# Patient Record
Sex: Male | Born: 1960
Health system: Southern US, Community
[De-identification: ages and names within clinical notes are randomized; demographics above are authoritative.]

## PROBLEM LIST (undated history)

## (undated) DIAGNOSIS — S8990XA Unspecified injury of unspecified lower leg, initial encounter: Secondary | ICD-10-CM

## (undated) DIAGNOSIS — H269 Unspecified cataract: Secondary | ICD-10-CM

## (undated) DIAGNOSIS — E7800 Pure hypercholesterolemia, unspecified: Secondary | ICD-10-CM

## (undated) DIAGNOSIS — I10 Essential (primary) hypertension: Secondary | ICD-10-CM

## (undated) DIAGNOSIS — R011 Cardiac murmur, unspecified: Secondary | ICD-10-CM

## (undated) DIAGNOSIS — B019 Varicella without complication: Secondary | ICD-10-CM

## (undated) DIAGNOSIS — T753XXA Motion sickness, initial encounter: Secondary | ICD-10-CM

## (undated) DIAGNOSIS — E1144 Type 2 diabetes mellitus with diabetic amyotrophy: Secondary | ICD-10-CM

## (undated) DIAGNOSIS — K259 Gastric ulcer, unspecified as acute or chronic, without hemorrhage or perforation: Secondary | ICD-10-CM

## (undated) DIAGNOSIS — G629 Polyneuropathy, unspecified: Secondary | ICD-10-CM

## (undated) DIAGNOSIS — E119 Type 2 diabetes mellitus without complications: Secondary | ICD-10-CM

## (undated) HISTORY — DX: Cardiac murmur, unspecified: R01.1

## (undated) HISTORY — PX: OTHER SURGICAL HISTORY: SHX169

## (undated) HISTORY — DX: Type 2 diabetes mellitus with diabetic amyotrophy: E11.44

## (undated) HISTORY — DX: Unspecified cataract: H26.9

## (undated) HISTORY — DX: Varicella without complication: B01.9

## (undated) HISTORY — DX: Gastric ulcer, unspecified as acute or chronic, without hemorrhage or perforation: K25.9

## (undated) HISTORY — DX: Essential (primary) hypertension: I10

---

## 2006-02-03 ENCOUNTER — Emergency Department (HOSPITAL_COMMUNITY): Admission: EM | Admit: 2006-02-03 | Discharge: 2006-02-03 | Payer: Self-pay | Admitting: Emergency Medicine

## 2016-01-19 ENCOUNTER — Encounter: Payer: Self-pay | Admitting: Family Medicine

## 2016-01-19 ENCOUNTER — Ambulatory Visit (INDEPENDENT_AMBULATORY_CARE_PROVIDER_SITE_OTHER): Payer: Self-pay | Admitting: Family Medicine

## 2016-01-19 VITALS — BP 154/86 | HR 93 | Temp 98.2°F | Ht 70.0 in | Wt 169.6 lb

## 2016-01-19 DIAGNOSIS — I10 Essential (primary) hypertension: Secondary | ICD-10-CM | POA: Insufficient documentation

## 2016-01-19 DIAGNOSIS — R030 Elevated blood-pressure reading, without diagnosis of hypertension: Secondary | ICD-10-CM

## 2016-01-19 DIAGNOSIS — IMO0001 Reserved for inherently not codable concepts without codable children: Secondary | ICD-10-CM

## 2016-01-19 DIAGNOSIS — H269 Unspecified cataract: Secondary | ICD-10-CM

## 2016-01-19 DIAGNOSIS — M25562 Pain in left knee: Secondary | ICD-10-CM

## 2016-01-19 LAB — COMPREHENSIVE METABOLIC PANEL
ALT: 13 U/L (ref 0–53)
AST: 12 U/L (ref 0–37)
Albumin: 4.3 g/dL (ref 3.5–5.2)
Alkaline Phosphatase: 114 U/L (ref 39–117)
BUN: 13 mg/dL (ref 6–23)
CO2: 29 mEq/L (ref 19–32)
Calcium: 9.5 mg/dL (ref 8.4–10.5)
Chloride: 99 mEq/L (ref 96–112)
Creatinine, Ser: 0.61 mg/dL (ref 0.40–1.50)
GFR: 177.87 mL/min (ref >60.00)
Glucose, Bld: 195 mg/dL — ABNORMAL HIGH (ref 70–99)
Potassium: 4 mEq/L (ref 3.5–5.1)
Sodium: 137 mEq/L (ref 135–145)
Total Bilirubin: 0.7 mg/dL (ref 0.2–1.2)
Total Protein: 7.3 g/dL (ref 6.0–8.3)

## 2016-01-19 LAB — LIPID PANEL
Cholesterol: 197 mg/dL (ref 0–200)
HDL: 45 mg/dL (ref >39.00)
LDL Cholesterol: 130 mg/dL — ABNORMAL HIGH (ref 0–99)
NonHDL: 151.63
Total CHOL/HDL Ratio: 4
Triglycerides: 110 mg/dL (ref 0.0–149.0)
VLDL: 22 mg/dL (ref 0.0–40.0)

## 2016-01-19 LAB — CBC
HCT: 47.9 % (ref 39.0–52.0)
Hemoglobin: 16.3 g/dL (ref 13.0–17.0)
MCHC: 33.9 g/dL (ref 30.0–36.0)
MCV: 86.1 fl (ref 78.0–100.0)
Platelets: 209 10*3/uL (ref 150.0–400.0)
RBC: 5.57 Mil/uL (ref 4.22–5.81)
RDW: 12.7 % (ref 11.5–15.5)
WBC: 6 10*3/uL (ref 4.0–10.5)

## 2016-01-19 LAB — TSH: TSH: 1.68 u[IU]/mL (ref 0.35–4.50)

## 2016-01-19 LAB — HEMOGLOBIN A1C: Hgb A1c MFr Bld: 8.8 % — ABNORMAL HIGH (ref 4.6–6.5)

## 2016-01-19 NOTE — Assessment & Plan Note (Signed)
Patient is being followed by ophthalmology for this. Surgery is planned for next month.

## 2016-01-19 NOTE — Patient Instructions (Signed)
Nice to meet you. We will refer you to sports medicine for evaluation of your left knee. We will call you when this is scheduled. We will have a follow-up in a week to check your blood pressure. We'll check some lab work today as well. Please keep the appointment for your cataract surgery. If you develop worsening pain in her knee or leg, numbness, weakness, chest pain, shortness of breath, vision changes, or any new or changing symptoms please seek medical attention.

## 2016-01-19 NOTE — Assessment & Plan Note (Signed)
Patient with persistent left knee pain status post fall resulting in injury. He has apparent weakness of his left quadriceps muscle and left hip flexors through certain ranges of motion making me wonder if there is some sort of muscle tear in his left quadriceps. He has no other neurological issues. He denies back pain or back injury. His knee appears to be ligamentously intact. I discussed given the exam findings that it would be worthwhile having him see a sports medicine physician who can perform an ultrasound to evaluate this issue further. We will refer to sports medicine and try to get him in later this week. He is given return precautions.

## 2016-01-19 NOTE — Assessment & Plan Note (Signed)
Patient with elevated blood pressure in the office today. He is asymptomatic. We will check lab work today. We will request records from his recent ophthalmology visit to see if his blood pressure was indeed elevated at that time. If it was we will start him on medication. If it was not we will have him return in 1 week for a nurse visit for blood pressure check and if elevated at that time we will start him on blood pressure medication. Given return precautions.

## 2016-01-19 NOTE — Progress Notes (Signed)
Patient ID: Michael Montgomery, male   DOB: 18-Jan-1961, 55 y.o.   MRN: [id number]  [name], MD Phone: [phone]  Michael Montgomery is a 55 y.o. male who presents today for new patient visit.  Left knee pain: Patient notes about a month ago he slipped while he was walking and his left knee went into a valgus direction and curled under his weight. He notes since that time the front of his knee has hurt as well as his distal quadriceps. Notes the knee will occasionally swell. Hurts the most when he is laying down. No prior injury to his knee. No pop, locking, or catching of his knee. He tried Tylenol and that has not helped very much. He notes no numbness in his leg. He notes difficulty fully extending his knee due to pain and possibly decreased strength in his left quad.  Elevated blood pressure: Patient notes in the past he's had high blood pressure. Recently was elevated at a ophthalmology appointment. He has not previously been on medications. He notes elevated blood pressure runs in his family. No chest pain, shortness breath, or edema.  Cataracts: Patient notes he is seen an ophthalmologist at [facility] ophthalmology. Notes he is scheduled to have cataract removal on April 10. Notes he has had trouble seeing for about a year. No eye pain. He is not driving due to his trouble seeing.  Active Ambulatory Problems    Diagnosis Date Noted  . Elevated blood pressure 01/19/2016  . Cataracts, bilateral 01/19/2016  . Left knee pain 01/19/2016   Resolved Ambulatory Problems    Diagnosis Date Noted  . No Resolved Ambulatory Problems   Past Medical History  Diagnosis Date  . Chickenpox   . Gastric ulcer   . Heart murmur   . High blood pressure     Family History  Problem Relation Age of Onset  . Alcoholism Father   . Hyperlipidemia Father   . Hypertension Father   . Diabetes Mother     Social History   Social History  . Marital Status: Single    Spouse Name: N/A  . Number of  Children: N/A  . Years of Education: N/A   Occupational History  . Not on file.   Social History Main Topics  . Smoking status: Current Some Day Smoker    Types: Cigars  . Smokeless tobacco: Not on file  . Alcohol Use: 0.0 oz/week    0 Standard drinks or equivalent per week  . Drug Use: No  . Sexual Activity: Not on file   Other Topics Concern  . Not on file   Social History Narrative  . No narrative on file    ROS   General:  Negative for nexplained weight loss, fever Skin: Negative for new or changing mole, sore that won't heal HEENT: Negative for trouble hearing, trouble seeing, ringing in ears, mouth sores, hoarseness, change in voice, dysphagia. CV:  Negative for chest pain, dyspnea, edema, palpitations Resp: Negative for cough, dyspnea, hemoptysis GI: Negative for nausea, vomiting, diarrhea, constipation, abdominal pain, melena, hematochezia. GU: Positive for frequent urination, sexual difficulty, Negative for dysuria, incontinence, urinary hesitance, hematuria, vaginal or penile discharge, polyuria, lumps in testicle or breasts MSK: Positive for left knee pain, otherwise Negative for muscle cramps or aches, joint pain or swelling Neuro: Negative for headaches, weakness, numbness, dizziness, passing out/fainting Psych: Negative for depression, anxiety, memory problem, positive for stress  Objective  Physical Exam Filed Vitals:   01/19/16 1029  BP: 154/86  Pulse: 93  Temp: 98.2 F (36.8 C)    BP Readings from Last 3 Encounters:  01/19/16 154/86   Wt Readings from Last 3 Encounters:  01/19/16 169 lb 9.6 oz (76.93 kg)    Physical Exam  Constitutional: He is well-developed, well-nourished, and in no distress.  HENT:  Head: Normocephalic and atraumatic.  Right Ear: External ear normal.  Left Ear: External ear normal.  Mouth/Throat: Oropharynx is clear and moist. No oropharyngeal exudate.  Eyes: Conjunctivae are normal. Pupils are equal, round, and reactive  to light.  Neck: Neck supple.  Cardiovascular: Normal rate, regular rhythm and normal heart sounds.  Exam reveals no gallop.   No murmur heard. Pulmonary/Chest: Effort normal and breath sounds normal. No respiratory distress. He has no wheezes. He has no rales.  Abdominal: Soft. Bowel sounds are normal. He exhibits no distension. There is no tenderness. There is no rebound and no guarding.  Musculoskeletal:  Left knee with potentially mild swelling, no joint line tenderness or tenderness of the bony structures, no tenderness of the quadriceps insertion, no ligamentous laxity, there is pain on McMurray's , there is no bruising of the quadriceps muscle Right knee with no swelling, joint line tenderness, bony tenderness, quadriceps tenderness, or ligamentous laxity, negative McMurray's  Lymphadenopathy:    He has no cervical adenopathy.  Neurological: He is alert.  4 out of 5 strength through the first 20-30 of his left quadriceps strength and left hip flexors from seated position, with attempt at further range of motion actively he is unable to get past the first 20-30 of these 2 movements actively, otherwise 5/5 strength in right quads, right hip flexors, bilateral hamstrings, plantar and dorsiflexion, sensation to light touch intact in bilateral UE and LE, normal gait, 2+ patellar reflexes  Skin: Skin is warm and dry. He is not diaphoretic.  Psychiatric: Mood and affect normal.     Assessment/Plan:   Elevated blood pressure Patient with elevated blood pressure in the office today. He is asymptomatic. We will check lab work today. We will request records from his recent ophthalmology visit to see if his blood pressure was indeed elevated at that time. If it was we will start him on medication. If it was not we will have him return in 1 week for a nurse visit for blood pressure check and if elevated at that time we will start him on blood pressure medication. Given return  precautions.  Cataracts, bilateral Patient is being followed by ophthalmology for this. Surgery is planned for next month.   Left knee pain Patient with persistent left knee pain status post fall resulting in injury. He has apparent weakness of his left quadriceps muscle and left hip flexors through certain ranges of motion making me wonder if there is some sort of muscle tear in his left quadriceps. He has no other neurological issues. He denies back pain or back injury. His knee appears to be ligamentously intact. I discussed given the exam findings that it would be worthwhile having him see a sports medicine physician who can perform an ultrasound to evaluate this issue further. We will refer to sports medicine and try to get him in later this week. He is given return precautions.    Orders Placed This Encounter  Procedures  . Comp Met (CMET)  . Lipid Profile  . HgB A1c  . TSH  . CBC  . Ambulatory referral to Sports Medicine    Referral Priority:  Routine    Referral Type:  Consultation    Number of Visits Requested:  1    No orders of the defined types were placed in this encounter.     [name], MD [place]

## 2016-01-19 NOTE — Progress Notes (Signed)
Pre visit review using our clinic review tool, if applicable. No additional management support is needed unless otherwise documented below in the visit note. 

## 2016-01-21 ENCOUNTER — Encounter: Payer: Self-pay | Admitting: Family Medicine

## 2016-01-21 ENCOUNTER — Ambulatory Visit (INDEPENDENT_AMBULATORY_CARE_PROVIDER_SITE_OTHER)
Admission: RE | Admit: 2016-01-21 | Discharge: 2016-01-21 | Disposition: A | Discharge status: Home / Self Care | Payer: BLUE CROSS/BLUE SHIELD | Source: Ambulatory Visit | Attending: Family Medicine | Admitting: Family Medicine

## 2016-01-21 ENCOUNTER — Other Ambulatory Visit (INDEPENDENT_AMBULATORY_CARE_PROVIDER_SITE_OTHER): Payer: Self-pay

## 2016-01-21 ENCOUNTER — Ambulatory Visit (INDEPENDENT_AMBULATORY_CARE_PROVIDER_SITE_OTHER): Payer: Self-pay | Admitting: Family Medicine

## 2016-01-21 VITALS — BP 134/88 | HR 97 | Ht 70.0 in | Wt 170.0 lb

## 2016-01-21 DIAGNOSIS — M62569 Muscle wasting and atrophy, not elsewhere classified, unspecified lower leg: Secondary | ICD-10-CM | POA: Insufficient documentation

## 2016-01-21 DIAGNOSIS — M25562 Pain in left knee: Secondary | ICD-10-CM | POA: Diagnosis not present

## 2016-01-21 DIAGNOSIS — M62562 Muscle wasting and atrophy, not elsewhere classified, left lower leg: Secondary | ICD-10-CM

## 2016-01-21 DIAGNOSIS — R29898 Other symptoms and signs involving the musculoskeletal system: Secondary | ICD-10-CM

## 2016-01-21 MED ORDER — PREDNISONE 50 MG PO TABS: 50.0000 mg | ORAL_TABLET | Freq: Every day | ORAL | Status: DC

## 2016-01-21 NOTE — Progress Notes (Signed)
Pre visit review using our clinic review tool, if applicable. No additional management support is needed unless otherwise documented below in the visit note. 

## 2016-01-21 NOTE — Progress Notes (Signed)
[facility] [place], [place] [address] Phone: [phone] Subjective:    I'm seeing this patient by the request  of:  [name], MD   CC: Left leg pain and weakness  [id number] Michael Montgomery is a 55 y.o. male coming in with complaint of left leg pain and weakness. Patient states approximate one month ago he was walking and he tripped on his carpet. Patient feel that his leg and knee went into a valgus direction any landed on top of his leg causing to be folded up behind him. Patient states within one day he had severe amount of pain on the anterior aspect of his leg. Patient had significant swelling that seems to be from the knee up to the quadricep. Patient states any type of ambulation or weightbearing causes severe pain at this time. Patient denies any catching or locking but states that he feels like it is getting weaker. Ambulate with the aid of a cane. Denies any numbness. Patient states that he is unable to extend his leg anymore at this time.     Past Medical History  Diagnosis Date  . Chickenpox   . Gastric ulcer   . Heart murmur     When he was younger  . High blood pressure    No past surgical history on file. Social History   Social History  . Marital Status: Single    Spouse Name: N/A  . Number of Children: N/A  . Years of Education: N/A   Social History Main Topics  . Smoking status: Current Some Day Smoker    Types: Cigars  . Smokeless tobacco: None  . Alcohol Use: 0.0 oz/week    0 Standard drinks or equivalent per week  . Drug Use: No  . Sexual Activity: Not Asked   Other Topics Concern  . None   Social History Narrative   No Known Allergies Family History  Problem Relation Age of Onset  . Alcoholism Father   . Hyperlipidemia Father   . Hypertension Father   . Diabetes Mother     Past medical history, social, surgical and family history all reviewed in electronic medical record.  No  pertanent information unless stated regarding to the chief complaint.   Review of Systems: No headache, visual changes, nausea, vomiting, diarrhea, constipation, dizziness, abdominal pain, skin rash, fevers, chills, night sweats, weight loss, swollen lymph nodes, body aches, joint swelling, muscle aches, chest pain, shortness of breath, mood changes.   Objective Blood pressure 134/88, pulse 97, height 5\' 10"  (1.778 m), weight 170 lb (77.111 kg), SpO2 97 %.  General: No apparent distress alert and oriented x3 mood and affect normal, dressed appropriately.  HEENT: Pupils equal, extraocular movements intact  Respiratory: Patient's speak in full sentences and does not appear short of breath  Cardiovascular: No lower extremity edema, non tender, no erythema  Skin: Warm dry intact with no signs of infection or rash on extremities or on axial skeleton.  Abdomen: Soft nontender  Neuro: Cranial nerves II through XII are intact, neurovascularly intact in all extremities with 2+ DTRs and 2+ pulses.  Lymph: No lymphadenopathy of posterior or anterior cervical chain or axillae bilaterally.  Gait severely antalgic gait patient shows weakness with standing. MSK:  Non tender with full range of motion and good stability and symmetric strength and tone of shoulders, elbows, wrist, hip, and ankles bilaterally.  Knee: Left Patient has significant atrophy of the left leg compared to the  right leg mostly of the femur as well as the calf muscle. Patient appears to be neurovascularly intact. Patient has no extensor mechanism noted. Generalized tenderness of the femur itself. No defect noted of the quadricep. Quadricep tendon as well as patellar tendon seems to be intact. Patella is midline. Ligaments with solid consistent endpoints including ACL, PCL, LCL, MCL.  MSK US performed of: Left knee This study was ordered, performed, and interpreted by Charlann Boxer D.O.  Knee: All structures visualized. Mild degenerative  changes noted of the meniscus but no true acute tear or displacement noted Patellar Tendon unremarkable on long and transverse views without effusion. August the tendon appears to be intact as well as. Patient does not have a sign of a quadricep tear that can be appreciated at this time. Mild atrophy of the muscle. No abnormality of prepatellar bursa. LCL and MCL unremarkable on long and transverse views. No abnormality of origin of medial or lateral head of the gastrocnemius.  IMPRESSION:  Quadricep and patellar tendon appear to be intact with some mild atrophy of the quadricep.     Impression and Recommendations:     This case required medical decision making of moderate complexity.      Note: This dictation was prepared with Dragon dictation along with smaller phrase technology. Any transcriptional errors that result from this process are unintentional.

## 2016-01-21 NOTE — Assessment & Plan Note (Signed)
Trace effusion of the knee noted. Likely this is more from compensation for using the knee. Patient though has weakness of the extensor mechanism. Does have full strength with flexion. Patient was also having weakness at the ankle. I think that this is more neurologic. X-rays to rule out any other bony abnormality. Patient started on prednisone and wants to hold on the gabapentin at this time. Depending on results we will see what will be the next treatment options and the week.

## 2016-01-21 NOTE — Patient Instructions (Signed)
Good to see you  I am not sure what is going on at this time.  Stay walking but careful.  We need xrays of your knee, back an dleg today  I need to see you again in 1 week to make sure we are making progress or we may need more imaging Show up front: OK to double book.

## 2016-01-21 NOTE — Assessment & Plan Note (Signed)
Patient does have muscle wasting noted. Quadricep tendon seems to be intact. Patient though is having difficulty even lifting his leg.  Think that this could be more neurologic the musculature based on exam today. X-rays the patient's back, femur, and knee are all taken today. Patient given prednisone to see if he responds. I do think that advance imaging may be warranted as well as an EMG for neurologic aspect. I do not see any signs of an avascular necrosis. Patient's only has weakness of the lower extremity. No signs of any type of stroke the patient does have a history of hypertension. We will continue to monitor. Patient then will come back in the next week and we will further evaluate and see what would be the next regimen based on him and his response to treatment. Patient will be out of work for another week.

## 2016-01-22 ENCOUNTER — Other Ambulatory Visit: Payer: Self-pay | Admitting: Family Medicine

## 2016-01-22 ENCOUNTER — Telehealth: Payer: Self-pay | Admitting: Family Medicine

## 2016-01-22 MED ORDER — METFORMIN HCL 500 MG PO TABS: 500.0000 mg | ORAL_TABLET | Freq: Two times a day (BID) | ORAL | Status: DC

## 2016-01-22 MED ORDER — ATORVASTATIN CALCIUM 40 MG PO TABS: 40.0000 mg | ORAL_TABLET | Freq: Every day | ORAL | Status: DC

## 2016-01-26 ENCOUNTER — Telehealth: Payer: Self-pay | Admitting: *Deleted

## 2016-01-26 ENCOUNTER — Ambulatory Visit (INDEPENDENT_AMBULATORY_CARE_PROVIDER_SITE_OTHER): Payer: BLUE CROSS/BLUE SHIELD

## 2016-01-26 VITALS — BP 144/78 | HR 104

## 2016-01-26 DIAGNOSIS — E119 Type 2 diabetes mellitus without complications: Secondary | ICD-10-CM

## 2016-01-26 DIAGNOSIS — I10 Essential (primary) hypertension: Secondary | ICD-10-CM | POA: Diagnosis not present

## 2016-01-26 MED ORDER — AMLODIPINE BESYLATE 5 MG PO TABS: 5.0000 mg | ORAL_TABLET | Freq: Every day | ORAL | Status: DC

## 2016-01-26 NOTE — Telephone Encounter (Signed)
Patient is currently on Viagra, please advise for prescription.   Please advise on referral for podiatry.    Thanks

## 2016-01-26 NOTE — Progress Notes (Signed)
Patient came in for a BP check. This was preformed on both arms. Patient tolerated well. Patient is not on medication for BP.

## 2016-01-26 NOTE — Telephone Encounter (Signed)
Patient stated that he's a diabetic and would like to know of a place to get his toenails clipped. He would also like to have a prescription for Viagra.  Pharmacy [facility] [place]  Pt contact [phone]

## 2016-01-26 NOTE — Addendum Note (Signed)
Addended by: Caryl Bis ERIC G on: 01/26/2016 12:29 PM   Modules accepted: Orders, Level of Service

## 2016-01-26 NOTE — Progress Notes (Signed)
Patient informed of medication.

## 2016-01-26 NOTE — Progress Notes (Signed)
Patient's blood pressure still elevated. We should start him on a low dose amlodipine. This is sent to his pharmacy. Please inform of this. Thanks.

## 2016-01-28 ENCOUNTER — Ambulatory Visit (INDEPENDENT_AMBULATORY_CARE_PROVIDER_SITE_OTHER): Payer: BLUE CROSS/BLUE SHIELD | Admitting: Family Medicine

## 2016-01-28 ENCOUNTER — Encounter: Payer: Self-pay | Admitting: Family Medicine

## 2016-01-28 ENCOUNTER — Encounter: Payer: Self-pay | Admitting: *Deleted

## 2016-01-28 VITALS — BP 146/84 | HR 92 | Wt 168.0 lb

## 2016-01-28 DIAGNOSIS — M62569 Muscle wasting and atrophy, not elsewhere classified, unspecified lower leg: Secondary | ICD-10-CM | POA: Diagnosis not present

## 2016-01-28 DIAGNOSIS — R29898 Other symptoms and signs involving the musculoskeletal system: Secondary | ICD-10-CM

## 2016-01-28 NOTE — Assessment & Plan Note (Signed)
Patient has significant atrophy of the left leg as well as weakness with hip flexion as well as knee extension. This corresponds with possible L1-L2 nerve root impingement. In addition of this could be possibly early avascular necrosis the patient has fairly good range of motion of the hip. Patient's knee is nontender on exam and I do not think patient should have any problems with hip flexion if this was more of any pathology. Patient is not improving and if anything seems to be worsening. At this point I do think that advance imaging is warranted. MRI of the left hip to rule out the avascular necrosis an MRI of the lumbar spine to evaluate for any nerve root impingement or any type of infectious etiology or other pathology that can be giving [place] this weakness. Patient's quadricep distally on ultrasound is intact.

## 2016-01-28 NOTE — Patient Instructions (Addendum)
Good to see you  Ice is your friend Concern you have something in your back causing the problem.  I am also concern for something in your hip called avascular necrosis.  We will get MRI of your back and your hip to see what is going on See me again 1-2 days after MRI and we will discuss.

## 2016-01-28 NOTE — Progress Notes (Signed)
[facility] [place], [place] [address] Phone: [phone] Subjective:    I'm seeing this patient by the request  of:  [name], MD   CC: Left leg pain and weakness follow-up  [id number] Michael Montgomery is a 55 y.o. male coming in with complaint of left leg pain and weakness. Patient initially had an injury that seemed to be traumatic. Patient was seen the patient did have significant muscle wasting as well as left leg weakness. Patient's pain seemed to be over the quadriceps but on ultrasound we do not see any true tear appreciated. There is concern that this is coming from his back. Patient was given medications to see if this would benefit him. It has been one week.the medication was prednisone. Patient states  patient did have labs and initial presentation with primary care provider that did not show any type of elevation in white blood cell count or anything significantly abnormal. Patient states that the prednisone me and health 5-10%. Continues to have significant weakness which is what he is more concerned about. Does not seem to be improving at this time. Even ambulation is difficult. X-rays after last exam were independently visualized by me. Patient had very minimal arthritic changes of the knee itself.patient's hip exam was fairly unremarkable.patient does have known bilateral L5-S1 facet degenerative changes and some vascular calcifications in the area.  Past Medical History  Diagnosis Date  . Chickenpox   . Gastric ulcer   . High blood pressure   . Diabetes mellitus without complication ([place])   . Heart murmur     When he was younger  . Hypercholesteremia   . Knee injury     left  . Motion sickness     boats   Past Surgical History  Procedure Laterality Date  . No past surgeries     Social History   Social History  . Marital Status: Married    Spouse Name: N/A  . Number of Children: N/A  . Years of Education:  N/A   Social History Main Topics  . Smoking status: Current Some Day Smoker    Types: Cigars  . Smokeless tobacco: None  . Alcohol Use: 0.0 oz/week    0 Standard drinks or equivalent per week     Comment: rare  . Drug Use: No  . Sexual Activity: Not Asked   Other Topics Concern  . None   Social History Narrative   No Known Allergies Family History  Problem Relation Age of Onset  . Alcoholism Father   . Hyperlipidemia Father   . Hypertension Father   . Diabetes Mother     Past medical history, social, surgical and family history all reviewed in electronic medical record.  No pertanent information unless stated regarding to the chief complaint.   Review of Systems: No headache, visual changes, nausea, vomiting, diarrhea, constipation, dizziness, abdominal pain, skin rash, fevers, chills, night sweats, weight loss, swollen lymph nodes, body aches, joint swelling, muscle aches, chest pain, shortness of breath, mood changes.   Objective Blood pressure 146/84, pulse 92, weight 168 lb (76.204 kg).  General: No apparent distress alert and oriented x3 mood and affect normal, dressed appropriately.  HEENT: Pupils equal, extraocular movements intact  Respiratory: Patient's speak in full sentences and does not appear short of breath  Cardiovascular: No lower extremity edema, non tender, no erythema  Skin: Warm dry intact with no signs of infection or rash on extremities or on axial  skeleton.  Abdomen: Soft nontender  Neuro: Cranial nerves II through XII are intact, neurovascularly intact in all extremities with 2+ DTRs and 2+ pulses.  Lymph: No lymphadenopathy of posterior or anterior cervical chain or axillae bilaterally.  Gait severely antalgic gait patient shows weakness with standing. MSK:  Non tender with full range of motion and good stability and symmetric strength and tone of shoulders, elbows, wrist, hip, and ankles bilaterally.  Knee: Left Patient has significant atrophy of  the left leg compared to the right leg mostly of the femur as well as the calf muscle. Patient appears to be neurovascularly intact.  Patient has 2 out of 5 strength with hip flexion on the left side as well as 2 out of 5 strength of knee extension. Patient otherwise has full strength of the extremity with dorsiflexion and plantarflexion.     Impression and Recommendations:     This case required medical decision making of moderate complexity.      Note: This dictation was prepared with Dragon dictation along with smaller phrase technology. Any transcriptional errors that result from this process are unintentional.

## 2016-01-29 ENCOUNTER — Encounter: Payer: Self-pay | Admitting: Family Medicine

## 2016-01-29 NOTE — Telephone Encounter (Signed)
Podiatry referral placed. I would like to see the patient back in the office prior to prescribing Viagra to discuss this further. Thanks.

## 2016-01-29 NOTE — Discharge Instructions (Signed)

## 2016-01-29 NOTE — Telephone Encounter (Signed)
Spoke with the patient.  Scheduled for a follow up for viagra for next Thursday to discuss.  Thanks

## 2016-02-01 ENCOUNTER — Ambulatory Visit
Admission: RE | Admit: 2016-02-01 | Discharge: 2016-02-01 | Disposition: A | Discharge status: Home / Self Care | Payer: BLUE CROSS/BLUE SHIELD | Source: Ambulatory Visit | Attending: Ophthalmology | Admitting: Ophthalmology

## 2016-02-01 ENCOUNTER — Encounter
Admission: RE | Disposition: A | Discharge status: Home / Self Care | Payer: Self-pay | Source: Ambulatory Visit | Attending: Ophthalmology

## 2016-02-01 ENCOUNTER — Ambulatory Visit: Payer: BLUE CROSS/BLUE SHIELD | Admitting: Anesthesiology

## 2016-02-01 DIAGNOSIS — E119 Type 2 diabetes mellitus without complications: Secondary | ICD-10-CM | POA: Diagnosis not present

## 2016-02-01 DIAGNOSIS — H268 Other specified cataract: Secondary | ICD-10-CM | POA: Diagnosis not present

## 2016-02-01 DIAGNOSIS — I10 Essential (primary) hypertension: Secondary | ICD-10-CM | POA: Insufficient documentation

## 2016-02-01 DIAGNOSIS — F17210 Nicotine dependence, cigarettes, uncomplicated: Secondary | ICD-10-CM | POA: Insufficient documentation

## 2016-02-01 DIAGNOSIS — E7800 Pure hypercholesterolemia, unspecified: Secondary | ICD-10-CM | POA: Insufficient documentation

## 2016-02-01 DIAGNOSIS — M7989 Other specified soft tissue disorders: Secondary | ICD-10-CM | POA: Insufficient documentation

## 2016-02-01 HISTORY — DX: Motion sickness, initial encounter: T75.3XXA

## 2016-02-01 HISTORY — DX: Unspecified injury of unspecified lower leg, initial encounter: S89.90XA

## 2016-02-01 HISTORY — PX: CATARACT EXTRACTION W/PHACO: SHX586

## 2016-02-01 HISTORY — DX: Pure hypercholesterolemia, unspecified: E78.00

## 2016-02-01 HISTORY — DX: Type 2 diabetes mellitus without complications: E11.9

## 2016-02-01 LAB — GLUCOSE, CAPILLARY
Glucose-Capillary: 189 mg/dL — ABNORMAL HIGH (ref 65–99)
Glucose-Capillary: 169 mg/dL — ABNORMAL HIGH (ref 65–99)

## 2016-02-01 SURGERY — PHACOEMULSIFICATION, CATARACT, WITH IOL INSERTION
Anesthesia: Monitor Anesthesia Care | Site: Eye | Laterality: Right | Wound class: Clean

## 2016-02-01 MED ORDER — BRIMONIDINE TARTRATE 0.2 % OP SOLN
OPHTHALMIC | Status: DC | PRN
  Administered 2016-02-01: 1 [drp] via OPHTHALMIC

## 2016-02-01 MED ORDER — TETRACAINE HCL 0.5 % OP SOLN
1.0000 [drp] | OPHTHALMIC | Status: DC | PRN
  Administered 2016-02-01: 1 [drp] via OPHTHALMIC

## 2016-02-01 MED ORDER — POVIDONE-IODINE 5 % OP SOLN
1.0000 | OPHTHALMIC | Status: DC | PRN
Start: 2016-02-01 — End: 2016-02-01
  Administered 2016-02-01: 1 via OPHTHALMIC

## 2016-02-01 MED ORDER — LACTATED RINGERS IV SOLN: INTRAVENOUS | Status: DC

## 2016-02-01 MED ORDER — TIMOLOL MALEATE 0.5 % OP SOLN
OPHTHALMIC | Status: DC | PRN
  Administered 2016-02-01: 1 [drp] via OPHTHALMIC

## 2016-02-01 MED ORDER — MIDAZOLAM HCL 2 MG/2ML IJ SOLN
INTRAMUSCULAR | Status: DC | PRN
  Administered 2016-02-01: 2 mg via INTRAVENOUS

## 2016-02-01 MED ORDER — ACETAMINOPHEN 160 MG/5ML PO SOLN: 325.0000 mg | ORAL | Status: DC | PRN

## 2016-02-01 MED ORDER — FENTANYL CITRATE (PF) 100 MCG/2ML IJ SOLN
INTRAMUSCULAR | Status: DC | PRN
  Administered 2016-02-01 (×2): 50 ug via INTRAVENOUS

## 2016-02-01 MED ORDER — ACETAMINOPHEN 325 MG PO TABS: 325.0000 mg | ORAL_TABLET | ORAL | Status: DC | PRN

## 2016-02-01 MED ORDER — CEFUROXIME OPHTHALMIC INJECTION 1 MG/0.1 ML
INJECTION | OPHTHALMIC | Status: DC | PRN
  Administered 2016-02-01: 0.1 mL via INTRACAMERAL

## 2016-02-01 MED ORDER — ARMC OPHTHALMIC DILATING GEL
1.0000 "application " | OPHTHALMIC | Status: DC | PRN
  Administered 2016-02-01 (×2): 1 via OPHTHALMIC

## 2016-02-01 MED ORDER — NA HYALUR & NA CHOND-NA HYALUR 0.4-0.35 ML IO KIT
PACK | INTRAOCULAR | Status: DC | PRN
  Administered 2016-02-01: 1 mL via INTRAOCULAR

## 2016-02-01 MED ORDER — EPINEPHRINE HCL 1 MG/ML IJ SOLN
INTRAOCULAR | Status: DC | PRN
  Administered 2016-02-01: 33 mL via OPHTHALMIC

## 2016-02-01 MED ORDER — BALANCED SALT IO SOLN
INTRAOCULAR | Status: DC | PRN
  Administered 2016-02-01: 1 mL via OPHTHALMIC

## 2016-02-01 SURGICAL SUPPLY — 28 items
APPLICATOR COTTON TIP 3IN (MISCELLANEOUS) ×2 IMPLANT
CANNULA ANT/CHMB 27GA (MISCELLANEOUS) ×2 IMPLANT
DISSECTOR HYDRO NUCLEUS 50X22 (MISCELLANEOUS) ×2 IMPLANT
GLOVE BIO SURGEON STRL SZ7 (GLOVE) ×2 IMPLANT
GLOVE SURG LX 6.5 MICRO (GLOVE) ×1
GLOVE SURG LX STRL 6.5 MICRO (GLOVE) ×1 IMPLANT
GOWN STRL REUS W/ TWL LRG LVL3 (GOWN DISPOSABLE) ×2 IMPLANT
GOWN STRL REUS W/TWL LRG LVL3 (GOWN DISPOSABLE) ×2
LENS IOL TECNIS ITEC 23.5 (Intraocular Lens) ×2 IMPLANT
MARKER SKIN DUAL TIP RULER LAB (MISCELLANEOUS) ×2 IMPLANT
NEEDLE FILTER BLUNT 18X 1/2SAF (NEEDLE) ×1
NEEDLE FILTER BLUNT 18X1 1/2 (NEEDLE) ×1 IMPLANT
PACK CATARACT BRASINGTON (MISCELLANEOUS) ×2 IMPLANT
PACK EYE AFTER SURG (MISCELLANEOUS) ×2 IMPLANT
PACK OPTHALMIC (MISCELLANEOUS) ×2 IMPLANT
RING MALYGIN 7.0 (MISCELLANEOUS) IMPLANT
SOL BAL SALT 15ML (MISCELLANEOUS)
SOLUTION BAL SALT 15ML (MISCELLANEOUS) IMPLANT
SUT ETHILON 10-0 CS-B-6CS-B-6 (SUTURE)
SUT VICRYL  9 0 (SUTURE)
SUT VICRYL 9 0 (SUTURE) IMPLANT
SUTURE EHLN 10-0 CS-B-6CS-B-6 (SUTURE) IMPLANT
SYR 3ML LL SCALE MARK (SYRINGE) ×2 IMPLANT
SYR TB 1ML LUER SLIP (SYRINGE) ×2 IMPLANT
WATER STERILE IRR 250ML POUR (IV SOLUTION) ×2 IMPLANT
WATER STERILE IRR 500ML POUR (IV SOLUTION) IMPLANT
WICK EYE OCUCEL (MISCELLANEOUS) IMPLANT
WIPE NON LINTING 3.25X3.25 (MISCELLANEOUS) ×2 IMPLANT

## 2016-02-01 NOTE — Transfer of Care (Signed)
Immediate Anesthesia Transfer of Care Note  Patient: Michael Montgomery  Procedure(s) Performed: Procedure(s) with comments: CATARACT EXTRACTION PHACO AND INTRAOCULAR LENS PLACEMENT (IOC) right (Right) - DIABETIC - oral meds  Patient Location: PACU  Anesthesia Type: MAC  Level of Consciousness: awake, alert  and patient cooperative  Airway and Oxygen Therapy: Patient Spontanous Breathing and Patient connected to supplemental oxygen  Post-op Assessment: Post-op Vital signs reviewed, Patient's Cardiovascular Status Stable, Respiratory Function Stable, Patent Airway and No signs of Nausea or vomiting  Post-op Vital Signs: Reviewed and stable  Complications: No apparent anesthesia complications

## 2016-02-01 NOTE — Anesthesia Procedure Notes (Signed)
Procedure Name: MAC Performed by: Loretha Ure Pre-anesthesia Checklist: Patient identified, Emergency Drugs available, Suction available, Timeout performed and Patient being monitored Patient Re-evaluated:Patient Re-evaluated prior to inductionOxygen Delivery Method: Nasal cannula Placement Confirmation: positive ETCO2     

## 2016-02-01 NOTE — Anesthesia Postprocedure Evaluation (Signed)
Anesthesia Post Note  Patient: Michael Montgomery  Procedure(s) Performed: Procedure(s) (LRB): CATARACT EXTRACTION PHACO AND INTRAOCULAR LENS PLACEMENT (IOC) right (Right)  Patient location during evaluation: PACU Anesthesia Type: MAC Level of consciousness: awake and alert Pain management: pain level controlled Vital Signs Assessment: post-procedure vital signs reviewed and stable Respiratory status: spontaneous breathing and respiratory function stable Cardiovascular status: stable Anesthetic complications: no    Gae Bihl, III,  Ples Trudel D

## 2016-02-01 NOTE — Op Note (Signed)
Date of Surgery: 02/01/2016  PREOPERATIVE DIAGNOSES: Visually significant posterior subcapsular cataract, right eye.  POSTOPERATIVE DIAGNOSES: Same  PROCEDURES PERFORMED: Cataract extraction with intraocular lens implant, right eye.  SURGEON: Almon Hercules, M.D.  ANESTHESIA: MAC and topical  IMPLANTS: PCB00 +23.5 D  Implant Name Type Inv. Item Serial No. Manufacturer Lot No. LRB No. Used  technis aspheric iol pre loaded     [id number] [facility]   Right 1     COMPLICATIONS: None.  DESCRIPTION OF PROCEDURE: Therapeutic options were discussed with the patient preoperatively, including a discussion of risks and benefits of surgery. Informed consent was obtained. An IOL-Master and immersion biometry were used to take the lens measurements, and a dilated fundus exam was performed within 6 months of the surgical date.  The patient was premedicated and brought to the operating room and placed on the operating table in the supine position. After adequate anesthesia, the patient was prepped and draped in the usual sterile ophthalmic fashion. A wire lid speculum was inserted and the microscope was positioned. A Superblade was used to create a paracentesis site at the limbus and a small amount of dilute preservative free lidocaine was instilled into the anterior chamber, followed by dispersive viscoelastic. A clear corneal incision was created temporally using a 2.4 mm keratome blade. Capsulorrhexis was then performed. In situ phacoemulsification was performed.  Cortical material was removed with the irrigation-aspiration unit. Dispersive viscoelastic was instilled to open the capsular bag. A posterior chamber intraocular lens with the specifications above was inserted and positioned. Irrigation-aspiration was used to remove all viscoelastic. Cefuroxime 1cc was instilled into the anterior chamber, and the corneal incision was checked and found to be water tight. The eyelid speculum was removed.  The  operative eye was covered with protective goggles after instilling 1 drop of timolol and brimonidine. The patient tolerated the procedure well. There were no complications.

## 2016-02-01 NOTE — Anesthesia Preprocedure Evaluation (Signed)
Anesthesia Evaluation  Patient identified by MRN, date of birth, ID band Patient awake    Reviewed: Allergy & Precautions, H&P , Patient's Chart, lab work & pertinent test results  Airway Mallampati: II  TM Distance: >3 FB Neck ROM: full    Dental   Pulmonary Current Smoker,    Pulmonary exam normal breath sounds clear to auscultation       Cardiovascular hypertension, On Medications Normal cardiovascular exam     Neuro/Psych    GI/Hepatic negative GI ROS, Neg liver ROS,   Endo/Other  diabetes, Well Controlled, Type 2  Renal/GU      Musculoskeletal   Abdominal   Peds  Hematology negative hematology ROS (+)   Anesthesia Other Findings   Reproductive/Obstetrics                             Anesthesia Physical Anesthesia Plan  ASA: II  Anesthesia Plan: MAC   Post-op Pain Management:    Induction:   Airway Management Planned:   Additional Equipment:   Intra-op Plan:   Post-operative Plan:   Informed Consent: I have reviewed the patients History and Physical, chart, labs and discussed the procedure including the risks, benefits and alternatives for the proposed anesthesia with the patient or authorized representative who has indicated his/her understanding and acceptance.     Plan Discussed with: CRNA  Anesthesia Plan Comments:         Anesthesia Quick Evaluation

## 2016-02-01 NOTE — H&P (Signed)
H+P reviewed and is up to date, please see paper chart.  

## 2016-02-02 ENCOUNTER — Encounter: Payer: Self-pay | Admitting: Ophthalmology

## 2016-02-03 ENCOUNTER — Ambulatory Visit
Admission: RE | Admit: 2016-02-03 | Discharge: 2016-02-03 | Disposition: A | Discharge status: Home / Self Care | Payer: BLUE CROSS/BLUE SHIELD | Source: Ambulatory Visit | Attending: Family Medicine | Admitting: Family Medicine

## 2016-02-03 DIAGNOSIS — R29898 Other symptoms and signs involving the musculoskeletal system: Secondary | ICD-10-CM

## 2016-02-04 ENCOUNTER — Encounter: Payer: Self-pay | Admitting: Family Medicine

## 2016-02-04 ENCOUNTER — Other Ambulatory Visit: Payer: Self-pay

## 2016-02-04 ENCOUNTER — Telehealth: Payer: Self-pay

## 2016-02-04 ENCOUNTER — Ambulatory Visit (INDEPENDENT_AMBULATORY_CARE_PROVIDER_SITE_OTHER): Payer: BLUE CROSS/BLUE SHIELD | Admitting: Family Medicine

## 2016-02-04 VITALS — BP 136/82 | HR 99 | Temp 98.6°F | Ht 70.0 in | Wt 168.4 lb

## 2016-02-04 DIAGNOSIS — E119 Type 2 diabetes mellitus without complications: Secondary | ICD-10-CM

## 2016-02-04 DIAGNOSIS — R29898 Other symptoms and signs involving the musculoskeletal system: Secondary | ICD-10-CM

## 2016-02-04 DIAGNOSIS — N500 Atrophy of testis: Secondary | ICD-10-CM

## 2016-02-04 DIAGNOSIS — I10 Essential (primary) hypertension: Secondary | ICD-10-CM

## 2016-02-04 DIAGNOSIS — Z1211 Encounter for screening for malignant neoplasm of colon: Secondary | ICD-10-CM | POA: Diagnosis not present

## 2016-02-04 DIAGNOSIS — N529 Male erectile dysfunction, unspecified: Secondary | ICD-10-CM

## 2016-02-04 DIAGNOSIS — B351 Tinea unguium: Secondary | ICD-10-CM | POA: Diagnosis not present

## 2016-02-04 LAB — TESTOSTERONE: Testosterone: 298.54 ng/dL — ABNORMAL LOW (ref 300.00–890.00)

## 2016-02-04 MED ORDER — SILDENAFIL CITRATE 50 MG PO TABS: 50.0000 mg | ORAL_TABLET | Freq: Every day | ORAL | Status: DC | PRN

## 2016-02-04 MED ORDER — METFORMIN HCL 500 MG PO TABS: ORAL_TABLET | ORAL | Status: DC

## 2016-02-04 MED ORDER — AMLODIPINE BESYLATE 10 MG PO TABS: 10.0000 mg | ORAL_TABLET | Freq: Every day | ORAL | Status: DC

## 2016-02-04 NOTE — Telephone Encounter (Signed)
Dr. Tamala Julian called his wife at patient's request. Discussed MRI results, ordering of EMG, and referral to neuro.  Discussed need to go to ER if symptoms worsen and to seek immediate neuro consult.

## 2016-02-04 NOTE — Patient Instructions (Addendum)
Nice to see you. We are going to check a testosterone today. We will also check your urine for protein. We have referred you her to podiatry and GI.  Please increase amlodipine to 10 mg daily. You should take two 5 mg tablets daily until you run out and then fill the new prescription. You should increase your metformin 1000 mg in the morning and leave it at 500 mg at night. Please try the Viagra for erectile dysfunction. If you have to go to the emergency room or call EMS for any reason and you have taken this in the last several days please inform them of this. If you develop chest pain while being physically active or sexually active please stop the activity and call 911. If you have chest pain, shortness of breath, or any new or changing symptoms please seek medical attention.

## 2016-02-04 NOTE — Telephone Encounter (Signed)
Spoke with patient and discussed MRI results and Dr. Thompson Caul recommendation to do EMG. Patient agreed to proceeding with test. Patient asked [place] to call his wife as well to explain things for him.  He also states he is having increased weakness and is having trouble putting pressure on that leg now.

## 2016-02-04 NOTE — Progress Notes (Signed)
Pre visit review using our clinic review tool, if applicable. No additional management support is needed unless otherwise documented below in the visit note. 

## 2016-02-05 LAB — MICROALBUMIN / CREATININE URINE RATIO
Creatinine, Urine: 127 mg/dL (ref 20–370)
Microalb Creat Ratio: 30 mcg/mg creat — ABNORMAL HIGH (ref <30)
Microalb, Ur: 3.8 mg/dL

## 2016-02-07 DIAGNOSIS — N529 Male erectile dysfunction, unspecified: Secondary | ICD-10-CM | POA: Insufficient documentation

## 2016-02-07 DIAGNOSIS — E119 Type 2 diabetes mellitus without complications: Secondary | ICD-10-CM | POA: Insufficient documentation

## 2016-02-07 NOTE — Assessment & Plan Note (Signed)
Patient with recent diagnosis of diabetes based on his most recent A1c. Foot exam performed today. We will refer him to podiatry given callus formation and his onychomycosis. We will check microalbumin today. He has seen the ophthalmologist recently for cataract removal and states he did not have any diabetic eye issues. We will increase his metformin 1000 mg in the morning and 500 mg at night with a goal of 2000 mg total daily in the future.

## 2016-02-07 NOTE — Progress Notes (Signed)
Patient ID: Michael Montgomery, male   DOB: 1961-07-25, 55 y.o.   MRN: [id number]  Michael Rumps, MD Phone: [phone]  Michael Montgomery is a 55 y.o. male who presents today for follow-up  DIABETES Disease Monitoring: Blood Sugar ranges-not checking, only on metformin Polyuria/phagia/dipsia- no      Optho- saw on Tuesday Medications: Compliance- taking metformin 500 mg BID Hypoglycemic symptoms- no  HYPERTENSION Disease Monitoring Home BP Monitoring 140's/80's Chest pain- no    Dyspnea- no Medications Compliance-  Taking amlodipine 5 mg daily.  Edema- no  Erectile dysfunction: Patient notes he is unable to attain an erection. He does not wake up at night with an erection. This is been going on several years. He is unable to ejaculate. He is tried multiple methods of achieving an erection though has been able to obtain one. He has no cardiac issues. He denies chest pain with physical activity. No shortness of breath with physical activity either. He reports his testicles of not changed in size recently.  PMH: Smoker   ROS see history of present illness  Objective  Physical Exam Filed Vitals:   02/04/16 0926  BP: 136/82  Pulse: 99  Temp: 98.6 F (37 C)    BP Readings from Last 3 Encounters:  02/04/16 136/82  02/01/16 136/83  01/28/16 146/84   Wt Readings from Last 3 Encounters:  02/04/16 168 lb 6.4 oz (76.386 kg)  02/01/16 166 lb (75.297 kg)  01/28/16 168 lb (76.204 kg)    Physical Exam  Constitutional: He is well-developed, well-nourished, and in no distress.  HENT:  Head: Normocephalic and atraumatic.  Right Ear: External ear normal.  Left Ear: External ear normal.  Cardiovascular: Normal rate, regular rhythm and normal heart sounds.  Exam reveals no gallop and no friction rub.   No murmur heard. Pulmonary/Chest: Effort normal and breath sounds normal. No respiratory distress. He has no wheezes. He has no rales.  Genitourinary:  Penis normal, scrotum  normal, bilateral testicles atrophic, nontender, no inguinal hernias  Musculoskeletal: He exhibits no edema.  Neurological: He is alert.  Skin: He is not diaphoretic.  Bilateral feet with significant callus formation under the ball of his foot, thickened nails throughout both of his feet consistent with onychomycosis, sensation to light touch intact bilaterally extremities, monofilament testing intact in bilateral lower extremities     Assessment/Plan: Please see individual problem list.  Essential hypertension Pressure improved today from previously. Goal is less than 130/80 with his diabetes. We will increase his amlodipine to 10 mg daily today. We will check urine microalbumin and if elevated would add on an ace next. He will continue to monitor at home.  Diabetes [facility]) Patient with recent diagnosis of diabetes based on his most recent A1c. Foot exam performed today. We will refer him to podiatry given callus formation and his onychomycosis. We will check microalbumin today. He has seen the ophthalmologist recently for cataract removal and states he did not have any diabetic eye issues. We will increase his metformin 1000 mg in the morning and 500 mg at night with a goal of 2000 mg total daily in the future.  Erectile dysfunction Suspect this is likely related to his untreated/unknown chronic medical issues. Given small testicles this could be related to low testosterone. Thus we will check a testosterone level. Could consider referral to endocrinology given atrophic testicles. He has no cardiac contraindication that is known at this time to Viagra. He does not take any nitrates. We will trial Viagra.  I advised that if he takes this and needs to go to the emergency room or call EMS for any reason he needs to let them know so they do not treat him with nitrates. Advised that if he gets lightheaded with taking this medication he should let us know.    Orders Placed This Encounter  Procedures    . Testosterone  . Urine Microalbumin w/creat. ratio  . Ambulatory referral to Podiatry    Referral Priority:  Routine    Referral Type:  Consultation    Referral Reason:  Specialty Services Required    Requested Specialty:  Podiatry    Number of Visits Requested:  1  . Ambulatory referral to Gastroenterology    Referral Priority:  Routine    Referral Type:  Consultation    Referral Reason:  Specialty Services Required    Number of Visits Requested:  1  Referred to GI for colonoscopy.   Meds ordered this encounter  Medications  . amLODipine (NORVASC) 10 MG tablet    Sig: Take 1 tablet (10 mg total) by mouth daily.    Dispense:  90 tablet    Refill:  1  . metFORMIN (GLUCOPHAGE) 500 MG tablet    Sig: Please take 1000 mg (2 tablets) in the morning and 500 mg (1 tablet) at night by mouth    Dispense:  180 tablet    Refill:  3  . sildenafil (VIAGRA) 50 MG tablet    Sig: Take 1 tablet (50 mg total) by mouth daily as needed for erectile dysfunction.    Dispense:  2 tablet    Refill:  0    Michael Rumps, MD [place]

## 2016-02-07 NOTE — Assessment & Plan Note (Addendum)
Suspect this is likely related to his untreated/unknown chronic medical issues. Given small testicles this could be related to low testosterone. Thus we will check a testosterone level. Could consider referral to endocrinology given atrophic testicles. He has no cardiac contraindication that is known at this time to Viagra. He does not take any nitrates. We will trial Viagra. I advised that if he takes this and needs to go to the emergency room or call EMS for any reason he needs to let them know so they do not treat him with nitrates. Advised that if he gets lightheaded with taking this medication he should let us know.

## 2016-02-07 NOTE — Assessment & Plan Note (Signed)
Pressure improved today from previously. Goal is less than 130/80 with his diabetes. We will increase his amlodipine to 10 mg daily today. We will check urine microalbumin and if elevated would add on an ace next. He will continue to monitor at home.

## 2016-02-08 ENCOUNTER — Encounter: Payer: Self-pay | Admitting: Gastroenterology

## 2016-02-11 ENCOUNTER — Other Ambulatory Visit: Payer: Self-pay | Admitting: Family Medicine

## 2016-02-11 DIAGNOSIS — I10 Essential (primary) hypertension: Secondary | ICD-10-CM

## 2016-02-11 MED ORDER — LISINOPRIL 5 MG PO TABS: 2.5000 mg | ORAL_TABLET | Freq: Every day | ORAL | Status: DC

## 2016-02-18 ENCOUNTER — Other Ambulatory Visit (INDEPENDENT_AMBULATORY_CARE_PROVIDER_SITE_OTHER): Payer: BLUE CROSS/BLUE SHIELD

## 2016-02-18 ENCOUNTER — Other Ambulatory Visit: Payer: BLUE CROSS/BLUE SHIELD

## 2016-02-18 ENCOUNTER — Ambulatory Visit: Payer: Self-pay | Admitting: Family Medicine

## 2016-02-18 DIAGNOSIS — I10 Essential (primary) hypertension: Secondary | ICD-10-CM

## 2016-02-19 ENCOUNTER — Encounter: Payer: Self-pay | Admitting: Family Medicine

## 2016-02-19 LAB — BASIC METABOLIC PANEL
BUN: 10 mg/dL (ref 6–23)
CO2: 31 mEq/L (ref 19–32)
Calcium: 9.9 mg/dL (ref 8.4–10.5)
Chloride: 99 mEq/L (ref 96–112)
Creatinine, Ser: 0.62 mg/dL (ref 0.40–1.50)
GFR: 174.51 mL/min (ref >60.00)
Glucose, Bld: 111 mg/dL — ABNORMAL HIGH (ref 70–99)
Potassium: 3.9 mEq/L (ref 3.5–5.1)
Sodium: 137 mEq/L (ref 135–145)

## 2016-02-25 ENCOUNTER — Ambulatory Visit: Payer: BLUE CROSS/BLUE SHIELD | Admitting: Family Medicine

## 2016-02-29 ENCOUNTER — Ambulatory Visit: Payer: BLUE CROSS/BLUE SHIELD | Admitting: Family Medicine

## 2016-03-01 ENCOUNTER — Encounter: Payer: Self-pay | Admitting: Family Medicine

## 2016-03-01 ENCOUNTER — Encounter: Payer: Self-pay | Admitting: *Deleted

## 2016-03-01 ENCOUNTER — Ambulatory Visit (INDEPENDENT_AMBULATORY_CARE_PROVIDER_SITE_OTHER): Payer: BLUE CROSS/BLUE SHIELD | Admitting: Family Medicine

## 2016-03-01 ENCOUNTER — Ambulatory Visit: Payer: BLUE CROSS/BLUE SHIELD | Admitting: Family Medicine

## 2016-03-01 VITALS — BP 122/76 | HR 99 | Temp 98.3°F | Ht 70.0 in | Wt 168.4 lb

## 2016-03-01 DIAGNOSIS — K0510 Chronic gingivitis, plaque induced: Secondary | ICD-10-CM

## 2016-03-01 DIAGNOSIS — N529 Male erectile dysfunction, unspecified: Secondary | ICD-10-CM | POA: Diagnosis not present

## 2016-03-01 NOTE — Assessment & Plan Note (Signed)
Suspect patient's bleeding from gums is related to gingivitis and poor dentition. Discussed continuing Listerine and [employer]. Discussed flossing. Patient will follow up with his dentist as scheduled. Given return precautions.

## 2016-03-01 NOTE — Progress Notes (Signed)
Pre visit review using our clinic review tool, if applicable. No additional management support is needed unless otherwise documented below in the visit note. 

## 2016-03-01 NOTE — Assessment & Plan Note (Signed)
Refer to urology.  ?

## 2016-03-01 NOTE — Patient Instructions (Signed)
Nice to see you. Please keep your appointment with the dentist. We'll refer to urology as well.

## 2016-03-01 NOTE — Progress Notes (Signed)
Patient ID: QUASEAN PINHO, male   DOB: 1960-11-09, 55 y.o.   MRN: [id number]  [name], MD Phone: [phone]  Michael Montgomery is a 55 y.o. male who presents today for same-day visit.  Patient notes bleeding from his gums when brushing his teeth only in the morning. No other bleeding from his mouth. No other bleeding from his body. Notes a history of gingivitis and poor dentition. Does not floss very frequently. Does use Listerine [employer]. Nose bleeding stops quickly. No fatigue, chest pain, shortness of breath, or palpitations. He is going to see a dentist later this month. No fevers. No drainage from his gums.  Erectile dysfunction: Patient notes this has not responded to Viagra. He does have risk factors for vascular causes of erectile dysfunction. He notes good sensation in his penis and testicles. Would like a referral to urology.   ROS see history of present illness  Objective  Physical Exam Filed Vitals:   03/01/16 1556  BP: 122/76  Pulse: 99  Temp: 98.3 F (36.8 C)    BP Readings from Last 3 Encounters:  03/01/16 122/76  02/04/16 136/82  02/01/16 136/83   Wt Readings from Last 3 Encounters:  03/01/16 168 lb 6.4 oz (76.386 kg)  03/01/16 170 lb (77.111 kg)  02/04/16 168 lb 6.4 oz (76.386 kg)    Physical Exam  Constitutional: He is well-developed, well-nourished, and in no distress.  HENT:  Head: Normocephalic and atraumatic.  Poor dentition with multiple broken and cracked teeth, no gingival erythema or drainage, no tenderness of his teeth, no jaw tenderness  Eyes: Conjunctivae are normal. Pupils are equal, round, and reactive to light.  Cardiovascular: Normal rate, regular rhythm and normal heart sounds.   Pulmonary/Chest: Effort normal and breath sounds normal.  Neurological: He is alert.  Skin: Skin is warm and dry. He is not diaphoretic.     Assessment/Plan: Please see individual problem list.  Erectile dysfunction Refer to  urology.  Gingivitis Suspect patient's bleeding from gums is related to gingivitis and poor dentition. Discussed continuing Listerine and [employer]. Discussed flossing. Patient will follow up with his dentist as scheduled. Given return precautions.    Orders Placed This Encounter  Procedures  . Ambulatory referral to Urology    Referral Priority:  Routine    Referral Type:  Consultation    Referral Reason:  Specialty Services Required    Requested Specialty:  Urology    Number of Visits Requested:  Tamms, MD [place]

## 2016-03-02 ENCOUNTER — Ambulatory Visit: Payer: Self-pay | Admitting: Podiatry

## 2016-03-04 NOTE — Discharge Instructions (Signed)

## 2016-03-07 ENCOUNTER — Encounter
Admission: RE | Disposition: A | Discharge status: Home / Self Care | Payer: Self-pay | Source: Ambulatory Visit | Attending: Ophthalmology

## 2016-03-07 ENCOUNTER — Ambulatory Visit: Payer: BLUE CROSS/BLUE SHIELD | Admitting: Anesthesiology

## 2016-03-07 ENCOUNTER — Ambulatory Visit
Admission: RE | Admit: 2016-03-07 | Discharge: 2016-03-07 | Disposition: A | Discharge status: Home / Self Care | Payer: BLUE CROSS/BLUE SHIELD | Source: Ambulatory Visit | Attending: Ophthalmology | Admitting: Ophthalmology

## 2016-03-07 DIAGNOSIS — E119 Type 2 diabetes mellitus without complications: Secondary | ICD-10-CM | POA: Diagnosis not present

## 2016-03-07 DIAGNOSIS — E7800 Pure hypercholesterolemia, unspecified: Secondary | ICD-10-CM | POA: Diagnosis not present

## 2016-03-07 DIAGNOSIS — I10 Essential (primary) hypertension: Secondary | ICD-10-CM | POA: Insufficient documentation

## 2016-03-07 DIAGNOSIS — H25042 Posterior subcapsular polar age-related cataract, left eye: Secondary | ICD-10-CM | POA: Diagnosis not present

## 2016-03-07 DIAGNOSIS — Z9841 Cataract extraction status, right eye: Secondary | ICD-10-CM | POA: Insufficient documentation

## 2016-03-07 DIAGNOSIS — Z8711 Personal history of peptic ulcer disease: Secondary | ICD-10-CM | POA: Diagnosis not present

## 2016-03-07 DIAGNOSIS — H268 Other specified cataract: Secondary | ICD-10-CM | POA: Diagnosis present

## 2016-03-07 HISTORY — PX: CATARACT EXTRACTION W/PHACO: SHX586

## 2016-03-07 LAB — GLUCOSE, CAPILLARY
Glucose-Capillary: 123 mg/dL — ABNORMAL HIGH (ref 65–99)
Glucose-Capillary: 116 mg/dL — ABNORMAL HIGH (ref 65–99)

## 2016-03-07 SURGERY — PHACOEMULSIFICATION, CATARACT, WITH IOL INSERTION
Anesthesia: Monitor Anesthesia Care | Laterality: Left | Wound class: Clean

## 2016-03-07 MED ORDER — TRYPAN BLUE 0.06 % OP SOLN
OPHTHALMIC | Status: DC | PRN
  Administered 2016-03-07: 0.5 mL via INTRAOCULAR

## 2016-03-07 MED ORDER — LIDOCAINE HCL (PF) 4 % IJ SOLN
INTRAOCULAR | Status: DC | PRN
  Administered 2016-03-07: 1 mL via OPHTHALMIC

## 2016-03-07 MED ORDER — BRIMONIDINE TARTRATE 0.2 % OP SOLN
OPHTHALMIC | Status: DC | PRN
  Administered 2016-03-07: 1 [drp] via OPHTHALMIC

## 2016-03-07 MED ORDER — TIMOLOL MALEATE 0.5 % OP SOLN
OPHTHALMIC | Status: DC | PRN
  Administered 2016-03-07: 1 [drp] via OPHTHALMIC

## 2016-03-07 MED ORDER — ARMC OPHTHALMIC DILATING GEL
1.0000 "application " | OPHTHALMIC | Status: DC | PRN
  Administered 2016-03-07 (×2): 1 via OPHTHALMIC

## 2016-03-07 MED ORDER — LACTATED RINGERS IV SOLN: INTRAVENOUS | Status: DC

## 2016-03-07 MED ORDER — BSS IO SOLN
INTRAOCULAR | Status: DC | PRN
  Administered 2016-03-07: 85 mL via OPHTHALMIC

## 2016-03-07 MED ORDER — CEFUROXIME OPHTHALMIC INJECTION 1 MG/0.1 ML
INJECTION | OPHTHALMIC | Status: DC | PRN
  Administered 2016-03-07: 0.1 mL via OPHTHALMIC

## 2016-03-07 MED ORDER — FENTANYL CITRATE (PF) 100 MCG/2ML IJ SOLN
INTRAMUSCULAR | Status: DC | PRN
  Administered 2016-03-07: 100 ug via INTRAVENOUS

## 2016-03-07 MED ORDER — TETRACAINE HCL 0.5 % OP SOLN
1.0000 [drp] | OPHTHALMIC | Status: DC | PRN
Start: 2016-03-07 — End: 2016-03-07
  Administered 2016-03-07: 1 [drp] via OPHTHALMIC

## 2016-03-07 MED ORDER — NA HYALUR & NA CHOND-NA HYALUR 0.4-0.35 ML IO KIT
PACK | INTRAOCULAR | Status: DC | PRN
  Administered 2016-03-07: 1 mL via INTRAOCULAR

## 2016-03-07 MED ORDER — MIDAZOLAM HCL 2 MG/2ML IJ SOLN
INTRAMUSCULAR | Status: DC | PRN
  Administered 2016-03-07: 2 mg via INTRAVENOUS

## 2016-03-07 MED ORDER — POVIDONE-IODINE 5 % OP SOLN
1.0000 "application " | OPHTHALMIC | Status: DC | PRN
  Administered 2016-03-07: 1 via OPHTHALMIC

## 2016-03-07 SURGICAL SUPPLY — 28 items

## 2016-03-07 NOTE — Anesthesia Postprocedure Evaluation (Signed)
Anesthesia Post Note  Patient: Michael Montgomery  Procedure(s) Performed: Procedure(s) (LRB): CATARACT EXTRACTION PHACO AND INTRAOCULAR LENS PLACEMENT (IOC) (Left)  Patient location during evaluation: PACU Anesthesia Type: MAC Level of consciousness: awake and alert Pain management: pain level controlled Vital Signs Assessment: post-procedure vital signs reviewed and stable Respiratory status: spontaneous breathing, nonlabored ventilation, respiratory function stable and patient connected to nasal cannula oxygen Cardiovascular status: stable and blood pressure returned to baseline Anesthetic complications: no    Shannon Balthazar C

## 2016-03-07 NOTE — Op Note (Signed)
Date of Surgery: 03/07/2016  PREOPERATIVE DIAGNOSES: Visually significant posterior subcapsular cataract, left eye.  POSTOPERATIVE DIAGNOSES: Same  PROCEDURES PERFORMED: Cataract extraction with intraocular lens implant, left eye.  SURGEON: Almon Hercules, M.D.  ANESTHESIA: MAC and topical  IMPLANTS: PCB00 +23.0 D   Implant Name Type Inv. Item Serial No. Manufacturer Lot No. LRB No. Used  23.0D lens PCBOO     [id number] [facility]   Left 1    COMPLICATIONS: None.  DESCRIPTION OF PROCEDURE: Therapeutic options were discussed with the patient preoperatively, including a discussion of risks and benefits of surgery. Informed consent was obtained. An IOL-Master and immersion biometry were used to take the lens measurements, and a dilated fundus exam was performed within 6 months of the surgical date.  The patient was premedicated and brought to the operating room and placed on the operating table in the supine position. After adequate anesthesia, the patient was prepped and draped in the usual sterile ophthalmic fashion. A wire lid speculum was inserted and the microscope was positioned. A Superblade was used to create a paracentesis site at the limbus and a small amount of dilute preservative free lidocaine was instilled into the anterior chamber, followed by dispersive viscoelastic. A clear corneal incision was created temporally using a 2.4 mm keratome blade. Capsulorrhexis was then performed. In situ phacoemulsification was performed.  Cortical material was removed with the irrigation-aspiration unit. Dispersive viscoelastic was instilled to open the capsular bag. A posterior chamber intraocular lens with the specifications above was inserted and positioned. Irrigation-aspiration was used to remove all viscoelastic. Cefuroxime 1cc was instilled into the anterior chamber, and the corneal incision was checked and found to be water tight. The eyelid speculum was removed.  The operative eye was  covered with protective goggles after instilling 1 drop of timolol and brimonidine. The patient tolerated the procedure well. There were no complications.

## 2016-03-07 NOTE — H&P (Signed)
H+P reviewed and is up to date, please see paper chart.  

## 2016-03-07 NOTE — Anesthesia Procedure Notes (Signed)
Procedure Name: MAC Performed by: Bannon Giammarco Pre-anesthesia Checklist: Patient identified, Emergency Drugs available, Suction available, Timeout performed and Patient being monitored Patient Re-evaluated:Patient Re-evaluated prior to inductionOxygen Delivery Method: Nasal cannula Placement Confirmation: positive ETCO2       

## 2016-03-07 NOTE — Anesthesia Preprocedure Evaluation (Signed)
Anesthesia Evaluation  Patient identified by MRN, date of birth, ID band Patient awake    Reviewed: Allergy & Precautions, NPO status , Patient's Chart, lab work & pertinent test results  Airway Mallampati: II  TM Distance: >3 FB Neck ROM: Full    Dental no notable dental hx. (+)    Pulmonary Current Smoker,    Pulmonary exam normal breath sounds clear to auscultation       Cardiovascular hypertension, Normal cardiovascular exam Rhythm:Regular Rate:Normal  hypercholesterolemia   Neuro/Psych negative neurological ROS  negative psych ROS   GI/Hepatic negative GI ROS, Neg liver ROS, PUD,   Endo/Other  diabetes  Renal/GU negative Renal ROS  negative genitourinary   Musculoskeletal negative musculoskeletal ROS (+)   Abdominal   Peds negative pediatric ROS (+)  Hematology negative hematology ROS (+)   Anesthesia Other Findings Poor dentition.  Nothing loose per patient  Reproductive/Obstetrics negative OB ROS                             Anesthesia Physical Anesthesia Plan  ASA: II  Anesthesia Plan: MAC   Post-op Pain Management:    Induction: Intravenous  Airway Management Planned:   Additional Equipment:   Intra-op Plan:   Post-operative Plan: Extubation in OR  Informed Consent: I have reviewed the patients History and Physical, chart, labs and discussed the procedure including the risks, benefits and alternatives for the proposed anesthesia with the patient or authorized representative who has indicated his/her understanding and acceptance.   Dental advisory given  Plan Discussed with: CRNA  Anesthesia Plan Comments:         Anesthesia Quick Evaluation

## 2016-03-07 NOTE — Transfer of Care (Signed)
Immediate Anesthesia Transfer of Care Note  Patient: Michael Montgomery  Procedure(s) Performed: Procedure(s) with comments: CATARACT EXTRACTION PHACO AND INTRAOCULAR LENS PLACEMENT (IOC) (Left) - DIABETIC - oral meds VISION BLUE  Patient Location: PACU  Anesthesia Type: MAC  Level of Consciousness: awake, alert  and patient cooperative  Airway and Oxygen Therapy: Patient Spontanous Breathing and Patient connected to supplemental oxygen  Post-op Assessment: Post-op Vital signs reviewed, Patient's Cardiovascular Status Stable, Respiratory Function Stable, Patent Airway and No signs of Nausea or vomiting  Post-op Vital Signs: Reviewed and stable  Complications: No apparent anesthesia complications

## 2016-03-08 ENCOUNTER — Encounter: Payer: Self-pay | Admitting: Ophthalmology

## 2016-03-08 ENCOUNTER — Ambulatory Visit: Payer: BLUE CROSS/BLUE SHIELD | Admitting: Family Medicine

## 2016-03-14 ENCOUNTER — Encounter: Payer: Self-pay | Admitting: Sports Medicine

## 2016-03-14 ENCOUNTER — Ambulatory Visit (INDEPENDENT_AMBULATORY_CARE_PROVIDER_SITE_OTHER): Payer: BLUE CROSS/BLUE SHIELD | Admitting: Sports Medicine

## 2016-03-14 VITALS — BP 124/84 | HR 69 | Resp 12

## 2016-03-14 DIAGNOSIS — E119 Type 2 diabetes mellitus without complications: Secondary | ICD-10-CM

## 2016-03-14 DIAGNOSIS — Q828 Other specified congenital malformations of skin: Secondary | ICD-10-CM

## 2016-03-14 DIAGNOSIS — M79672 Pain in left foot: Secondary | ICD-10-CM

## 2016-03-14 DIAGNOSIS — B351 Tinea unguium: Secondary | ICD-10-CM | POA: Diagnosis not present

## 2016-03-14 DIAGNOSIS — M79671 Pain in right foot: Secondary | ICD-10-CM

## 2016-03-14 NOTE — Progress Notes (Signed)
Patient ID: Michael Montgomery, male   DOB: 03-29-1961, 55 y.o.   MRN: [id number] Subjective: Michael Montgomery is a 55 y.o. male patient with history of diabetes who presents to office today complaining of long, painful nails  while ambulating in shoes; unable to trim. Patient is also concerned with Left big toenail changes and desires treatment for this too. Patient states that the glucose reading this morning was 100 mg/dl. Patient denies any new changes in medication or new problems. Patient denies any new cramping, numbness, burning or tingling in the legs.  Patient Active Problem List   Diagnosis Date Noted  . Gingivitis 03/01/2016  . Diabetes ([place]) 02/07/2016  . Erectile dysfunction 02/07/2016  . Muscle wasting and atrophy, not elsewhere classified, unspecified lower leg 01/21/2016  . Left leg weakness 01/21/2016  . Essential hypertension 01/19/2016  . Cataracts, bilateral 01/19/2016  . Left knee pain 01/19/2016   Current Outpatient Prescriptions on File Prior to Visit  Medication Sig Dispense Refill  . amLODipine (NORVASC) 10 MG tablet Take 1 tablet (10 mg total) by mouth daily. 90 tablet 1  . atorvastatin (LIPITOR) 40 MG tablet Take 1 tablet (40 mg total) by mouth daily. 90 tablet 3  . lisinopril (PRINIVIL,ZESTRIL) 5 MG tablet Take 0.5 tablets (2.5 mg total) by mouth daily. 45 tablet 3  . metFORMIN (GLUCOPHAGE) 500 MG tablet Please take 1000 mg (2 tablets) in the morning and 500 mg (1 tablet) at night by mouth 180 tablet 3  . Multiple Vitamin (MULTIVITAMIN) capsule Take 1 capsule by mouth daily.    . sildenafil (VIAGRA) 50 MG tablet Take 1 tablet (50 mg total) by mouth daily as needed for erectile dysfunction. (Patient not taking: Reported on 03/01/2016) 2 tablet 0   No current facility-administered medications on file prior to visit.   No Known Allergies  Recent Results (from the past 2160 hour(s))  Comp Met (CMET)     Status: Abnormal   Collection Time: 01/19/16 11:31 AM  Result  Value Ref Range   Sodium 137 135 - 145 mEq/L   Potassium 4.0 3.5 - 5.1 mEq/L   Chloride 99 96 - 112 mEq/L   CO2 29 19 - 32 mEq/L   Glucose, Bld 195 (H) 70 - 99 mg/dL   BUN 13 6 - 23 mg/dL   Creatinine, Ser 0.61 0.40 - 1.50 mg/dL   Total Bilirubin 0.7 0.2 - 1.2 mg/dL   Alkaline Phosphatase 114 39 - 117 U/L   AST 12 0 - 37 U/L   ALT 13 0 - 53 U/L   Total Protein 7.3 6.0 - 8.3 g/dL   Albumin 4.3 3.5 - 5.2 g/dL   Calcium 9.5 8.4 - 10.5 mg/dL   GFR 177.87 >60.00 mL/min  Lipid Profile     Status: Abnormal   Collection Time: 01/19/16 11:31 AM  Result Value Ref Range   Cholesterol 197 0 - 200 mg/dL    Comment: ATP III Classification       Desirable:  < 200 mg/dL               Borderline High:  200 - 239 mg/dL          High:  > = 240 mg/dL   Triglycerides 110.0 0.0 - 149.0 mg/dL    Comment: Normal:  <150 mg/dLBorderline High:  150 - 199 mg/dL   HDL 45.00 >39.00 mg/dL   VLDL 22.0 0.0 - 40.0 mg/dL   LDL Cholesterol 130 (H) 0 - 99 mg/dL  Total CHOL/HDL Ratio 4     Comment:                Men          Women1/2 Average Risk     3.4          3.3Average Risk          5.0          4.42X Average Risk          9.6          7.13X Average Risk          15.0          11.0                       NonHDL 151.63     Comment: NOTE:  Non-HDL goal should be 30 mg/dL higher than patient's LDL goal (i.e. LDL goal of < 70 mg/dL, would have non-HDL goal of < 100 mg/dL)  HgB A1c     Status: Abnormal   Collection Time: 01/19/16 11:31 AM  Result Value Ref Range   Hgb A1c MFr Bld 8.8 (H) 4.6 - 6.5 %    Comment: Glycemic Control Guidelines for People with Diabetes:Non Diabetic:  <6%Goal of Therapy: <7%Additional Action Suggested:  >8%   TSH     Status: None   Collection Time: 01/19/16 11:31 AM  Result Value Ref Range   TSH 1.68 0.35 - 4.50 uIU/mL  CBC     Status: None   Collection Time: 01/19/16 11:31 AM  Result Value Ref Range   WBC 6.0 4.0 - 10.5 K/uL   RBC 5.57 4.22 - 5.81 Mil/uL   Platelets 209.0 150.0 -  400.0 K/uL   Hemoglobin 16.3 13.0 - 17.0 g/dL   HCT 47.9 39.0 - 52.0 %   MCV 86.1 78.0 - 100.0 fl   MCHC 33.9 30.0 - 36.0 g/dL   RDW 12.7 11.5 - 15.5 %  Glucose, capillary     Status: Abnormal   Collection Time: 02/01/16  6:39 AM  Result Value Ref Range   Glucose-Capillary 189 (H) 65 - 99 mg/dL  Glucose, capillary     Status: Abnormal   Collection Time: 02/01/16  8:19 AM  Result Value Ref Range   Glucose-Capillary 169 (H) 65 - 99 mg/dL  Testosterone     Status: Abnormal   Collection Time: 02/04/16 10:18 AM  Result Value Ref Range   Testosterone 298.54 (L) 300.00 - 890.00 ng/dL  Urine Microalbumin w/creat. ratio     Status: Abnormal   Collection Time: 02/04/16  4:30 PM  Result Value Ref Range   Creatinine, Urine 127 20 - 370 mg/dL   Microalb, Ur 3.8 Not estab mg/dL   Microalb Creat Ratio 30 (H) <30 mcg/mg creat    Comment: The ADA has defined abnormalities in albumin excretion as follows:           Category           Result                            (mcg/mg creatinine)                 Normal:    <30       Microalbuminuria:    30 - 299   Clinical albuminuria:    > or = 300   The ADA recommends that  at least two of three specimens collected within a 3 - 6 month period be abnormal before considering a patient to be within a diagnostic category.     Basic Metabolic Panel (BMET)     Status: Abnormal   Collection Time: 02/18/16  3:19 PM  Result Value Ref Range   Sodium 137 135 - 145 mEq/L   Potassium 3.9 3.5 - 5.1 mEq/L   Chloride 99 96 - 112 mEq/L   CO2 31 19 - 32 mEq/L   Glucose, Bld 111 (H) 70 - 99 mg/dL   BUN 10 6 - 23 mg/dL   Creatinine, Ser 0.62 0.40 - 1.50 mg/dL   Calcium 9.9 8.4 - 10.5 mg/dL   GFR 174.51 >60.00 mL/min  Glucose, capillary     Status: Abnormal   Collection Time: 03/07/16  7:07 AM  Result Value Ref Range   Glucose-Capillary 123 (H) 65 - 99 mg/dL  Glucose, capillary     Status: Abnormal   Collection Time: 03/07/16  8:58 AM  Result Value Ref Range    Glucose-Capillary 116 (H) 65 - 99 mg/dL    Objective: General: Patient is awake, alert, and oriented x 3 and in no acute distress.  Integument: Skin is warm, dry and supple bilateral. Nails are tender, long, thickened and  dystrophic with subungual debris, consistent with onychomycosis, 1-5 bilateral with the left great toenail being most involved. No signs of infection. Keratotic lesion plantar right foot. No open lesions or preulcerative lesions present bilateral. Remaining integument unremarkable.  Vasculature:  Dorsalis Pedis pulse 2/4 bilateral. Posterior Tibial pulse  1/4 bilateral.  Capillary fill time <3 sec 1-5 bilateral. Positive hair growth to the level of the digits. Temperature gradient within normal limits. No varicosities present bilateral. No edema present bilateral.   Neurology: The patient has intact sensation measured with a 5.07/10g Semmes Weinstein Monofilament at all pedal sites bilateral . Vibratory sensation intact bilateral with tuning fork. No Babinski sign present bilateral.   Musculoskeletal: No symptomatic pedal deformities noted bilateral. Muscular strength 5/5 in all lower extremity muscular groups bilateral without pain on range of motion . No tenderness with calf compression bilateral.  Assessment and Plan: Problem List Items Addressed This Visit    None    Visit Diagnoses    Dermatophytosis, nail    -  Primary    Relevant Orders    Fungus Culture with Smear    Porokeratosis        Foot pain, bilateral        Diabetes mellitus without complication ([place])           -Examined patient. -Discussed and educated patient on diabetic foot care, especially with  regards to the vascular, neurological and musculoskeletal systems.  -Stressed the importance of good glycemic control and the detriment of not  controlling glucose levels in relation to the foot. -Mechanically debrided keratosis on right foot using sterile chisel blade and all nails 1-5 bilateral  using sterile nail nipper and filed with dremel without incident  -Fungal culture was obtained and sent of Left hallux nail to [facility]; to call patient with results  -Answered all patient questions -Patient to return  in 3 months for at risk foot care -Patient advised to call the office if any problems or questions arise in the meantime.  Landis Martins, DPM

## 2016-03-14 NOTE — Patient Instructions (Addendum)
Diabetes and Foot Care Diabetes may cause you to have problems because of poor blood supply (circulation) to your feet and legs. This may cause the skin on your feet to become thinner, break easier, and heal more slowly. Your skin may become dry, and the skin may peel and crack. You may also have nerve damage in your legs and feet causing decreased feeling in them. You may not notice minor injuries to your feet that could lead to infections or more serious problems. Taking care of your feet is one of the most important things you can do for yourself.  HOME CARE INSTRUCTIONS  Wear shoes at all times, even in the house. Do not go barefoot. Bare feet are easily injured.  Check your feet daily for blisters, cuts, and redness. If you cannot see the bottom of your feet, use a mirror or ask someone for help.  Wash your feet with warm water (do not use hot water) and mild soap. Then pat your feet and the areas between your toes until they are completely dry. Do not soak your feet as this can dry your skin.  Apply a moisturizing lotion or petroleum jelly (that does not contain alcohol and is unscented) to the skin on your feet and to dry, brittle toenails. Do not apply lotion between your toes.  Trim your toenails straight across. Do not dig under them or around the cuticle. File the edges of your nails with an emery board or nail file.  Do not cut corns or calluses or try to remove them with medicine.  Wear clean socks or stockings every day. Make sure they are not too tight. Do not wear knee-high stockings since they may decrease blood flow to your legs.  Wear shoes that fit properly and have enough cushioning. To break in new shoes, wear them for just a few hours a day. This prevents you from injuring your feet. Always look in your shoes before you put them on to be sure there are no objects inside.  Do not cross your legs. This may decrease the blood flow to your feet.  If you find a minor scrape,  cut, or break in the skin on your feet, keep it and the skin around it clean and dry. These areas may be cleansed with mild soap and water. Do not cleanse the area with peroxide, alcohol, or iodine.  When you remove an adhesive bandage, be sure not to damage the skin around it.  If you have a wound, look at it several times a day to make sure it is healing.  Do not use heating pads or hot water bottles. They may burn your skin. If you have lost feeling in your feet or legs, you may not know it is happening until it is too late.  Make sure your health care provider performs a complete foot exam at least annually or more often if you have foot problems. Report any cuts, sores, or bruises to your health care provider immediately. SEEK MEDICAL CARE IF:   You have an injury that is not healing.  You have cuts or breaks in the skin.  You have an ingrown nail.  You notice redness on your legs or feet.  You feel burning or tingling in your legs or feet.  You have pain or cramps in your legs and feet.  Your legs or feet are numb.  Your feet always feel cold. SEEK IMMEDIATE MEDICAL CARE IF:   There is increasing redness,   swelling, or pain in or around a wound.  There is a red line that goes up your leg.  Pus is coming from a wound.  You develop a fever or as directed by your health care provider.  You notice a bad smell coming from an ulcer or wound.   This information is not intended to replace advice given to you by your health care provider. Make sure you discuss any questions you have with your health care provider.   Document Released: 10/07/2000 Document Revised: 06/12/2013 Document Reviewed: 03/19/2013 Elsevier Interactive Patient Education 2016 Elsevier Inc[employer]   [employer] Healthy Feet cream daily for dry skin after shower

## 2016-03-15 ENCOUNTER — Encounter: Payer: Self-pay | Admitting: Urology

## 2016-03-15 ENCOUNTER — Ambulatory Visit (INDEPENDENT_AMBULATORY_CARE_PROVIDER_SITE_OTHER): Payer: BLUE CROSS/BLUE SHIELD | Admitting: Urology

## 2016-03-15 VITALS — BP 135/82 | HR 105 | Ht 70.0 in | Wt 165.2 lb

## 2016-03-15 DIAGNOSIS — E291 Testicular hypofunction: Secondary | ICD-10-CM | POA: Diagnosis not present

## 2016-03-15 DIAGNOSIS — N401 Enlarged prostate with lower urinary tract symptoms: Secondary | ICD-10-CM | POA: Diagnosis not present

## 2016-03-15 DIAGNOSIS — N529 Male erectile dysfunction, unspecified: Secondary | ICD-10-CM

## 2016-03-15 DIAGNOSIS — N528 Other male erectile dysfunction: Secondary | ICD-10-CM

## 2016-03-15 DIAGNOSIS — N138 Other obstructive and reflux uropathy: Secondary | ICD-10-CM

## 2016-03-15 DIAGNOSIS — N471 Phimosis: Secondary | ICD-10-CM

## 2016-03-15 MED ORDER — NYSTATIN-TRIAMCINOLONE 100000-0.1 UNIT/GM-% EX OINT: 1.0000 "application " | TOPICAL_OINTMENT | Freq: Two times a day (BID) | CUTANEOUS | Status: DC

## 2016-03-15 NOTE — Progress Notes (Signed)
03/15/2016 2:54 PM   Michael Montgomery [birth year] [id number]  Referring provider: Leone Haven, MD [address] [place], [place] [address]  Chief Complaint  Patient presents with  . Erectile Dysfunction    referred by Dr. Caryl Bis    HPI: Patient is a 55 year old diabetic African-American who is referred to [place] by Dr. Caryl Bis for erectile dysfunction.  His SHIM score is 5, which is severe erectile dysfunction.   He has been having difficulty with erections for 1 year.   His major complaint is achieving and maintaining an erection.  His libido is diminished.   His risk factors for ED are age, BPH, smoking, diabetes, HTN and HLD.  He denies any painful erections or curvatures with his erections.   He is no longer having spontaneous erections.   He has tried Viagra in the past without success.        SHIM      03/15/16 1431       SHIM: Over the last 6 months:   How do you rate your confidence that you could get and keep an erection? Very Low     When you had erections with sexual stimulation, how often were your erections hard enough for penetration (entering your partner)? Almost Never or Never     During sexual intercourse, how often were you able to maintain your erection after you had penetrated (entered) your partner? Extremely Difficult     During sexual intercourse, how difficult was it to maintain your erection to completion of intercourse? Extremely Difficult     When you attempted sexual intercourse, how often was it satisfactory for you? Extremely Difficult     SHIM Total Score   SHIM 5        Score: 1-7 Severe ED 8-11 Moderate ED 12-16 Mild-Moderate ED 17-21 Mild ED 22-25 No ED  Patient is experiencing a decrease in libido and erections being less strong.  Patient's testosterone was found to be 298.54 ng/dL on 02/04/2016.  The specimen was collected a little bit after 10 AM.     His IPSS score today is 2, which is mild lower urinary tract  symptomatology. He is pleased with his quality life due to his urinary symptoms. He denies any dysuria, hematuria or suprapubic pain.   He also denies any recent fevers, chills, nausea or vomiting.  He does not have a family history of PCa.      IPSS      03/15/16 1400       International Prostate Symptom Score   How often have you had the sensation of not emptying your bladder? Not at All     How often have you had to urinate less than every two hours? Less than 1 in 5 times     How often have you found you stopped and started again several times when you urinated? Not at All     How often have you found it difficult to postpone urination? Not at All     How often have you had a weak urinary stream? Not at All     How often have you had to strain to start urination? Not at All     How many times did you typically get up at night to urinate? 1 Time     Total IPSS Score 2     Quality of Life due to urinary symptoms   If you were to spend the rest of your life with  your urinary condition just the way it is now how would you feel about that? Pleased        Score:  1-7 Mild 8-19 Moderate 20-35 Severe    PMH: Past Medical History  Diagnosis Date  . Chickenpox   . Gastric ulcer   . High blood pressure   . Diabetes mellitus without complication ([place])   . Heart murmur     When he was younger  . Hypercholesteremia   . Knee injury     left  . Motion sickness     boats    Surgical History: Past Surgical History  Procedure Laterality Date  . Cataract extraction w/phaco Right 02/01/2016    Procedure: CATARACT EXTRACTION PHACO AND INTRAOCULAR LENS PLACEMENT ([place]) right;  Surgeon: Ronnell Freshwater, MD;  Location: [facility];  Service: Ophthalmology;  Laterality: Right;  DIABETIC - oral meds  . Cataract extraction w/phaco Left 03/07/2016    Procedure: CATARACT EXTRACTION PHACO AND INTRAOCULAR LENS PLACEMENT (IOC);  Surgeon: Ronnell Freshwater, MD;  Location:  [facility];  Service: Ophthalmology;  Laterality: Left;  DIABETIC - oral meds     Home Medications:    Medication List       This list is accurate as of: 03/15/16  2:54 PM.  Always use your most recent med list.               amLODipine 10 MG tablet  Commonly known as:  NORVASC  Take 1 tablet (10 mg total) by mouth daily.     atorvastatin 40 MG tablet  Commonly known as:  LIPITOR  Take 1 tablet (40 mg total) by mouth daily.     gabapentin 300 MG capsule  Commonly known as:  NEURONTIN  Take by mouth. Reported on 03/15/2016     lisinopril 5 MG tablet  Commonly known as:  PRINIVIL,ZESTRIL  Take 0.5 tablets (2.5 mg total) by mouth daily.     metFORMIN 500 MG tablet  Commonly known as:  GLUCOPHAGE  Please take 1000 mg (2 tablets) in the morning and 500 mg (1 tablet) at night by mouth     multivitamin capsule  Take 1 capsule by mouth daily.     nystatin-triamcinolone ointment  Commonly known as:  MYCOLOG  Apply 1 application topically 2 (two) times daily.     predniSONE 50 MG tablet  Commonly known as:  DELTASONE  Reported on 03/15/2016     sildenafil 50 MG tablet  Commonly known as:  VIAGRA  Take 1 tablet (50 mg total) by mouth daily as needed for erectile dysfunction.        Allergies: No Known Allergies  Family History: Family History  Problem Relation Age of Onset  . Alcoholism Father   . Hyperlipidemia Father   . Hypertension Father   . Diabetes Mother   . Kidney disease Neg Hx   . Prostate cancer Neg Hx     Social History:  reports that he has been smoking Cigars.  He does not have any smokeless tobacco history on file. He reports that he drinks alcohol. He reports that he does not use illicit drugs.  ROS: UROLOGY Frequent Urination?: No Hard to postpone urination?: Yes Burning/pain with urination?: No Get up at night to urinate?: No Leakage of urine?: Yes Urine stream starts and stops?: No Trouble starting stream?: No Do you have to  strain to urinate?: No Blood in urine?: No Urinary tract infection?: No Sexually transmitted disease?: No Injury to  kidneys or bladder?: No Painful intercourse?: No Weak stream?: No Erection problems?: Yes Penile pain?: No  Gastrointestinal Nausea?: No Vomiting?: No Indigestion/heartburn?: No Diarrhea?: Yes Constipation?: No  Constitutional Fever: No Night sweats?: No Weight loss?: No Fatigue?: No  Skin Skin rash/lesions?: No Itching?: No  Eyes Blurred vision?: No Double vision?: No  Ears/Nose/Throat Sore throat?: No Sinus problems?: No  Hematologic/Lymphatic Swollen glands?: No Easy bruising?: No  Cardiovascular Leg swelling?: No Chest pain?: No  Respiratory Cough?: No Shortness of breath?: No  Endocrine Excessive thirst?: No  Musculoskeletal Back pain?: Yes Joint pain?: No  Neurological Headaches?: No Dizziness?: No  Psychologic Depression?: No Anxiety?: No  Physical Exam: BP 135/82 mmHg  Pulse 105  Ht 5\' 10"  (1.778 m)  Wt 165 lb 3.2 oz (74.934 kg)  BMI 23.70 kg/m2  Constitutional: Well nourished. Alert and oriented, No acute distress. HEENT: [place] AT, moist mucus membranes. Trachea midline, no masses. Cardiovascular: No clubbing, cyanosis, or edema. Respiratory: Normal respiratory effort, no increased work of breathing. GI: Abdomen is soft, non tender, non distended, no abdominal masses. Liver and spleen not palpable.  No hernias appreciated.  Stool sample for occult testing is not indicated.   GU: No CVA tenderness.  No bladder fullness or masses.  Patient with uncircumcised phallus. Foreskin not easily retracted  Foreskin cracked and bleeding.  Urethral meatus is patent.  No penile discharge. No penile lesions or rashes. Scrotum without lesions, cysts, rashes and/or edema.  Testicles are located scrotally bilaterally. They are atrophic.  No masses are appreciated in the testicles. Left and right epididymis are normal. Rectal: Patient with   normal sphincter tone. Anus and perineum without scarring or rashes. No rectal masses are appreciated. Prostate is approximately 50 grams, no nodules are appreciated. Seminal vesicles are normal. Skin: No rashes, bruises or suspicious lesions. Lymph: No cervical or inguinal adenopathy. Neurologic: Grossly intact, no focal deficits, moving all 4 extremities. Psychiatric: Normal mood and affect.  Laboratory Data: Lab Results  Component Value Date   WBC 6.0 01/19/2016   HGB 16.3 01/19/2016   HCT 47.9 01/19/2016   MCV 86.1 01/19/2016   PLT 209.0 01/19/2016    Lab Results  Component Value Date   CREATININE 0.62 02/18/2016     Lab Results  Component Value Date   TESTOSTERONE 298.54* 02/04/2016    Lab Results  Component Value Date   HGBA1C 8.8* 01/19/2016    Lab Results  Component Value Date   TSH 1.68 01/19/2016       Component Value Date/Time   CHOL 197 01/19/2016 1131   HDL 45.00 01/19/2016 1131   CHOLHDL 4 01/19/2016 1131   VLDL 22.0 01/19/2016 1131   LDLCALC 130* 01/19/2016 1131    Lab Results  Component Value Date   AST 12 01/19/2016   Lab Results  Component Value Date   ALT 13 01/19/2016    Assessment & Plan:    1.  Erectile dysfunction:   SHIM score is 5.  I explained to the patient that in order to achieve an erection it takes good functioning of the nervous system (parasympathetic, sympathetic, sensory and motor), good blood flow into the erectile tissue of the penis and a desire to have sex.   I stated that conditions like diabetes, hypertension, coronary artery disease, peripheral vascular disease, smoking, alcohol consumption, age and BPH can diminish the ability to have an erection.   We discussed trying a different PDE5 inhibitor, intra-urethral suppositories, intracavernous vasoactive drug injection therapy, vacuum constriction device  and penile prosthesis implantation.  We discussed trying a different PDE5 inhibitor, intra-urethral suppositories,  intracavernous vasoactive drug injection therapy, vacuum constriction device and penile prosthesis implantation.  I have given him Cialis 20 mg samples, I advised him to take it 2 hours prior to intercourse on an empty stomach.    2. BPH with LUTS:   IPSS score is 2/1.  We will continue to monitor.  PSA is drawn today as patient is interested in pursuing testosterone therapy in the future and testosterone therapy may cause an increase risk in prostate cancer.  3. Hypogonadism:   I explained to patient that the current recommendations from the Endocrine Society reports the diagnosis of hypogonadism requires a serum total testosterone level obtained between 8 and 10 AM at least 2 days apart that is below the laboratory parameters  for normal testosterone.  Our office obtains testosterone levels before 9 AM as we have found some insurance for these requests this time.  At this time, the patient does not meet this requirement.  He will return to the clinic before 9 AM for a serum testosterone drawn.    4. Phimosis:  Patient is prescribed Mycolog-II cream to apply to the head of the penis twice daily.  If this is ineffective, he may need to consider circumcision in the future.    Return for RTC before 9AM for a testosterone draw.  These notes generated with voice recognition software. I apologize for typographical errors.  Zara Council, [facility] [address], [place], [place] [address] [phone]

## 2016-03-15 NOTE — Patient Instructions (Signed)
Tadalafil tablets (Cialis) What is this medicine? TADALAFIL (tah DA la fil) is used to treat erection problems in men. It is also used for enlargement of the prostate gland in men, a condition called benign prostatic hyperplasia or BPH. This medicine improves urine flow and reduces BPH symptoms. This medicine can also treat both erection problems and BPH when they occur together. This medicine may be used for other purposes; ask your health care provider or pharmacist if you have questions. What should I tell my health care provider before I take this medicine? They need to know if you have any of these conditions: -bleeding disorders -eye or vision problems, including a rare inherited eye disease called retinitis pigmentosa -anatomical deformation of the penis, Peyronie's disease, or history of priapism (painful and prolonged erection) -heart disease, angina, a history of heart attack, irregular heart beats, or other heart problems -high or low blood pressure -history of blood diseases, like sickle cell anemia or leukemia -history of stomach bleeding -kidney disease -liver disease -stroke -an unusual or allergic reaction to tadalafil, other medicines, foods, dyes, or preservatives -pregnant or trying to get pregnant -breast-feeding How should I use this medicine? Take this medicine by mouth with a glass of water. Follow the directions on the prescription label. You may take this medicine with or without meals. When this medicine is used for erection problems, your doctor may prescribe it to be taken once daily or as needed. If you are taking the medicine as needed, you may be able to have sexual activity 30 minutes after taking it and for up to 36 hours after taking it. Whether you are taking the medicine as needed or once daily, you should not take more than one dose per day. If you are taking this medicine for symptoms of benign prostatic hyperplasia (BPH) or to treat both BPH and an erection  problem, take the dose once daily at about the same time each day. Do not take your medicine more often than directed. Talk to your pediatrician regarding the use of this medicine in children. Special care may be needed. Overdosage: If you think you have taken too much of this medicine contact a poison control center or emergency room at once. NOTE: This medicine is only for you. Do not share this medicine with others. What if I miss a dose? If you are taking this medicine as needed for erection problems, this does not apply. If you miss a dose while taking this medicine once daily for an erection problem, benign prostatic hyperplasia, or both, take it as soon as you remember, but do not take more than one dose per day. What may interact with this medicine? Do not take this medicine with any of the following medications: -nitrates like amyl nitrite, isosorbide dinitrate, isosorbide mononitrate, nitroglycerin -other medicines for erectile dysfunction like avanafil, sildenafil, vardenafil -other tadalafil products (Adcirca) -riociguat This medicine may also interact with the following medications: -certain drugs for high blood pressure -certain drugs for the treatment of HIV infection or AIDS -certain drugs used for fungal or yeast infections, like fluconazole, itraconazole, ketoconazole, and voriconazole -certain drugs used for seizures like carbamazepine, phenytoin, and phenobarbital -grapefruit juice -macrolide antibiotics like clarithromycin, erythromycin, troleandomycin -medicines for prostate problems -rifabutin, rifampin or rifapentine This list may not describe all possible interactions. Give your health care provider a list of all the medicines, herbs, non-prescription drugs, or dietary supplements you use. Also tell them if you smoke, drink alcohol, or use illegal drugs. Some items  may interact with your medicine. What should I watch for while using this medicine? If you notice any  changes in your vision while taking this drug, call your doctor or health care professional as soon as possible. Stop using this medicine and call your health care provider right away if you have a loss of sight in one or both eyes. Contact your doctor or health care professional right away if the erection lasts longer than 4 hours or if it becomes painful. This may be a sign of serious problem and must be treated right away to prevent permanent damage. If you experience symptoms of nausea, dizziness, chest pain or arm pain upon initiation of sexual activity after taking this medicine, you should refrain from further activity and call your doctor or health care professional as soon as possible. Do not drink alcohol to excess (examples, 5 glasses of wine or 5 shots of whiskey) when taking this medicine. When taken in excess, alcohol can increase your chances of getting a headache or getting dizzy, increasing your heart rate or lowering your blood pressure. Using this medicine does not protect you or your partner against HIV infection (the virus that causes AIDS) or other sexually transmitted diseases. What side effects may I notice from receiving this medicine? Side effects that you should report to your doctor or health care professional as soon as possible: -allergic reactions like skin rash, itching or hives, swelling of the face, lips, or tongue -breathing problems -changes in hearing -changes in vision -chest pain -fast, irregular heartbeat -prolonged or painful erection -seizures Side effects that usually do not require medical attention (report to your doctor or health care professional if they continue or are bothersome): -back pain -dizziness -flushing -headache -indigestion -muscle aches -nausea -stuffy or runny nose This list may not describe all possible side effects. Call your doctor for medical advice about side effects. You may report side effects to FDA at  1-800-FDA-1088. Where should I keep my medicine? Keep out of the reach of children. Store at room temperature between 15 and 30 degrees C (59 and 86 degrees F). Throw away any unused medicine after the expiration date. NOTE: This sheet is a summary. It may not cover all possible information. If you have questions about this medicine, talk to your doctor, pharmacist, or health care provider.    2016, Elsevier/Gold Standard. (2014-02-28 13:15:49)

## 2016-03-16 ENCOUNTER — Telehealth: Payer: Self-pay

## 2016-03-16 LAB — PSA: Prostate Specific Ag, Serum: 0.4 ng/mL (ref 0.0–4.0)

## 2016-03-16 NOTE — Telephone Encounter (Signed)
Spoke with pt in reference to PSA results. Pt voiced understanding.  

## 2016-03-16 NOTE — Telephone Encounter (Signed)
-----   Message from Nori Riis, PA-C sent at 03/16/2016  8:46 AM EDT ----- Patient's PSA is normal.

## 2016-03-17 ENCOUNTER — Other Ambulatory Visit: Payer: BLUE CROSS/BLUE SHIELD

## 2016-03-17 ENCOUNTER — Ambulatory Visit (AMBULATORY_SURGERY_CENTER): Payer: Self-pay | Admitting: *Deleted

## 2016-03-17 ENCOUNTER — Other Ambulatory Visit: Payer: Self-pay | Admitting: Family Medicine

## 2016-03-17 VITALS — Ht 70.0 in | Wt 167.0 lb

## 2016-03-17 DIAGNOSIS — E291 Testicular hypofunction: Secondary | ICD-10-CM

## 2016-03-17 DIAGNOSIS — N401 Enlarged prostate with lower urinary tract symptoms: Secondary | ICD-10-CM

## 2016-03-17 DIAGNOSIS — N529 Male erectile dysfunction, unspecified: Secondary | ICD-10-CM | POA: Insufficient documentation

## 2016-03-17 DIAGNOSIS — Z1211 Encounter for screening for malignant neoplasm of colon: Secondary | ICD-10-CM

## 2016-03-17 DIAGNOSIS — N138 Other obstructive and reflux uropathy: Secondary | ICD-10-CM | POA: Insufficient documentation

## 2016-03-17 DIAGNOSIS — N471 Phimosis: Secondary | ICD-10-CM | POA: Insufficient documentation

## 2016-03-17 MED ORDER — NA SULFATE-K SULFATE-MG SULF 17.5-3.13-1.6 GM/177ML PO SOLN: 1.0000 | Freq: Once | ORAL | Status: DC

## 2016-03-17 NOTE — Progress Notes (Signed)
No egg or soy allergy known to patient  No issues with past sedation with any surgeries  or procedures, no intubation problems  No diet pills per patient No home 02 use per patient  No blood thinners per patient  Pt denies issues with constipation  Pt has [employer] blue [place] - needs sample   Medication Samples have been provided to the patient.  Drug name: suprep           Qty: 1  LOTMA:[id number]  Exp.Date: 3-19   The patient has been instructed regarding the correct time, dose, and frequency of taking this medication, including desired effects and most common side effects.   Hetty Ely Widener 3:45 PM 03/17/2016

## 2016-03-18 ENCOUNTER — Telehealth: Payer: Self-pay

## 2016-03-18 ENCOUNTER — Encounter: Payer: Self-pay | Admitting: Gastroenterology

## 2016-03-18 LAB — TESTOSTERONE: Testosterone: 314 ng/dL — ABNORMAL LOW (ref 348–1197)

## 2016-03-18 NOTE — Telephone Encounter (Signed)
Labs were added.

## 2016-03-18 NOTE — Telephone Encounter (Signed)
Refill denied 

## 2016-03-18 NOTE — Telephone Encounter (Signed)
-----   Message from Nori Riis, PA-C sent at 03/18/2016  8:36 AM EDT ----- Patient's testosterone is low.  Would you add a FSH, LH and prolactin level to his blood work?

## 2016-03-19 LAB — SPECIMEN STATUS REPORT

## 2016-03-19 LAB — FSH/LH
FSH: 20.2 m[IU]/mL — ABNORMAL HIGH (ref 1.5–12.4)
LH: 8.3 m[IU]/mL (ref 1.7–8.6)

## 2016-03-19 LAB — PROLACTIN: Prolactin: 12.1 ng/mL (ref 4.0–15.2)

## 2016-03-22 ENCOUNTER — Telehealth: Payer: Self-pay | Admitting: Sports Medicine

## 2016-03-22 DIAGNOSIS — B351 Tinea unguium: Secondary | ICD-10-CM

## 2016-03-22 MED ORDER — TERBINAFINE HCL 250 MG PO TABS: 250.0000 mg | ORAL_TABLET | Freq: Every day | ORAL | Status: DC

## 2016-03-22 NOTE — Telephone Encounter (Signed)
Called patient with fungal culture results + T. Rubrum and S. Brevicalis Patient opt for oral lamisil. Previous labs within normal limits. Rx sent to pharmacy and educated patient on risks.  Patient to return to office for follow up in 6 weeks.

## 2016-03-31 ENCOUNTER — Encounter: Payer: Self-pay | Admitting: Gastroenterology

## 2016-03-31 ENCOUNTER — Ambulatory Visit (AMBULATORY_SURGERY_CENTER): Payer: BLUE CROSS/BLUE SHIELD | Admitting: Gastroenterology

## 2016-03-31 VITALS — BP 105/67 | HR 84 | Temp 97.8°F | Resp 17 | Ht 70.0 in | Wt 167.0 lb

## 2016-03-31 DIAGNOSIS — Z1211 Encounter for screening for malignant neoplasm of colon: Secondary | ICD-10-CM | POA: Diagnosis present

## 2016-03-31 DIAGNOSIS — D120 Benign neoplasm of cecum: Secondary | ICD-10-CM | POA: Diagnosis not present

## 2016-03-31 DIAGNOSIS — D123 Benign neoplasm of transverse colon: Secondary | ICD-10-CM | POA: Diagnosis not present

## 2016-03-31 MED ORDER — SODIUM CHLORIDE 0.9 % IV SOLN: 500.0000 mL | INTRAVENOUS | Status: DC

## 2016-03-31 NOTE — Progress Notes (Signed)
To PACU  Awake and alert.  Report to RN 

## 2016-03-31 NOTE — Op Note (Signed)
[facility] Patient Name: Michael Montgomery Procedure Date: 03/31/2016 11:06 AM MRN: [id number] Endoscopist: Remo Lipps P. Havery Moros , MD Age: 55 Referring MD:  Date of Birth: [birth year] Gender: Male Procedure:                Colonoscopy Indications:              Screening for malignant neoplasm in the colon, This                            is the patient's first colonoscopy Medicines:                Monitored Anesthesia Care Procedure:                Pre-Anesthesia Assessment:                           - Prior to the procedure, a History and Physical                            was performed, and patient medications and                            allergies were reviewed. The patient's tolerance of                            previous anesthesia was also reviewed. The risks                            and benefits of the procedure and the sedation                            options and risks were discussed with the patient.                            All questions were answered, and informed consent                            was obtained. Prior Anticoagulants: The patient has                            taken no previous anticoagulant or antiplatelet                            agents. ASA Grade Assessment: II - A patient with                            mild systemic disease. After reviewing the risks                            and benefits, the patient was deemed in                            satisfactory condition to undergo the procedure.  After obtaining informed consent, the colonoscope                            was passed under direct vision. Throughout the                            procedure, the patient's blood pressure, pulse, and                            oxygen saturations were monitored continuously. The                            Model CF-HQ190L [id number]) scope was introduced                            through the anus and advanced to the  the cecum,                            identified by appendiceal orifice and ileocecal                            valve. The colonoscopy was performed without                            difficulty. The patient tolerated the procedure                            well. The quality of the bowel preparation was                            good. The ileocecal valve, appendiceal orifice, and                            rectum were photographed. Scope In: 11:15:46 AM Scope Out: 11:32:21 AM Scope Withdrawal Time: 0 hours 14 minutes 3 seconds  Total Procedure Duration: 0 hours 16 minutes 35 seconds  Findings:                 The perianal and digital rectal examinations were                            normal.                           A diminutive polyp was found in the cecum. The                            polyp was sessile. The polyp was removed with a                            cold biopsy forceps. Resection and retrieval were                            complete.  A 6 mm polyp was found in the transverse colon. The                            polyp was sessile. The polyp was removed with a                            cold snare. Resection and retrieval were complete.                           Non-bleeding internal hemorrhoids were found during                            retroflexion.                           The exam was otherwise without abnormality. Complications:            No immediate complications. Estimated blood loss:                            Minimal. Estimated Blood Loss:     Estimated blood loss was minimal. Impression:               - One diminutive polyp in the cecum, removed with a                            cold biopsy forceps. Resected and retrieved.                           - One 6 mm polyp in the transverse colon, removed                            with a cold snare. Resected and retrieved.                           - Non-bleeding internal hemorrhoids.                            - The examination was otherwise normal. Recommendation:           - Patient has a contact number available for                            emergencies. The signs and symptoms of potential                            delayed complications were discussed with the                            patient. Return to normal activities tomorrow.                            Written discharge instructions were provided to the  patient.                           - Resume previous diet.                           - Continue present medications.                           - No aspirin, ibuprofen, naproxen, or other                            non-steroidal anti-inflammatory drugs for 2 weeks                            after polyp removal.                           - Await pathology results.                           - Repeat colonoscopy is recommended for                            surveillance. The colonoscopy date will be                            determined after pathology results from today's                            exam become available for review. Remo Lipps P. Junie Avilla, MD 03/31/2016 11:36:18 AM This report has been signed electronically.

## 2016-03-31 NOTE — Progress Notes (Signed)
Called to room to assist during endoscopic procedure.  Patient ID and intended procedure confirmed with present staff. Received instructions for my participation in the procedure from the performing physician.  

## 2016-03-31 NOTE — Patient Instructions (Signed)
Impressions/recommendations:  Polyps (handout given) Hemorrhoids (handout given)  YOU HAD AN ENDOSCOPIC PROCEDURE TODAY AT THE [facility]:   Refer to the procedure report that was given to you for any specific questions about what was found during the examination.  If the procedure report does not answer your questions, please call your gastroenterologist to clarify.  If you requested that your care partner not be given the details of your procedure findings, then the procedure report has been included in a sealed envelope for you to review at your convenience later.  YOU SHOULD EXPECT: Some feelings of bloating in the abdomen. Passage of more gas than usual.  Walking can help get rid of the air that was put into your GI tract during the procedure and reduce the bloating. If you had a lower endoscopy (such as a colonoscopy or flexible sigmoidoscopy) you may notice spotting of blood in your stool or on the toilet paper. If you underwent a bowel prep for your procedure, you may not have a normal bowel movement for a few days.  Please Note:  You might notice some irritation and congestion in your nose or some drainage.  This is from the oxygen used during your procedure.  There is no need for concern and it should clear up in a day or so.  SYMPTOMS TO REPORT IMMEDIATELY:   Following lower endoscopy (colonoscopy or flexible sigmoidoscopy):  Excessive amounts of blood in the stool  Significant tenderness or worsening of abdominal pains  Swelling of the abdomen that is new, acute  Fever of 100F or higher   For urgent or emergent issues, a gastroenterologist can be reached at any hour by calling [phone].   DIET: Your first meal following the procedure should be a small meal and then it is ok to progress to your normal diet. Heavy or fried foods are harder to digest and may make you feel nauseous or bloated.  Likewise, meals heavy in dairy and vegetables can increase bloating.   Drink plenty of fluids but you should avoid alcoholic beverages for 24 hours.  ACTIVITY:  You should plan to take it easy for the rest of today and you should NOT DRIVE or use heavy machinery until tomorrow (because of the sedation medicines used during the test).    FOLLOW UP: Our staff will call the number listed on your records the next business day following your procedure to check on you and address any questions or concerns that you may have regarding the information given to you following your procedure. If we do not reach you, we will leave a message.  However, if you are feeling well and you are not experiencing any problems, there is no need to return our call.  We will assume that you have returned to your regular daily activities without incident.  If any biopsies were taken you will be contacted by phone or by letter within the next 1-3 weeks.  Please call us at [phone] if you have not heard about the biopsies in 3 weeks.    SIGNATURES/CONFIDENTIALITY: You and/or your care partner have signed paperwork which will be entered into your electronic medical record.  These signatures attest to the fact that that the information above on your After Visit Summary has been reviewed and is understood.  Full responsibility of the confidentiality of this discharge information lies with you and/or your care-partner. 

## 2016-04-01 ENCOUNTER — Telehealth: Payer: Self-pay | Admitting: *Deleted

## 2016-04-01 NOTE — Telephone Encounter (Signed)
  Follow up Call-  Call back number 03/31/2016  Post procedure Call Back phone  # [phone]  Permission to leave phone message Yes     Patient questions:  Do you have a fever, pain , or abdominal swelling? No. Pain Score  0 *  Have you tolerated food without any problems? Yes.    Have you been able to return to your normal activities? Yes.    Do you have any questions about your discharge instructions: Diet   No. Medications  No. Follow up visit  No.  Do you have questions or concerns about your Care? No.  Actions: * If pain score is 4 or above: No action needed, pain <4.

## 2016-04-04 ENCOUNTER — Other Ambulatory Visit: Payer: Self-pay | Admitting: Neurology

## 2016-04-04 DIAGNOSIS — G6181 Chronic inflammatory demyelinating polyneuritis: Secondary | ICD-10-CM

## 2016-04-11 ENCOUNTER — Ambulatory Visit: Payer: BLUE CROSS/BLUE SHIELD | Admitting: Physical Therapy

## 2016-04-11 ENCOUNTER — Inpatient Hospital Stay
Admission: AD | Admit: 2016-04-11 | Discharge: 2016-04-14 | DRG: 074 | Disposition: A | Discharge status: Home / Self Care | Payer: BLUE CROSS/BLUE SHIELD | Source: Ambulatory Visit | Attending: Internal Medicine | Admitting: Internal Medicine

## 2016-04-11 ENCOUNTER — Encounter: Payer: Self-pay | Admitting: Gastroenterology

## 2016-04-11 ENCOUNTER — Ambulatory Visit
Admission: RE | Admit: 2016-04-11 | Discharge: 2016-04-11 | Disposition: A | Discharge status: Home / Self Care | Payer: BLUE CROSS/BLUE SHIELD | Source: Ambulatory Visit | Attending: Neurology | Admitting: Neurology

## 2016-04-11 DIAGNOSIS — I10 Essential (primary) hypertension: Secondary | ICD-10-CM | POA: Diagnosis present

## 2016-04-11 DIAGNOSIS — E1144 Type 2 diabetes mellitus with diabetic amyotrophy: Secondary | ICD-10-CM | POA: Diagnosis present

## 2016-04-11 DIAGNOSIS — E7800 Pure hypercholesterolemia, unspecified: Secondary | ICD-10-CM | POA: Diagnosis present

## 2016-04-11 DIAGNOSIS — Z79899 Other long term (current) drug therapy: Secondary | ICD-10-CM | POA: Diagnosis not present

## 2016-04-11 DIAGNOSIS — Z7984 Long term (current) use of oral hypoglycemic drugs: Secondary | ICD-10-CM | POA: Diagnosis not present

## 2016-04-11 DIAGNOSIS — Z8711 Personal history of peptic ulcer disease: Secondary | ICD-10-CM | POA: Diagnosis not present

## 2016-04-11 DIAGNOSIS — Z833 Family history of diabetes mellitus: Secondary | ICD-10-CM | POA: Diagnosis not present

## 2016-04-11 DIAGNOSIS — Z8249 Family history of ischemic heart disease and other diseases of the circulatory system: Secondary | ICD-10-CM

## 2016-04-11 DIAGNOSIS — G6181 Chronic inflammatory demyelinating polyneuritis: Principal | ICD-10-CM | POA: Diagnosis present

## 2016-04-11 DIAGNOSIS — F17290 Nicotine dependence, other tobacco product, uncomplicated: Secondary | ICD-10-CM | POA: Diagnosis present

## 2016-04-11 DIAGNOSIS — Z808 Family history of malignant neoplasm of other organs or systems: Secondary | ICD-10-CM

## 2016-04-11 LAB — COMPREHENSIVE METABOLIC PANEL
ALT: 17 U/L (ref 17–63)
AST: 19 U/L (ref 15–41)
Albumin: 4.2 g/dL (ref 3.5–5.0)
Alkaline Phosphatase: 94 U/L (ref 38–126)
Anion gap: 8 (ref 5–15)
BUN: 15 mg/dL (ref 6–20)
CO2: 28 mmol/L (ref 22–32)
Calcium: 9.2 mg/dL (ref 8.9–10.3)
Chloride: 104 mmol/L (ref 101–111)
Creatinine, Ser: 0.63 mg/dL (ref 0.61–1.24)
GFR calc Af Amer: >60 mL/min (ref >60)
GFR calc non Af Amer: >60 mL/min (ref >60)
Glucose, Bld: 151 mg/dL — ABNORMAL HIGH (ref 65–99)
Potassium: 3.7 mmol/L (ref 3.5–5.1)
Sodium: 140 mmol/L (ref 135–145)
Total Bilirubin: 0.5 mg/dL (ref 0.3–1.2)
Total Protein: 7.1 g/dL (ref 6.5–8.1)

## 2016-04-11 LAB — CBC
HCT: 43.4 % (ref 40.0–52.0)
Hemoglobin: 14.5 g/dL (ref 13.0–18.0)
MCH: 29.4 pg (ref 26.0–34.0)
MCHC: 33.5 g/dL (ref 32.0–36.0)
MCV: 88 fL (ref 80.0–100.0)
Platelets: 211 10*3/uL (ref 150–440)
RBC: 4.92 MIL/uL (ref 4.40–5.90)
RDW: 14.1 % (ref 11.5–14.5)
WBC: 5.4 10*3/uL (ref 3.8–10.6)

## 2016-04-11 LAB — CSF CELL COUNT WITH DIFFERENTIAL
Eosinophils, CSF: 0 %
Lymphs, CSF: 86 %
Monocyte-Macrophage-Spinal Fluid: 7 %
Other Cells, CSF: 0
RBC Count, CSF: 30 /mm3 — ABNORMAL HIGH (ref 0–3)
Segmented Neutrophils-CSF: 7 %
Tube #: 2
WBC, CSF: 9 /mm3

## 2016-04-11 LAB — GLUCOSE, RANDOM: Glucose, Bld: 151 mg/dL — ABNORMAL HIGH (ref 65–99)

## 2016-04-11 LAB — GLUCOSE, CAPILLARY: Glucose-Capillary: 134 mg/dL — ABNORMAL HIGH (ref 65–99)

## 2016-04-11 LAB — APTT: aPTT: 27 seconds (ref 24–36)

## 2016-04-11 LAB — PROTIME-INR
INR: 0.93
Prothrombin Time: 12.7 seconds (ref 11.4–15.0)

## 2016-04-11 LAB — GLUCOSE, CSF: Glucose, CSF: 63 mg/dL (ref 40–70)

## 2016-04-11 LAB — PROTEIN, CSF: Total  Protein, CSF: 87 mg/dL — ABNORMAL HIGH (ref 15–45)

## 2016-04-11 MED ORDER — INSULIN ASPART 100 UNIT/ML ~~LOC~~ SOLN
0.0000 [IU] | Freq: Every day | SUBCUTANEOUS | Status: DC
  Filled 2016-04-11 (×2): qty 1

## 2016-04-11 MED ORDER — INSULIN ASPART 100 UNIT/ML ~~LOC~~ SOLN
0.0000 [IU] | Freq: Three times a day (TID) | SUBCUTANEOUS | Status: DC
  Administered 2016-04-12 – 2016-04-13 (×3): 1 [IU] via SUBCUTANEOUS
  Administered 2016-04-14: 2 [IU] via SUBCUTANEOUS
  Administered 2016-04-14: 1 [IU] via SUBCUTANEOUS
  Filled 2016-04-11: qty 1
  Filled 2016-04-11: qty 2
  Filled 2016-04-11: qty 1

## 2016-04-11 MED ORDER — ACETAMINOPHEN 500 MG PO TABS
1000.0000 mg | ORAL_TABLET | Freq: Four times a day (QID) | ORAL | Status: DC | PRN
  Filled 2016-04-11: qty 2

## 2016-04-11 MED ORDER — AMLODIPINE BESYLATE 10 MG PO TABS
10.0000 mg | ORAL_TABLET | Freq: Every day | ORAL | Status: DC
  Administered 2016-04-12 – 2016-04-14 (×3): 10 mg via ORAL
  Filled 2016-04-11 (×3): qty 1

## 2016-04-11 MED ORDER — ALBUTEROL SULFATE (2.5 MG/3ML) 0.083% IN NEBU: 2.5000 mg | INHALATION_SOLUTION | RESPIRATORY_TRACT | Status: DC | PRN

## 2016-04-11 MED ORDER — GABAPENTIN 300 MG PO CAPS
300.0000 mg | ORAL_CAPSULE | Freq: Three times a day (TID) | ORAL | Status: DC
Start: 2016-04-11 — End: 2016-04-14
  Administered 2016-04-11 – 2016-04-14 (×8): 300 mg via ORAL
  Filled 2016-04-11 (×8): qty 1

## 2016-04-11 MED ORDER — ONDANSETRON HCL 4 MG/2ML IJ SOLN: 4.0000 mg | Freq: Four times a day (QID) | INTRAMUSCULAR | Status: DC | PRN

## 2016-04-11 MED ORDER — ONDANSETRON HCL 4 MG PO TABS: 4.0000 mg | ORAL_TABLET | Freq: Four times a day (QID) | ORAL | Status: DC | PRN

## 2016-04-11 MED ORDER — SODIUM CHLORIDE 0.9 % IV SOLN: 250.0000 mL | INTRAVENOUS | Status: DC | PRN

## 2016-04-11 MED ORDER — ATORVASTATIN CALCIUM 20 MG PO TABS
40.0000 mg | ORAL_TABLET | Freq: Every day | ORAL | Status: DC
  Administered 2016-04-12 – 2016-04-14 (×3): 40 mg via ORAL
  Filled 2016-04-11 (×3): qty 2

## 2016-04-11 MED ORDER — ACETAMINOPHEN 650 MG RE SUPP: 650.0000 mg | Freq: Four times a day (QID) | RECTAL | Status: DC | PRN

## 2016-04-11 MED ORDER — ENOXAPARIN SODIUM 40 MG/0.4ML ~~LOC~~ SOLN
40.0000 mg | SUBCUTANEOUS | Status: DC
  Administered 2016-04-11 – 2016-04-13 (×3): 40 mg via SUBCUTANEOUS
  Filled 2016-04-11 (×3): qty 0.4

## 2016-04-11 MED ORDER — SODIUM CHLORIDE 0.9% FLUSH
3.0000 mL | Freq: Two times a day (BID) | INTRAVENOUS | Status: DC
  Administered 2016-04-12 – 2016-04-13 (×3): 3 mL via INTRAVENOUS

## 2016-04-11 MED ORDER — SODIUM CHLORIDE 0.9% FLUSH: 3.0000 mL | INTRAVENOUS | Status: DC | PRN

## 2016-04-11 MED ORDER — ACETAMINOPHEN 325 MG PO TABS
650.0000 mg | ORAL_TABLET | Freq: Four times a day (QID) | ORAL | Status: DC | PRN
  Administered 2016-04-12: 650 mg via ORAL
  Filled 2016-04-11: qty 2

## 2016-04-11 MED ORDER — IMMUNE GLOBULIN (HUMAN) 5 GM/50ML IV SOLN: 1.0000 g/kg | INTRAVENOUS | Status: DC

## 2016-04-11 MED ORDER — IMMUNE GLOBULIN (HUMAN) 20 GM/200ML IV SOLN
500.0000 mg/kg | INTRAVENOUS | Status: DC
  Administered 2016-04-11 – 2016-04-13 (×3): 40 g via INTRAVENOUS
  Filled 2016-04-11 (×4): qty 400

## 2016-04-11 MED ORDER — METFORMIN HCL 500 MG PO TABS
500.0000 mg | ORAL_TABLET | Freq: Two times a day (BID) | ORAL | Status: DC
  Administered 2016-04-12 – 2016-04-14 (×5): 500 mg via ORAL
  Filled 2016-04-11 (×5): qty 1

## 2016-04-11 MED ORDER — BISACODYL 10 MG RE SUPP: 10.0000 mg | Freq: Every day | RECTAL | Status: DC | PRN

## 2016-04-11 MED ORDER — LISINOPRIL 10 MG PO TABS
10.0000 mg | ORAL_TABLET | Freq: Every day | ORAL | Status: DC
  Administered 2016-04-12 – 2016-04-14 (×3): 10 mg via ORAL
  Filled 2016-04-11 (×3): qty 1

## 2016-04-11 MED ORDER — POLYETHYLENE GLYCOL 3350 17 G PO PACK
17.0000 g | PACK | Freq: Every day | ORAL | Status: DC | PRN
  Administered 2016-04-13 – 2016-04-14 (×2): 17 g via ORAL
  Filled 2016-04-11 (×2): qty 1

## 2016-04-11 NOTE — Discharge Instructions (Signed)
Lumbar Puncture A lumbar puncture, or spinal tap, is a procedure in which a small amount of the fluid that surrounds the brain and spinal cord is removed and examined. The fluid is called the cerebrospinal fluid. This procedure may be done to:   Help diagnose various problems, such as meningitis, encephalitis, multiple sclerosis, and AIDS.   Remove fluid and relieve pressure that occurs with certain types of headaches.   Look for bleeding within the brain and spinal cord areas (central nervous system).   Place medicine into the spinal fluid.  LET [facility] CARE PROVIDER KNOW ABOUT:  Any allergies you have.  All medicines you are taking, including vitamins, herbs, eye drops, creams, and over-the-counter medicines.  Previous problems you or members of your family have had with the use of anesthetics.  Any blood disorders you have.  Previous surgeries you have had.  Medical conditions you have. RISKS AND COMPLICATIONS Generally, this is a safe procedure. However, as with any procedure, complications can occur. Possible complications include:   Spinal headache. This is a severe headache that occurs when there is a leak of spinal fluid. A spinal headache causes discomfort but is not dangerous. If it persists, another procedure may be done to treat the headache.  Bleeding. This most often occurs in people with bleeding disorders. These are disorders in which the blood does not clot normally.   Infection at the insertion site that can spread to the bone or spinal fluid.  Formation of a spinal cord tumor (rare).  Brain herniation or movement of the brain into the spinal cord (rare).  Inability to move (extremely rare). BEFORE THE PROCEDURE  You may have blood tests done. These tests can help tell how well your kidneys and liver are working. They can also show how well your blood clots.   If you take blood thinners (anticoagulant medicine), ask your health care provider if  and when you should stop taking them.   Your health care provider may order a CT scan of your brain.  Make arrangements for someone to drive you home after the procedure.  PROCEDURE  You will be positioned so that the spaces between the bones of the spine (vertebrae) are as wide as possible. This will make it easier to pass the needle into the spinal canal.  Depending on your age and size, you may lie on your side, curled up with your knees under your chin. Or, you may sit with your head resting on a pillow that is placed at waist level.  The skin covering the lower back (or lumbar region) will be cleaned.   The skin may be numbed with medicine.  You may be given pain medicine or a medicine to help you relax (sedative).  A small needle will be inserted in the skin until it enters the space that contains the spinal fluid. The needle will not enter the spinal cord.   The spinal fluid will be collected into tubes.   The needle will be withdrawn, and a bandage will be placed on the site.  AFTER THE PROCEDURE  You will remain lying down for 2 hours or for as long as your health care provider suggests.   The spinal fluid will be sent to a laboratory to be examined. . A test, called a culture, may be taken of the spinal fluid if your health care provider thinks you have an infection. If cultures were taken for exam, the results will usually be available in a  couple of days.  Lie flat as much as possible the rest of today. No heavy lifting.   This information is not intended to replace advice given to you by your health care provider. Make sure you discuss any questions you have with your health care provider.   Document Released: 10/07/2000 Document Revised: 07/31/2013 Document Reviewed: 06/17/2013 Elsevier Interactive Patient Education [employer].

## 2016-04-11 NOTE — H&P (Signed)
[facility] NAME: Michael Montgomery    MR#:  [id number]  DATE OF BIRTH:  1961/01/25  DATE OF ADMISSION:  04/11/2016  PRIMARY CARE PHYSICIAN: [name], MD   REQUESTING/REFERRING PHYSICIAN: Dr. Jennings Books  CHIEF COMPLAINT:  No chief complaint on file.  Focal weakness CIDP  HISTORY OF PRESENT ILLNESS:  Michael Montgomery  is a 55 y.o. male with a known history of DM, HTN, Neuropathy had LP earlier today and sent as a direct admission due to concern for CIDP. Needs IVIG treatment. Case discussed with Neurology Dr. Manuella Ghazi. Patient started noticing his symptoms with worsening weakness and numbness in left lower extremity. Initially diabetic neuropathy was suspected. He was started on Neurontin. An MRI was checked which was normal. EMG's were done. Today patient had LP his protein was found to be elevated at 86. Patient has had progressively worsening weakness. Ambulates with a cane. 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  Blood sugars and blood pressure is well controlled at home as per patient. No recent change in medications.  PAST MEDICAL HISTORY:   Past Medical History  Diagnosis Date  . Chickenpox   . Gastric ulcer   . High blood pressure   . Diabetes mellitus without complication ([place])   . Heart murmur     When he was younger  . Hypercholesteremia   . Knee injury     left  . Motion sickness     boats  . Cataract     bilateral removed   . Diabetic amyotrophy [facility])     sees dr at [facility]    PAST SURGICAL HISTORY:   Past Surgical History  Procedure Laterality Date  . Cataract extraction w/phaco Right 02/01/2016    Procedure: CATARACT EXTRACTION PHACO AND INTRAOCULAR LENS PLACEMENT (Central) right;  Surgeon: Ronnell Freshwater, MD;  Location: [place];  Service: Ophthalmology;  Laterality: Right;  DIABETIC - oral meds  . Cataract extraction w/phaco Left 03/07/2016     Procedure: CATARACT EXTRACTION PHACO AND INTRAOCULAR LENS PLACEMENT (IOC);  Surgeon: Ronnell Freshwater, MD;  Location: [place];  Service: Ophthalmology;  Laterality: Left;  DIABETIC - oral meds   . Eye surgery      SOCIAL HISTORY:   Social History  Substance Use Topics  . Smoking status: Current Some Day Smoker    Types: Cigars  . Smokeless tobacco: Former Systems developer     Comment: one cigar once a month to once every 2 months   . Alcohol Use: 0.0 oz/week    0 Standard drinks or equivalent per week     Comment: rare    FAMILY HISTORY:   Family History  Problem Relation Age of Onset  . Alcoholism Father   . Hyperlipidemia Father   . Hypertension Father   . Throat cancer Father     smoked but had stopped 20 yeras before dx  . Diabetes Mother   . Kidney disease Neg Hx   . Prostate cancer Neg Hx   . Colon cancer Neg Hx   . Colon polyps Neg Hx   . Esophageal cancer Neg Hx   . Rectal cancer Neg Hx   . Stomach cancer Neg Hx     DRUG ALLERGIES:  No Known Allergies  REVIEW OF SYSTEMS:   Review of Systems  Constitutional: Positive for malaise/fatigue. Negative for fever, chills and weight loss.  HENT: Negative  for hearing loss and nosebleeds.   Eyes: Negative for blurred vision, double vision and pain.  Respiratory: Negative for cough, hemoptysis, sputum production, shortness of breath and wheezing.   Cardiovascular: Negative for chest pain, palpitations, orthopnea and leg swelling.  Gastrointestinal: Negative for nausea, vomiting, abdominal pain, diarrhea and constipation.  Genitourinary: Negative for dysuria and hematuria.  Musculoskeletal: Positive for back pain. Negative for myalgias and falls.  Skin: Negative for rash.  Neurological: Positive for sensory change and focal weakness. Negative for dizziness, tremors, speech change, seizures and headaches.  Endo/Heme/Allergies: Does not bruise/bleed easily.  Psychiatric/Behavioral: Negative for depression  and memory loss. The patient is not nervous/anxious.     MEDICATIONS AT HOME:   Prior to Admission medications   Medication Sig Start Date End Date Taking? Authorizing Provider  amLODipine (NORVASC) 10 MG tablet Take 1 tablet (10 mg total) by mouth daily. 02/04/16  Yes Leone Haven, MD  atorvastatin (LIPITOR) 40 MG tablet Take 1 tablet (40 mg total) by mouth daily. 01/22/16  Yes Leone Haven, MD  gabapentin (NEURONTIN) 300 MG capsule Take by mouth 3 (three) times daily. Reported on 03/15/2016 03/15/16 04/14/16 Yes Historical Provider, MD  lisinopril (PRINIVIL,ZESTRIL) 5 MG tablet Take 0.5 tablets (2.5 mg total) by mouth daily. Patient taking differently: Take 10 mg by mouth daily.  02/11/16  Yes Leone Haven, MD  metFORMIN (GLUCOPHAGE) 500 MG tablet Please take 1000 mg (2 tablets) in the morning and 500 mg (1 tablet) at night by mouth 02/04/16  Yes Leone Haven, MD  Multiple Vitamin (MULTIVITAMIN) capsule Take 1 capsule by mouth daily.   Yes Historical Provider, MD  nystatin-triamcinolone ointment (MYCOLOG) Apply 1 application topically 2 (two) times daily. 03/15/16  Yes Shannon A McGowan, PA-C  terbinafine (LAMISIL) 250 MG tablet Take 1 tablet (250 mg total) by mouth daily. 03/22/16  Yes Landis Martins, DPM     VITAL SIGNS:  Blood pressure 152/82, pulse 92, temperature 98.5 F (36.9 C), temperature source Oral, resp. rate 17, SpO2 100 %.  PHYSICAL EXAMINATION:  Physical Exam  GENERAL:  55 y.o.-year-old patient lying in the bed with no acute distress.  EYES: Pupils equal, round, reactive to light and accommodation. No scleral icterus. Extraocular muscles intact.  HEENT: Head atraumatic, normocephalic. Oropharynx and nasopharynx clear. No oropharyngeal erythema, moist oral mucosa  NECK:  Supple, no jugular venous distention. No thyroid enlargement, no tenderness.  LUNGS: Normal breath sounds bilaterally, no wheezing, rales, rhonchi. No use of accessory muscles of respiration.   CARDIOVASCULAR: S1, S2 normal. No murmurs, rubs, or gallops.  ABDOMEN: Soft, nontender, nondistended. Bowel sounds present. No organomegaly or mass.  EXTREMITIES: No pedal edema, cyanosis, or clubbing. + 2 pedal & radial pulses b/l.   NEUROLOGIC: Cranial nerves II through XII are intact. Decreased sensation left lower extremity. Motor strength 4-/5 in the left lower extremity. Upper extremities and right lower extremity normal PSYCHIATRIC: The patient is alert and oriented x 3. Good affect.  SKIN: No obvious rash, lesion, or ulcer.   LABORATORY PANEL:   CBC  Recent Labs Lab 04/11/16 0750  WBC 5.4  HGB 14.5  HCT 43.4  PLT 211   ------------------------------------------------------------------------------------------------------------------  Chemistries   Recent Labs Lab 04/11/16 0750  GLUCOSE 151*   ------------------------------------------------------------------------------------------------------------------  Cardiac Enzymes No results for input(s): TROPONINI in the last 168 hours. ------------------------------------------------------------------------------------------------------------------  RADIOLOGY:  Dg Fluoro Guided Loc Of Needle/cath Tip For Spinal Inject Lt  04/11/2016  CLINICAL DATA:  Presumptive diagnosis of chronic inflammatory demyelinating  polyneuropathy, spinal fluid evaluation needed. EXAM: DIAGNOSTIC LUMBAR PUNCTURE UNDER FLUOROSCOPIC GUIDANCE FLUOROSCOPY TIME:  Fluoroscopy Time (in minutes and seconds): Number of Acquired Images:  1 PROCEDURE: Informed consent was obtained from the patient prior to the procedure, including potential complications of headache, allergy, and pain. With the patient prone, the lower back was prepped with Betadine. 1% Lidocaine was used for local anesthesia. Lumbar puncture was performed at the L3 level initially using a 20 gauge needle. No fluid was obtained at this level and the level below, L4, was then anesthetized with  lidocaine. A 20 gauge spinal needle was placed into the lumbar subarachnoid space with return of clear, colorless CSF. 5 ml of CSF were obtained for laboratory studies. The patient tolerated the procedure well and there were no apparent complications. IMPRESSION: Successful lumbar puncture for acquisition of spinal fluid for laboratory analysis as per Dr. Manuella Ghazi. Electronically Signed   By: David  Martinique M.D.   On: 04/11/2016 10:24     IMPRESSION AND PLAN:   * CIDP IVIG 500mg /kg x 4 days Consult Neurology PT consult  * DM2 Metformin SSI HbA1c  * HTN Continue home medications Lisinopril and amlodipine  * DVT prophylaxis Lovenox  All the records are reviewed and case discussed with ED provider. Management plans discussed with the patient, family and they are in agreement.  CODE STATUS: FULL CODE  TOTAL TIME TAKING CARE OF THIS PATIENT: 40 minutes.   Hillary Bow R M.D on 04/11/2016 at 6:13 PM  Between 7am to 6pm - Pager - [phone]  After 6pm go to [email or web address] - password [email or web address] [facility]  Office  [phone]  CC: Primary care physician; [name], MD  Note: This dictation was prepared with Dragon dictation along with smaller phrase technology. Any transcriptional errors that result from this process are unintentional.

## 2016-04-12 DIAGNOSIS — G6181 Chronic inflammatory demyelinating polyneuritis: Principal | ICD-10-CM

## 2016-04-12 LAB — GLUCOSE, CAPILLARY
Glucose-Capillary: 109 mg/dL — ABNORMAL HIGH (ref 65–99)
Glucose-Capillary: 139 mg/dL — ABNORMAL HIGH (ref 65–99)
Glucose-Capillary: 91 mg/dL (ref 65–99)
Glucose-Capillary: 116 mg/dL — ABNORMAL HIGH (ref 65–99)

## 2016-04-12 LAB — IGG 4: IgG, Subclass 4: 48 mg/dL (ref 2–96)

## 2016-04-12 LAB — HEMOGLOBIN A1C: Hgb A1c MFr Bld: 5.8 % (ref 4.0–6.0)

## 2016-04-12 LAB — VITAMIN B12: Vitamin B-12: 426 pg/mL (ref 180–914)

## 2016-04-12 NOTE — Care Management Note (Signed)
Case Management Note  Patient Details  Name: Michael Montgomery MRN: [id number] Date of Birth: 08/12/1961  Subjective/Objective:                  Met with patient to discuss discharge planning. Patient is from home with his wife. He has been dependent on cane for mobility. He denies owning a walker. His PCP is with [facility] in [place]. He states his meds are expensive but he has been able to obtain them through Agency. He states he is currently unemployed but paying for health insurance privately.   Action/Plan: List of home health agencies provided to patient. RNCM will follow for [facility] and DME needs.   Expected Discharge Date:  04/13/16               Expected Discharge Plan:     In-House Referral:     Discharge planning Services  CM Consult  Post Acute Care Choice:  Home Health, Durable Medical Equipment Choice offered to:  Patient  DME Arranged:    DME Agency:     HH Arranged:    [place] Agency:     Status of Service:  In process, will continue to follow  Medicare Important Message Given:    Date Medicare IM Given:    Medicare IM give by:    Date Additional Medicare IM Given:    Additional Medicare Important Message give by:     If discussed at [place] of Stay Meetings, dates discussed:    Additional Comments:  Marshell Garfinkel, RN 04/12/2016, 10:54 AM

## 2016-04-12 NOTE — Care Management (Addendum)
Received call from Melvin with [facility] stating that IVIG has not been authorized. MD updated. Patient is a direct admit from Dr. Jennings Books.

## 2016-04-12 NOTE — Progress Notes (Signed)
Physical Therapy Evaluation Patient Details Name: Michael Montgomery MRN: [id number] DOB: 06/10/1961 Today's Date: 04/12/2016   History of Present Illness  Pt is a pleasant 55 yr old male presenting with progressive LE weakness. Admitted for Chronic Inflammatory Demyelinating Polyneuropathy to undergo IVIG treatment. PMH significant for HTN, DM, and neuropathy.    Clinical Impression  Prior to admission, pt was mod I with all functional mobility and ADLs using a single point cane. Pt lives with his spouse in a 1-level home with 7 steps (bilat railing) to enter. Currently, pt is supervision for bed mobility, min A for sit>stand transfers, and CGA for ambulation x 12ft with SPC. Pt demonstrates decreased strength (3+/5 hip flexion, knee flexion, knee extension) and diminished light touch sensation in the LLE. Pt would benefit from skilled PT for strengthening, transfer/gait/stair training, and DME education. Recommend pt discharge home with outpatient PT when medically appropriate.     Follow Up Recommendations Outpatient PT    Equipment Recommendations  Has single point cane at home   Recommendations for Other Services       Precautions / Restrictions Precautions Precautions: Fall Restrictions Weight Bearing Restrictions: No      Mobility  Bed Mobility Overal bed mobility: Needs Assistance Bed Mobility: Supine to Sit Supine to sit: Supervision;HOB elevated  General bed mobility comments: Increased time required. Uses UEs to assist LLE.  Transfers Overall transfer level: Needs assistance Equipment used: Straight cane (with RUE) Transfers: Sit to/from Stand (x 2 trials) Sit to Stand: Min guard;Min assist General transfer comment: Sit > stand transfer with increased effort requiring min A. Pt with decreased weight bearing through LLE. Stand > sit transfer requires only CGA though demonstrates decreased eccentric control. Vc's for sequencing and posture. Increased time  required.  Ambulation/Gait Ambulation/Gait assistance: Min guard Ambulation Distance (Feet): 40 Feet Assistive device: Straight cane (with RUE) Gait Pattern/deviations: WFL(Within Functional Limits);Step-through pattern Gait velocity: decreased General Gait Details: Pt requiring DME instruction for use of SPC on the L (had previously been using it on the R). Increased time required.  Stairs Did not attempt; will need to assess prior to d/c  Wheelchair Mobility    Modified Rankin (Stroke Patients Only)       Balance Overall balance assessment: Needs assistance Sitting-balance support: Bilateral upper extremity supported;Feet supported Sitting balance-Leahy Scale: Good Sitting balance - Comments: Maintains static sitting EOB with feet and bilat UEs supported with close standby assist; accepts moderate challenge of MMT without LOB   Standing balance support: Single extremity supported (on SPC) Standing balance-Leahy Scale: Fair Standing balance comment: Requires min A initially upon stand for steadying, but ambualtes with SPC with only CGA.      Pertinent Vitals/Pain Pain Assessment: 0-10 Pain Score: 7  Pain Location: bilat legs Pain Intervention(s): Limited activity within patient's tolerance;Monitored during session;Premedicated before session;Repositioned; RN aware    Home Living Family/patient expects to be discharged to:: Private residence Living Arrangements: Spouse/significant other Type of Home: House Home Access: Stairs to enter Entrance Stairs-Rails: Can reach both Entrance Stairs-Number of Steps: 7 Home Layout: One level Home Equipment: Cane - single point      Prior Function Level of Independence: Independent with assistive device (using single point cane x past 6 months)       Hand Dominance   Dominant Hand: Right    Extremity/Trunk Assessment   Upper Extremity Assessment: Overall WFL for tasks assessed (decreased grip strength LUE)   Lower  Extremity Assessment: RLE deficits/detail;LLE deficits/detail RLE Deficits /  Details: Hip flexion 4/5; knee flexion/extension and PF/DF all 4+/5,  LLE Deficits / Details: Hip flexion 3+/5; knee flexion/extension 3+/5; PF/DF 4-/5   Diminished light touch sensation throughout LLE. Coordination (heel to shin) in tact bilat, though requires increased time on L secondary to weakness. No tone noted throughout extremities.  Cervical / Trunk Assessment: Normal    Communication   Communication: No difficulties  Cognition Arousal/Alertness: Awake/alert Behavior During Therapy: WFL for tasks assessed/performed Overall Cognitive Status: Within Functional Limits for tasks assessed    General Comments Nursing cleared pt for participation in physical therapy.  Pt resting supine upon room entry and agreeable to PT session.     Exercises General Exercises - Lower Extremity Ankle Circles/Pumps: AROM;Strengthening;Both;10 reps;Supine Quad Sets: AROM;Strengthening;Both;10 reps;Supine Heel Slides: AROM;Strengthening;Both;10 reps;Supine      Assessment/Plan    PT Assessment Patient needs continued PT services  PT Diagnosis Difficulty walking;Generalized weakness   PT Problem List Decreased strength;Decreased balance;Decreased mobility;Decreased knowledge of use of DME  PT Treatment Interventions DME instruction;Gait training;Stair training;Functional mobility training;Therapeutic activities;Balance training;Therapeutic exercise   PT Goals (Current goals can be found in the Care Plan section) Acute Rehab PT Goals Patient Stated Goal: To go home PT Goal Formulation: With patient Time For Goal Achievement: 04/26/16 Potential to Achieve Goals: Good    Frequency 7X/week   Barriers to discharge Decreased strength; 7 steps for home entry       End of Session Equipment Utilized During Treatment: Gait belt Activity Tolerance: Patient tolerated treatment well Patient left: in chair;with call  bell/phone within reach;with chair alarm set;with family/visitor present Nurse Communication: Mobility status         Time: 1137-1206 PT Time Calculation (min) (ACUTE ONLY): 29 min   Charges:         PT G Codes:        Calleen Alvis, SPT 04/12/2016, 1:23 PM

## 2016-04-12 NOTE — Progress Notes (Signed)
Patient is A&O x4, up with SBA and cane. Decreased sensation to left lower leg, which is normal for him.  Uses call light appropriately. Blood sugars 109, 139, and 91. NSL in place.

## 2016-04-12 NOTE — Progress Notes (Signed)
[facility]                                                                                                                                                                                            Patient Demographics   Michael Montgomery, is a 55 y.o. male, DOB - January 17, 1961, BI:[id number]  Admit date - 04/11/2016   Admitting Physician Henreitta Leber, MD  Outpatient Primary MD for the patient is [name], MD   LOS - 1  Subjective:Patient complains of left lower extremity weakness. Otherwise feeling little better     Review of Systems:   CONSTITUTIONAL: No documented fever. No fatigue, weakness. No weight gain, no weight loss.  EYES: No blurry or double vision.  ENT: No tinnitus. No postnasal drip. No redness of the oropharynx.  RESPIRATORY: No cough, no wheeze, no hemoptysis. No dyspnea.  CARDIOVASCULAR: No chest pain. No orthopnea. No palpitations. No syncope.  GASTROINTESTINAL: No nausea, no vomiting or diarrhea. No abdominal pain. No melena or hematochezia.  GENITOURINARY: No dysuria or hematuria.  ENDOCRINE: No polyuria or nocturia. No heat or cold intolerance.  HEMATOLOGY: No anemia. No bruising. No bleeding.  INTEGUMENTARY: No rashes. No lesions.  MUSCULOSKELETAL: No arthritis. No swelling. No gout.  NEUROLOGIC:Left lower extremity weakness PSYCHIATRIC: No anxiety. No insomnia. No ADD.    Vitals:   Filed Vitals:   04/11/16 2031 04/12/16 0350 04/12/16 0500 04/12/16 0719  BP: 129/75 119/72  128/76  Pulse: 87 83  81  Temp: 98.8 F (37.1 C) 98.4 F (36.9 C)  99.1 F (37.3 C)  TempSrc: Oral Oral  Oral  Resp: 18 18  18   Height:      Weight:   75.388 kg (166 lb 3.2 oz)   SpO2: 99% 100%  99%    Wt Readings from Last 3 Encounters:  04/12/16 75.388 kg (166 lb 3.2 oz)  03/31/16 75.751 kg (167 lb)  03/17/16 75.751 kg (167 lb)     Intake/Output Summary (Last 24 hours) at 04/12/16 1317 Last data filed at  04/12/16 1251  Gross per 24 hour  Intake      0 ml  Output      3 ml  Net     -3 ml    Physical Exam:   GENERAL: Pleasant-appearing in no apparent distress.  HEAD, EYES, EARS, NOSE AND THROAT: Atraumatic, normocephalic. Extraocular muscles are intact. Pupils equal and reactive to light. Sclerae anicteric. No conjunctival injection. No oro-pharyngeal erythema.  NECK: Supple. There is no jugular venous distention. No bruits, no lymphadenopathy, no thyromegaly.  HEART: Regular rate and rhythm,.  No murmurs, no rubs, no clicks.  LUNGS: Clear to auscultation bilaterally. No rales or rhonchi. No wheezes.  ABDOMEN: Soft, flat, nontender, nondistended. Has good bowel sounds. No hepatosplenomegaly appreciated.  EXTREMITIES: No evidence of any cyanosis, clubbing, or peripheral edema.  +2 pedal and radial pulses bilaterally.  NEUROLOGIC: The patient is alert, awake, and oriented x3  Left lower extremity strength is 4 out of 5 wrist of the extremity is normal SKIN: Moist and warm with no rashes appreciated.  Psych: Not anxious, depressed LN: No inguinal LN enlargement    Antibiotics   Anti-infectives    None      Medications   Scheduled Meds: . amLODipine  10 mg Oral Daily  . atorvastatin  40 mg Oral Daily  . enoxaparin (LOVENOX) injection  40 mg Subcutaneous Q24H  . gabapentin  300 mg Oral TID  . Immune Globulin 10%  500 mg/kg Intravenous Q24H  . insulin aspart  0-5 Units Subcutaneous QHS  . insulin aspart  0-9 Units Subcutaneous TID WC  . lisinopril  10 mg Oral Daily  . metFORMIN  500 mg Oral BID WC  . sodium chloride flush  3 mL Intravenous Q12H   Continuous Infusions:  PRN Meds:.sodium chloride, acetaminophen **OR** acetaminophen, albuterol, bisacodyl, ondansetron **OR** ondansetron (ZOFRAN) IV, polyethylene glycol, sodium chloride flush   Data Review:   Micro Results No results found for this or any previous visit (from the past 240 hour(s)).  Radiology Reports Dg Fluoro  Guided Loc Of Needle/cath Tip For Spinal Inject Lt  04/11/2016  CLINICAL DATA:  Presumptive diagnosis of chronic inflammatory demyelinating polyneuropathy, spinal fluid evaluation needed. EXAM: DIAGNOSTIC LUMBAR PUNCTURE UNDER FLUOROSCOPIC GUIDANCE FLUOROSCOPY TIME:  Fluoroscopy Time (in minutes and seconds): Number of Acquired Images:  1 PROCEDURE: Informed consent was obtained from the patient prior to the procedure, including potential complications of headache, allergy, and pain. With the patient prone, the lower back was prepped with Betadine. 1% Lidocaine was used for local anesthesia. Lumbar puncture was performed at the L3 level initially using a 20 gauge needle. No fluid was obtained at this level and the level below, L4, was then anesthetized with lidocaine. A 20 gauge spinal needle was placed into the lumbar subarachnoid space with return of clear, colorless CSF. 5 ml of CSF were obtained for laboratory studies. The patient tolerated the procedure well and there were no apparent complications. IMPRESSION: Successful lumbar puncture for acquisition of spinal fluid for laboratory analysis as per Dr. Manuella Ghazi. Electronically Signed   By: David  Martinique M.D.   On: 04/11/2016 10:24     CBC  Recent Labs Lab 04/11/16 0750  WBC 5.4  HGB 14.5  HCT 43.4  PLT 211  MCV 88.0  MCH 29.4  MCHC 33.5  RDW 14.1    Chemistries   Recent Labs Lab 04/11/16 0750 04/11/16 1824  NA  --  140  K  --  3.7  CL  --  104  CO2  --  28  GLUCOSE 151* 151*  BUN  --  15  CREATININE  --  0.63  CALCIUM  --  9.2  AST  --  19  ALT  --  17  ALKPHOS  --  94  BILITOT  --  0.5   ------------------------------------------------------------------------------------------------------------------ estimated creatinine clearance is 110.3 mL/min (by C-G formula based on Cr of 0.63). ------------------------------------------------------------------------------------------------------------------  Recent Labs   04/11/16 1824  HGBA1C 5.8   ------------------------------------------------------------------------------------------------------------------ No results for input(s): CHOL, HDL, LDLCALC, TRIG, CHOLHDL, LDLDIRECT in  the last 72 hours. ------------------------------------------------------------------------------------------------------------------ No results for input(s): TSH, T4TOTAL, T3FREE, THYROIDAB in the last 72 hours.  Invalid input(s): FREET3 ------------------------------------------------------------------------------------------------------------------ No results for input(s): VITAMINB12, FOLATE, FERRITIN, TIBC, IRON, RETICCTPCT in the last 72 hours.  Coagulation profile  Recent Labs Lab 04/11/16 0750  INR 0.93    No results for input(s): DDIMER in the last 72 hours.  Cardiac Enzymes No results for input(s): CKMB, TROPONINI, MYOGLOBIN in the last 168 hours.  Invalid input(s): CK ------------------------------------------------------------------------------------------------------------------ Invalid input(s): POCBNP    Assessment & Plan   * CIDP Continue IVIG 500mg /kg x 3 more days Neurology consult pending Check B12 level and vitamin D level PT consult  * DM2 Metformin SSI HbA1c is normal   * HTN bp is stable Lisinopril and amlodipine  * DVT prophylaxis Lovenox     Code Status Orders        Start     Ordered   04/11/16 1810  Full code   Continuous     04/11/16 1810    Code Status History    Date Active Date Inactive Code Status Order ID Comments User Context   This patient has a current code status but no historical code status.           Consults  neurology  DVT Prophylaxis  Lovenox   Lab Results  Component Value Date   PLT 211 04/11/2016     Time Spent in minutes  100min  Greater than 50% of time spent in care coordination and counseling patient regarding the condition and plan of care.   Dustin Flock M.D on  04/12/2016 at 1:17 PM  Between 7am to 6pm - Pager - [phone]  After 6pm go to [email or web address] - password [email or web address] [facility]   Office  [phone]

## 2016-04-12 NOTE — Consult Note (Addendum)
Reason for Consult:CIDP Referring Physician: Posey Pronto  CC: LLE weakness  HPI: Michael Montgomery is an 55 y.o. male followed by Dr. Manuella Ghazi on an outpatient basis for LLE weakness.  Imaging unremarkable.  LP with elevated protein.  Diagnosed with CIDP by NCV/EMG.  Admitted for IVIg therapy.    Past Medical History  Diagnosis Date  . Chickenpox   . Gastric ulcer   . High blood pressure   . Diabetes mellitus without complication ([place])   . Heart murmur     When he was younger  . Hypercholesteremia   . Knee injury     left  . Motion sickness     boats  . Cataract     bilateral removed   . 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 Cataract extraction w/phaco Right 02/01/2016    Procedure: CATARACT EXTRACTION PHACO AND INTRAOCULAR LENS PLACEMENT ([place]) right;  Surgeon: Ronnell Freshwater, MD;  Location: [place];  Service: Ophthalmology;  Laterality: Right;  DIABETIC - oral meds  . Cataract extraction w/phaco Left 03/07/2016    Procedure: CATARACT EXTRACTION PHACO AND INTRAOCULAR LENS PLACEMENT (IOC);  Surgeon: Ronnell Freshwater, MD;  Location: [place];  Service: Ophthalmology;  Laterality: Left;  DIABETIC - oral meds   . Eye surgery      Family History  Problem Relation Age of Onset  . Alcoholism Father   . Hyperlipidemia Father   . Hypertension Father   . Throat cancer Father     smoked but had stopped 20 yeras before dx  . Diabetes Mother   . Kidney disease Neg Hx   . Prostate cancer Neg Hx   . Colon cancer Neg Hx   . Colon polyps Neg Hx   . Esophageal cancer Neg Hx   . Rectal cancer Neg Hx   . Stomach cancer Neg Hx     Social History:  reports that he has been smoking Cigars.  He has quit using smokeless tobacco. He reports that he drinks alcohol. He reports that he does not use illicit drugs.  No Known Allergies  Medications:  I have reviewed the patient's current  medications. Prior to Admission:  Prescriptions prior to admission  Medication Sig Dispense Refill Last Dose  . amLODipine (NORVASC) 10 MG tablet Take 1 tablet (10 mg total) by mouth daily. 90 tablet 1 04/11/2016 at Unknown time  . atorvastatin (LIPITOR) 40 MG tablet Take 1 tablet (40 mg total) by mouth daily. 90 tablet 3 04/11/2016 at Unknown time  . gabapentin (NEURONTIN) 300 MG capsule Take by mouth 3 (three) times daily. Reported on 03/15/2016   04/11/2016 at Unknown time  . lisinopril (PRINIVIL,ZESTRIL) 5 MG tablet Take 0.5 tablets (2.5 mg total) by mouth daily. (Patient taking differently: Take 10 mg by mouth daily. ) 45 tablet 3 04/11/2016 at Unknown time  . metFORMIN (GLUCOPHAGE) 500 MG tablet Please take 1000 mg (2 tablets) in the morning and 500 mg (1 tablet) at night by mouth 180 tablet 3 04/11/2016 at Unknown time  . Multiple Vitamin (MULTIVITAMIN) capsule Take 1 capsule by mouth daily.   04/11/2016 at Unknown time  . nystatin-triamcinolone ointment (MYCOLOG) Apply 1 application topically 2 (two) times daily. 30 g 0 04/11/2016 at Unknown time  . terbinafine (LAMISIL) 250 MG tablet Take 1 tablet (250 mg total) by mouth daily. 30 tablet 2 04/11/2016 at Unknown time  . [DISCONTINUED] predniSONE (  DELTASONE) 50 MG tablet Reported on 03/31/2016  0 04/11/2016 at Unknown time    ROS: History obtained from the patient  General ROS: negative for - chills, fatigue, fever, night sweats, weight gain or weight loss Psychological ROS: negative for - behavioral disorder, hallucinations, memory difficulties, mood swings or suicidal ideation Ophthalmic ROS: negative for - blurry vision, double vision, eye pain or loss of vision ENT ROS: negative for - epistaxis, nasal discharge, oral lesions, sore throat, tinnitus or vertigo Allergy and Immunology ROS: negative for - hives or itchy/watery eyes Hematological and Lymphatic ROS: negative for - bleeding problems, bruising or swollen lymph nodes Endocrine ROS:  negative for - galactorrhea, hair pattern changes, polydipsia/polyuria or temperature intolerance Respiratory ROS: negative for - cough, hemoptysis, shortness of breath or wheezing Cardiovascular ROS: negative for - chest pain, dyspnea on exertion, edema or irregular heartbeat Gastrointestinal ROS: negative for - abdominal pain, diarrhea, hematemesis, nausea/vomiting or stool incontinence Genito-Urinary ROS: negative for - dysuria, hematuria, incontinence or urinary frequency/urgency Musculoskeletal ROS: negative for - joint swelling or muscular weakness Neurological ROS: as noted in HPI Dermatological ROS: negative for rash and skin lesion changes  Physical Examination: Blood pressure 128/74, pulse 92, temperature 98 F (36.7 C), temperature source Oral, resp. rate 18, height 5\' 10"  (1.778 m), weight 75.388 kg (166 lb 3.2 oz), SpO2 99 %.  HEENT-  Normocephalic, no lesions, without obvious abnormality.  Normal external eye and conjunctiva.  Normal TM's bilaterally.  Normal auditory canals and external ears. Normal external nose, mucus membranes and septum.  Normal pharynx. Cardiovascular- S1, S2 normal, pulses palpable throughout   Lungs- chest clear, no wheezing, rales, normal symmetric air entry Abdomen- soft, non-tender; bowel sounds normal; no masses,  no organomegaly Extremities- no edema Lymph-no adenopathy palpable Musculoskeletal-no joint tenderness, deformity or swelling Skin-warm and dry, no hyperpigmentation, vitiligo, or suspicious lesions  Neurological Examination Mental Status: Alert, oriented, thought content appropriate.  Speech fluent without evidence of aphasia.  Able to follow 3 step commands without difficulty. Cranial Nerves: II: Discs flat bilaterally; Visual fields grossly normal, pupils equal, round, reactive to light and accommodation III,IV, VI: ptosis not present, extra-ocular motions intact bilaterally V,VII: smile symmetric, facial light touch sensation normal  bilaterally VIII: hearing normal bilaterally IX,X: gag reflex present XI: bilateral shoulder shrug XII: midline tongue extension Motor: Right : Upper extremity   5/5    Left:     Upper extremity   Give-way weakness with no drift  Lower extremity   5/5     Lower extremity   3-/5 hip flexors and quads, 5/5 distally Tone and bulk:normal tone throughout; no atrophy noted Sensory: Pinprick and light touch intact throughout, bilaterally Deep Tendon Reflexes: 2+ in the upper extremities, 1+ in the RLE and absent in the LLE Plantars: Right: downgoing   Left: downgoing Cerebellar: Normal finger-to-nose testing bilaterally Gait: not tested due to safety concerns   Laboratory Studies:   Basic Metabolic Panel:  Recent Labs Lab 04/11/16 0750 04/11/16 1824  NA  --  140  K  --  3.7  CL  --  104  CO2  --  28  GLUCOSE 151* 151*  BUN  --  15  CREATININE  --  0.63  CALCIUM  --  9.2    Liver Function Tests:  Recent Labs Lab 04/11/16 1824  AST 19  ALT 17  ALKPHOS 94  BILITOT 0.5  PROT 7.1  ALBUMIN 4.2   No results for input(s): LIPASE, AMYLASE in the last 168  hours. No results for input(s): AMMONIA in the last 168 hours.  CBC:  Recent Labs Lab 04/11/16 0750  WBC 5.4  HGB 14.5  HCT 43.4  MCV 88.0  PLT 211    Cardiac Enzymes: No results for input(s): CKTOTAL, CKMB, CKMBINDEX, TROPONINI in the last 168 hours.  BNP: Invalid input(s): POCBNP  CBG:  Recent Labs Lab 04/11/16 2058 04/12/16 0725 04/12/16 1121  GLUCAP 134* 109* 139*    Microbiology: No results found for this or any previous visit.  Coagulation Studies:  Recent Labs  04/11/16 0750  LABPROT 12.7  INR 0.93    Urinalysis: No results for input(s): COLORURINE, LABSPEC, PHURINE, GLUCOSEU, HGBUR, BILIRUBINUR, KETONESUR, PROTEINUR, UROBILINOGEN, NITRITE, LEUKOCYTESUR in the last 168 hours.  Invalid input(s): APPERANCEUR  Lipid Panel:     Component Value Date/Time   CHOL 197 01/19/2016 1131    TRIG 110.0 01/19/2016 1131   HDL 45.00 01/19/2016 1131   CHOLHDL 4 01/19/2016 1131   VLDL 22.0 01/19/2016 1131   LDLCALC 130* 01/19/2016 1131    HgbA1C:  Lab Results  Component Value Date   HGBA1C 5.8 04/11/2016    Urine Drug Screen:  No results found for: LABOPIA, COCAINSCRNUR, LABBENZ, AMPHETMU, THCU, LABBARB  Alcohol Level: No results for input(s): ETH in the last 168 hours.   Imaging: Dg Fluoro Guided Loc Of Needle/cath Tip For Spinal Inject Lt  04/11/2016  CLINICAL DATA:  Presumptive diagnosis of chronic inflammatory demyelinating polyneuropathy, spinal fluid evaluation needed. EXAM: DIAGNOSTIC LUMBAR PUNCTURE UNDER FLUOROSCOPIC GUIDANCE FLUOROSCOPY TIME:  Fluoroscopy Time (in minutes and seconds): Number of Acquired Images:  1 PROCEDURE: Informed consent was obtained from the patient prior to the procedure, including potential complications of headache, allergy, and pain. With the patient prone, the lower back was prepped with Betadine. 1% Lidocaine was used for local anesthesia. Lumbar puncture was performed at the L3 level initially using a 20 gauge needle. No fluid was obtained at this level and the level below, L4, was then anesthetized with lidocaine. A 20 gauge spinal needle was placed into the lumbar subarachnoid space with return of clear, colorless CSF. 5 ml of CSF were obtained for laboratory studies. The patient tolerated the procedure well and there were no apparent complications. IMPRESSION: Successful lumbar puncture for acquisition of spinal fluid for laboratory analysis as per Dr. Manuella Ghazi. Electronically Signed   By: David  Martinique M.D.   On: 04/11/2016 10:24     Assessment/Plan: 55 year old male with CIDP admitted as an elective admission for IVIg treatment.  Patient to receive 4 treatments of 500mg /kg.  Today is day #2 of treatment.  Recommendations: 1.  Agree with 4 day total treatment 2. BMET in AM.  Alexis Goodell, MD Neurology [phone] 04/12/2016, 2:22 PM

## 2016-04-13 ENCOUNTER — Encounter: Payer: BLUE CROSS/BLUE SHIELD | Admitting: Physical Therapy

## 2016-04-13 LAB — BASIC METABOLIC PANEL
Anion gap: 5 (ref 5–15)
BUN: 12 mg/dL (ref 6–20)
CO2: 29 mmol/L (ref 22–32)
Calcium: 8.9 mg/dL (ref 8.9–10.3)
Chloride: 101 mmol/L (ref 101–111)
Creatinine, Ser: 0.75 mg/dL (ref 0.61–1.24)
GFR calc Af Amer: >60 mL/min (ref >60)
GFR calc non Af Amer: >60 mL/min (ref >60)
Glucose, Bld: 135 mg/dL — ABNORMAL HIGH (ref 65–99)
Potassium: 3.9 mmol/L (ref 3.5–5.1)
Sodium: 135 mmol/L (ref 135–145)

## 2016-04-13 LAB — GLUCOSE, CAPILLARY
Glucose-Capillary: 142 mg/dL — ABNORMAL HIGH (ref 65–99)
Glucose-Capillary: 122 mg/dL — ABNORMAL HIGH (ref 65–99)
Glucose-Capillary: 101 mg/dL — ABNORMAL HIGH (ref 65–99)
Glucose-Capillary: 124 mg/dL — ABNORMAL HIGH (ref 65–99)

## 2016-04-13 LAB — CALCITRIOL (1,25 DI-OH VIT D): Vit D, 1,25-Dihydroxy: 24.9 pg/mL (ref 19.9–79.3)

## 2016-04-13 MED ORDER — HYDROCODONE-ACETAMINOPHEN 5-325 MG PO TABS: 1.0000 | ORAL_TABLET | ORAL | Status: DC | PRN

## 2016-04-13 MED ORDER — IBUPROFEN 400 MG PO TABS
400.0000 mg | ORAL_TABLET | Freq: Four times a day (QID) | ORAL | Status: DC | PRN
  Administered 2016-04-13 (×2): 400 mg via ORAL
  Filled 2016-04-13 (×2): qty 1

## 2016-04-13 NOTE — Progress Notes (Signed)
[place] [facility]                                                                                                                                                                                            Patient Demographics   Michael Montgomery, is a 55 y.o. male, DOB - 09-16-1961, [id number]  Admit date - 04/11/2016   Admitting Physician Henreitta Leber, MD  Outpatient Primary MD for the patient is [name], MD   LOS - 2  SubjectivePatient has a headache this morning. States she has some improvement in the left leg weakness    Review of Systems:   CONSTITUTIONAL: No documented fever. No fatigue, weakness. No weight gain, no weight loss.  EYES: No blurry or double vision.  ENT: No tinnitus. No postnasal drip. No redness of the oropharynx.  RESPIRATORY: No cough, no wheeze, no hemoptysis. No dyspnea.  CARDIOVASCULAR: No chest pain. No orthopnea. No palpitations. No syncope.  GASTROINTESTINAL: No nausea, no vomiting or diarrhea. No abdominal pain. No melena or hematochezia.  GENITOURINARY: No dysuria or hematuria.  ENDOCRINE: No polyuria or nocturia. No heat or cold intolerance.  HEMATOLOGY: No anemia. No bruising. No bleeding.  INTEGUMENTARY: No rashes. No lesions.  MUSCULOSKELETAL: No arthritis. No swelling. No gout.  NEUROLOGIC:Left lower extremity weakness PSYCHIATRIC: No anxiety. No insomnia. No ADD.    Vitals:   Filed Vitals:   04/12/16 1920 04/13/16 0500 04/13/16 0717 04/13/16 0920  BP: 150/69 117/59 131/75 137/65  Pulse: 96 108 109   Temp: 98.7 F (37.1 C) 99.3 F (37.4 C) 99.1 F (37.3 C)   TempSrc: Oral Oral Oral   Resp: 20 18 18    Height:      Weight:  74.571 kg (164 lb 6.4 oz)    SpO2: 98% 98% 98%     Wt Readings from Last 3 Encounters:  04/13/16 74.571 kg (164 lb 6.4 oz)  03/31/16 75.751 kg (167 lb)  03/17/16 75.751 kg (167 lb)     Intake/Output Summary (Last 24 hours) at 04/13/16 1153 Last data  filed at 04/12/16 2345  Gross per 24 hour  Intake      0 ml  Output      0 ml  Net      0 ml    Physical Exam:   GENERAL: Pleasant-appearing in no apparent distress.  HEAD, EYES, EARS, NOSE AND THROAT: Atraumatic, normocephalic. Extraocular muscles are intact. Pupils equal and reactive to light. Sclerae anicteric. No conjunctival injection. No oro-pharyngeal erythema.  NECK: Supple. There is no jugular venous distention. No bruits, no lymphadenopathy, no thyromegaly.  HEART: Regular rate and rhythm,. No murmurs, no rubs, no clicks.  LUNGS: Clear to auscultation bilaterally. No rales or rhonchi. No wheezes.  ABDOMEN: Soft, flat, nontender, nondistended. Has good bowel sounds. No hepatosplenomegaly appreciated.  EXTREMITIES: No evidence of any cyanosis, clubbing, or peripheral edema.  +2 pedal and radial pulses bilaterally.  NEUROLOGIC: The patient is alert, awake, and oriented x3  Left lower extremity strength is 4 out of 5 wrist of the extremity is normal SKIN: Moist and warm with no rashes appreciated.  Psych: Not anxious, depressed LN: No inguinal LN enlargement    Antibiotics   Anti-infectives    None      Medications   Scheduled Meds: . amLODipine  10 mg Oral Daily  . atorvastatin  40 mg Oral Daily  . enoxaparin (LOVENOX) injection  40 mg Subcutaneous Q24H  . gabapentin  300 mg Oral TID  . Immune Globulin 10%  500 mg/kg Intravenous Q24H  . insulin aspart  0-5 Units Subcutaneous QHS  . insulin aspart  0-9 Units Subcutaneous TID WC  . lisinopril  10 mg Oral Daily  . metFORMIN  500 mg Oral BID WC  . sodium chloride flush  3 mL Intravenous Q12H   Continuous Infusions:  PRN Meds:.sodium chloride, acetaminophen **OR** acetaminophen, albuterol, bisacodyl, HYDROcodone-acetaminophen, ibuprofen, ondansetron **OR** ondansetron (ZOFRAN) IV, polyethylene glycol, sodium chloride flush   Data Review:   Micro Results No results found for this or any previous visit (from the  past 240 hour(s)).  Radiology Reports Dg Fluoro Guided Loc Of Needle/cath Tip For Spinal Inject Lt  04/11/2016  CLINICAL DATA:  Presumptive diagnosis of chronic inflammatory demyelinating polyneuropathy, spinal fluid evaluation needed. EXAM: DIAGNOSTIC LUMBAR PUNCTURE UNDER FLUOROSCOPIC GUIDANCE FLUOROSCOPY TIME:  Fluoroscopy Time (in minutes and seconds): Number of Acquired Images:  1 PROCEDURE: Informed consent was obtained from the patient prior to the procedure, including potential complications of headache, allergy, and pain. With the patient prone, the lower back was prepped with Betadine. 1% Lidocaine was used for local anesthesia. Lumbar puncture was performed at the L3 level initially using a 20 gauge needle. No fluid was obtained at this level and the level below, L4, was then anesthetized with lidocaine. A 20 gauge spinal needle was placed into the lumbar subarachnoid space with return of clear, colorless CSF. 5 ml of CSF were obtained for laboratory studies. The patient tolerated the procedure well and there were no apparent complications. IMPRESSION: Successful lumbar puncture for acquisition of spinal fluid for laboratory analysis as per Dr. Manuella Ghazi. Electronically Signed   By: David  Martinique M.D.   On: 04/11/2016 10:24     CBC  Recent Labs Lab 04/11/16 0750  WBC 5.4  HGB 14.5  HCT 43.4  PLT 211  MCV 88.0  MCH 29.4  MCHC 33.5  RDW 14.1    Chemistries   Recent Labs Lab 04/11/16 0750 04/11/16 1824 04/13/16 0427  NA  --  140 135  K  --  3.7 3.9  CL  --  104 101  CO2  --  28 29  GLUCOSE 151* 151* 135*  BUN  --  15 12  CREATININE  --  0.63 0.75  CALCIUM  --  9.2 8.9  AST  --  19  --   ALT  --  17  --   ALKPHOS  --  94  --   BILITOT  --  0.5  --    ------------------------------------------------------------------------------------------------------------------ estimated creatinine clearance is 110.3 mL/min (by C-G formula based  on Cr of  0.75). ------------------------------------------------------------------------------------------------------------------  Recent Labs  04/11/16 1824  HGBA1C 5.8   ------------------------------------------------------------------------------------------------------------------ No results for input(s): CHOL, HDL, LDLCALC, TRIG, CHOLHDL, LDLDIRECT in the last 72 hours. ------------------------------------------------------------------------------------------------------------------ No results for input(s): TSH, T4TOTAL, T3FREE, THYROIDAB in the last 72 hours.  Invalid input(s): FREET3 ------------------------------------------------------------------------------------------------------------------  Recent Labs  04/12/16 1105  VITAMINB12 426    Coagulation profile  Recent Labs Lab 04/11/16 0750  INR 0.93    No results for input(s): DDIMER in the last 72 hours.  Cardiac Enzymes No results for input(s): CKMB, TROPONINI, MYOGLOBIN in the last 168 hours.  Invalid input(s): CK ------------------------------------------------------------------------------------------------------------------ Invalid input(s): POCBNP    Assessment & Plan   * CIDP Continue IVIG 500mg /kg 3/4 day B12 level normal PT consult  * DM2 Metformin SSI HbA1c is normal   * HTN bp is stable Lisinopril and amlodipine   *Headache will try some Percocet  * DVT prophylaxis Lovenox     Code Status Orders        Start     Ordered   04/11/16 1810  Full code   Continuous     04/11/16 1810    Code Status History    Date Active Date Inactive Code Status Order ID Comments User Context   This patient has a current code status but no historical code status.           Consults  neurology  DVT Prophylaxis  Lovenox   Lab Results  Component Value Date   PLT 211 04/11/2016     Time Spent in minutes  27min  Greater than 50% of time spent in care coordination and counseling patient  regarding the condition and plan of care.   Dustin Flock M.D on 04/13/2016 at 11:53 AM  Between 7am to 6pm - Pager - [phone]  After 6pm go to [email or web address] - password [email or web address] [facility]   Office  [phone]

## 2016-04-13 NOTE — Progress Notes (Signed)
Patient IV removed; EMS picked patient up to transfer to [facility]

## 2016-04-13 NOTE — Progress Notes (Addendum)
Subjective: Patient reports headache.    Objective: Current vital signs: BP 137/65 mmHg  Pulse 109  Temp(Src) 99.1 F (37.3 C) (Oral)  Resp 18  Ht 5\' 10"  (1.778 m)  Wt 74.571 kg (164 lb 6.4 oz)  BMI 23.59 kg/m2  SpO2 98% Vital signs in last 24 hours: Temp:  [98 F (36.7 C)-99.3 F (37.4 C)] 99.1 F (37.3 C) (06/21 0717) Pulse Rate:  [92-109] 109 (06/21 0717) Resp:  [18-20] 18 (06/21 0717) BP: (117-150)/(59-75) 137/65 mmHg (06/21 0920) SpO2:  [98 %-99 %] 98 % (06/21 0717) Weight:  [74.571 kg (164 lb 6.4 oz)] 74.571 kg (164 lb 6.4 oz) (06/21 0500)  Intake/Output from previous day:   Intake/Output this shift:   Nutritional status: Diet Carb Modified Fluid consistency:: Thin; Room service appropriate?: Yes  Neurologic Exam: Mental Status: Alert, oriented, thought content appropriate. Speech fluent without evidence of aphasia. Able to follow 3 step commands without difficulty. Cranial Nerves: II: Discs flat bilaterally; Visual fields grossly normal, pupils equal, round, reactive to light and accommodation III,IV, VI: ptosis not present, extra-ocular motions intact bilaterally V,VII: smile symmetric, facial light touch sensation normal bilaterally VIII: hearing normal bilaterally IX,X: gag reflex present XI: bilateral shoulder shrug XII: midline tongue extension Motor: Right :Upper extremity 5/5Left: Upper extremity Give-way weakness with no drift Lower extremity 5/5Lower extremity 3-/5 hip flexors and quads, 5/5 distally  Lab Results: Basic Metabolic Panel:  Recent Labs Lab 04/11/16 0750 04/11/16 1824 04/13/16 0427  NA  --  140 135  K  --  3.7 3.9  CL  --  104 101  CO2  --  28 29  GLUCOSE 151* 151* 135*  BUN  --  15 12  CREATININE  --  0.63 0.75  CALCIUM  --  9.2 8.9    Liver Function Tests:  Recent Labs Lab 04/11/16 1824  AST 19  ALT 17   ALKPHOS 94  BILITOT 0.5  PROT 7.1  ALBUMIN 4.2   No results for input(s): LIPASE, AMYLASE in the last 168 hours. No results for input(s): AMMONIA in the last 168 hours.  CBC:  Recent Labs Lab 04/11/16 0750  WBC 5.4  HGB 14.5  HCT 43.4  MCV 88.0  PLT 211    Cardiac Enzymes: No results for input(s): CKTOTAL, CKMB, CKMBINDEX, TROPONINI in the last 168 hours.  Lipid Panel: No results for input(s): CHOL, TRIG, HDL, CHOLHDL, VLDL, LDLCALC in the last 168 hours.  CBG:  Recent Labs Lab 04/12/16 1121 04/12/16 1612 04/12/16 2101 04/13/16 0721 04/13/16 1130  GLUCAP 139* 91 116* 142* 122*    Microbiology: No results found for this or any previous visit.  Coagulation Studies:  Recent Labs  04/11/16 0750  LABPROT 12.7  INR 0.93    Imaging: No results found.  Medications:  I have reviewed the patient's current medications. Scheduled: . amLODipine  10 mg Oral Daily  . atorvastatin  40 mg Oral Daily  . enoxaparin (LOVENOX) injection  40 mg Subcutaneous Q24H  . gabapentin  300 mg Oral TID  . Immune Globulin 10%  500 mg/kg Intravenous Q24H  . insulin aspart  0-5 Units Subcutaneous QHS  . insulin aspart  0-9 Units Subcutaneous TID WC  . lisinopril  10 mg Oral Daily  . metFORMIN  500 mg Oral BID WC  . sodium chloride flush  3 mL Intravenous Q12H    Assessment/Plan: Today is day#3 of 4 for IVIg.  Lab work unremarkable.  Complains of headache.  No change in  neurological examination.  Recommendations:  1. Patient to complete four day course with follow up with Dr. Manuella Ghazi after discharge.   2. Analgesia for headache    LOS: 2 days   Alexis Goodell, MD Neurology [phone] 04/13/2016  11:42 AM

## 2016-04-13 NOTE — Progress Notes (Signed)
PT Cancellation Note  Patient Details Name: Michael Montgomery MRN: [id number] DOB: 06-14-1961   Cancelled Treatment:    Reason Eval/Treat Not Completed: Pain limiting ability to participate. Pt with c/o 8/10 throbbing headache which worsens when out of supine and pt is declining PT at this time. Pt reports RN aware and has given pain medication. Pt agreeable to re-attempt at later time/date.   Lani Havlik, SPT 04/13/2016, 10:53 AM

## 2016-04-13 NOTE — Progress Notes (Signed)
Physical Therapy Treatment Patient Details Name: Michael Montgomery MRN: [id number] DOB: [birth year] Today's Date: 04/13/2016    History of Present Illness Pt is a pleasant 55 yr old male presenting with progressive LE weakness. Admitted for Chronic Inflammatory Demyelinating Polyneuropathy to undergo IVIG treatment. PMH significant for HTN, DM, and neuropathy.    PT Comments    Pt with 7/10 throbbing headache and nausea today, thus declining any OOB activity. He was able to participate in supine TherEx as detailed below, which he tolerated well. Endorses no change in LE sensation from eval. Prior to d/c, pt would benefit from stair training and higher level supine/standing TherEx for LLE strengthening.     Follow Up Recommendations  Outpatient PT     Equipment Recommendations  None (has single point cane)    Recommendations for Other Services       Precautions / Restrictions Restrictions Weight Bearing Restrictions: No    Mobility  Bed Mobility  General bed mobility comments: Did not perform; pt declining OOB activity this session d/t headache/nausea  Transfers  General transfer comment: Did not perform; pt declining OOB activity this session d/t headache/nausea  Ambulation/Gait  General Gait Details: Did not perform; pt declining OOB activity this session d/t headache/nausea   Stairs General stairs comment: Did not perform; pt declining OOB activity this session d/t headache/nausea. Will require stair training prior to d/c.     Wheelchair Mobility    Modified Rankin (Stroke Patients Only)       Balance  Standing balance comment: Did not perform; pt declining OOB activity this session d/t headache/nausea     Cognition Arousal/Alertness: Awake/alert Behavior During Therapy: WFL for tasks assessed/performed Overall Cognitive Status: Within Functional Limits for tasks assessed     Exercises General Exercises - Lower Extremity Ankle Circles/Pumps:  AROM;Strengthening;Both;10 reps;Supine Quad Sets: AROM;Strengthening;Both;10 reps;Supine Short Arc Quad: AROM;AAROM;Strengthening;Both;10 reps;Supine (AROM RLE; AAROM LLE) Heel Slides: AROM;AAROM;Strengthening;Both;10 reps;Supine (AROM RLE; AAROM LLE) Hip ABduction/ADduction: AROM;Strengthening;Both;10 reps;Supine Straight Leg Raises: AROM;AAROM;Strengthening;Both;10 reps;Supine (AROM RLE; AAROM LLE)    General Comments General comments (skin integrity, edema, etc.): Pt resting supine upon room entry, with c/o persistent 7/10 throbbing headache and nausea. He declines OOB activity as "anytime I sit up I feel like I am going to throw up", but is agreeable to bed level exercises.      Pertinent Vitals/Pain Pain Assessment: 0-10 Pain Score: 7  Pain Location: headache Pain Descriptors / Indicators: Throbbing Pain Intervention(s): Limited activity within patient's tolerance;Monitored during session           PT Goals (current goals can now be found in the care plan section) Acute Rehab PT Goals Patient Stated Goal: To go home PT Goal Formulation: With patient Time For Goal Achievement: 04/26/16 Potential to Achieve Goals: Good Progress towards PT goals: PT to reassess next treatment (pt declining OOB activity; thus unable to assess goals)    Frequency  7X/week    PT Plan Current plan remains appropriate       End of Session   Activity Tolerance: Patient limited by pain Patient left: in bed;with call bell/phone within reach;with bed alarm set     Time: [id number] PT Time Calculation (min) (ACUTE ONLY): 12 min  Charges:                       G Codes:      Derl Abalos, SPT 04/13/2016, 2:24 PM

## 2016-04-14 ENCOUNTER — Telehealth: Payer: Self-pay | Admitting: Family Medicine

## 2016-04-14 LAB — IGG CSF INDEX
Albumin CSF-mCnc: 67 mg/dL — ABNORMAL HIGH (ref 11–48)
Albumin: 4.5 g/dL (ref 3.5–5.5)
CSF IgG Index: 0.4 (ref 0.0–0.7)
IgG (Immunoglobin G), Serum: 1068 mg/dL (ref 700–1600)
IgG, CSF: 7.1 mg/dL (ref 0.0–8.6)
IgG/Alb Ratio, CSF: 0.11 (ref 0.00–0.25)

## 2016-04-14 LAB — GLUCOSE, CAPILLARY
Glucose-Capillary: 146 mg/dL — ABNORMAL HIGH (ref 65–99)
Glucose-Capillary: 139 mg/dL — ABNORMAL HIGH (ref 65–99)

## 2016-04-14 LAB — ANGIOTENSIN CONVERTING ENZYME, CSF: Angio Convert Enzyme: 0.8 U/L (ref 0.0–2.5)

## 2016-04-14 LAB — VDRL, CSF: VDRL Quant, CSF: NONREACTIVE

## 2016-04-14 LAB — OLIGOCLONAL BANDS, CSF + SERM

## 2016-04-14 NOTE — Progress Notes (Signed)
Subjective: No new complaints.  Headache improved.    Objective: Current vital signs: BP 132/71 mmHg  Pulse 90  Temp(Src) 98.8 F (37.1 C) (Oral)  Resp 18  Ht 5\' 10"  (1.778 m)  Wt 75.206 kg (165 lb 12.8 oz)  BMI 23.79 kg/m2  SpO2 99% Vital signs in last 24 hours: Temp:  [97.9 F (36.6 C)-98.8 F (37.1 C)] 98.8 F (37.1 C) (06/22 0733) Pulse Rate:  [86-98] 90 (06/22 0733) Resp:  [16-18] 18 (06/22 0733) BP: (103-132)/(52-71) 132/71 mmHg (06/22 0733) SpO2:  [98 %-99 %] 99 % (06/22 0733) Weight:  [75.206 kg (165 lb 12.8 oz)] 75.206 kg (165 lb 12.8 oz) (06/22 0416)  Intake/Output from previous day: 06/21 0701 - 06/22 0700 In: 1080 [P.O.:1080] Out: -  Intake/Output this shift:   Nutritional status: Diet Carb Modified Fluid consistency:: Thin; Room service appropriate?: Yes  Neurologic Exam: Mental Status: Alert, oriented, thought content appropriate. Speech fluent without evidence of aphasia. Able to follow 3 step commands without difficulty. Cranial Nerves: II: Discs flat bilaterally; Visual fields grossly normal, pupils equal, round, reactive to light and accommodation III,IV, VI: ptosis not present, extra-ocular motions intact bilaterally V,VII: smile symmetric, facial light touch sensation normal bilaterally VIII: hearing normal bilaterally IX,X: gag reflex present XI: bilateral shoulder shrug XII: midline tongue extension Motor: Right :Upper extremity 5/5Left: Upper extremity Give-way weakness with no drift Lower extremity 5/5Lower extremity 3-/5 hip flexors and quads, 5/5 distally  Lab Results: Basic Metabolic Panel:  Recent Labs Lab 04/11/16 0750 04/11/16 1824 04/13/16 0427  NA  --  140 135  K  --  3.7 3.9  CL  --  104 101  CO2  --  28 29  GLUCOSE 151* 151* 135*  BUN  --  15 12  CREATININE  --  0.63 0.75  CALCIUM  --  9.2 8.9    Liver  Function Tests:  Recent Labs Lab 04/11/16 1824  AST 19  ALT 17  ALKPHOS 94  BILITOT 0.5  PROT 7.1  ALBUMIN 4.2   No results for input(s): LIPASE, AMYLASE in the last 168 hours. No results for input(s): AMMONIA in the last 168 hours.  CBC:  Recent Labs Lab 04/11/16 0750  WBC 5.4  HGB 14.5  HCT 43.4  MCV 88.0  PLT 211    Cardiac Enzymes: No results for input(s): CKTOTAL, CKMB, CKMBINDEX, TROPONINI in the last 168 hours.  Lipid Panel: No results for input(s): CHOL, TRIG, HDL, CHOLHDL, VLDL, LDLCALC in the last 168 hours.  CBG:  Recent Labs Lab 04/13/16 1130 04/13/16 1652 04/13/16 2129 04/14/16 0749 04/14/16 1207  GLUCAP 122* 101* 124* 146* 139*    Microbiology: No results found for this or any previous visit.  Coagulation Studies: No results for input(s): LABPROT, INR in the last 72 hours.  Imaging: No results found.  Medications:  I have reviewed the patient's current medications. Scheduled: . amLODipine  10 mg Oral Daily  . atorvastatin  40 mg Oral Daily  . enoxaparin (LOVENOX) injection  40 mg Subcutaneous Q24H  . gabapentin  300 mg Oral TID  . Immune Globulin 10%  500 mg/kg Intravenous Q24H  . insulin aspart  0-5 Units Subcutaneous QHS  . insulin aspart  0-9 Units Subcutaneous TID WC  . lisinopril  10 mg Oral Daily  . metFORMIN  500 mg Oral BID WC  . sodium chloride flush  3 mL Intravenous Q12H    Assessment/Plan: Patient s/p 4 doses IVIg.  To follow up with  Dr. Manuella Ghazi as an outpatient.     LOS: 3 days   Alexis Goodell, MD Neurology [phone] 04/14/2016  1:19 PM

## 2016-04-14 NOTE — Discharge Summary (Signed)
Michael Montgomery, 55 y.o., DOB Nov 10, 1960, MRN [id number]. Admission date: 04/11/2016 Discharge Date 04/14/2016 Primary MD [name], MD Admitting Physician Henreitta Leber, MD  Admission Diagnosis  CIDP  Discharge Diagnosis   Active Problems:   CIDP (chronic inflammatory demyelinating polyneuropathy) [facility])  diabetes type 2 Central hypertension Headache       Hospital Course Patient started noticing his symptoms with worsening weakness and numbness in left lower extremity. Initially diabetic neuropathy was suspected. He was started on Neurontin. An MRI was checked which was normal. EMG's were done. On the day of admission patient had LP which showed elevated protein. Therefore he was referred by his primary neurologist for IVIG therapy. Patient was treated with IVIG for total of 5 doses. His leg weakness is improved. He will need outpatient physical therapy. Currently doing much better and is stable for discharge.           Consults  neurology  Significant Tests:  See full reports for all details      Dg Fluoro Guided Loc Of Needle/cath Tip For Spinal Inject Lt  04/11/2016  CLINICAL DATA:  Presumptive diagnosis of chronic inflammatory demyelinating polyneuropathy, spinal fluid evaluation needed. EXAM: DIAGNOSTIC LUMBAR PUNCTURE UNDER FLUOROSCOPIC GUIDANCE FLUOROSCOPY TIME:  Fluoroscopy Time (in minutes and seconds): Number of Acquired Images:  1 PROCEDURE: Informed consent was obtained from the patient prior to the procedure, including potential complications of headache, allergy, and pain. With the patient prone, the lower back was prepped with Betadine. 1% Lidocaine was used for local anesthesia. Lumbar puncture was performed at the L3 level initially using a 20 gauge needle. No fluid was obtained at this level and the level below, L4, was then anesthetized with lidocaine. A 20 gauge spinal needle was placed into the lumbar subarachnoid space with return of clear, colorless  CSF. 5 ml of CSF were obtained for laboratory studies. The patient tolerated the procedure well and there were no apparent complications. IMPRESSION: Successful lumbar puncture for acquisition of spinal fluid for laboratory analysis as per Dr. Manuella Ghazi. Electronically Signed   By: David  Martinique M.D.   On: 04/11/2016 10:24       Today   Subjective:   Michael Montgomery  feeling better weakness improved  Objective:   Blood pressure 132/71, pulse 90, temperature 98.8 F (37.1 C), temperature source Oral, resp. rate 18, height 5\' 10"  (1.778 m), weight 75.206 kg (165 lb 12.8 oz), SpO2 99 %.  .  Intake/Output Summary (Last 24 hours) at 04/14/16 1526 Last data filed at 04/14/16 0600  Gross per 24 hour  Intake    600 ml  Output      0 ml  Net    600 ml    Exam VITAL SIGNS: Blood pressure 132/71, pulse 90, temperature 98.8 F (37.1 C), temperature source Oral, resp. rate 18, height 5\' 10"  (1.778 m), weight 75.206 kg (165 lb 12.8 oz), SpO2 99 %.  GENERAL:  55 y.o.-year-old patient lying in the bed with no acute distress.  EYES: Pupils equal, round, reactive to light and accommodation. No scleral icterus. Extraocular muscles intact.  HEENT: Head atraumatic, normocephalic. Oropharynx and nasopharynx clear.  NECK:  Supple, no jugular venous distention. No thyroid enlargement, no tenderness.  LUNGS: Normal breath sounds bilaterally, no wheezing, rales,rhonchi or crepitation. No use of accessory muscles of respiration.  CARDIOVASCULAR: S1, S2 normal. No murmurs, rubs, or gallops.  ABDOMEN: Soft, nontender, nondistended. Bowel sounds present. No organomegaly or mass.  EXTREMITIES: No pedal edema, cyanosis, or  clubbing.  NEUROLOGIC: Cranial nerves II through XII are intact.Left leg strength 4 out of 5 5C: The patient is alert and oriented x 3.  SKIN: No obvious rash, lesion, or ulcer.   Data Review     CBC w Diff: Lab Results  Component Value Date   WBC 5.4 04/11/2016   HGB 14.5 04/11/2016    HCT 43.4 04/11/2016   PLT 211 04/11/2016   CMP: Lab Results  Component Value Date   NA 135 04/13/2016   K 3.9 04/13/2016   CL 101 04/13/2016   CO2 29 04/13/2016   BUN 12 04/13/2016   CREATININE 0.75 04/13/2016   PROT 7.1 04/11/2016   ALBUMIN 4.2 04/11/2016   BILITOT 0.5 04/11/2016   ALKPHOS 94 04/11/2016   AST 19 04/11/2016   ALT 17 04/11/2016  .  Micro Results No results found for this or any previous visit (from the past 240 hour(s)).      Code Status Orders        Start     Ordered   04/11/16 1810  Full code   Continuous     04/11/16 1810    Code Status History    Date Active Date Inactive Code Status Order ID Comments User Context   This patient has a current code status but no historical code status.          Follow-up Information    Follow up with [name], MD. Go on 04/21/2016.   Specialty:  Family Medicine   Why:  11:30 a.m   Contact information:   [address] [place] [address] [phone]       Follow up with Vladimir Crofts, MD. Go on 06/15/2016.   Specialty:  Neurology   Why:  Time: 10:00 a.m.   Contact information:   [facility] [place] [address] [phone]       Discharge Medications     Medication List    TAKE these medications        amLODipine 10 MG tablet  Commonly known as:  NORVASC  Take 1 tablet (10 mg total) by mouth daily.     atorvastatin 40 MG tablet  Commonly known as:  LIPITOR  Take 1 tablet (40 mg total) by mouth daily.     gabapentin 300 MG capsule  Commonly known as:  NEURONTIN  Take by mouth 3 (three) times daily. Reported on 03/15/2016     lisinopril 5 MG tablet  Commonly known as:  PRINIVIL,ZESTRIL  Take 0.5 tablets (2.5 mg total) by mouth daily.     metFORMIN 500 MG tablet  Commonly known as:  GLUCOPHAGE  Please take 1000 mg (2 tablets) in the morning and 500 mg (1 tablet) at night by mouth     multivitamin capsule  Take 1  capsule by mouth daily.     nystatin-triamcinolone ointment  Commonly known as:  MYCOLOG  Apply 1 application topically 2 (two) times daily.     terbinafine 250 MG tablet  Commonly known as:  LAMISIL  Take 1 tablet (250 mg total) by mouth daily.           Total Time in preparing paper work, data evaluation and todays exam - 35 minutes  Dustin Flock M.D on 04/14/2016 at 3:26 PM  [facility]   Office  [phone]

## 2016-04-14 NOTE — Discharge Instructions (Signed)
°  DIET:  Cardiac diet, carbohydrate diet  DISCHARGE CONDITION:  Stable  ACTIVITY:  Activity as tolerated with cane  OXYGEN:  Home Oxygen: No.   Oxygen Delivery: room air  DISCHARGE LOCATION:  home    ADDITIONAL DISCHARGE INSTRUCTION:   If you experience worsening of your admission symptoms, develop shortness of breath, life threatening emergency, suicidal or homicidal thoughts you must seek medical attention immediately by calling 911 or calling your MD immediately  if symptoms less severe.  You Must read complete instructions/literature along with all the possible adverse reactions/side effects for all the Medicines you take and that have been prescribed to you. Take any new Medicines after you have completely understood and accpet all the possible adverse reactions/side effects.   Please note  You were cared for by a hospitalist during your hospital stay. If you have any questions about your discharge medications or the care you received while you were in the hospital after you are discharged, you can call the unit and asked to speak with the hospitalist on call if the hospitalist that took care of you is not available. Once you are discharged, your primary care physician will handle any further medical issues. Please note that NO REFILLS for any discharge medications will be authorized once you are discharged, as it is imperative that you return to your primary care physician (or establish a relationship with a primary care physician if you do not have one) for your aftercare needs so that they can reassess your need for medications and monitor your lab values.

## 2016-04-14 NOTE — Progress Notes (Signed)
Pt was given DME form to take with him. All d/c forms completed , explained and given to pt. Pt verbalizes understanding. All personal belongings returned to pt. Waiting for wife to come

## 2016-04-14 NOTE — Telephone Encounter (Signed)
HFU/ Pt is being discharged from the hospital today. /Dx was Chronic Infammatory Polyneuropathy. Pt is scheduled for 06/29 @ 11:30. Thank you!

## 2016-04-14 NOTE — Telephone Encounter (Signed)
Noted  

## 2016-04-20 ENCOUNTER — Ambulatory Visit: Payer: BLUE CROSS/BLUE SHIELD | Attending: Neurology | Admitting: Physical Therapy

## 2016-04-20 ENCOUNTER — Encounter: Payer: Self-pay | Admitting: Physical Therapy

## 2016-04-20 DIAGNOSIS — M6281 Muscle weakness (generalized): Secondary | ICD-10-CM | POA: Insufficient documentation

## 2016-04-20 DIAGNOSIS — R2681 Unsteadiness on feet: Secondary | ICD-10-CM | POA: Diagnosis present

## 2016-04-20 DIAGNOSIS — M79605 Pain in left leg: Secondary | ICD-10-CM | POA: Diagnosis present

## 2016-04-21 ENCOUNTER — Ambulatory Visit (INDEPENDENT_AMBULATORY_CARE_PROVIDER_SITE_OTHER): Payer: BLUE CROSS/BLUE SHIELD | Admitting: Family Medicine

## 2016-04-21 ENCOUNTER — Encounter: Payer: Self-pay | Admitting: Family Medicine

## 2016-04-21 VITALS — BP 136/74 | HR 99 | Temp 98.7°F | Ht 70.0 in | Wt 172.0 lb

## 2016-04-21 DIAGNOSIS — G479 Sleep disorder, unspecified: Secondary | ICD-10-CM | POA: Insufficient documentation

## 2016-04-21 DIAGNOSIS — E119 Type 2 diabetes mellitus without complications: Secondary | ICD-10-CM

## 2016-04-21 DIAGNOSIS — I10 Essential (primary) hypertension: Secondary | ICD-10-CM

## 2016-04-21 DIAGNOSIS — G6181 Chronic inflammatory demyelinating polyneuritis: Secondary | ICD-10-CM | POA: Diagnosis not present

## 2016-04-21 MED ORDER — ZOLPIDEM TARTRATE 5 MG PO TABS: 5.0000 mg | ORAL_TABLET | Freq: Every evening | ORAL | Status: DC | PRN

## 2016-04-21 NOTE — Assessment & Plan Note (Signed)
Patient was difficulty sleeping as he tries sort out his financial situation given his inability to use his left leg significantly. Denies depression. May be some anxiety surrounding his situation. Discussed that he needs to sort out the disability issue. We'll provide with short course of Ambien to take as needed for sleep. Advised that this can make him drowsy and could change his thought processes. He'll continue to monitor.

## 2016-04-21 NOTE — Progress Notes (Signed)
Pre visit review using our clinic review tool, if applicable. No additional management support is needed unless otherwise documented below in the visit note. 

## 2016-04-21 NOTE — Assessment & Plan Note (Signed)
Significant better control on metformin. Continue to monitor.

## 2016-04-21 NOTE — Patient Instructions (Signed)
Nice to see you. I am glad you are doing better. Your diabetes is well controlled. Please continue the metformin. We will try you on Ambien for your sleep. Be wary as this could make you excessively drowsy or alter your thinking. If this occurs please stop it and let us know. If you develop worsening numbness or weakness, depression, or any new or changing symptoms please seek medical attention.

## 2016-04-21 NOTE — Assessment & Plan Note (Signed)
Well-controlled.  Continue amlodipine and lisinopril. 

## 2016-04-21 NOTE — Progress Notes (Signed)
Patient ID: ROBERT LABRADA, male   DOB: [birth year], 55 y.o.   MRN: [id number]  [name], MD Phone: [phone]  MAKALE NERBY is a 55 y.o. male who presents today for follow-up.  Patient recently admitted to the hospital for chronic inflammatory polyneuropathy. Had seen his neurologist earlier in the day and had a lumbar puncture that revealed elevated protein. He was admitted for IVIG. Noted to be doing well at discharge. Notes his leg is still weak though he started PT yesterday. No progressing weakness or numbness. Has follow-up with neurology in 2 weeks.  Does note some sleep issues regarding worrying about the next step in his care and with his job and income. He has applied for disability and gotten the process started. No depression. Notes he will go to sleep at about 11:00 at night and wake up at 3:57 in the morning and be unable to go back to sleep. Notes he is tired throughout the day related to this. Has been on Ambien previously with good benefit.  DIABETES Disease Monitoring: Blood Sugar ranges-not checking Polyuria/phagia/dipsia- minimal polyuria.      A1c several weeks ago was 5.8 Medications: Compliance- taking metformin Hypoglycemic symptoms- does note rarely he feels as though he has a little bit decreased energy and will eat something and will feel better.  HYPERTENSION Disease Monitoring Home BP Monitoring 130s over 70s Chest pain- no    Dyspnea- no Medications Compliance-  taking amlodipine, lisinopril.  Edema- no   PMH: Smoker   ROS see history of present illness  Objective  Physical Exam Filed Vitals:   04/21/16 1127  BP: 136/74  Pulse: 99  Temp: 98.7 F (37.1 C)    BP Readings from Last 3 Encounters:  04/21/16 136/74  04/14/16 132/71  04/11/16 118/72   Wt Readings from Last 3 Encounters:  04/21/16 172 lb (78.019 kg)  04/14/16 165 lb 12.8 oz (75.206 kg)  03/31/16 167 lb (75.751 kg)    Physical Exam  Constitutional: He is  well-developed, well-nourished, and in no distress.  HENT:  Head: Normocephalic and atraumatic.  Right Ear: External ear normal.  Left Ear: External ear normal.  Cardiovascular: Normal rate, regular rhythm and normal heart sounds.   Pulmonary/Chest: Effort normal and breath sounds normal.  Neurological: He is alert.  CN 2-12 intact, 3 out of 5 strength left quadricep and dorsiflexion, 4 out of 5 strength left hamstring and plantar flexion, otherwise 5/5 strength in bilateral biceps, triceps, grip, right quads, hamstrings, plantar and dorsiflexion, sensation to light touch intact in bilateral UE and LE, favors right side gait, absent patellar reflexes  Skin: Skin is warm and dry. He is not diaphoretic.  Psychiatric: Mood and affect normal.     Assessment/Plan: Please see individual problem list.  CIDP (chronic inflammatory demyelinating polyneuropathy) (HCC) Doing well status post IVIG. Appears neurologically at baseline his left lower extremity. Has started physical therapy. Encouraged follow-up with neurology.   Diabetes ([place]) Significant better control on metformin. Continue to monitor.  Essential hypertension Well-controlled. Continue amlodipine and lisinopril.  Sleeping difficulty Patient was difficulty sleeping as he tries sort out his financial situation given his inability to use his left leg significantly. Denies depression. May be some anxiety surrounding his situation. Discussed that he needs to sort out the disability issue. We'll provide with short course of Ambien to take as needed for sleep. Advised that this can make him drowsy and could change his thought processes. He'll continue to monitor.    No  orders of the defined types were placed in this encounter.    Meds ordered this encounter  Medications  . zolpidem (AMBIEN) 5 MG tablet    Sig: Take 1 tablet (5 mg total) by mouth at bedtime as needed for sleep.    Dispense:  10 tablet    Refill:  0    [name], MD [place]

## 2016-04-21 NOTE — Therapy (Signed)
[place] MAIN [facility] SERVICES [address] [place], [place], [address] Phone: [phone]   Fax:  [phone]  Physical Therapy Evaluation  Patient Details  Name: Michael Montgomery MRN: [id number] Date of Birth: [birth year] Referring Provider: Dr. Manuella Ghazi  Encounter Date: 04/20/2016      PT End of Session - 04/21/16 1003    Visit Number 1   Number of Visits 13   Date for PT Re-Evaluation 06/02/16   PT Start Time 1645   PT Stop Time 1731   PT Time Calculation (min) 46 min   Equipment Utilized During Treatment Gait belt   Activity Tolerance Patient tolerated treatment well;Patient limited by pain   Behavior During Therapy [facility] for tasks assessed/performed      Past Medical History  Diagnosis Date  . Chickenpox   . Gastric ulcer   . Knee injury     left  . Motion sickness     boats  . Cataract march, april    bilateral removed   . Diabetes mellitus without complication (HCC)     metformin   . Heart murmur     When he was younger, no recent issues  . Hypercholesteremia     controlled with medication  . High blood pressure     controlled with meds  . Diabetic amyotrophy [facility])     sees dr at [facility]    Past Surgical History  Procedure Laterality Date  . Cataract extraction w/phaco Right 02/01/2016    Procedure: CATARACT EXTRACTION PHACO AND INTRAOCULAR LENS PLACEMENT ([place]) right;  Surgeon: Ronnell Freshwater, MD;  Location: [place];  Service: Ophthalmology;  Laterality: Right;  DIABETIC - oral meds  . Cataract extraction w/phaco Left 03/07/2016    Procedure: CATARACT EXTRACTION PHACO AND INTRAOCULAR LENS PLACEMENT (I[facility]);  Surgeon: Ronnell Freshwater, MD;  Location: [place];  Service: Ophthalmology;  Laterality: Left;  DIABETIC - oral meds   . Eye surgery      There were no vitals filed for this visit.       Subjective Assessment - 04/20/16 1650    Subjective Patient is a pleasant 55 y.o. male  who reports LLE weakness and pain in L anterior leg, groin, and center of back. Patient reports he began noticing the pain and the start of his weakness in January 2017. Patient states he has had multiple falls since noticing his pain, which occur both inside/outside and usually during walking. He reports his pain is worse at night; with difficutly sleeping because of the pain. 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 He was recently hospitalized for the diabetic amyopathy and received IVIG therapy, which he reports not being beneficial yet but that he was told it may take some time.    Pertinent History Clinical Impairments affecting Rehab: young, quick onset of muscle weakness, mutliple falls   Limitations Sitting   How long can you sit comfortably? 1 hour   How long can you stand comfortably? N/A, except for safety   How long can you walk comfortably? Able to walk long distances just slowly, will limite himself due to fear of falling   Diagnostic tests Lumbar Puncture shows his protein was found to be elevated at 86; Had EMG studies; Last A1C was 5.8   Patient Stated Goals "strengthen my legs and be able to get around"   Currently in Pain? Yes  Pain Score 6    Pain Location Leg   Pain Orientation Left   Pain Descriptors / Indicators Pressure   Pain Type Acute pain   Pain Onset More than a month ago   Pain Frequency Constant   Aggravating Factors  increases at sleep, on it for long period of time    Pain Relieving Factors no relieving factors, balance between moving and staying still   Multiple Pain Sites Yes   Pain Location Groin   Pain Descriptors / Indicators Sharp;Shooting            [facility] PT Assessment - 04/21/16 0001    Assessment   Medical Diagnosis diabetic amyotrophy   Referring Provider Dr. Manuella Ghazi   Onset Date/Surgical Date 10/25/15   Hand Dominance Right   Next MD Visit July 10th    Prior Therapy No prior physical therapy except for a couple times in the hospital    Precautions   Precautions Fall   Restrictions   Weight Bearing Restrictions No   Balance Screen   Has the patient fallen in the past 6 months Yes   How many times? 6   Has the patient had a decrease in activity level because of a fear of falling?  Yes   Is the patient reluctant to leave their home because of a fear of falling?  No   Home Environment   Living Environment Private residence   Living Arrangements Spouse/significant other   Home Access Stairs to enter   Entrance Stairs-Number of Steps 7   Entrance Stairs-Rails Can reach both   [place] - single point   Additional Comments No steps once in home; difficulty getting into/out of shower due to tub.   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 standing long periods of time   Leisure cook, read   Cognition   Overall Cognitive Status Within Functional Limits for tasks assessed   Attention Focused   Observation/Other Assessments   Observations Slight atrophy of LLE compared to RLE.   Lower Extremity Functional Scale  38/80, lower score indicates greater disability   Sensation   Light Touch Impaired by gross assessment   Additional Comments Patient has decreased sensation to light touch on LLE      Coordination   Gross Motor Movements are Fluid and Coordinated Yes   Fine Motor Movements are Fluid and Coordinated Yes   Finger Nose Finger Test Normal   Heel Shin Test Impaired, due to strength deficits   Posture/Postural Control   Posture Comments Seated with upright posture; forward head posture   AROM   Overall AROM Comments AROM WFL except LLE; unable to complete full ROM due to weakness, full PROM   Strength   Overall Strength Comments General strength 5/5 BUE   Right Hip Flexion 4+/5   Right Hip  ABduction 4+/5   Right Hip ADduction 4+/5   Left Hip Flexion 2/5   Left Hip ABduction 3+/5   Left Hip ADduction 4-/5   Right Knee Flexion 4+/5   Right Knee Extension 4+/5   Left Knee Flexion 3+/5   Left Knee Extension 2+/5   Right Ankle Dorsiflexion 5/5   Left Ankle Dorsiflexion 4+/5   Palpation   Palpation comment With palpation, patient is tender with noted tightness in quad muscle    Transfers   Comments Patient unable to perform sit to  stand without use of UE for assistance.    Ambulation/Gait   Gait Comments Patient has a very slow gait; decreased step length bilaterally, difficulty sequencing with cane in RUE, reports increased pain with weight on LLE   Standardized Balance Assessment   Five times sit to stand comments  24.6 seconds, <60 y.o. >10 seconds indications falls risk   10 Meter Walk 0.34 m/s, household ambulator with increased risk of falls; dependent in ADLs/IADLs   High Level Balance   High Level Balance Comments Patient able to stand with eyes open/closed with no AD, 15 seconds each, minimal swaying                         PT Education - 04/21/16 1002    Education provided Yes   Education Details Cane placement with ambulation   Person(s) Educated Patient   Methods Explanation   Comprehension Verbalized understanding             PT Long Term Goals - 04/21/16 1008    PT LONG TERM GOAL #1   Title Patient will be independent in HEP in order to increase patient's ability to maintain gains achieved in therapy and assist with return to PLOF.    Time 6   Period Weeks   Status New   PT LONG TERM GOAL #2   Title Patient will increase overall strength to 4/5 in LLE in order to complete transfers and ambulation safely with decreased risk for falls.   Time 6   Period Weeks   Status New   PT LONG TERM GOAL #3   Title Patient will decrease the time it takes to perform 5 times sit to stand to <10 seconds without UE use in order to decrease his  risk for falls.     Time 6   Period Weeks   Status New   PT LONG TERM GOAL #4   Title Patient will increase his 10 m walk test time to >1.0 m/s in order to be a limited community ambulator at decreased risk for falls.    Time 6   Period Weeks   Status New   PT LONG TERM GOAL #5   Title Patient will report worst pain </= 4 points on the VAS in lower extremity in order to allow patient to perform ADLs and leisure activities with reduced symptoms.    Time 6   Period Weeks   Status New   Additional Long Term Goals   Additional Long Term Goals Yes   PT LONG TERM GOAL #6   Title Patient will increase lower extremity functional scale to >60/80 to demonstrate improved functional mobility and increased tolerance with ADLs.    Time 6   Period Weeks   Status New               Plan - 04/21/16 1004    Clinical Impression Statement Patient presents to initial examination with reports of LLE weakness and numbness, pain in LLE, groin, and back. He has been diagnosed with diabetic amyotrophy.  LLE appears to have atrophy compared to RLE, with significant weakness noted in patient's inability to perform hip flexion and knee extension actively through full range. Patient has full PROM indicating that muscle weakness is the issue in achieving full AROM, not mobility.  Patient reports having mutliple falls at home. He is at high fall risk based on 5x STS and 10 m/s walk test, which also show his functional  independence is greatly decreased for his young age. Patient has unnatural gait pattern with decreased step length bilaterally, decreased gait speed, wide base of support, and difficulty sequencing steps with cane in RUE. Patient would benefit from skilled PT in order to address LE weakness and pain as well as improve safety, balance, and functional independence with decreased pain.   Rehab Potential Good   Clinical Impairments Affecting Rehab Potential Postive Factors: young, motivated; Negative  Factors: falls, symptoms since January, extreme muscle weakness; Clinical Presentation: evolving due to possible progression of symptoms, extreme muscle weakness, and multiple falls   PT Frequency 2x / week   PT Duration 6 weeks   PT Treatment/Interventions ADLs/Self Care Home Management;Aquatic Therapy;Biofeedback;Cryotherapy;Electrical Stimulation;Moist Heat;Traction;DME Instruction;Gait training;Stair training;Functional mobility training;Therapeutic activities;Therapeutic exercise;Balance training;Manual techniques;Patient/family education;Neuromuscular re-education;Passive range of motion;Energy conservation   PT Next Visit Plan Lumbar/hip special tests, reflexes, HEP initiation   PT Home Exercise Plan HEP deferred to next session;    Consulted and Agree with Plan of Care Patient      Patient will benefit from skilled therapeutic intervention in order to improve the following deficits and impairments:  Abnormal gait, Decreased activity tolerance, Decreased balance, Decreased mobility, Decreased range of motion, Decreased strength, Impaired flexibility, Postural dysfunction, Improper body mechanics, Impaired sensation, Pain, Decreased safety awareness, Decreased knowledge of use of DME, Decreased endurance  Visit Diagnosis: Muscle weakness (generalized) - Plan: PT plan of care cert/re-cert  Unsteadiness on feet - Plan: PT plan of care cert/re-cert  Pain In Left Leg - Plan: PT plan of care cert/re-cert     Problem List Patient Active Problem List   Diagnosis Date Noted  . CIDP (chronic inflammatory demyelinating polyneuropathy) ([place]) 04/11/2016  . Erectile dysfunction of organic origin 03/17/2016  . Phimosis 03/17/2016  . BPH with obstruction/lower urinary tract symptoms 03/17/2016  . Gingivitis 03/01/2016  . Diabetes ([place]) 02/07/2016  . Erectile dysfunction 02/07/2016  . Muscle wasting and atrophy, not elsewhere classified, unspecified lower leg 01/21/2016  . Left leg weakness  01/21/2016  . Essential hypertension 01/19/2016  . Cataracts, bilateral 01/19/2016  . Left knee pain 01/19/2016   Tilman Neat, SPT This entire session was performed under direct supervision and direction of a licensed therapist/therapist assistant . I have personally read, edited and approve of the note as written.  Trotter,Margaret PT,DPT 04/21/2016, 10:59 AM  [place] MAIN [facility] SERVICES [address] [place], [place], [address] Phone: [phone]   Fax:  [phone]  Name: Michael Montgomery MRN: [id number] Date of Birth: [birth year]

## 2016-04-21 NOTE — Assessment & Plan Note (Addendum)
Doing well status post IVIG. Appears neurologically at baseline his left lower extremity. Has started physical therapy. Encouraged follow-up with neurology.

## 2016-04-22 ENCOUNTER — Ambulatory Visit: Payer: BLUE CROSS/BLUE SHIELD | Admitting: Physical Therapy

## 2016-04-27 ENCOUNTER — Encounter: Payer: Self-pay | Admitting: Physical Therapy

## 2016-04-27 ENCOUNTER — Ambulatory Visit: Payer: BLUE CROSS/BLUE SHIELD | Attending: Neurology | Admitting: Physical Therapy

## 2016-04-27 ENCOUNTER — Encounter: Payer: BLUE CROSS/BLUE SHIELD | Admitting: Physical Therapy

## 2016-04-27 DIAGNOSIS — R2681 Unsteadiness on feet: Secondary | ICD-10-CM

## 2016-04-27 DIAGNOSIS — M79605 Pain in left leg: Secondary | ICD-10-CM | POA: Diagnosis present

## 2016-04-27 DIAGNOSIS — M6281 Muscle weakness (generalized): Secondary | ICD-10-CM | POA: Diagnosis not present

## 2016-04-27 NOTE — Therapy (Signed)
[place] MAIN [facility] SERVICES [address] [place], [place], [address] Phone: [phone]   Fax:  [phone]  Physical Therapy Treatment  Patient Details  Name: Michael Montgomery MRN: [id number] Date of Birth: [birth year] Referring Provider: Dr. Manuella Ghazi  Encounter Date: 04/27/2016      PT End of Session - 04/27/16 1357    Visit Number 2   Number of Visits 13   Date for PT Re-Evaluation 06/02/16   PT Start Time 1307   PT Stop Time 1346   PT Time Calculation (min) 39 min   Equipment Utilized During Treatment Gait belt   Activity Tolerance Patient tolerated treatment well;Patient limited by pain   Behavior During Therapy [facility] for tasks assessed/performed      Past Medical History  Diagnosis Date  . Chickenpox   . Gastric ulcer   . Knee injury     left  . Motion sickness     boats  . Cataract march, april    bilateral removed   . Diabetes mellitus without complication (HCC)     metformin   . Heart murmur     When he was younger, no recent issues  . Hypercholesteremia     controlled with medication  . High blood pressure     controlled with meds  . Diabetic amyotrophy [facility])     sees dr at [facility]    Past Surgical History  Procedure Laterality Date  . Cataract extraction w/phaco Right 02/01/2016    Procedure: CATARACT EXTRACTION PHACO AND INTRAOCULAR LENS PLACEMENT ([place]) right;  Surgeon: Ronnell Freshwater, MD;  Location: [place];  Service: Ophthalmology;  Laterality: Right;  DIABETIC - oral meds  . Cataract extraction w/phaco Left 03/07/2016    Procedure: CATARACT EXTRACTION PHACO AND INTRAOCULAR LENS PLACEMENT ([facility]);  Surgeon: Ronnell Freshwater, MD;  Location: [place];  Service: Ophthalmology;  Laterality: Left;  DIABETIC - oral meds   . Eye surgery      There were no vitals filed for this visit.      Subjective Assessment - 04/27/16 1310    Subjective Patient reports feeling better today  due to just having "a good day". Patient reported he had a fall when getting out of his shower since he was seen last.   Pertinent History Clinical Impairments affecting Rehab: young, quick onset of muscle weakness, mutliple falls   Limitations Sitting   How long can you sit comfortably? 1 hour   How long can you stand comfortably? N/A, except for safety   How long can you walk comfortably? Able to walk long distances just slowly, will limite himself due to fear of falling   Diagnostic tests Lumbar Puncture shows his protein was found to be elevated at 86; Had EMG studies; Last A1C was 5.8   Patient Stated Goals "strengthen my legs and be able to get around"   Currently in Pain? Yes   Pain Score 6    Pain Location Back   Pain Orientation Left   Pain Descriptors / Indicators Pressure   Pain Onset More than a month ago   Pain Frequency Constant   Multiple Pain Sites Yes   Pain Score 6   Pain Location Groin   Pain Descriptors / Indicators Pressure            [facility] PT Assessment - 04/27/16 0001    Strength   Right Hip Extension 3+/5   Right Hip ABduction 3+/5  sidelying  Left Hip Extension 3+/5   Left Hip External Rotation --   Left Hip ABduction 2+/5  sidelying   FABER test   findings Positive   Side LEft   Comment Increase in intensity of low back and groin pain   Slump test   Findings Positive   Comment Although positive, not reproducible for patient's symptoms   Prone Knee Bend Test   Findings Positive   Side Left   Comment Pain in anterior thigh, decreased range   Straight Leg Raise   Findings Negative   Side  Left   Comment Limited by strength, slight increase in anterior hip pain   Ely's Test   Findings Positive   Side Left;Right   Comments Unable to get past 90 degrees, increased pain in anterior thigh LLE   Hip Scouring   Findings Positive   Side Left   Comments Reproduces back pain, no hip pain      Treatment:  Hip and lumbar special test performed  to identify cause and irritants of pain.  Hip extension and abduction is weak. Patient substitutes with TFL, bilaterally when performing hip abduction in sidelying.   LAQ; 3 x 10, VCs to improve range Supine towel slides (hip/knee flexion); 2 x 5; VCs to perform with good eccentric control to decrease snapping of knee posteriorly. Sit to stands x 10, sit to stands with LLE placed posteriorly x 5; no UE used, from higher surface, VCs to distribute weight equally.    Exercises listed above given for HEP.   Patient instructed on use of lumbar roll and take breaks during long car rides.   Instructed to perform repeated flexion (x3) and extension (x10); patient has no change in pain status with either movements.   Throughout, patient required min VCs in order to perform exercises with appropriate technique to target LE muscles.                        PT Education - 04/27/16 1357    Education provided Yes   Education Details HEP initiated, sitting posture   Person(s) Educated Patient   Methods Explanation;Verbal cues;Demonstration   Comprehension Verbalized understanding;Returned demonstration             PT Long Term Goals - 04/21/16 1008    PT LONG TERM GOAL #1   Title Patient will be independent in HEP in order to increase patient's ability to maintain gains achieved in therapy and assist with return to PLOF.    Time 6   Period Weeks   Status New   PT LONG TERM GOAL #2   Title Patient will increase overall strength to 4/5 in LLE in order to complete transfers and ambulation safely with decreased risk for falls.   Time 6   Period Weeks   Status New   PT LONG TERM GOAL #3   Title Patient will decrease the time it takes to perform 5 times sit to stand to <10 seconds without UE use in order to decrease his risk for falls.     Time 6   Period Weeks   Status New   PT LONG TERM GOAL #4   Title Patient will increase his 10 m walk test time to >1.0 m/s in order  to be a limited community ambulator at decreased risk for falls.    Time 6   Period Weeks   Status New   PT LONG TERM GOAL #5   Title Patient will  report worst pain </= 4 points on the VAS in lower extremity in order to allow patient to perform ADLs and leisure activities with reduced symptoms.    Time 6   Period Weeks   Status New   Additional Long Term Goals   Additional Long Term Goals Yes   PT LONG TERM GOAL #6   Title Patient will increase lower extremity functional scale to >60/80 to demonstrate improved functional mobility and increased tolerance with ADLs.    Time 6   Period Weeks   Status New               Plan - 04/27/16 1358    Clinical Impression Statement Patient continues to exhibits LLE weakness with pain in LLE, groin, and back. Patient was able to respond to treatment well with improved range of motion performed as his sets continued. Throughout pain and hip tests, patient often has an increase in pain; except he states he feels relief with BLE distraction. Patient continues to use DME in LUE although he knows it would be more beneficial in RUE. Patient would continue to benefit from skilled PT in order to address weakness, balance, safety, and independence with decreased pain.   Rehab Potential Good   Clinical Impairments Affecting Rehab Potential Postive Factors: young, motivated; Negative Factors: falls, symptoms since January, extreme muscle weakness; Clinical Presentation: evolving due to possible progression of symptoms, extreme muscle weakness, and multiple falls   PT Frequency 2x / week   PT Duration 6 weeks   PT Treatment/Interventions ADLs/Self Care Home Management;Aquatic Therapy;Biofeedback;Cryotherapy;Electrical Stimulation;Moist Heat;Traction;DME Instruction;Gait training;Stair training;Functional mobility training;Therapeutic activities;Therapeutic exercise;Balance training;Manual techniques;Patient/family education;Neuromuscular re-education;Passive  range of motion;Energy conservation   PT Next Visit Plan Lumbar/hip special tests, reflexes, HEP initiation   PT Home Exercise Plan HEP deferred to next session;    Consulted and Agree with Plan of Care Patient      Patient will benefit from skilled therapeutic intervention in order to improve the following deficits and impairments:  Abnormal gait, Decreased activity tolerance, Decreased balance, Decreased mobility, Decreased range of motion, Decreased strength, Impaired flexibility, Postural dysfunction, Improper body mechanics, Impaired sensation, Pain, Decreased safety awareness, Decreased knowledge of use of DME, Decreased endurance  Visit Diagnosis: Muscle weakness (generalized)  Unsteadiness on feet  Pain In Left Leg     Problem List Patient Active Problem List   Diagnosis Date Noted  . Sleeping difficulty 04/21/2016  . CIDP (chronic inflammatory demyelinating polyneuropathy) ([place]) 04/11/2016  . Erectile dysfunction of organic origin 03/17/2016  . Phimosis 03/17/2016  . BPH with obstruction/lower urinary tract symptoms 03/17/2016  . Gingivitis 03/01/2016  . Diabetes ([place]) 02/07/2016  . Erectile dysfunction 02/07/2016  . Muscle wasting and atrophy, not elsewhere classified, unspecified lower leg 01/21/2016  . Left leg weakness 01/21/2016  . Essential hypertension 01/19/2016  . Cataracts, bilateral 01/19/2016  . Left knee pain 01/19/2016   Tilman Neat, SPT This entire session was performed under direct supervision and direction of a licensed therapist/therapist assistant . I have personally read, edited and approve of the note as written.   Trotter,Margaret PT, DPT 04/27/2016, 5:03 PM   [place] MAIN [facility] SERVICES [address] [place], [place], [address] Phone: [phone]   Fax:  [phone]  Name: Michael Montgomery MRN: [id number] Date of Birth: 1961/07/03

## 2016-04-27 NOTE — Patient Instructions (Signed)
Heel Slides   Slide left heel along bed towards bottom. Hold for _1-2__ seconds. Slide back to flat knee position. Repeat __5_ times. Do _4__ times a day. 2 sets in the morning 2 in the evening     Copyright  VHI. All rights reserved.  EXTENSION: Sitting - Exercise Ball (Active)    Sit with feet flat. Straighten right knee. Complete __3_ sets of __10_ repetitions. Perform __1_ sessions per day.  http://gtsc.exer.us/274   Copyright  VHI. All rights reserved.  Sitting to Standing    With straight back, tighten stomach, place right leg back under chair, lean slightly forward and stand. Repeat __5__ times per set. Do __2__ sets per session. Do _2___ sessions per day.  http://orth.exer.us/1140   Copyright  VHI. All rights reserved.

## 2016-04-29 ENCOUNTER — Encounter: Payer: Self-pay | Admitting: Urology

## 2016-04-29 ENCOUNTER — Telehealth: Payer: Self-pay | Admitting: Family Medicine

## 2016-04-29 ENCOUNTER — Ambulatory Visit (INDEPENDENT_AMBULATORY_CARE_PROVIDER_SITE_OTHER): Payer: BLUE CROSS/BLUE SHIELD | Admitting: Urology

## 2016-04-29 ENCOUNTER — Ambulatory Visit: Payer: BLUE CROSS/BLUE SHIELD | Admitting: Physical Therapy

## 2016-04-29 ENCOUNTER — Telehealth: Payer: Self-pay | Admitting: Urology

## 2016-04-29 VITALS — BP 130/84 | HR 89 | Ht 70.0 in | Wt 170.7 lb

## 2016-04-29 DIAGNOSIS — N138 Other obstructive and reflux uropathy: Secondary | ICD-10-CM

## 2016-04-29 DIAGNOSIS — N528 Other male erectile dysfunction: Secondary | ICD-10-CM | POA: Diagnosis not present

## 2016-04-29 DIAGNOSIS — E291 Testicular hypofunction: Secondary | ICD-10-CM

## 2016-04-29 DIAGNOSIS — N471 Phimosis: Secondary | ICD-10-CM

## 2016-04-29 DIAGNOSIS — N529 Male erectile dysfunction, unspecified: Secondary | ICD-10-CM

## 2016-04-29 DIAGNOSIS — N401 Enlarged prostate with lower urinary tract symptoms: Secondary | ICD-10-CM

## 2016-04-29 MED ORDER — CLOMIPHENE CITRATE 50 MG PO TABS: ORAL_TABLET | ORAL | Status: DC

## 2016-04-29 NOTE — Telephone Encounter (Signed)
Spoke with patient and informed him that we never received any forms. He is going to contact disability to send the forms to [place]

## 2016-04-29 NOTE — Telephone Encounter (Signed)
Please call in the Trimix injections to [place] for the patient.

## 2016-04-29 NOTE — Telephone Encounter (Signed)
Was this faxed?

## 2016-04-29 NOTE — Telephone Encounter (Signed)
Pt called to follow up on his disability form pt states it was supposed to have been faxed and they state they have not receive the form. Pt dropped off form around May 2017.   Call pt @ [phone]. Thank you!

## 2016-04-29 NOTE — Progress Notes (Signed)
9:59 AM   Michael Montgomery 1961-06-17 [id number]  Referring provider: Leone Haven, MD [address] [place], [place] [address]  Chief Complaint  Patient presents with  . Erectile Dysfunction    1 month follow up    HPI: Patient is a 55 year old diabetic AAM who presents today for a recheck after taking Cialis for ED and hypogonadism.     Erectile dysfunction His SHIM score is 7, which is severe erectile dysfunction.   He has been having difficulty with erections for 1 year.   His major complaint is achieving and maintaining an erection.  His libido is diminished.   His risk factors for ED are age, BPH, smoking, diabetes, HTN and HLD.  He denies any painful erections or curvatures with his erections.   He is no longer having spontaneous erections.   He has tried Viagra and Cialis in the past without success.        SHIM      03/15/16 1431 04/29/16 0937     SHIM: Over the last 6 months:   How do you rate your confidence that you could get and keep an erection? Very Low Very Low    When you had erections with sexual stimulation, how often were your erections hard enough for penetration (entering your partner)? Almost Never or Never Almost Never or Never    During sexual intercourse, how often were you able to maintain your erection after you had penetrated (entered) your partner? Extremely Difficult Extremely Difficult    During sexual intercourse, how difficult was it to maintain your erection to completion of intercourse? Extremely Difficult Very Difficult    When you attempted sexual intercourse, how often was it satisfactory for you? Extremely Difficult Very Difficult    SHIM Total Score   SHIM 5 7       Score: 1-7 Severe ED 8-11 Moderate ED 12-16 Mild-Moderate ED 17-21 Mild ED 22-25 No ED  Patient is experiencing a decrease in libido and erections being less strong.  Patient's testosterone was found to be 298.54 ng/dL on 02/04/2016 and 314 ng/dL on  03/17/2016.  His LFT's are normal.    His IPSS score today was 2, which is mild lower urinary tract symptomatology. He is pleased with his quality life due to his urinary symptoms. He denies any dysuria, hematuria or suprapubic pain.   He also denies any recent fevers, chills, nausea or vomiting.  He does not have a family history of PCa.      IPSS      03/15/16 1400       International Prostate Symptom Score   How often have you had the sensation of not emptying your bladder? Not at All     How often have you had to urinate less than every two hours? Less than 1 in 5 times     How often have you found you stopped and started again several times when you urinated? Not at All     How often have you found it difficult to postpone urination? Not at All     How often have you had a weak urinary stream? Not at All     How often have you had to strain to start urination? Not at All     How many times did you typically get up at night to urinate? 1 Time     Total IPSS Score 2     Quality of Life due to urinary  symptoms   If you were to spend the rest of your life with your urinary condition just the way it is now how would you feel about that? Pleased        Score:  1-7 Mild 8-19 Moderate 20-35 Severe    PMH: Past Medical History  Diagnosis Date  . Chickenpox   . Gastric ulcer   . Knee injury     left  . Motion sickness     boats  . Cataract march, april    bilateral removed   . Diabetes mellitus without complication (HCC)     metformin   . Heart murmur     When he was younger, no recent issues  . Hypercholesteremia     controlled with medication  . High blood pressure     controlled with meds  . 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 Cataract extraction w/phaco Right 02/01/2016    Procedure: CATARACT EXTRACTION PHACO AND INTRAOCULAR LENS PLACEMENT ([place]) right;  Surgeon: Ronnell Freshwater, MD;  Location: [place];  Service: Ophthalmology;  Laterality: Right;  DIABETIC - oral meds  . Cataract extraction w/phaco Left 03/07/2016    Procedure: CATARACT EXTRACTION PHACO AND INTRAOCULAR LENS PLACEMENT ([facility]);  Surgeon: Ronnell Freshwater, MD;  Location: [place];  Service: Ophthalmology;  Laterality: Left;  DIABETIC - oral meds   . Eye surgery      Home Medications:    Medication List       This list is accurate as of: 04/29/16  9:59 AM.  Always use your most recent med list.               amLODipine 10 MG tablet  Commonly known as:  NORVASC  Take 1 tablet (10 mg total) by mouth daily.     atorvastatin 40 MG tablet  Commonly known as:  LIPITOR  Take 1 tablet (40 mg total) by mouth daily.     clomiPHENE 50 MG tablet  Commonly known as:  CLOMID  Take 1/2 tablet daily     gabapentin 300 MG capsule  Commonly known as:  NEURONTIN  Take by mouth 3 (three) times daily. Reported on 03/15/2016     gabapentin 600 MG tablet  Commonly known as:  NEURONTIN     lisinopril 5 MG tablet  Commonly known as:  PRINIVIL,ZESTRIL  Take 0.5 tablets (2.5 mg total) by mouth daily.     metFORMIN 500 MG tablet  Commonly known as:  GLUCOPHAGE  Please take 1000 mg (2 tablets) in the morning and 500 mg (1 tablet) at night by mouth     multivitamin capsule  Take 1 capsule by mouth daily.     nystatin-triamcinolone ointment  Commonly known as:  MYCOLOG  Apply 1 application topically 2 (two) times daily.     terbinafine 250 MG tablet  Commonly known as:  LAMISIL  Take 1 tablet (250 mg total) by mouth daily.     VIAGRA 50 MG tablet  Generic drug:  sildenafil  Reported on 04/29/2016     zolpidem 5 MG tablet  Commonly known as:  AMBIEN  Take 1 tablet (5 mg total) by mouth at bedtime as needed for sleep.        Allergies: No Known Allergies  Family History: Family History  Problem Relation Age of Onset  . Alcoholism Father   .  Hyperlipidemia Father   . Hypertension Father   . Throat cancer Father     smoked but had stopped 20 yeras before dx  . Diabetes Mother   . Kidney disease Neg Hx   . Prostate cancer Neg Hx   . Colon cancer Neg Hx   . Colon polyps Neg Hx   . Esophageal cancer Neg Hx   . Rectal cancer Neg Hx   . Stomach cancer Neg Hx     Social History:  reports that he has been smoking Cigars.  He has quit using smokeless tobacco. He reports that he drinks alcohol. He reports that he does not use illicit drugs.  ROS: UROLOGY Frequent Urination?: No Hard to postpone urination?: No Burning/pain with urination?: No Get up at night to urinate?: No Leakage of urine?: No Urine stream starts and stops?: No Trouble starting stream?: No Do you have to strain to urinate?: No Blood in urine?: No Urinary tract infection?: No Sexually transmitted disease?: No Injury to kidneys or bladder?: No Painful intercourse?: No Weak stream?: No Erection problems?: Yes Penile pain?: No  Gastrointestinal Nausea?: No Vomiting?: No Indigestion/heartburn?: No Diarrhea?: No Constipation?: No  Constitutional Fever: No Night sweats?: No Weight loss?: No Fatigue?: No  Skin Skin rash/lesions?: No Itching?: No  Eyes Blurred vision?: No Double vision?: No  Ears/Nose/Throat Sore throat?: No Sinus problems?: No  Hematologic/Lymphatic Swollen glands?: No Easy bruising?: No  Cardiovascular Leg swelling?: No Chest pain?: No  Respiratory Cough?: No Shortness of breath?: No  Endocrine Excessive thirst?: No  Musculoskeletal Back pain?: No Joint pain?: No  Neurological Headaches?: No Dizziness?: No  Psychologic Depression?: No Anxiety?: No  Physical Exam: BP 130/84 mmHg  Pulse 89  Ht _0  (1.778 m)  Wt 170 lb 11.2 oz (77.429 kg)  BMI 24.49 kg/m2  Constitutional: Well nourished. Alert and oriented, No acute distress. HEENT: [place] AT, moist mucus membranes. Trachea midline, no  masses. Cardiovascular: No clubbing, cyanosis, or edema. Respiratory: Normal respiratory effort, no increased work of breathing. Skin: No rashes, bruises or suspicious lesions. Lymph: No cervical or inguinal adenopathy. Neurologic: Grossly intact, no focal deficits, moving all 4 extremities. Psychiatric: Normal mood and affect.  Laboratory Data: Lab Results  Component Value Date   WBC 5.4 04/11/2016   HGB 14.5 04/11/2016   HCT 43.4 04/11/2016   MCV 88.0 04/11/2016   PLT 211 04/11/2016    Lab Results  Component Value Date   CREATININE 0.75 04/13/2016     Lab Results  Component Value Date   TESTOSTERONE 314* 03/17/2016    Lab Results  Component Value Date   HGBA1C 5.8 04/11/2016    Lab Results  Component Value Date   TSH 1.68 01/19/2016       Component Value Date/Time   CHOL 197 01/19/2016 1131   HDL 45.00 01/19/2016 1131   CHOLHDL 4 01/19/2016 1131   VLDL 22.0 01/19/2016 1131   LDLCALC 130* 01/19/2016 1131    Lab Results  Component Value Date   AST 19 04/11/2016   Lab Results  Component Value Date   ALT 17 04/11/2016    Assessment & Plan:    1.  Erectile dysfunction:   SHIM score is 6.  PDE5-inhibitors have not been effective.  He would like to move on to the intracavernousal injections.  A script will be called to [facility].  He will contact the office to schedule his test dose.   2. BPH with LUTS:   IPSS score is  2/1.  We will continue to monitor, as testosterone therapy can worsen BPH.  3. Hypogonadism:   Patient has met criteria for hypogonadism.  I discussed with the patient the side effects of testosterone therapy, such as: enlargement of the prostate gland that may in turn cause LUTS, possible increased risk of PCa, DVT's and/or PE's, possible increased risk of heart attack or stroke, lower sperm count, swelling of the ankles, feet, or body, with or without heart failure, enlarged or painful breasts, have problems breathing while you  sleep (sleep apnea), increased prostate specific antigen, mood swings, hypertension and increased red blood cell count.  I also discussed that some men have had success using clomid for hypogonadism.  It does seem to be more successful in younger men, but there are incidences of good results in middle-aged men.  I explained that it is used in male infertility to stimulate the testicles to make more testosterone/sperm.  There has been no long term data on side effects, but some urologists has been having success with this medication.   He would like to start Clomid at this time.  A script is sent to his pharmacy.  He will RTC in one month for a morning testosterone level between 8-10AM.  4. Phimosis:  Patient states it is improving.   Return in about 1 month (around 05/30/2016) for serum testoterone level between 8 and 10 AM.  These notes generated with voice recognition software. I apologize for typographical errors.  Zara Council, [facility] [address] [place], [place] [address] [phone]

## 2016-05-02 ENCOUNTER — Ambulatory Visit: Payer: BLUE CROSS/BLUE SHIELD | Admitting: Physical Therapy

## 2016-05-02 ENCOUNTER — Encounter: Payer: Self-pay | Admitting: Physical Therapy

## 2016-05-02 DIAGNOSIS — M6281 Muscle weakness (generalized): Secondary | ICD-10-CM

## 2016-05-02 DIAGNOSIS — R2681 Unsteadiness on feet: Secondary | ICD-10-CM

## 2016-05-02 DIAGNOSIS — M79605 Pain in left leg: Secondary | ICD-10-CM

## 2016-05-02 NOTE — Patient Instructions (Addendum)
  Quad Sets    Squeeze pelvic floor and hold. Tighten top of left thigh. Hold for _3__ seconds. Relax for __5_ seconds. Repeat __5_ times. Do _3__ times a day. Repeat with other leg.    Copyright  VHI. All rights reserved.  Abduction: Clam (Eccentric) - Side-Lying    Lie on side with knees bent. Lift top knee, keeping feet together. Keep trunk steady. Slowly lower for 3-5 seconds. _10__ reps per set, __2_ sets per day, __5_ days per week.   http://ecce.exer.us/64   Copyright  VHI. All rights reserved.

## 2016-05-02 NOTE — Therapy (Signed)
[place] MAIN [facility] [address] [place], [place], [address] Phone: [phone]   Fax:  [phone]  Physical Therapy Treatment  Patient Details  Name: Michael Montgomery MRN: [id number] Date of Birth: 04-Jan-1961 Referring Provider: Dr. Manuella Ghazi  Encounter Date: 05/02/2016      PT End of Session - 05/02/16 1632    Visit Number 3   Number of Visits 13   Date for PT Re-Evaluation 06/02/16   PT Start Time 1433   PT Stop Time 1516   PT Time Calculation (min) 43 min   Equipment Utilized During Treatment Gait belt   Activity Tolerance Patient tolerated treatment well;Patient limited by pain   Behavior During Therapy [facility] for tasks assessed/performed      Past Medical History  Diagnosis Date  . Chickenpox   . Gastric ulcer   . Knee injury     left  . Motion sickness     boats  . Cataract march, april    bilateral removed   . Diabetes mellitus without complication (HCC)     metformin   . Heart murmur     When he was younger, no recent issues  . Hypercholesteremia     controlled with medication  . High blood pressure     controlled with meds  . 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 Cataract extraction w/phaco Right 02/01/2016    Procedure: CATARACT EXTRACTION PHACO AND INTRAOCULAR LENS PLACEMENT ([place]) right;  Surgeon: Ronnell Freshwater, MD;  Location: [place];  Service: Ophthalmology;  Laterality: Right;  DIABETIC - oral meds  . Cataract extraction w/phaco Left 03/07/2016    Procedure: CATARACT EXTRACTION PHACO AND INTRAOCULAR LENS PLACEMENT ([facility]);  Surgeon: Ronnell Freshwater, MD;  Location: [place];  Service: Ophthalmology;  Laterality: Left;  DIABETIC - oral meds   . Eye surgery      There were no vitals filed for this visit.      Subjective Assessment - 05/02/16 1436    Subjective Patient reports feeling sore today;  denies falls; reports doing HEP 1x day, not sleeping any better at nights even with medication change.    Pertinent History Clinical Impairments affecting Rehab: young, quick onset of muscle weakness, mutliple falls   Limitations Sitting   How long can you sit comfortably? 1 hour   How long can you stand comfortably? N/A, except for safety   How long can you walk comfortably? Able to walk long distances just slowly, will limite himself due to fear of falling   Diagnostic tests Lumbar Puncture shows his protein was found to be elevated at 86; Had EMG studies; Last A1C was 5.8   Patient Stated Goals "strengthen my legs and be able to get around"   Currently in Pain? Yes   Pain Score 6    Pain Location Back   Pain Orientation Left   Pain Descriptors / Indicators Sore;Pressure   Pain Onset More than a month ago   Pain Score 5   Pain Location Leg      Treatment:  Supine heel slides x 8, improved range compared to previous session.  LLE manual distraction, 2 x 15 seconds, no change on numbness in LLE, notes relief   SLR x5, instructed not to hold breath by counting out loud during lifting phase, discontinued due to patient having an extensor lag  Short arc  quads over red bolster, 3x 5, unable to actively achieve full range, increased range with SPT holding hand under L heel.  In between sets, SPT performed PROM with focus on terminal extension, 2 x 10, patient able to attain full PROM  Bridges x 3 , patient reports increase in pain in low back that did not subside, discontinued.  Clam shells, 2 x 10 each LE, 2nd set of RLE yellow tband added. Patient given yellow tband for home; shows independence in donning/doffing.   Supine quad sets, 3 x 5, min-mod verbal and tactile to improve muscle recruitment.   Sit to stands, 2 x5, LLE slightly posterior to RLE to increase strength in LLE.   DME [facility]) instruction with demonstration and min VCs.  On level surface: Patient instructed to  perform 1 lap (90 feet) with instruction to perform with SPC in RUE and proper sequencing. 2nd lap focused on same technique, with increased speed. Stairs: L hand rail upon ascending/descending, cane in opposite hand. Min VCs to perform with SPC ascending, R leg, and L leg in that order. Instructed to descend with cane, L leg, and R leg.  4" single step (simulating curb): Instructed on keeping cane in RUE and performing "up with the good (R), down with the bad (L)" with cane leading both directions.  Throughout patient required min-mod assist and demonstration to understand DME sequencing and to perform exercises with appropriate technique.                               PT Education - 05/02/16 1632    Education provided Yes   Education Details HEP progressed, DME instruction   Person(s) Educated Patient   Methods Explanation;Demonstration;Verbal cues   Comprehension Verbalized understanding;Verbal cues required             PT Long Term Goals - 04/21/16 1008    PT LONG TERM GOAL #1   Title Patient will be independent in HEP in order to increase patient's ability to maintain gains achieved in therapy and assist with return to PLOF.    Time 6   Period Weeks   Status New   PT LONG TERM GOAL #2   Title Patient will increase overall strength to 4/5 in LLE in order to complete transfers and ambulation safely with decreased risk for falls.   Time 6   Period Weeks   Status New   PT LONG TERM GOAL #3   Title Patient will decrease the time it takes to perform 5 times sit to stand to <10 seconds without UE use in order to decrease his risk for falls.     Time 6   Period Weeks   Status New   PT LONG TERM GOAL #4   Title Patient will increase his 10 m walk test time to >1.0 m/s in order to be a limited community ambulator at decreased risk for falls.    Time 6   Period Weeks   Status New   PT LONG TERM GOAL #5   Title Patient will report worst pain </= 4 points on  the VAS in lower extremity in order to allow patient to perform ADLs and leisure activities with reduced symptoms.    Time 6   Period Weeks   Status New   Additional Long Term Goals   Additional Long Term Goals Yes   PT LONG TERM GOAL #6   Title Patient will increase lower  extremity functional scale to >60/80 to demonstrate improved functional mobility and increased tolerance with ADLs.    Time 6   Period Weeks   Status New               Plan - 05/02/16 1633    Clinical Impression Statement Patient has LLE weakness and difficulty performing activities through full range during short arc quads and has a slight extensor lag with SLR. Patient states that all activities are very difficult with quad exercises increasing pain. Patient required min VCs and tactile cues in order to perform exercises with appropriate form especially with increasing quad muscle recruitment. Patient instructed on DME use in the RUE on level ground, stairs, and 4" curb. After instruction, patient demonstrated good form with appropriate sequencing, although he continues to ambulate at a slower speed. Patient would continue to benefit from skilled PT intervention in order to address strength, balance, safety, and independence in functional activities.    Rehab Potential Good   Clinical Impairments Affecting Rehab Potential Postive Factors: young, motivated; Negative Factors: falls, symptoms since January, extreme muscle weakness; Clinical Presentation: evolving due to possible progression of symptoms, extreme muscle weakness, and multiple falls   PT Frequency 2x / week   PT Duration 6 weeks   PT Treatment/Interventions ADLs/Self Care Home Management;Aquatic Therapy;Biofeedback;Cryotherapy;Electrical Stimulation;Moist Heat;Traction;DME Instruction;Gait training;Stair training;Functional mobility training;Therapeutic activities;Therapeutic exercise;Balance training;Manual techniques;Patient/family education;Neuromuscular  re-education;Passive range of motion;Energy conservation   PT Next Visit Plan Re-educate on HEP, balance activities, LE strengthening CKC   PT Home Exercise Plan HEP deferred to next session;    Consulted and Agree with Plan of Care Patient      Patient will benefit from skilled therapeutic intervention in order to improve the following deficits and impairments:  Abnormal gait, Decreased activity tolerance, Decreased balance, Decreased mobility, Decreased range of motion, Decreased strength, Impaired flexibility, Postural dysfunction, Improper body mechanics, Impaired sensation, Pain, Decreased safety awareness, Decreased knowledge of use of DME, Decreased endurance  Visit Diagnosis: Muscle weakness (generalized)  Unsteadiness on feet  Pain In Left Leg     Problem List Patient Active Problem List   Diagnosis Date Noted  . Sleeping difficulty 04/21/2016  . CIDP (chronic inflammatory demyelinating polyneuropathy) ([place]) 04/11/2016  . Erectile dysfunction of organic origin 03/17/2016  . Phimosis 03/17/2016  . BPH with obstruction/lower urinary tract symptoms 03/17/2016  . Gingivitis 03/01/2016  . Diabetes ([place]) 02/07/2016  . Erectile dysfunction 02/07/2016  . Muscle wasting and atrophy, not elsewhere classified, unspecified lower leg 01/21/2016  . Left leg weakness 01/21/2016  . Essential hypertension 01/19/2016  . Cataracts, bilateral 01/19/2016  . Left knee pain 01/19/2016   Tilman Neat, SPT This entire session was performed under direct supervision and direction of a licensed therapist/therapist assistant . I have personally read, edited and approve of the note as written.  Trotter,Margaret PT, DPT 05/03/2016, 8:44 AM   [place] MAIN [facility] SERVICES [address] [place], [place], [address] Phone: [phone]   Fax:  [phone]  Name: Michael Montgomery MRN: [id number] Date of Birth: January 04, 1961

## 2016-05-05 ENCOUNTER — Ambulatory Visit: Payer: BLUE CROSS/BLUE SHIELD | Admitting: Physical Therapy

## 2016-05-05 DIAGNOSIS — R2681 Unsteadiness on feet: Secondary | ICD-10-CM

## 2016-05-05 DIAGNOSIS — M6281 Muscle weakness (generalized): Secondary | ICD-10-CM | POA: Diagnosis not present

## 2016-05-05 DIAGNOSIS — M79605 Pain in left leg: Secondary | ICD-10-CM

## 2016-05-05 NOTE — Telephone Encounter (Signed)
Medication called in to pharmacy.

## 2016-05-05 NOTE — Patient Instructions (Addendum)
   http://orth.exer.us/682   Copyright  VHI. All rights reserved.  Hip Abduction / Adduction: with Knee Flexion (Supine)    With right knee bent, gently lower knee to side and return. Repeat __5__ times per set. Do _2___ sets per session. Do __1-2__ sessions per day.  http://orth.exer.us/682   Copyright  VHI. All rights reserved.

## 2016-05-06 ENCOUNTER — Encounter: Payer: Self-pay | Admitting: Physical Therapy

## 2016-05-06 NOTE — Therapy (Signed)
[place] MAIN [facility] SERVICES [address] [place], [place], [address] Phone: [phone]   Fax:  [phone]  Physical Therapy Treatment  Patient Details  Name: Michael Montgomery MRN: [id number] Date of Birth: [birth year] Referring Provider: Dr. Manuella Ghazi  Encounter Date: 05/05/2016      PT End of Session - 05/06/16 0823    Visit Number 4   Number of Visits 13   Date for PT Re-Evaluation 06/02/16   PT Start Time 1606   PT Stop Time [id number]   PT Time Calculation (min) 41 min   Equipment Utilized During Treatment Gait belt   Activity Tolerance Patient tolerated treatment well;Patient limited by pain   Behavior During Therapy [facility] for tasks assessed/performed      Past Medical History  Diagnosis Date  . Chickenpox   . Gastric ulcer   . Knee injury     left  . Motion sickness     boats  . Cataract march, april    bilateral removed   . Diabetes mellitus without complication (HCC)     metformin   . Heart murmur     When he was younger, no recent issues  . Hypercholesteremia     controlled with medication  . High blood pressure     controlled with meds  . Diabetic amyotrophy [facility])     sees dr at [facility]    Past Surgical History  Procedure Laterality Date  . Cataract extraction w/phaco Right 02/01/2016    Procedure: CATARACT EXTRACTION PHACO AND INTRAOCULAR LENS PLACEMENT ([place] [facility]) right;  Surgeon: Ronnell Freshwater, MD;  Location: [place];  Service: Ophthalmology;  Laterality: Right;  DIABETIC - oral meds  . Cataract extraction w/phaco Left 03/07/2016    Procedure: CATARACT EXTRACTION PHACO AND INTRAOCULAR LENS PLACEMENT ([facility]);  Surgeon: Ronnell Freshwater, MD;  Location: [place];  Service: Ophthalmology;  Laterality: Left;  DIABETIC - oral meds   . Eye surgery      There were no vitals filed for this visit.      Subjective Assessment - 05/06/16 0820    Subjective Patient reports feeling sore again  today but that he wouldn't classify it as pain; denies falls but reports near fall in the bathroom; reports doing HEP. He reports he is still not sleeping well even with medication change and is going to call his MD again to discuss.    Pertinent History Clinical Impairments affecting Rehab: young, quick onset of muscle weakness, mutliple falls   Limitations Sitting   How long can you sit comfortably? 1 hour   How long can you stand comfortably? N/A, except for safety   How long can you walk comfortably? Able to walk long distances just slowly, will limite himself due to fear of falling   Diagnostic tests Lumbar Puncture shows his protein was found to be elevated at 86; Had EMG studies; Last A1C was 5.8   Patient Stated Goals "strengthen my legs and be able to get around"   Currently in Pain? Yes   Pain Score 2    Pain Location Leg   Pain Orientation Left   Pain Descriptors / Indicators Sore   Pain Onset More than a month ago      Treatment:  Warm up on NuStep x 2 minutes, L1, LE only, history taken during, min VCs to improve LLE pushing and to equally use L and R LE.   Supine: Short arc quads, 2 x 10 with BLE,  VCs to increase range on LLE due to slight extensor lag.  Quad sets, x 10, VCs to hold for 2-3 seconds then relax, patient performs quads sets with glut squeeze as compensation, Hip ABD/ADD marches in hooklying, 2x5 each LE, VCs to increase L hip flexion, added to HEP Clam Shells, 2x10 each LE with yellow tband resistance, first set on L no resistance. Yellow tband given for home, able to don/doff independently.  Sit to stands x 6, instructed to perform with LLE slightly posterior to R in order to increase LLE strengthening.   Standing on airex, feet together, no UE support, x30 seconds, good stability  Tandem stance, 2 x 30 seconds, each LE leading, increased sway, no HHA  Re-educated on DME ([facility]) use on stairs. Performed stairs 2x4, VCs required to put cane first, R leg, then  left leg. Also needed cueing for sequencing descent. Lateral side stepping in // bars, x3 passes, each LE leading, VCs to keep feet facing forward    Toe/heel raises, x10, 2 x 5, difficulty performing PF through full range, Minimal BUE support for stability, instructed to take break when difficult, rest, and perform again to improve form. Mini squats, 2 x 10, VCs to increase weight bearing on LLE.   Weight bearing shifts while reaching to external cues, on firm surface, x 10 UE reaches    Patient requires min assist to improve exercise technique and DME sequencing.                           PT Education - 05/06/16 [phone]    Education provided Yes   Education Details Re-educated on DME [facility]), HEP progressed    Person(s) Educated Patient   Methods Explanation;Demonstration;Verbal cues   Comprehension Verbal cues required;Verbalized understanding;Returned demonstration             PT Long Term Goals - 04/21/16 1008    PT LONG TERM GOAL #1   Title Patient will be independent in HEP in order to increase patient's ability to maintain gains achieved in therapy and assist with return to PLOF.    Time 6   Period Weeks   Status New   PT LONG TERM GOAL #2   Title Patient will increase overall strength to 4/5 in LLE in order to complete transfers and ambulation safely with decreased risk for falls.   Time 6   Period Weeks   Status New   PT LONG TERM GOAL #3   Title Patient will decrease the time it takes to perform 5 times sit to stand to <10 seconds without UE use in order to decrease his risk for falls.     Time 6   Period Weeks   Status New   PT LONG TERM GOAL #4   Title Patient will increase his 10 m walk test time to >1.0 m/s in order to be a limited community ambulator at decreased risk for falls.    Time 6   Period Weeks   Status New   PT LONG TERM GOAL #5   Title Patient will report worst pain </= 4 points on the VAS in lower extremity in order to allow  patient to perform ADLs and leisure activities with reduced symptoms.    Time 6   Period Weeks   Status New   Additional Long Term Goals   Additional Long Term Goals Yes   PT LONG TERM GOAL #6   Title Patient will increase  lower extremity functional scale to >60/80 to demonstrate improved functional mobility and increased tolerance with ADLs.    Time 6   Period Weeks   Status New               Plan - 05/06/16 [id number]    Clinical Impression Statement Patient's ability to perform therex in HEP has improved since last visit. Patient states he is doing his HEP and can also see a difference. He continues to have mild extensor lag when performing SAQ with inability to fully straighten leg. He has improved quad control with quad sets and short arc quad however with improved body awareness. Patient appears motivated  throughout treatment session. CKC therex was initiated today with patient able to perform activities with min-mod VCs to improve form. Patient would continue to benefit from skilled PT in order to address strength, balance, an independence in ADLs.    Rehab Potential Good   Clinical Impairments Affecting Rehab Potential Postive Factors: young, motivated; Negative Factors: falls, symptoms since January, extreme muscle weakness; Clinical Presentation: evolving due to possible progression of symptoms, extreme muscle weakness, and multiple falls   PT Frequency 2x / week   PT Duration 6 weeks   PT Treatment/Interventions ADLs/Self Care Home Management;Aquatic Therapy;Biofeedback;Cryotherapy;Electrical Stimulation;Moist Heat;Traction;DME Instruction;Gait training;Stair training;Functional mobility training;Therapeutic activities;Therapeutic exercise;Balance training;Manual techniques;Patient/family education;Neuromuscular re-education;Passive range of motion;Energy conservation   PT Next Visit Plan Progress HEP, balance activities, progress CKC LE strengthening ensuring LLE involvment   PT  Home Exercise Plan continue HEP as given   Consulted and Agree with Plan of Care Patient      Patient will benefit from skilled therapeutic intervention in order to improve the following deficits and impairments:  Abnormal gait, Decreased activity tolerance, Decreased balance, Decreased mobility, Decreased range of motion, Decreased strength, Impaired flexibility, Postural dysfunction, Improper body mechanics, Impaired sensation, Pain, Decreased safety awareness, Decreased knowledge of use of DME, Decreased endurance  Visit Diagnosis: Muscle weakness (generalized)  Unsteadiness on feet  Pain In Left Leg     Problem List Patient Active Problem List   Diagnosis Date Noted  . Sleeping difficulty 04/21/2016  . CIDP (chronic inflammatory demyelinating polyneuropathy) ([place]) 04/11/2016  . Erectile dysfunction of organic origin 03/17/2016  . Phimosis 03/17/2016  . BPH with obstruction/lower urinary tract symptoms 03/17/2016  . Gingivitis 03/01/2016  . Diabetes ([place]) 02/07/2016  . Erectile dysfunction 02/07/2016  . Muscle wasting and atrophy, not elsewhere classified, unspecified lower leg 01/21/2016  . Left leg weakness 01/21/2016  . Essential hypertension 01/19/2016  . Cataracts, bilateral 01/19/2016  . Left knee pain 01/19/2016   Tilman Neat, SPT This entire session was performed under direct supervision and direction of a licensed therapist/therapist assistant . I have personally read, edited and approve of the note as written.  Trotter,Margaret PT, DPT 05/06/2016, 9:42 AM  [place] MAIN [facility] SERVICES [address] [place], [place], [address] Phone: [phone]   Fax:  [phone]  Name: Michael Montgomery MRN: [id number] Date of Birth: May 20, 1961

## 2016-05-10 ENCOUNTER — Encounter: Payer: Self-pay | Admitting: Physical Therapy

## 2016-05-10 ENCOUNTER — Ambulatory Visit: Payer: BLUE CROSS/BLUE SHIELD | Admitting: Physical Therapy

## 2016-05-10 DIAGNOSIS — M6281 Muscle weakness (generalized): Secondary | ICD-10-CM

## 2016-05-10 DIAGNOSIS — R2681 Unsteadiness on feet: Secondary | ICD-10-CM

## 2016-05-10 DIAGNOSIS — M79605 Pain in left leg: Secondary | ICD-10-CM

## 2016-05-10 NOTE — Therapy (Signed)
[place] MAIN [facility] SERVICES [address] [place], [place], [address] Phone: [phone]   Fax:  [phone]  Physical Therapy Treatment  Patient Details  Name: Michael Montgomery MRN: [id number] Date of Birth: June 06, 1961 Referring Provider: Dr. Manuella Ghazi  Encounter Date: 05/10/2016      PT End of Session - 05/10/16 1438    Visit Number 5   Number of Visits 13   Date for PT Re-Evaluation 06/02/16   PT Start Time 1308   PT Stop Time 1346   PT Time Calculation (min) 38 min   Equipment Utilized During Treatment Gait belt   Activity Tolerance Patient tolerated treatment well   Behavior During Therapy [facility] for tasks assessed/performed      Past Medical History  Diagnosis Date  . Chickenpox   . Gastric ulcer   . Knee injury     left  . Motion sickness     boats  . Cataract march, april    bilateral removed   . Diabetes mellitus without complication (HCC)     metformin   . Heart murmur     When he was younger, no recent issues  . Hypercholesteremia     controlled with medication  . High blood pressure     controlled with meds  . Diabetic amyotrophy [facility])     sees dr at [facility]    Past Surgical History  Procedure Laterality Date  . Cataract extraction w/phaco Right 02/01/2016    Procedure: CATARACT EXTRACTION PHACO AND INTRAOCULAR LENS PLACEMENT ([place]) right;  Surgeon: Ronnell Freshwater, MD;  Location: [place];  Service: Ophthalmology;  Laterality: Right;  DIABETIC - oral meds  . Cataract extraction w/phaco Left 03/07/2016    Procedure: CATARACT EXTRACTION PHACO AND INTRAOCULAR LENS PLACEMENT ([facility]);  Surgeon: Ronnell Freshwater, MD;  Location: [place];  Service: Ophthalmology;  Laterality: Left;  DIABETIC - oral meds   . Eye surgery      There were no vitals filed for this visit.      Subjective Assessment - 05/10/16 1311    Subjective Patient reports feeling sore the past couple days, which he  states is not the same as muscle soreness. Patient denies falls; reports doing HEP.    Pertinent History Clinical Impairments affecting Rehab: young, quick onset of muscle weakness, mutliple falls   Limitations Sitting   How long can you sit comfortably? 1 hour   How long can you stand comfortably? N/A, except for safety   How long can you walk comfortably? Able to walk long distances just slowly, will limite himself due to fear of falling   Diagnostic tests Lumbar Puncture shows his protein was found to be elevated at 86; Had EMG studies; Last A1C was 5.8   Patient Stated Goals "strengthen my legs and be able to get around"   Currently in Pain? Yes   Pain Score 6    Pain Location Groin   Pain Orientation Left   Pain Descriptors / Indicators Sore   Pain Onset More than a month ago   Pain Score 6   Pain Location Leg   Pain Descriptors / Indicators Sore       Treatment:  SAQ on red bolster, little to no extensor lag evident, 2 x10, each LE min VCs to extend knee fully into extension.  LLE Quad sets, x10 reps, improved control, continues to have gluteal muscle contraction and slight abdominal bracing for assistance   LLE marches, x10,  greater clearance with movement and less difficulty  LLE Straight leg raise, 2 x 5, min VCs to increase knee extension throughout range and maintain quad contraction  Sit to stands, x 10 reps with 4.4# weighted ball performing BUE flexion when standing, Instructed to perform with LLE back to increase difficulty  Leg press: BLE leg press 90#, 2 x10, min VCs to decrease bilat. Knee hyperextension LLE Unilateral leg press 30#, 3 x10, min VCS to control eccentric movement  Heel raises, 1 UE support, min VCs and instruction to line body up with a line visually in mirror to maintain equal WB on LLE. Required min VCs to perform with improved control.  Side stepping over wooden beam, x4 passes, CGA-min assist for stability, x2 passes with focus on slight  knee flexion to increase quad activation.                             PT Education - 05/10/16 1438    Education provided Yes   Education Details Re-educated on HEP, Engineering geologist) Educated Patient   Methods Explanation;Tactile cues;Verbal cues;Demonstration   Comprehension Verbalized understanding;Returned demonstration;Verbal cues required             PT Long Term Goals - 04/21/16 1008    PT LONG TERM GOAL #1   Title Patient will be independent in HEP in order to increase patient's ability to maintain gains achieved in therapy and assist with return to PLOF.    Time 6   Period Weeks   Status New   PT LONG TERM GOAL #2   Title Patient will increase overall strength to 4/5 in LLE in order to complete transfers and ambulation safely with decreased risk for falls.   Time 6   Period Weeks   Status New   PT LONG TERM GOAL #3   Title Patient will decrease the time it takes to perform 5 times sit to stand to <10 seconds without UE use in order to decrease his risk for falls.     Time 6   Period Weeks   Status New   PT LONG TERM GOAL #4   Title Patient will increase his 10 m walk test time to >1.0 m/s in order to be a limited community ambulator at decreased risk for falls.    Time 6   Period Weeks   Status New   PT LONG TERM GOAL #5   Title Patient will report worst pain </= 4 points on the VAS in lower extremity in order to allow patient to perform ADLs and leisure activities with reduced symptoms.    Time 6   Period Weeks   Status New   Additional Long Term Goals   Additional Long Term Goals Yes   PT LONG TERM GOAL #6   Title Patient will increase lower extremity functional scale to >60/80 to demonstrate improved functional mobility and increased tolerance with ADLs.    Time 6   Period Weeks   Status New               Plan - 05/10/16 1439    Clinical Impression Statement Patient's technique during supine therex has improved with little  to no extensor lag during SAQ and improved contraction with quad sets. Patient continues to have small extensor lag during SLR, but with multiple VCs is able to maintain quad contraction.  Patient requires min VCs to improve form more in order to  address correct musculature. He has 1 instance of knee instability with slight quad flexion during lateral walking, requires min assist to regain stability and CGA during activity for safety. Patient wouuld continue to benefit from skilled PT in order to address strength, balance and safety, and independence in ADLs.   Rehab Potential Good   Clinical Impairments Affecting Rehab Potential Postive Factors: young, motivated; Negative Factors: falls, symptoms since January, extreme muscle weakness; Clinical Presentation: evolving due to possible progression of symptoms, extreme muscle weakness, and multiple falls   PT Frequency 2x / week   PT Duration 6 weeks   PT Treatment/Interventions ADLs/Self Care Home Management;Aquatic Therapy;Biofeedback;Cryotherapy;Electrical Stimulation;Moist Heat;Traction;DME Instruction;Gait training;Stair training;Functional mobility training;Therapeutic activities;Therapeutic exercise;Balance training;Manual techniques;Patient/family education;Neuromuscular re-education;Passive range of motion;Energy conservation   PT Next Visit Plan Progress HEP, balance activities, progress CKC LE strengthening ensuring LLE involvment, progress leg press, SLR   PT Home Exercise Plan continue HEP as given   Consulted and Agree with Plan of Care Patient      Patient will benefit from skilled therapeutic intervention in order to improve the following deficits and impairments:  Abnormal gait, Decreased activity tolerance, Decreased balance, Decreased mobility, Decreased range of motion, Decreased strength, Impaired flexibility, Postural dysfunction, Improper body mechanics, Impaired sensation, Pain, Decreased safety awareness, Decreased knowledge of use  of DME, Decreased endurance  Visit Diagnosis: Muscle weakness (generalized)  Unsteadiness on feet  Pain In Left Leg     Problem List Patient Active Problem List   Diagnosis Date Noted  . Sleeping difficulty 04/21/2016  . CIDP (chronic inflammatory demyelinating polyneuropathy) ([place]) 04/11/2016  . Erectile dysfunction of organic origin 03/17/2016  . Phimosis 03/17/2016  . BPH with obstruction/lower urinary tract symptoms 03/17/2016  . Gingivitis 03/01/2016  . Diabetes ([place]) 02/07/2016  . Erectile dysfunction 02/07/2016  . Muscle wasting and atrophy, not elsewhere classified, unspecified lower leg 01/21/2016  . Left leg weakness 01/21/2016  . Essential hypertension 01/19/2016  . Cataracts, bilateral 01/19/2016  . Left knee pain 01/19/2016   Tilman Neat, SPT This entire session was performed under direct supervision and direction of a licensed therapist/therapist assistant . I have personally read, edited and approve of the note as written.   Trotter,Margaret PT, DPT 05/10/2016, 3:58 PM  [place] MAIN [facility] SERVICES [address] [place], [place], [address] Phone: [phone]   Fax:  [phone]  Name: Michael Montgomery MRN: [id number] Date of Birth: 19-Dec-1960

## 2016-05-12 ENCOUNTER — Ambulatory Visit: Payer: BLUE CROSS/BLUE SHIELD | Admitting: Physical Therapy

## 2016-05-17 ENCOUNTER — Ambulatory Visit: Payer: BLUE CROSS/BLUE SHIELD | Admitting: Physical Therapy

## 2016-05-17 ENCOUNTER — Encounter: Payer: Self-pay | Admitting: Physical Therapy

## 2016-05-17 DIAGNOSIS — M6281 Muscle weakness (generalized): Secondary | ICD-10-CM

## 2016-05-17 DIAGNOSIS — M79605 Pain in left leg: Secondary | ICD-10-CM

## 2016-05-17 DIAGNOSIS — R2681 Unsteadiness on feet: Secondary | ICD-10-CM

## 2016-05-17 NOTE — Patient Instructions (Addendum)
   EXTENSION: Standing (Active)    Stand, both feet flat. Draw right leg behind body as far as possible. Complete _2__ sets of __10_ repetitions. Perform __1_ sessions per day.  http://gtsc.exer.us/76   Copyright  VHI. All rights reserved.  FLEXION: Standing - Stable (Active)    Stand, both feet flat. Lift right knee toward ceiling. Complete _2__ sets of _10__ repetitions. Perform _1__ sessions per day.  http://gtsc.exer.us/28   Copyright  VHI. All rights reserved.  ABDUCTION: Standing (Active)    Stand, feet flat. Lift right leg out to side.  Complete _2__ sets of __10_ repetitions. Perform _1__ sessions per day.  http://gtsc.exer.us/110   Copyright  VHI. All rights reserved.

## 2016-05-17 NOTE — Therapy (Signed)
[place] MAIN [facility] SERVICES [address] [place], [place], [address] Phone: [phone]   Fax:  [phone]  Physical Therapy Treatment  Patient Details  Name: Michael Montgomery MRN: [id number] Date of Birth: 10-04-1961 Referring Provider: Dr. Manuella Ghazi  Encounter Date: 05/17/2016      PT End of Session - 05/17/16 1540    Visit Number 6   Number of Visits 13   Date for PT Re-Evaluation 06/02/16   PT Start Time 1448   PT Stop Time 1531   PT Time Calculation (min) 43 min   Equipment Utilized During Treatment Gait belt   Activity Tolerance Patient tolerated treatment well;Patient limited by pain   Behavior During Therapy [facility] for tasks assessed/performed      Past Medical History:  Diagnosis Date  . Cataract march, april   bilateral removed   . Chickenpox   . Diabetes mellitus without complication (HCC)    metformin   . Diabetic amyotrophy [facility])    sees dr at [facility]  . Gastric ulcer   . Heart murmur    When he was younger, no recent issues  . High blood pressure    controlled with meds  . Hypercholesteremia    controlled with medication  . Knee injury    left  . Motion sickness    boats    Past Surgical History:  Procedure Laterality Date  . CATARACT EXTRACTION W/PHACO Right 02/01/2016   Procedure: CATARACT EXTRACTION PHACO AND INTRAOCULAR LENS PLACEMENT ([place]) right;  Surgeon: Ronnell Freshwater, MD;  Location: [place];  Service: Ophthalmology;  Laterality: Right;  DIABETIC - oral meds  . CATARACT EXTRACTION W/PHACO Left 03/07/2016   Procedure: CATARACT EXTRACTION PHACO AND INTRAOCULAR LENS PLACEMENT (I[place]);  Surgeon: Ronnell Freshwater, MD;  Location: [place];  Service: Ophthalmology;  Laterality: Left;  DIABETIC - oral meds   . EYE SURGERY      There were no vitals filed for this visit.      Subjective Assessment - 05/17/16 1451    Subjective Patient reports feeling more pain lately in  his anterior thigh. He notes some soreness in groin and back. He reports doing HEP. Patient reports near falls but not falls. Patient reports difficulty sleeping at night due to pain.    Pertinent History Clinical Impairments affecting Rehab: young, quick onset of muscle weakness, mutliple falls   Limitations Sitting   How long can you sit comfortably? 1 hour   How long can you stand comfortably? N/A, except for safety   How long can you walk comfortably? Able to walk long distances just slowly, will limite himself due to fear of falling   Diagnostic tests Lumbar Puncture shows his protein was found to be elevated at 86; Had EMG studies; Last A1C was 5.8   Patient Stated Goals "strengthen my legs and be able to get around"   Currently in Pain? Yes   Pain Score 8    Pain Location Leg   Pain Orientation Anterior   Pain Descriptors / Indicators Burning   Pain Onset More than a month ago      Treatment:  NuStep, 3 min, BUE/LE, L2 taking history throughout   Supine: SAQ, 3 x10, BLE over red bolster, min VCs to contract quad muscle, count out loud, slight OP at end to ensure full range Hamstring slides, x10, LLE only able to perform with good form and increased control Quad sets, x5, LLE only min VCs to hold  for 3-5 seconds and than relax SLR, 2 x5, LLE only min VCs to maintain knee in extension  Standing: 4 way hip , no ADD, no resistance, 2 sets of 10 reps LLE only min VCs to maintain upright posture, 2 HHA Weight shifts from LLE to RLE, x10, 2 HHA Squats 2 x5, with body split in half to increase WB on LLE, min VCs to distribute weight, blocking of L knee just in case of buckling.  Manual therapy: Manual long axis distraction of LLE, x3 for 30 seconds.                                 PT Education - 05/17/16 1540    Education provided Yes   Education Details HEp progressed, safety   Person(s) Educated Patient   Methods Explanation;Verbal  cues;Demonstration   Comprehension Verbalized understanding;Returned demonstration;Verbal cues required             PT Long Term Goals - 04/21/16 1008      PT LONG TERM GOAL #1   Title Patient will be independent in HEP in order to increase patient's ability to maintain gains achieved in therapy and assist with return to PLOF.    Time 6   Period Weeks   Status New     PT LONG TERM GOAL #2   Title Patient will increase overall strength to 4/5 in LLE in order to complete transfers and ambulation safely with decreased risk for falls.   Time 6   Period Weeks   Status New     PT LONG TERM GOAL #3   Title Patient will decrease the time it takes to perform 5 times sit to stand to <10 seconds without UE use in order to decrease his risk for falls.     Time 6   Period Weeks   Status New     PT LONG TERM GOAL #4   Title Patient will increase his 10 m walk test time to >1.0 m/s in order to be a limited community ambulator at decreased risk for falls.    Time 6   Period Weeks   Status New     PT LONG TERM GOAL #5   Title Patient will report worst pain </= 4 points on the VAS in lower extremity in order to allow patient to perform ADLs and leisure activities with reduced symptoms.    Time 6   Period Weeks   Status New     Additional Long Term Goals   Additional Long Term Goals Yes     PT LONG TERM GOAL #6   Title Patient will increase lower extremity functional scale to >60/80 to demonstrate improved functional mobility and increased tolerance with ADLs.    Time 6   Period Weeks   Status New               Plan - 05/17/16 1542    Clinical Impression Statement Patient has increase LE pain that he states has been over the past 3 days. Pain is increased with standing squats with increased WB on LLE with LLE knee in slight flexion. This was discontinued due to the increase in pain. Patient required min VCs during therex in order to perform with appropriate form, but he is  not limited by pain. Patient responded well to LLE long distraction with decrease to 6/10 pain. Patient would continue to benefit from skilled PT in order  to increase LLE weakness, safety, balance, and independence in movements.    Rehab Potential Good   Clinical Impairments Affecting Rehab Potential Postive Factors: young, motivated; Negative Factors: falls, symptoms since January, extreme muscle weakness; Clinical Presentation: evolving due to possible progression of symptoms, extreme muscle weakness, and multiple falls   PT Frequency 2x / week   PT Duration 6 weeks   PT Treatment/Interventions ADLs/Self Care Home Management;Aquatic Therapy;Biofeedback;Cryotherapy;Electrical Stimulation;Moist Heat;Traction;DME Instruction;Gait training;Stair training;Functional mobility training;Therapeutic activities;Therapeutic exercise;Balance training;Manual techniques;Patient/family education;Neuromuscular re-education;Passive range of motion;Energy conservation   PT Next Visit Plan Progress HEP, balance activities, progress CKC LE strengthening ensuring LLE involvment, progress leg press, SLR   PT Home Exercise Plan continue HEP as given   Consulted and Agree with Plan of Care Patient      Patient will benefit from skilled therapeutic intervention in order to improve the following deficits and impairments:  Abnormal gait, Decreased activity tolerance, Decreased balance, Decreased mobility, Decreased range of motion, Decreased strength, Impaired flexibility, Postural dysfunction, Improper body mechanics, Impaired sensation, Pain, Decreased safety awareness, Decreased knowledge of use of DME, Decreased endurance  Visit Diagnosis: Muscle weakness (generalized)  Unsteadiness on feet  Pain In Left Leg     Problem List Patient Active Problem List   Diagnosis Date Noted  . Sleeping difficulty 04/21/2016  . CIDP (chronic inflammatory demyelinating polyneuropathy) ([place]) 04/11/2016  . Erectile dysfunction  of organic origin 03/17/2016  . Phimosis 03/17/2016  . BPH with obstruction/lower urinary tract symptoms 03/17/2016  . Gingivitis 03/01/2016  . Diabetes ([place]) 02/07/2016  . Erectile dysfunction 02/07/2016  . Muscle wasting and atrophy, not elsewhere classified, unspecified lower leg 01/21/2016  . Left leg weakness 01/21/2016  . Essential hypertension 01/19/2016  . Cataracts, bilateral 01/19/2016  . Left knee pain 01/19/2016   Tilman Neat, SPT This entire session was performed under direct supervision and direction of a licensed therapist/therapist assistant . I have personally read, edited and approve of the note as written.  Trotter,Margaret PT, DPT 05/18/2016, 9:50 AM  [place] MAIN [facility] SERVICES [address] [place], [place], [address] Phone: [phone]   Fax:  [phone]  Name: Michael Montgomery MRN: [id number] Date of Birth: 1961/01/31

## 2016-05-19 ENCOUNTER — Ambulatory Visit: Payer: BLUE CROSS/BLUE SHIELD | Admitting: Physical Therapy

## 2016-05-19 ENCOUNTER — Encounter: Payer: Self-pay | Admitting: Physical Therapy

## 2016-05-19 DIAGNOSIS — M6281 Muscle weakness (generalized): Secondary | ICD-10-CM

## 2016-05-19 DIAGNOSIS — M79605 Pain in left leg: Secondary | ICD-10-CM

## 2016-05-19 DIAGNOSIS — R2681 Unsteadiness on feet: Secondary | ICD-10-CM

## 2016-05-19 NOTE — Therapy (Signed)
[place] MAIN [facility] SERVICES [address] [place], [place], [address] Phone: [phone]   Fax:  [phone]  Physical Therapy Treatment  Patient Details  Name: Michael Montgomery MRN: [id number] Date of Birth: 07/21/1961 Referring Provider: Dr. Manuella Ghazi  Encounter Date: 05/19/2016      PT End of Session - 05/19/16 1523    Visit Number 7   Number of Visits 13   Date for PT Re-Evaluation 06/02/16   PT Start Time 1440   PT Stop Time 1518   PT Time Calculation (min) 38 min   Equipment Utilized During Treatment Gait belt   Activity Tolerance Patient tolerated treatment well   Behavior During Therapy [facility] for tasks assessed/performed      Past Medical History:  Diagnosis Date  . Cataract march, april   bilateral removed   . Chickenpox   . Diabetes mellitus without complication (HCC)    metformin   . Diabetic amyotrophy [facility])    sees dr at [facility]  . Gastric ulcer   . Heart murmur    When he was younger, no recent issues  . High blood pressure    controlled with meds  . Hypercholesteremia    controlled with medication  . Knee injury    left  . Motion sickness    boats    Past Surgical History:  Procedure Laterality Date  . CATARACT EXTRACTION W/PHACO Right 02/01/2016   Procedure: CATARACT EXTRACTION PHACO AND INTRAOCULAR LENS PLACEMENT ([place]) right;  Surgeon: Ronnell Freshwater, MD;  Location: [place];  Service: Ophthalmology;  Laterality: Right;  DIABETIC - oral meds  . CATARACT EXTRACTION W/PHACO Left 03/07/2016   Procedure: CATARACT EXTRACTION PHACO AND INTRAOCULAR LENS PLACEMENT (IOC);  Surgeon: Ronnell Freshwater, MD;  Location: [place];  Service: Ophthalmology;  Laterality: Left;  DIABETIC - oral meds   . EYE SURGERY      There were no vitals filed for this visit.      Subjective Assessment - 05/19/16 1443    Subjective Patient reports doing HEP and doing well since last session. Patient  reports no falls.    Pertinent History Clinical Impairments affecting Rehab: young, quick onset of muscle weakness, mutliple falls   Limitations Sitting   How long can you sit comfortably? 1 hour   How long can you stand comfortably? N/A, except for safety   How long can you walk comfortably? Able to walk long distances just slowly, will limite himself due to fear of falling   Diagnostic tests Lumbar Puncture shows his protein was found to be elevated at 86; Had EMG studies; Last A1C was 5.8   Patient Stated Goals "strengthen my legs and be able to get around"   Currently in Pain? Yes   Pain Score 7    Pain Location Leg   Pain Orientation Left;Anterior   Pain Descriptors / Indicators Sore   Pain Onset More than a month ago     Treatment:  NuStep, BLE only, L0, x 2.5 minutes, taking history throughout  Manual therapy: Manual long distraction of LLE, 3 x15 seconds,   TherEx:  Supine: SAQ, BLE, 2# pound weight on LLE, 3 x10, min VCs to hold in knee extension for 1 second SLR, LLE only, 2x5, min VCs to hold knee in extension throughout movement and to increase range Glut squeezes, 2 x10, min VCs on how to perform initially Standing: Hamstring curls, 2 x10, LLE only, difficult due to quad tightness Hip  ABD and extension, LLE only, x10 each direction  Assisted quad stretch, BLE, 2 x30 seconds each LE; initiated in HEP.                           PT Education - 05/19/16 1523    Education provided Yes   Education Details HEP progressed   Person(s) Educated Patient   Methods Explanation;Verbal cues;Demonstration   Comprehension Verbalized understanding;Verbal cues required;Returned demonstration             PT Long Term Goals - 04/21/16 1008      PT LONG TERM GOAL #1   Title Patient will be independent in HEP in order to increase patient's ability to maintain gains achieved in therapy and assist with return to PLOF.    Time 6   Period Weeks   Status  New     PT LONG TERM GOAL #2   Title Patient will increase overall strength to 4/5 in LLE in order to complete transfers and ambulation safely with decreased risk for falls.   Time 6   Period Weeks   Status New     PT LONG TERM GOAL #3   Title Patient will decrease the time it takes to perform 5 times sit to stand to <10 seconds without UE use in order to decrease his risk for falls.     Time 6   Period Weeks   Status New     PT LONG TERM GOAL #4   Title Patient will increase his 10 m walk test time to >1.0 m/s in order to be a limited community ambulator at decreased risk for falls.    Time 6   Period Weeks   Status New     PT LONG TERM GOAL #5   Title Patient will report worst pain </= 4 points on the VAS in lower extremity in order to allow patient to perform ADLs and leisure activities with reduced symptoms.    Time 6   Period Weeks   Status New     Additional Long Term Goals   Additional Long Term Goals Yes     PT LONG TERM GOAL #6   Title Patient will increase lower extremity functional scale to >60/80 to demonstrate improved functional mobility and increased tolerance with ADLs.    Time 6   Period Weeks   Status New               Plan - 05/19/16 1524    Clinical Impression Statement Patient arrived to appointment 10 minutes late. Patient continues to have increase LLE anterior thigh pain. Due to pain, patient performed predominantly NWB exercises today. Patient's exercises were progressed with increased weight during SAQ. Patient instructed on prone quad stretching with sheet, requiring min VCs to not push into pain. Patient would continue to benefit from skilled PT in order to address LLE weakness, safety with mobility, and improve balance.    Rehab Potential Good   Clinical Impairments Affecting Rehab Potential Postive Factors: young, motivated; Negative Factors: falls, symptoms since January, extreme muscle weakness; Clinical Presentation: evolving due to  possible progression of symptoms, extreme muscle weakness, and multiple falls   PT Frequency 2x / week   PT Duration 6 weeks   PT Treatment/Interventions ADLs/Self Care Home Management;Aquatic Therapy;Biofeedback;Cryotherapy;Electrical Stimulation;Moist Heat;Traction;DME Instruction;Gait training;Stair training;Functional mobility training;Therapeutic activities;Therapeutic exercise;Balance training;Manual techniques;Patient/family education;Neuromuscular re-education;Passive range of motion;Energy conservation   PT Next Visit Plan Progress HEP, balance activities, progress  CKC LE strengthening ensuring LLE involvment, progress leg press, SLR   PT Home Exercise Plan HEP progressed   Consulted and Agree with Plan of Care Patient      Patient will benefit from skilled therapeutic intervention in order to improve the following deficits and impairments:  Abnormal gait, Decreased activity tolerance, Decreased balance, Decreased mobility, Decreased range of motion, Decreased strength, Impaired flexibility, Postural dysfunction, Improper body mechanics, Impaired sensation, Pain, Decreased safety awareness, Decreased knowledge of use of DME, Decreased endurance  Visit Diagnosis: Muscle weakness (generalized)  Unsteadiness on feet  Pain In Left Leg     Problem List Patient Active Problem List   Diagnosis Date Noted  . Sleeping difficulty 04/21/2016  . CIDP (chronic inflammatory demyelinating polyneuropathy) ([place]) 04/11/2016  . Erectile dysfunction of organic origin 03/17/2016  . Phimosis 03/17/2016  . BPH with obstruction/lower urinary tract symptoms 03/17/2016  . Gingivitis 03/01/2016  . Diabetes ([place]) 02/07/2016  . Erectile dysfunction 02/07/2016  . Muscle wasting and atrophy, not elsewhere classified, unspecified lower leg 01/21/2016  . Left leg weakness 01/21/2016  . Essential hypertension 01/19/2016  . Cataracts, bilateral 01/19/2016  . Left knee pain 01/19/2016   Tilman Neat,  SPT This entire session was performed under direct supervision and direction of a licensed therapist/therapist assistant . I have personally read, edited and approve of the note as written.  Trotter,Margaret PT, DPT 05/20/2016, 8:09 AM  [employer] MAIN [facility] SERVICES [address] [place], [place], [address] Phone: [phone]   Fax:  [phone]  Name: DARREK ROHLIK MRN: [id number] Date of Birth: 1960/12/26

## 2016-05-19 NOTE — Patient Instructions (Addendum)
  KNEE: Quadriceps - Prone    Place strap around ankle. Bring ankle toward buttocks. Press hip into surface. Hold _30_ seconds. __2_ reps per set, _2__ sets per day, __5_ days per week   Copyright  VHI. All rights reserved.

## 2016-05-24 ENCOUNTER — Encounter: Payer: Self-pay | Admitting: Physical Therapy

## 2016-05-24 ENCOUNTER — Ambulatory Visit: Payer: Medicaid Other | Attending: Neurology | Admitting: Physical Therapy

## 2016-05-24 ENCOUNTER — Encounter: Payer: Self-pay | Admitting: Family Medicine

## 2016-05-24 ENCOUNTER — Ambulatory Visit (INDEPENDENT_AMBULATORY_CARE_PROVIDER_SITE_OTHER): Payer: Medicaid Other | Admitting: Family Medicine

## 2016-05-24 DIAGNOSIS — E785 Hyperlipidemia, unspecified: Secondary | ICD-10-CM | POA: Diagnosis not present

## 2016-05-24 DIAGNOSIS — M79605 Pain in left leg: Secondary | ICD-10-CM | POA: Insufficient documentation

## 2016-05-24 DIAGNOSIS — G6181 Chronic inflammatory demyelinating polyneuritis: Secondary | ICD-10-CM

## 2016-05-24 DIAGNOSIS — M6281 Muscle weakness (generalized): Secondary | ICD-10-CM | POA: Insufficient documentation

## 2016-05-24 DIAGNOSIS — R2681 Unsteadiness on feet: Secondary | ICD-10-CM | POA: Diagnosis present

## 2016-05-24 DIAGNOSIS — I10 Essential (primary) hypertension: Secondary | ICD-10-CM

## 2016-05-24 DIAGNOSIS — R195 Other fecal abnormalities: Secondary | ICD-10-CM

## 2016-05-24 DIAGNOSIS — G479 Sleep disorder, unspecified: Secondary | ICD-10-CM | POA: Diagnosis not present

## 2016-05-24 MED ORDER — LISINOPRIL 5 MG PO TABS: 2.5000 mg | ORAL_TABLET | Freq: Every day | ORAL | 3 refills | Status: DC

## 2016-05-24 MED ORDER — ZOLPIDEM TARTRATE ER 6.25 MG PO TBCR: 6.2500 mg | EXTENDED_RELEASE_TABLET | Freq: Every evening | ORAL | 0 refills | Status: DC | PRN

## 2016-05-24 NOTE — Assessment & Plan Note (Signed)
Patient with some loose stools since starting on metformin. Notes this does not bother him too much. Discussed that we could change to the XR version to see if this would be beneficial. He wants to continue his current dosing until he comes close to finishing his current prescription and then will send in for metformin XR.

## 2016-05-24 NOTE — Progress Notes (Signed)
[name], MD Phone: [phone]  Michael Montgomery is a 55 y.o. male who presents today for follow-up.  Sleep issues: Patient notes this is not improved. Had been taking the Ambien and still had a hard time with sleep. Goes to bed at 10-11 PM takes 1-2 hours to fall sleep and sleeps for 3 hours and then is awake. No alcohol or caffeine intake. Minimal anxiety. No depression.  CIDP: Has not followed up with neurology at. Has been going to physical therapy. Notes this does help. Notes left leg is becoming stronger with the exception of side-to-side movements. Notes overall is stable. Occasionally his left leg will be sore though no soreness elsewhere. His disability paperwork is in process.  HYPERTENSION Disease Monitoring: Blood pressure range-not checking Chest pain- no      Dyspnea- no Medications: Compliance- taking amlodipine and lisinopril, notes he lost his lisinopril 1-2 days ago. Edema- no  HYPERLIPIDEMIA Disease Monitoring: See symptoms for Hypertension Medications: Compliance- taking Lipitor Right upper quadrant pain- no  Muscle aches- no  Patient additionally notes some mild diarrhea since starting the metformin. Notes his stool is looser than typical. No discomfort with this. No blood in his stool. Does note passing more gas.   PMH: Smoker   ROS see history of present illness  Objective  Physical Exam Vitals:   05/24/16 0926  BP: 118/66  Pulse: 90  Temp: 98.8 F (37.1 C)    BP Readings from Last 3 Encounters:  05/24/16 118/66  04/29/16 130/84  04/21/16 136/74   Wt Readings from Last 3 Encounters:  05/24/16 177 lb 9.6 oz (80.6 kg)  04/29/16 170 lb 11.2 oz (77.4 kg)  04/21/16 172 lb (78 kg)    Physical Exam  Constitutional: No distress.  HENT:  Head: Normocephalic and atraumatic.  Cardiovascular: Normal rate and regular rhythm.   Pulmonary/Chest: Effort normal and breath sounds normal.  Musculoskeletal: He exhibits no edema.  Atrophy of  left leg noted  Neurological: He is alert.  CN 2-12 intact, 4/5 strength in left quad, hamstrings, plantar flexion, and dorsiflexion, otherwise 5/5 strength in bilateral biceps, triceps, grip, and right quad, hamstring, plantar and dorsiflexion, sensation to light touch intact in bilateral UE and LE, gait favors left leg, 2+ patellar reflexes  Skin: Skin is warm and dry. He is not diaphoretic.     Assessment/Plan: Please see individual problem list.  Essential hypertension At goal. Continue lisinopril and amlodipine. Refill lisinopril today.  CIDP (chronic inflammatory demyelinating polyneuropathy) (HCC) Seems to be slightly improved. Encouraged follow-up with neurology. Continue physical therapy. Await disability decision. If new or progressive weakness he will seek medical attention.  Sleeping difficulty Ambien was not that beneficial. We will discontinue this and he will start on Ambien CR to see if this will be beneficial.  Hyperlipidemia Tolerating Lipitor. Continue Lipitor.  Loose stools Patient with some loose stools since starting on metformin. Notes this does not bother him too much. Discussed that we could change to the XR version to see if this would be beneficial. He wants to continue his current dosing until he comes close to finishing his current prescription and then will send in for metformin XR.   No orders of the defined types were placed in this encounter.   Meds ordered this encounter  Medications  . lisinopril (PRINIVIL,ZESTRIL) 5 MG tablet    Sig: Take 0.5 tablets (2.5 mg total) by mouth daily.    Dispense:  45 tablet    Refill:  3  .  zolpidem (AMBIEN CR) 6.25 MG CR tablet    Sig: Take 1 tablet (6.25 mg total) by mouth at bedtime as needed for sleep.    Dispense:  30 tablet    Refill:  0   [name], MD [place]

## 2016-05-24 NOTE — Patient Instructions (Addendum)
        Extension: Lumbar - Prone    Lie with arms bent, hands beside shoulders. Press down with hands, arching back. Keep pelvis on floor. Repeat _5_ times per set. Do anytime you start feeling pain. Do _multiple_ sessions per week.    Copyright  VHI. All rights reserved.  Standing Arch (Extension)    Place hands in small of back. Using hands as fulcrum, arch backward. Try to keep knees straight. Great exercise if sitting makes pain worse. Use to break up long periods of sitting. Repeat _5_ times. Do __2__ sessions per day.  http://gt2.exer.us/247   Copyright  VHI. All rights reserved.

## 2016-05-24 NOTE — Assessment & Plan Note (Addendum)
Seems to be slightly improved. Encouraged follow-up with neurology. Continue physical therapy. Await disability decision. If new or progressive weakness he will seek medical attention.

## 2016-05-24 NOTE — Assessment & Plan Note (Signed)
At goal. Continue lisinopril and amlodipine. Refill lisinopril today.

## 2016-05-24 NOTE — Progress Notes (Signed)
Pre visit review using our clinic review tool, if applicable. No additional management support is needed unless otherwise documented below in the visit note. 

## 2016-05-24 NOTE — Therapy (Signed)
[place] MAIN [facility] SERVICES [address] [place], [place], [address] Phone: [phone]   Fax:  [phone]  Physical Therapy Treatment  Patient Details  Name: Michael Montgomery MRN: [id number] Date of Birth: [birth year] Referring Provider: Dr. Manuella Ghazi  Encounter Date: 05/24/2016      PT End of Session - 05/24/16 1623    Visit Number 8   Number of Visits 13   Date for PT Re-Evaluation 06/02/16   PT Start Time 1518   PT Stop Time 1600   PT Time Calculation (min) 42 min   Equipment Utilized During Treatment Gait belt   Activity Tolerance Patient tolerated treatment well   Behavior During Therapy [facility] for tasks assessed/performed      Past Medical History:  Diagnosis Date  . Cataract march, april   bilateral removed   . Chickenpox   . Diabetes mellitus without complication (HCC)    metformin   . Diabetic amyotrophy [facility])    sees dr at [facility]  . Gastric ulcer   . Heart murmur    When he was younger, no recent issues  . High blood pressure    controlled with meds  . Hypercholesteremia    controlled with medication  . Knee injury    left  . Motion sickness    boats    Past Surgical History:  Procedure Laterality Date  . CATARACT EXTRACTION W/PHACO Right 02/01/2016   Procedure: CATARACT EXTRACTION PHACO AND INTRAOCULAR LENS PLACEMENT ([place]) right;  Surgeon: Ronnell Freshwater, MD;  Location: [place];  Service: Ophthalmology;  Laterality: Right;  DIABETIC - oral meds  . CATARACT EXTRACTION W/PHACO Left 03/07/2016   Procedure: CATARACT EXTRACTION PHACO AND INTRAOCULAR LENS PLACEMENT ([facility]);  Surgeon: Ronnell Freshwater, MD;  Location: [place];  Service: Ophthalmology;  Laterality: Left;  DIABETIC - oral meds   . EYE SURGERY      There were no vitals filed for this visit.      Subjective Assessment - 05/24/16 1521    Subjective Patient reports he had a MD visit this am. Patient's MD who said that  the patient feels like he is becoming a little stronger and to continue doing what he has been doing. He states he is doing HEP. Patient reports no falls.    Pertinent History Clinical Impairments affecting Rehab: young, quick onset of muscle weakness, mutliple falls   Limitations Sitting   How long can you sit comfortably? 1 hour   How long can you stand comfortably? N/A, except for safety   How long can you walk comfortably? Able to walk long distances just slowly, will limite himself due to fear of falling   Diagnostic tests Lumbar Puncture shows his protein was found to be elevated at 86; Had EMG studies; Last A1C was 5.8   Patient Stated Goals "strengthen my legs and be able to get around"   Currently in Pain? Yes   Pain Score 6    Pain Location Leg   Pain Orientation Left   Pain Descriptors / Indicators Sore   Pain Onset More than a month ago      Treatment:  NuStep, 3 min. LE only, L2, history taken throughout warm up.  Supine: SAQ, LLE only 3# ankle weight, 2 x10, min VCs to move through complete range into knee extension and control decent  SLR, LLE only, 3 x5, min VCs to maintain knee extension throughout the range  Prone active assistive quad stretching, 2  x20 seconds each side, min VCs to lay in a full prone position before beginning stretch  Initiated to HEP: Prone on elbows, x2 minutes, relieves all symptoms Prone press ups, 2x5, again patient notes relief, min VCs on how to perform Instructed to decrease amount of time holding in a push up position (top of the push up) to relieve arms. Standing lumbar extension with hand on wall, 2x5, min VCs to return to neutral not lumbar flexion  BLE leg press, 90#, 2 x10, min VCs to perform without full knee extension, mild increase in LBP   Standing hip abduction and extension against red tband, 2 x10, LLE only, min VCs to maintain trunk in neutral and not compensation during hip extension.   Ended session with patient laying  prone on elbows, x 1-2 minutes, pain decreased to 3/10.                                 PT Education - 05/24/16 1622    Education provided Yes   Education Details HEP progressed, relieving movements for pain   Person(s) Educated Patient   Methods Explanation;Demonstration;Verbal cues   Comprehension Verbal cues required;Verbalized understanding;Returned demonstration             PT Long Term Goals - 04/21/16 1008      PT LONG TERM GOAL #1   Title Patient will be independent in HEP in order to increase patient's ability to maintain gains achieved in therapy and assist with return to PLOF.    Time 6   Period Weeks   Status New     PT LONG TERM GOAL #2   Title Patient will increase overall strength to 4/5 in LLE in order to complete transfers and ambulation safely with decreased risk for falls.   Time 6   Period Weeks   Status New     PT LONG TERM GOAL #3   Title Patient will decrease the time it takes to perform 5 times sit to stand to <10 seconds without UE use in order to decrease his risk for falls.     Time 6   Period Weeks   Status New     PT LONG TERM GOAL #4   Title Patient will increase his 10 m walk test time to >1.0 m/s in order to be a limited community ambulator at decreased risk for falls.    Time 6   Period Weeks   Status New     PT LONG TERM GOAL #5   Title Patient will report worst pain </= 4 points on the VAS in lower extremity in order to allow patient to perform ADLs and leisure activities with reduced symptoms.    Time 6   Period Weeks   Status New     Additional Long Term Goals   Additional Long Term Goals Yes     PT LONG TERM GOAL #6   Title Patient will increase lower extremity functional scale to >60/80 to demonstrate improved functional mobility and increased tolerance with ADLs.    Time 6   Period Weeks   Status New               Plan - 05/24/16 1623    Clinical Impression Statement Patient arrived  to PT with less pain than previous week. Patient is able to participate in PT reporting that exercises are difficult but not painful. Patient has good pain relief  with extension based exercises and manual therapy. At the beginning of the session, patient's pain was 6/10, decreased to 4/10 with manual therapy, in prone on elbows and prone push ups pain decreased to 0/10. Increased with ambulation and LE strengthening but relieved to 3/10 pain by the end of session. Instructed to perform these activities at hom. Patient would continue to benefit from skilled PT in order to address LE strength, balance, safety with mobility, and relieve pain.    Rehab Potential Good   Clinical Impairments Affecting Rehab Potential Postive Factors: young, motivated; Negative Factors: falls, symptoms since January, extreme muscle weakness; Clinical Presentation: evolving due to possible progression of symptoms, extreme muscle weakness, and multiple falls   PT Frequency 2x / week   PT Duration 6 weeks   PT Treatment/Interventions ADLs/Self Care Home Management;Aquatic Therapy;Biofeedback;Cryotherapy;Electrical Stimulation;Moist Heat;Traction;DME Instruction;Gait training;Stair training;Functional mobility training;Therapeutic activities;Therapeutic exercise;Balance training;Manual techniques;Patient/family education;Neuromuscular re-education;Passive range of motion;Energy conservation   PT Next Visit Plan Progress HEP, balance activities, progress CKC LE strengthening ensuring LLE involvment, progress leg press, SLR   PT Home Exercise Plan HEP progressed   Consulted and Agree with Plan of Care Patient      Patient will benefit from skilled therapeutic intervention in order to improve the following deficits and impairments:  Abnormal gait, Decreased activity tolerance, Decreased balance, Decreased mobility, Decreased range of motion, Decreased strength, Impaired flexibility, Postural dysfunction, Improper body mechanics,  Impaired sensation, Pain, Decreased safety awareness, Decreased knowledge of use of DME, Decreased endurance  Visit Diagnosis: Muscle weakness (generalized)  Unsteadiness on feet  Pain In Left Leg     Problem List Patient Active Problem List   Diagnosis Date Noted  . Hyperlipidemia 05/24/2016  . Loose stools 05/24/2016  . Sleeping difficulty 04/21/2016  . CIDP (chronic inflammatory demyelinating polyneuropathy) ([place]) 04/11/2016  . Erectile dysfunction of organic origin 03/17/2016  . Phimosis 03/17/2016  . BPH with obstruction/lower urinary tract symptoms 03/17/2016  . Gingivitis 03/01/2016  . Diabetes ([place]) 02/07/2016  . Erectile dysfunction 02/07/2016  . Muscle wasting and atrophy, not elsewhere classified, unspecified lower leg 01/21/2016  . Left leg weakness 01/21/2016  . Essential hypertension 01/19/2016  . Cataracts, bilateral 01/19/2016  . Left knee pain 01/19/2016   Tilman Neat, SPT This entire session was performed under direct supervision and direction of a licensed therapist/therapist assistant . I have personally read, edited and approve of the note as written.  Trotter,Margaret PT, DPT 05/24/2016, 5:17 PM  [place] MAIN [facility] SERVICES [address] [place], [place], [address] Phone: [phone]   Fax:  [phone]  Name: Michael Montgomery MRN: [id number] Date of Birth: [birth year]

## 2016-05-24 NOTE — Assessment & Plan Note (Signed)
Tolerating Lipitor. Continue Lipitor. 

## 2016-05-24 NOTE — Assessment & Plan Note (Signed)
Ambien was not that beneficial. We will discontinue this and he will start on Ambien CR to see if this will be beneficial.

## 2016-05-24 NOTE — Patient Instructions (Signed)
Nice to see you. We will try you on Ambien CR to see if this is more beneficial. Please contact your neurologist office to see when your appointment is. Please continue your blood pressure medications and cholesterol medications. When you're about 2 weeks from finishing your metformin please call us and we can change you to the extended release version if you would like.  If you develop any worsening weakness please seek medical attention.

## 2016-05-26 ENCOUNTER — Ambulatory Visit: Payer: Medicaid Other | Admitting: Physical Therapy

## 2016-05-27 ENCOUNTER — Other Ambulatory Visit: Payer: Self-pay

## 2016-05-27 DIAGNOSIS — E291 Testicular hypofunction: Secondary | ICD-10-CM

## 2016-05-30 ENCOUNTER — Other Ambulatory Visit: Payer: BLUE CROSS/BLUE SHIELD

## 2016-05-30 DIAGNOSIS — E291 Testicular hypofunction: Secondary | ICD-10-CM

## 2016-05-31 ENCOUNTER — Ambulatory Visit: Payer: Medicaid Other | Admitting: Physical Therapy

## 2016-05-31 ENCOUNTER — Telehealth: Payer: Self-pay

## 2016-05-31 DIAGNOSIS — E291 Testicular hypofunction: Secondary | ICD-10-CM

## 2016-05-31 DIAGNOSIS — Z79899 Other long term (current) drug therapy: Secondary | ICD-10-CM

## 2016-05-31 LAB — TESTOSTERONE: Testosterone: 802 ng/dL (ref 264–916)

## 2016-05-31 NOTE — Telephone Encounter (Signed)
Spoke with pt in reference to testosterone results. Pt voiced understanding. Pt was transferred to the front to make f/u appt and trimix appt. Orders placed.

## 2016-05-31 NOTE — Telephone Encounter (Signed)
-----   Message from Nori Riis, PA-C sent at 05/31/2016  8:33 AM EDT ----- Please notify the patient that his testosterone is at a therapeutic level.  I will need to see him in 6 months.  I will need a HCT, morning testosterone (before 10 AM), PSA and LFT's prior to that visit.

## 2016-06-01 ENCOUNTER — Encounter: Payer: Self-pay | Admitting: Urology

## 2016-06-01 ENCOUNTER — Ambulatory Visit (INDEPENDENT_AMBULATORY_CARE_PROVIDER_SITE_OTHER): Payer: BLUE CROSS/BLUE SHIELD | Admitting: Urology

## 2016-06-01 VITALS — BP 128/83 | HR 93 | Ht 70.0 in | Wt 174.9 lb

## 2016-06-01 DIAGNOSIS — E291 Testicular hypofunction: Secondary | ICD-10-CM

## 2016-06-01 DIAGNOSIS — N528 Other male erectile dysfunction: Secondary | ICD-10-CM | POA: Diagnosis not present

## 2016-06-01 DIAGNOSIS — N471 Phimosis: Secondary | ICD-10-CM | POA: Diagnosis not present

## 2016-06-01 DIAGNOSIS — N529 Male erectile dysfunction, unspecified: Secondary | ICD-10-CM

## 2016-06-01 NOTE — Progress Notes (Signed)
8:37 PM   ELRY SLUYTER Feb 23, 1961 [id number]  Referring provider: Leone Haven, MD [address] [place], [place] [address]  Chief Complaint  Patient presents with  . Erectile Dysfunction    Trimix injection    HPI: Patient is a 55 year old diabetic AAM who presents today for a trial of Trimix for erectile dysfunction.  He also has hypogonadism that is being treated with Clomid.       Erectile dysfunction His SHIM score is 7, which is severe erectile dysfunction.   He has been having difficulty with erections for 1 year.   His major complaint is achieving and maintaining an erection.  His libido is diminished.   His risk factors for ED are age, BPH, smoking, diabetes, HTN and HLD.  He denies any painful erections or curvatures with his erections.   He is no longer having spontaneous erections.   He has tried Viagra and Cialis in the past without success.        SHIM    Row Name 04/29/16 [phone]         SHIM: Over the last 6 months:   How do you rate your confidence that you could get and keep an erection? Very Low     When you had erections with sexual stimulation, how often were your erections hard enough for penetration (entering your partner)? Almost Never or Never     During sexual intercourse, how often were you able to maintain your erection after you had penetrated (entered) your partner? Extremely Difficult     During sexual intercourse, how difficult was it to maintain your erection to completion of intercourse? Very Difficult     When you attempted sexual intercourse, how often was it satisfactory for you? Very Difficult       SHIM Total Score   SHIM 7        Score: 1-7 Severe ED 8-11 Moderate ED 12-16 Mild-Moderate ED 17-21 Mild ED 22-25 No ED  Patient is experiencing a decrease in libido and erections being less strong.  Patient's testosterone was found to be 298.54 ng/dL on 02/04/2016 and 314 ng/dL on 03/17/2016.  His LFT's are  normal.  He was initiated on Clomid therapy and his most recent morning testosterone was 802 ng/dL on 05/30/2016.       PMH: Past Medical History:  Diagnosis Date  . Cataract march, april   bilateral removed   . Chickenpox   . Diabetes mellitus without complication (HCC)    metformin   . Diabetic amyotrophy [facility])    sees dr at [facility]  . Gastric ulcer   . Heart murmur    When he was younger, no recent issues  . High blood pressure    controlled with meds  . Hypercholesteremia    controlled with medication  . Knee injury    left  . Motion sickness    boats    Surgical History: Past Surgical History:  Procedure Laterality Date  . CATARACT EXTRACTION W/PHACO Right 02/01/2016   Procedure: CATARACT EXTRACTION PHACO AND INTRAOCULAR LENS PLACEMENT ([place]) right;  Surgeon: Ronnell Freshwater, MD;  Location: [place];  Service: Ophthalmology;  Laterality: Right;  DIABETIC - oral meds  . CATARACT EXTRACTION W/PHACO Left 03/07/2016   Procedure: CATARACT EXTRACTION PHACO AND INTRAOCULAR LENS PLACEMENT ([facility]);  Surgeon: Ronnell Freshwater, MD;  Location: [place];  Service: Ophthalmology;  Laterality: Left;  DIABETIC - oral meds   . EYE SURGERY  Home Medications:    Medication List       Accurate as of 06/01/16 11:59 PM. Always use your most recent med list.          amLODipine 10 MG tablet Commonly known as:  NORVASC Take 1 tablet (10 mg total) by mouth daily.   atorvastatin 40 MG tablet Commonly known as:  LIPITOR Take 1 tablet (40 mg total) by mouth daily.   clomiPHENE 50 MG tablet Commonly known as:  CLOMID Take 1/2 tablet daily   gabapentin 300 MG capsule Commonly known as:  NEURONTIN Take by mouth 3 (three) times daily. Reported on 03/15/2016   gabapentin 600 MG tablet Commonly known as:  NEURONTIN   lisinopril 5 MG tablet Commonly known as:  PRINIVIL,ZESTRIL Take 0.5 tablets (2.5 mg total) by mouth daily.   metFORMIN 500 MG  tablet Commonly known as:  GLUCOPHAGE Please take 1000 mg (2 tablets) in the morning and 500 mg (1 tablet) at night by mouth   multivitamin capsule Take 1 capsule by mouth daily.   nystatin-triamcinolone ointment Commonly known as:  MYCOLOG Apply 1 application topically 2 (two) times daily.   terbinafine 250 MG tablet Commonly known as:  LAMISIL Take 1 tablet (250 mg total) by mouth daily.   VIAGRA 50 MG tablet Generic drug:  sildenafil Reported on 04/29/2016   zolpidem 6.25 MG CR tablet Commonly known as:  AMBIEN CR Take 1 tablet (6.25 mg total) by mouth at bedtime as needed for sleep.       Allergies: No Known Allergies  Family History: Family History  Problem Relation Age of Onset  . Alcoholism Father   . Hyperlipidemia Father   . Hypertension Father   . Throat cancer Father     smoked but had stopped 20 yeras before dx  . Diabetes Mother   . Kidney disease Neg Hx   . Prostate cancer Neg Hx   . Colon cancer Neg Hx   . Colon polyps Neg Hx   . Esophageal cancer Neg Hx   . Rectal cancer Neg Hx   . Stomach cancer Neg Hx     Social History:  reports that he has been smoking Cigars.  He has quit using smokeless tobacco. He reports that he drinks alcohol. He reports that he does not use drugs.  ROS: UROLOGY Frequent Urination?: No Hard to postpone urination?: No Burning/pain with urination?: No Get up at night to urinate?: No Leakage of urine?: No Urine stream starts and stops?: No Trouble starting stream?: No Do you have to strain to urinate?: No Blood in urine?: No Urinary tract infection?: No Sexually transmitted disease?: No Injury to kidneys or bladder?: No Painful intercourse?: No Weak stream?: No Erection problems?: Yes Penile pain?: No  Gastrointestinal Nausea?: No Vomiting?: No Indigestion/heartburn?: No Diarrhea?: No Constipation?: No  Constitutional Fever: No Night sweats?: No Weight loss?: No Fatigue?: No  Skin Skin rash/lesions?:  No Itching?: No  Eyes Blurred vision?: No Double vision?: No  Ears/Nose/Throat Sore throat?: No Sinus problems?: No  Hematologic/Lymphatic Swollen glands?: No Easy bruising?: No  Cardiovascular Leg swelling?: No Chest pain?: No  Respiratory Cough?: No Shortness of breath?: No  Endocrine Excessive thirst?: No  Musculoskeletal Back pain?: No Joint pain?: No  Neurological Headaches?: No Dizziness?: No  Psychologic Depression?: No Anxiety?: No  Physical Exam: BP 128/83   Pulse 93   Ht 5\' 10"  (1.778 m)   Wt 174 lb 14.4 oz (79.3 kg)   BMI 25.10 kg/m  GU: Patient with uncircumcised phallus. Foreskin not easily retracted  Urethral meatus is patent.  No penile discharge. No penile lesions or rashes.  Laboratory Data: Lab Results  Component Value Date   WBC 5.4 04/11/2016   HGB 14.5 04/11/2016   HCT 43.4 04/11/2016   MCV 88.0 04/11/2016   PLT 211 04/11/2016    Lab Results  Component Value Date   CREATININE 0.75 04/13/2016     Lab Results  Component Value Date   TESTOSTERONE 802 05/30/2016    Lab Results  Component Value Date   HGBA1C 5.8 04/11/2016    Lab Results  Component Value Date   TSH 1.68 01/19/2016       Component Value Date/Time   CHOL 197 01/19/2016 1131   HDL 45.00 01/19/2016 1131   CHOLHDL 4 01/19/2016 1131   VLDL 22.0 01/19/2016 1131   LDLCALC 130 (H) 01/19/2016 1131    Lab Results  Component Value Date   AST 19 04/11/2016   Lab Results  Component Value Date   ALT 17 04/11/2016   Procedure Patient's left corpus cavernosum is identified.  An area near the base of the penis is cleansed with rubbing alcohol.  Careful to avoid the dorsal vein, 5 mcg of Trimix is injected at a 90 degree angle into the left corpus cavernosum near the base of the penis.  Patient experienced a very semi-firm erection in 15 minutes.    Assessment & Plan:    1.  Erectile dysfunction:   SHIM score was 7.  PDE5-inhibitors have not been  effective.  Test dose of Trimix is given today.  Patient will call with results.    2. BPH with LUTS:  Not addressed at this visit.  3. Hypogonadism:   On Clomid therapy.  Recent testosterone levels were 802.    4. Phimosis:  Patient states it is improving.   Return for patient to call with results.  These notes generated with voice recognition software. I apologize for typographical errors.  Zara Council, [facility] [address], [place], [place] [address] [phone]

## 2016-06-02 ENCOUNTER — Telehealth: Payer: Self-pay | Admitting: Urology

## 2016-06-02 NOTE — Telephone Encounter (Signed)
Please call the patient and ask him if his injection worked or didn't work.

## 2016-06-03 NOTE — Telephone Encounter (Signed)
Spoke with patient and he stated the injection worked a little but not really. Patient was apologetic because he forgot to call us back and let us know how it worked.

## 2016-06-04 NOTE — Telephone Encounter (Signed)
We need to schedule another test dose of Timix.  The appointment does need to be in the morning, so that if he develops priapism we can manage it within the office setting hopefully. I will be injecting more of the medication at the next visit.

## 2016-06-06 ENCOUNTER — Telehealth: Payer: Self-pay | Admitting: Family Medicine

## 2016-06-06 NOTE — Telephone Encounter (Signed)
Noted  

## 2016-06-06 NOTE — Telephone Encounter (Signed)
Michael Montgomery all info to schedule patient another appointment.

## 2016-06-06 NOTE — Telephone Encounter (Signed)
Crystal called from [place] of nurse case manager no call back. She wants to let Dr Caryl Bis know that she will be doing diabetes education over the phone with pt. If any questions call [phone]. Thank you!

## 2016-06-06 NOTE — Telephone Encounter (Signed)
FYI, thanks.

## 2016-06-07 ENCOUNTER — Ambulatory Visit: Payer: Medicaid Other

## 2016-06-07 DIAGNOSIS — M6281 Muscle weakness (generalized): Secondary | ICD-10-CM

## 2016-06-07 DIAGNOSIS — M79605 Pain in left leg: Secondary | ICD-10-CM

## 2016-06-07 DIAGNOSIS — R2681 Unsteadiness on feet: Secondary | ICD-10-CM

## 2016-06-07 NOTE — Therapy (Signed)
[place] MAIN [facility] SERVICES [address] [place], [place], [address] Phone: [phone]   Fax:  [phone]  Physical Therapy Treatment  Patient Details  Name: Michael Montgomery MRN: [id number] Date of Birth: 1960/11/12 Referring Provider: Dr. Manuella Ghazi  Encounter Date: 06/07/2016      PT End of Session - 06/07/16 1123    Visit Number 9   Number of Visits 25   Date for PT Re-Evaluation 07/05/16   PT Start Time 1119   PT Stop Time 1200   PT Time Calculation (min) 41 min   Equipment Utilized During Treatment Gait belt   Activity Tolerance Patient tolerated treatment well   Behavior During Therapy [facility] for tasks assessed/performed      Past Medical History:  Diagnosis Date  . Cataract march, april   bilateral removed   . Chickenpox   . Diabetes mellitus without complication (HCC)    metformin   . Diabetic amyotrophy [facility])    sees dr at [facility]  . Gastric ulcer   . Heart murmur    When he was younger, no recent issues  . High blood pressure    controlled with meds  . Hypercholesteremia    controlled with medication  . Knee injury    left  . Motion sickness    boats    Past Surgical History:  Procedure Laterality Date  . CATARACT EXTRACTION W/PHACO Right 02/01/2016   Procedure: CATARACT EXTRACTION PHACO AND INTRAOCULAR LENS PLACEMENT ([place]) right;  Surgeon: Ronnell Freshwater, MD;  Location: [place];  Service: Ophthalmology;  Laterality: Right;  DIABETIC - oral meds  . CATARACT EXTRACTION W/PHACO Left 03/07/2016   Procedure: CATARACT EXTRACTION PHACO AND INTRAOCULAR LENS PLACEMENT ([facility]);  Surgeon: Ronnell Freshwater, MD;  Location: [place];  Service: Ophthalmology;  Laterality: Left;  DIABETIC - oral meds   . EYE SURGERY      There were no vitals filed for this visit.      Subjective Assessment - 06/07/16 1122    Subjective Pt reports having 1 near fall when washing dishes over the weekend. He  states his knee buckled but he was able to catch himself.   Pertinent History Clinical Impairments affecting Rehab: young, quick onset of muscle weakness, mutliple falls   Limitations Sitting   How long can you sit comfortably? 1 hour   How long can you stand comfortably? N/A, except for safety   How long can you walk comfortably? Able to walk long distances just slowly, will limite himself due to fear of falling   Diagnostic tests Lumbar Puncture shows his protein was found to be elevated at 86; Had EMG studies; Last A1C was 5.8   Patient Stated Goals "strengthen my legs and be able to get around"   Currently in Pain? Yes   Pain Score 6    Pain Location Leg   Pain Orientation Left   Pain Descriptors / Indicators Sore   Pain Onset More than a month ago     Therex: Nustep L3 x 3 min (unbilled) 5x STS: 26.73s, increased risk for falls 10 MWT: 0.49m/s SPC, limited community ambulator L knee extension 3+/5 L Hip flexion 2+/5  L SAQ (3 sec hold) with 4# ankle weight, SLR, and sidelying hip abd, 2x10 ech; min verbal cues for proper technique; pt still demonstrates lag with SLR Sit <> stand with RLE elevated, 2x10; SBA for steadiness; pt demonstrated fatigue  L TKE (5 sec hold) with small  green ball and small wedge under L foot, 2x10; min verbal cues for proper technique Wall slides 2x10, min verbal cues for proper technique; pt demonstrated L quad fatigue L Eccentric step downs (5 sec count) with 4" step, 2x10; min verbal cues for proper technique L step ups with 4" step, 1x10; min verbal cues to not lock L knee during ascent/descent, increased fatigue with proper technique and towards end of session      PT Education - 06/07/16 1123    Education provided Yes   Education Details POC, exercise technique   Person(s) Educated Patient   Methods Explanation   Comprehension Verbalized understanding             PT Long Term Goals - 06/07/16 1140      PT LONG TERM GOAL #1   Title  Patient will be independent in HEP in order to increase patient's ability to maintain gains achieved in therapy and assist with return to PLOF.    Time 6   Period Weeks   Status Achieved     PT LONG TERM GOAL #2   Title Patient will increase overall strength to 4/5 in LLE in order to complete transfers and ambulation safely with decreased risk for falls.   Baseline L knee extension 3+, L hip flexion 2+ (8/15)   Time 6   Period Weeks   Status On-going     PT LONG TERM GOAL #3   Title Patient will decrease the time it takes to perform 5 times sit to stand to <10 seconds without UE use in order to decrease his risk for falls.     Time 6   Period Weeks   Status On-going     PT LONG TERM GOAL #4   Title Patient will increase his 10 m walk test time to >1.0 m/s in order to be a limited community ambulator at decreased risk for falls.    Baseline 0.34m/s with SPC (8/15)   Time 6   Period Weeks   Status On-going     PT LONG TERM GOAL #5   Title Patient will report worst pain </= 4 points on the VAS in lower extremity in order to allow patient to perform ADLs and leisure activities with reduced symptoms.    Baseline 6/10 (8/15)   Time 6   Period Weeks   Status On-going     PT LONG TERM GOAL #6   Title Patient will increase lower extremity functional scale to >60/80 to demonstrate improved functional mobility and increased tolerance with ADLs.    Time 6   Period Weeks   Status On-going               Plan - 06/07/16 1203    Clinical Impression Statement SPT reassessed pt's goals and pt has made improvements since beginning therapy, as demonstrated by him meeting 1 goal and partially meeting the others. Pt reports he feels 80% better since beginning therapy and relays that sit <> stand continue to be the most difficult thing for him to do because of the LE weakness. Pt continues to demonstrate decreased hip flexion due to weakness, but his knee extension has improved to 3+/5. Pt  gait speed improved but his 5xSTS is still limited. Pt able to participate in increased closed-chain LLE strengthening exercises with no increase in LBP this date and demonstrated increased LLE fatigue. Pt needs continued skilled PT intervention to maximize overall strength and decrease risk of falls.   Rehab Potential  Good   Clinical Impairments Affecting Rehab Potential Postive Factors: young, motivated; Negative Factors: falls, symptoms since January, extreme muscle weakness; Clinical Presentation: evolving due to possible progression of symptoms, extreme muscle weakness, and multiple falls   PT Frequency 2x / week   PT Duration 6 weeks   PT Treatment/Interventions ADLs/Self Care Home Management;Aquatic Therapy;Biofeedback;Cryotherapy;Electrical Stimulation;Moist Heat;Traction;DME Instruction;Gait training;Stair training;Functional mobility training;Therapeutic activities;Therapeutic exercise;Balance training;Manual techniques;Patient/family education;Neuromuscular re-education;Passive range of motion;Energy conservation   PT Next Visit Plan Progress HEP, balance activities, progress CKC LE strengthening ensuring LLE involvment, progress leg press, SLR   PT Home Exercise Plan HEP progressed   Consulted and Agree with Plan of Care Patient      Patient will benefit from skilled therapeutic intervention in order to improve the following deficits and impairments:  Abnormal gait, Decreased activity tolerance, Decreased balance, Decreased mobility, Decreased range of motion, Decreased strength, Impaired flexibility, Postural dysfunction, Improper body mechanics, Impaired sensation, Pain, Decreased safety awareness, Decreased knowledge of use of DME, Decreased endurance  Visit Diagnosis: Muscle weakness (generalized)  Unsteadiness on feet  Pain In Left Leg     Problem List Patient Active Problem List   Diagnosis Date Noted  . Hyperlipidemia 05/24/2016  . Loose stools 05/24/2016  . Sleeping  difficulty 04/21/2016  . CIDP (chronic inflammatory demyelinating polyneuropathy) ([place]) 04/11/2016  . Erectile dysfunction of organic origin 03/17/2016  . Phimosis 03/17/2016  . BPH with obstruction/lower urinary tract symptoms 03/17/2016  . Gingivitis 03/01/2016  . Diabetes ([place]) 02/07/2016  . Erectile dysfunction 02/07/2016  . Muscle wasting and atrophy, not elsewhere classified, unspecified lower leg 01/21/2016  . Left leg weakness 01/21/2016  . Essential hypertension 01/19/2016  . Cataracts, bilateral 01/19/2016  . Left knee pain 01/19/2016   Geraldine Solar, SPT This entire session was performed under direct supervision and direction of a licensed therapist/therapist assistant . I have personally read, edited and approve of the note as written. Gorden Harms. Tortorici, PT, DPT [phone]  Tortorici,Ashley 06/07/2016, 2:32 PM  [place] MAIN [facility] SERVICES [address] [place], [place], [address] Phone: [phone]   Fax:  [phone]  Name: Michael Montgomery MRN: [id number] Date of Birth: 1960/12/13

## 2016-06-09 ENCOUNTER — Ambulatory Visit: Payer: Medicaid Other | Admitting: Physical Therapy

## 2016-06-09 ENCOUNTER — Encounter: Payer: Self-pay | Admitting: Physical Therapy

## 2016-06-09 DIAGNOSIS — M6281 Muscle weakness (generalized): Secondary | ICD-10-CM | POA: Diagnosis not present

## 2016-06-09 DIAGNOSIS — R2681 Unsteadiness on feet: Secondary | ICD-10-CM

## 2016-06-09 DIAGNOSIS — M79605 Pain in left leg: Secondary | ICD-10-CM

## 2016-06-09 NOTE — Therapy (Signed)
[place] MAIN [facility] SERVICES [address] [place], [place], [address] Phone: [phone]   Fax:  [phone]  Physical Therapy Treatment  Patient Details  Name: Michael Montgomery MRN: [id number] Date of Birth: Feb 21, 1961 Referring Provider: Dr. Manuella Ghazi  Encounter Date: 06/09/2016      PT End of Session - 06/09/16 1159    Visit Number 10   Number of Visits 25   Date for PT Re-Evaluation 07/05/16   PT Start Time 1109   PT Stop Time 1150   PT Time Calculation (min) 41 min   Equipment Utilized During Treatment Gait belt   Activity Tolerance Patient tolerated treatment well;Patient limited by fatigue   Behavior During Therapy [facility] for tasks assessed/performed      Past Medical History:  Diagnosis Date  . Cataract march, april   bilateral removed   . Chickenpox   . Diabetes mellitus without complication (HCC)    metformin   . Diabetic amyotrophy [facility])    sees dr at [facility]  . Gastric ulcer   . Heart murmur    When he was younger, no recent issues  . High blood pressure    controlled with meds  . Hypercholesteremia    controlled with medication  . Knee injury    left  . Motion sickness    boats    Past Surgical History:  Procedure Laterality Date  . CATARACT EXTRACTION W/PHACO Right 02/01/2016   Procedure: CATARACT EXTRACTION PHACO AND INTRAOCULAR LENS PLACEMENT ([place]) right;  Surgeon: Ronnell Freshwater, MD;  Location: [place];  Service: Ophthalmology;  Laterality: Right;  DIABETIC - oral meds  . CATARACT EXTRACTION W/PHACO Left 03/07/2016   Procedure: CATARACT EXTRACTION PHACO AND INTRAOCULAR LENS PLACEMENT ([facility]);  Surgeon: Ronnell Freshwater, MD;  Location: [place];  Service: Ophthalmology;  Laterality: Left;  DIABETIC - oral meds   . EYE SURGERY      There were no vitals filed for this visit.      Subjective Assessment - 06/09/16 1112    Subjective Patient reports having 2 falls since  physical therapy on Tuesday. He states his L knee buckled as he went up the steps as well as he was reacing towards his R when standing in the kitchen his knee buckled.    Pertinent History Clinical Impairments affecting Rehab: young, quick onset of muscle weakness, mutliple falls   Limitations Sitting   How long can you sit comfortably? 1 hour   How long can you stand comfortably? N/A, except for safety   How long can you walk comfortably? Able to walk long distances just slowly, will limite himself due to fear of falling   Diagnostic tests Lumbar Puncture shows his protein was found to be elevated at 86; Had EMG studies; Last A1C was 5.8   Patient Stated Goals "strengthen my legs and be able to get around"   Currently in Pain? Yes   Pain Score 6    Pain Location Leg   Pain Orientation Left;Anterior   Pain Descriptors / Indicators Sore   Pain Onset More than a month ago     Treatment:  Warm up on Nu Step x4 min., L3, LE only, history taken throughout.  TherEx: Supine hip flexion, x10, 2x5, LLE only, min VCs to perform through greater range LAQ with 3# ankle weight, LLE only, 2x10, min VCs to perform full knee extension SLR, LLE only, 2x5, min VCs to perform with ankle DF Seated hip flexion,  BLE, 3x10 each LE, min VCs to perform through greater hip flexion range Standing marches, x2 each LE, increase LLE pain, discontinued Standing hip flexion on the LLE, x10,  2x5, rest break after each set, increased difficulty due to noted muscular fatigue. Standing hip ABD and extension, 2x10, min VCs to maintain upper body positioning  Patient given rest breaks after each set due to increased muscle fatigue. He was instructed to stand for short periods of time and take seated rest breaks for safety.  Instructed about shoe wear and to pick a supportive tennis shoe.                             PT Education - 06/09/16 1158    Education provided Yes   Education Details shoe  wear, goals of therapy, rest breaks   Person(s) Educated Patient   Methods Explanation   Comprehension Verbalized understanding             PT Long Term Goals - 06/07/16 1140      PT LONG TERM GOAL #1   Title Patient will be independent in HEP in order to increase patient's ability to maintain gains achieved in therapy and assist with return to PLOF.    Time 6   Period Weeks   Status Achieved     PT LONG TERM GOAL #2   Title Patient will increase overall strength to 4/5 in LLE in order to complete transfers and ambulation safely with decreased risk for falls.   Baseline L knee extension 3+, L hip flexion 2+ (8/15)   Time 6   Period Weeks   Status On-going     PT LONG TERM GOAL #3   Title Patient will decrease the time it takes to perform 5 times sit to stand to <10 seconds without UE use in order to decrease his risk for falls.     Time 6   Period Weeks   Status On-going     PT LONG TERM GOAL #4   Title Patient will increase his 10 m walk test time to >1.0 m/s in order to be a limited community ambulator at decreased risk for falls.    Baseline 0.49m/s with SPC (8/15)   Time 6   Period Weeks   Status On-going     PT LONG TERM GOAL #5   Title Patient will report worst pain </= 4 points on the VAS in lower extremity in order to allow patient to perform ADLs and leisure activities with reduced symptoms.    Baseline 6/10 (8/15)   Time 6   Period Weeks   Status On-going     PT LONG TERM GOAL #6   Title Patient will increase lower extremity functional scale to >60/80 to demonstrate improved functional mobility and increased tolerance with ADLs.    Time 6   Period Weeks   Status On-going               Plan - 06/09/16 1200    Clinical Impression Statement Patient reports no increase in muscle soreness; however he struggles more during activities that he has been able to perform with greater ease in the past. He has had 2 falls since Tuesday. He was given  instructed to increase rest breaks at home with shorter periods standing and understands that he should be navigating stairs with RLE performing the activity. Patient continue to have LLE weakness and pain although he reports he  has not had LBP recently. Patient would continue to benefit from skilled PT in order to address LE weakness, safety with mobility, and decrease risk for falls.    Rehab Potential Good   Clinical Impairments Affecting Rehab Potential Postive Factors: young, motivated; Negative Factors: falls, symptoms since January, extreme muscle weakness; Clinical Presentation: evolving due to possible progression of symptoms, extreme muscle weakness, and multiple falls   PT Frequency 2x / week   PT Duration 6 weeks   PT Treatment/Interventions ADLs/Self Care Home Management;Aquatic Therapy;Biofeedback;Cryotherapy;Electrical Stimulation;Moist Heat;Traction;DME Instruction;Gait training;Stair training;Functional mobility training;Therapeutic activities;Therapeutic exercise;Balance training;Manual techniques;Patient/family education;Neuromuscular re-education;Passive range of motion;Energy conservation   PT Next Visit Plan Balance activities, progress CKC LE strengthening ensuring LLE involvment, progress leg press, SLR   PT Home Exercise Plan HEP progressed   Consulted and Agree with Plan of Care Patient      Patient will benefit from skilled therapeutic intervention in order to improve the following deficits and impairments:  Abnormal gait, Decreased activity tolerance, Decreased balance, Decreased mobility, Decreased range of motion, Decreased strength, Impaired flexibility, Postural dysfunction, Improper body mechanics, Impaired sensation, Pain, Decreased safety awareness, Decreased knowledge of use of DME, Decreased endurance  Visit Diagnosis: Muscle weakness (generalized)  Unsteadiness on feet  Pain In Left Leg     Problem List Patient Active Problem List   Diagnosis Date Noted   . Hyperlipidemia 05/24/2016  . Loose stools 05/24/2016  . Sleeping difficulty 04/21/2016  . CIDP (chronic inflammatory demyelinating polyneuropathy) ([place]) 04/11/2016  . Erectile dysfunction of organic origin 03/17/2016  . Phimosis 03/17/2016  . BPH with obstruction/lower urinary tract symptoms 03/17/2016  . Gingivitis 03/01/2016  . Diabetes ([place]) 02/07/2016  . Erectile dysfunction 02/07/2016  . Muscle wasting and atrophy, not elsewhere classified, unspecified lower leg 01/21/2016  . Left leg weakness 01/21/2016  . Essential hypertension 01/19/2016  . Cataracts, bilateral 01/19/2016  . Left knee pain 01/19/2016   Tilman Neat, SPT This entire session was performed under direct supervision and direction of a licensed therapist/therapist assistant . I have personally read, edited and approve of the note as written.  Trotter,Margaret PT, DPT 06/09/2016, 1:59 PM  [place] MAIN [facility] SERVICES [address] [place], [place], [address] Phone: [phone]   Fax:  [phone]  Name: CATHERINE SAMA MRN: [id number] Date of Birth: Mar 18, 1961

## 2016-06-14 ENCOUNTER — Encounter: Payer: Self-pay | Admitting: Physical Therapy

## 2016-06-14 ENCOUNTER — Ambulatory Visit: Payer: Medicaid Other | Admitting: Physical Therapy

## 2016-06-14 DIAGNOSIS — M6281 Muscle weakness (generalized): Secondary | ICD-10-CM

## 2016-06-14 DIAGNOSIS — R2681 Unsteadiness on feet: Secondary | ICD-10-CM

## 2016-06-14 DIAGNOSIS — M79605 Pain in left leg: Secondary | ICD-10-CM

## 2016-06-14 NOTE — Therapy (Signed)
[place] MAIN [facility] SERVICES [address] [place], [place], [address] Phone: [phone]   Fax:  [phone]  Physical Therapy Treatment  Patient Details  Name: Michael Montgomery MRN: [id number] Date of Birth: April 03, 1961 Referring Provider: Dr. Manuella Ghazi  Encounter Date: 06/14/2016      PT End of Session - 06/14/16 1659    Visit Number 11   Number of Visits 25   Date for PT Re-Evaluation 07/05/16   PT Start Time 1520   PT Stop Time 1600   PT Time Calculation (min) 40 min   Equipment Utilized During Treatment Gait belt   Activity Tolerance Patient tolerated treatment well;Patient limited by fatigue   Behavior During Therapy [facility] for tasks assessed/performed      Past Medical History:  Diagnosis Date  . Cataract march, april   bilateral removed   . Chickenpox   . Diabetes mellitus without complication (HCC)    metformin   . Diabetic amyotrophy [facility])    sees dr at [facility]  . Gastric ulcer   . Heart murmur    When he was younger, no recent issues  . High blood pressure    controlled with meds  . Hypercholesteremia    controlled with medication  . Knee injury    left  . Motion sickness    boats    Past Surgical History:  Procedure Laterality Date  . CATARACT EXTRACTION W/PHACO Right 02/01/2016   Procedure: CATARACT EXTRACTION PHACO AND INTRAOCULAR LENS PLACEMENT ([place]) right;  Surgeon: Ronnell Freshwater, MD;  Location: [place];  Service: Ophthalmology;  Laterality: Right;  DIABETIC - oral meds  . CATARACT EXTRACTION W/PHACO Left 03/07/2016   Procedure: CATARACT EXTRACTION PHACO AND INTRAOCULAR LENS PLACEMENT ([facility]);  Surgeon: Ronnell Freshwater, MD;  Location: [place];  Service: Ophthalmology;  Laterality: Left;  DIABETIC - oral meds   . EYE SURGERY      There were no vitals filed for this visit.      Subjective Assessment - 06/14/16 1523    Subjective Patient had no falls since last Thursday.  He has 3 knee "buckling" but was able to maintain an upright position by using his cane. He reports he is not having RLE pain and feels like it also has mild weakness. He states exercises are going well.    Pertinent History Clinical Impairments affecting Rehab: young, quick onset of muscle weakness, mutliple falls   Limitations Sitting   How long can you sit comfortably? 1 hour   How long can you stand comfortably? N/A, except for safety   How long can you walk comfortably? Able to walk long distances just slowly, will limite himself due to fear of falling   Diagnostic tests Lumbar Puncture shows his protein was found to be elevated at 86; Had EMG studies; Last A1C was 5.8   Patient Stated Goals "strengthen my legs and be able to get around"   Currently in Pain? Yes   Pain Score 6    Pain Location Leg   Pain Orientation Right;Left;Anterior   Pain Onset More than a month ago   Pain Score 3   Pain Location Back     Treatment: NuStep, L2, LE only, x4 minutes, 2 minutes unbilled, 2 minutes taking history  Prone on elbows, x2 minutes, min VCs to relax gluteal in order to relieve back pain, decreased to 1/10 from 3/10. Supine SLR, BLE, 1x5, 2x7, each LE, min VCs to keep knee extended throughout  movement and to breath while performing.  Bridges, 2x10 with airex pad underneath feet to increase difficulty, mild report of quad pain, which upon further questioning was due to stretching, min VCs to decrease distance and patient reported no pain.  Lateral walking, with red tband, x2 each direction, CGA from SPT and moving with SPT's thigh next to patient's L knee in case of buckling, min VCs to maintain knee in extended position LE standing weight shifts, 2x10, min VCs to perform to both sides equally, no HHA, CGA with SPT's thigh in front of patient's knee in case of buckling.  Forward steps ups, 2x3 on LLE, 2x5 with RLE, min VCs to perform LLE with 1 HHA, patient reports mild pain, given greater rest  breaks and discontinued when pain continued, no HHA with RLE, CGA for stability.   Prone contract relax, x4 sets to bilateral quads, SPT performed light manual resistance when patient was instructed to perform knee extension and quad stretch held for ~15-20 seconds.  Manual Therapy: Manual long axis distraction, 2x15 seconds each LE, patient notes pain relief.                               PT Education - 06/14/16 1658    Education provided Yes   Education Details rest breaks, safety at home, balance between exercising LLE and resting   Person(s) Educated Patient   Methods Explanation   Comprehension Verbalized understanding             PT Long Term Goals - 06/07/16 1140      PT LONG TERM GOAL #1   Title Patient will be independent in HEP in order to increase patient's ability to maintain gains achieved in therapy and assist with return to PLOF.    Time 6   Period Weeks   Status Achieved     PT LONG TERM GOAL #2   Title Patient will increase overall strength to 4/5 in LLE in order to complete transfers and ambulation safely with decreased risk for falls.   Baseline L knee extension 3+, L hip flexion 2+ (8/15)   Time 6   Period Weeks   Status On-going     PT LONG TERM GOAL #3   Title Patient will decrease the time it takes to perform 5 times sit to stand to <10 seconds without UE use in order to decrease his risk for falls.     Time 6   Period Weeks   Status On-going     PT LONG TERM GOAL #4   Title Patient will increase his 10 m walk test time to >1.0 m/s in order to be a limited community ambulator at decreased risk for falls.    Baseline 0.9m/s with SPC (8/15)   Time 6   Period Weeks   Status On-going     PT LONG TERM GOAL #5   Title Patient will report worst pain </= 4 points on the VAS in lower extremity in order to allow patient to perform ADLs and leisure activities with reduced symptoms.    Baseline 6/10 (8/15)   Time 6   Period  Weeks   Status On-going     PT LONG TERM GOAL #6   Title Patient will increase lower extremity functional scale to >60/80 to demonstrate improved functional mobility and increased tolerance with ADLs.    Time 6   Period Weeks   Status On-going  Plan - 06/14/16 1659    Clinical Impression Statement Patient reports that he was safe over the weekend with continued instances of LLE knee buckling but no falls. He continues to have LLE weakness and mild pain during movements involving the quadriceps muscle. Patient does have mild LBP today that is decreased wiht prone on elbows.  Patient would continue to benefit from skilled PT in order to address LLE weakness, pain, and decrease risk for falls.     Rehab Potential Good   Clinical Impairments Affecting Rehab Potential Postive Factors: young, motivated; Negative Factors: falls, symptoms since January, extreme muscle weakness; Clinical Presentation: evolving due to possible progression of symptoms, extreme muscle weakness, and multiple falls   PT Frequency 2x / week   PT Duration 6 weeks   PT Treatment/Interventions ADLs/Self Care Home Management;Aquatic Therapy;Biofeedback;Cryotherapy;Electrical Stimulation;Moist Heat;Traction;DME Instruction;Gait training;Stair training;Functional mobility training;Therapeutic activities;Therapeutic exercise;Balance training;Manual techniques;Patient/family education;Neuromuscular re-education;Passive range of motion;Energy conservation   PT Next Visit Plan Balance activities, progress CKC LE strengthening ensuring LLE involvment, progress leg press, SLR   PT Home Exercise Plan HEP maintained   Consulted and Agree with Plan of Care Patient      Patient will benefit from skilled therapeutic intervention in order to improve the following deficits and impairments:  Abnormal gait, Decreased activity tolerance, Decreased balance, Decreased mobility, Decreased range of motion, Decreased strength,  Impaired flexibility, Postural dysfunction, Improper body mechanics, Impaired sensation, Pain, Decreased safety awareness, Decreased knowledge of use of DME, Decreased endurance  Visit Diagnosis: Muscle weakness (generalized)  Unsteadiness on feet  Pain In Left Leg     Problem List Patient Active Problem List   Diagnosis Date Noted  . Hyperlipidemia 05/24/2016  . Loose stools 05/24/2016  . Sleeping difficulty 04/21/2016  . CIDP (chronic inflammatory demyelinating polyneuropathy) ([place]) 04/11/2016  . Erectile dysfunction of organic origin 03/17/2016  . Phimosis 03/17/2016  . BPH with obstruction/lower urinary tract symptoms 03/17/2016  . Gingivitis 03/01/2016  . Diabetes ([place]) 02/07/2016  . Erectile dysfunction 02/07/2016  . Muscle wasting and atrophy, not elsewhere classified, unspecified lower leg 01/21/2016  . Left leg weakness 01/21/2016  . Essential hypertension 01/19/2016  . Cataracts, bilateral 01/19/2016  . Left knee pain 01/19/2016   Tilman Neat, SPT This entire session was performed under direct supervision and direction of a licensed therapist/therapist assistant . I have personally read, edited and approve of the note as written.  Trotter,Margaret PT, DPT 06/15/2016, 9:10 AM  [place] MAIN [facility] SERVICES [address] [place], [place], [address] Phone: [phone]   Fax:  [phone]  Name: Michael Montgomery MRN: [id number] Date of Birth: Aug 09, 1961

## 2016-06-15 ENCOUNTER — Ambulatory Visit: Payer: BLUE CROSS/BLUE SHIELD | Admitting: Urology

## 2016-06-16 ENCOUNTER — Ambulatory Visit: Payer: Medicaid Other | Admitting: Physical Therapy

## 2016-06-20 ENCOUNTER — Telehealth: Payer: Self-pay | Admitting: Family Medicine

## 2016-06-20 NOTE — Telephone Encounter (Signed)
Pt dropped off a Disability loan  form and disability parking placard to be filled out.. Placed in Dr. Tharon Aquas folder up front.. please advise pt when complete.Marland Kitchen

## 2016-06-21 ENCOUNTER — Ambulatory Visit: Payer: Medicaid Other | Admitting: Physical Therapy

## 2016-06-21 ENCOUNTER — Encounter: Payer: Self-pay | Admitting: Sports Medicine

## 2016-06-21 ENCOUNTER — Encounter: Payer: Self-pay | Admitting: Physical Therapy

## 2016-06-21 ENCOUNTER — Ambulatory Visit (INDEPENDENT_AMBULATORY_CARE_PROVIDER_SITE_OTHER): Payer: Medicaid Other | Admitting: Sports Medicine

## 2016-06-21 DIAGNOSIS — M79605 Pain in left leg: Secondary | ICD-10-CM

## 2016-06-21 DIAGNOSIS — E119 Type 2 diabetes mellitus without complications: Secondary | ICD-10-CM | POA: Diagnosis not present

## 2016-06-21 DIAGNOSIS — R2681 Unsteadiness on feet: Secondary | ICD-10-CM

## 2016-06-21 DIAGNOSIS — M79671 Pain in right foot: Secondary | ICD-10-CM

## 2016-06-21 DIAGNOSIS — M6281 Muscle weakness (generalized): Secondary | ICD-10-CM

## 2016-06-21 DIAGNOSIS — M79672 Pain in left foot: Secondary | ICD-10-CM

## 2016-06-21 DIAGNOSIS — B351 Tinea unguium: Secondary | ICD-10-CM

## 2016-06-21 NOTE — Progress Notes (Signed)
Patient ID: Michael Montgomery, male   DOB: 11-Feb-1961, 55 y.o.   MRN: [id number] Subjective: Michael Montgomery is a 55 y.o. male patient with history of diabetes who presents to office today complaining of long, painful nails  while ambulating in shoes; unable to trim.Patient on Lamisil for + fungal culture. Patient states that the glucose reading this morning was not recorded. Patient denies any new changes in medication or new problems. Patient denies any new cramping, numbness, burning or tingling in the legs.  Patient Active Problem List   Diagnosis Date Noted  . Hyperlipidemia 05/24/2016  . Loose stools 05/24/2016  . Sleeping difficulty 04/21/2016  . CIDP (chronic inflammatory demyelinating polyneuropathy) ([place]) 04/11/2016  . Erectile dysfunction of organic origin 03/17/2016  . Phimosis 03/17/2016  . BPH with obstruction/lower urinary tract symptoms 03/17/2016  . Gingivitis 03/01/2016  . Diabetes ([place]) 02/07/2016  . Erectile dysfunction 02/07/2016  . Muscle wasting and atrophy, not elsewhere classified, unspecified lower leg 01/21/2016  . Left leg weakness 01/21/2016  . Essential hypertension 01/19/2016  . Cataracts, bilateral 01/19/2016  . Left knee pain 01/19/2016   Current Outpatient Prescriptions on File Prior to Visit  Medication Sig Dispense Refill  . amLODipine (NORVASC) 10 MG tablet Take 1 tablet (10 mg total) by mouth daily. 90 tablet 1  . atorvastatin (LIPITOR) 40 MG tablet Take 1 tablet (40 mg total) by mouth daily. 90 tablet 3  . clomiPHENE (CLOMID) 50 MG tablet Take 1/2 tablet daily (Patient not taking: Reported on 06/01/2016) 30 tablet 3  . gabapentin (NEURONTIN) 300 MG capsule Take by mouth 3 (three) times daily. Reported on 03/15/2016    . gabapentin (NEURONTIN) 600 MG tablet   1  . lisinopril (PRINIVIL,ZESTRIL) 5 MG tablet Take 0.5 tablets (2.5 mg total) by mouth daily. 45 tablet 3  . metFORMIN (GLUCOPHAGE) 500 MG tablet Please take 1000 mg (2 tablets) in the morning  and 500 mg (1 tablet) at night by mouth 180 tablet 3  . Multiple Vitamin (MULTIVITAMIN) capsule Take 1 capsule by mouth daily.    Marland Kitchen nystatin-triamcinolone ointment (MYCOLOG) Apply 1 application topically 2 (two) times daily. 30 g 0  . terbinafine (LAMISIL) 250 MG tablet Take 1 tablet (250 mg total) by mouth daily. 30 tablet 2  . VIAGRA 50 MG tablet Reported on 04/29/2016  0  . zolpidem (AMBIEN CR) 6.25 MG CR tablet Take 1 tablet (6.25 mg total) by mouth at bedtime as needed for sleep. (Patient not taking: Reported on 06/01/2016) 30 tablet 0   No current facility-administered medications on file prior to visit.    No Known Allergies  Recent Results (from the past 2160 hour(s))  CBC     Status: None   Collection Time: 04/11/16  7:50 AM  Result Value Ref Range   WBC 5.4 3.8 - 10.6 K/uL   RBC 4.92 4.40 - 5.90 MIL/uL   Hemoglobin 14.5 13.0 - 18.0 g/dL   HCT 43.4 40.0 - 52.0 %   MCV 88.0 80.0 - 100.0 fL   MCH 29.4 26.0 - 34.0 pg   MCHC 33.5 32.0 - 36.0 g/dL   RDW 14.1 11.5 - 14.5 %   Platelets 211 150 - 440 K/uL  APTT     Status: None   Collection Time: 04/11/16  7:50 AM  Result Value Ref Range   aPTT 27 24 - 36 seconds  Protime-INR     Status: None   Collection Time: 04/11/16  7:50 AM  Result Value Ref Range  Prothrombin Time 12.7 11.4 - 15.0 seconds   INR 0.93   Glucose, random     Status: Abnormal   Collection Time: 04/11/16  7:50 AM  Result Value Ref Range   Glucose, Bld 151 (H) 65 - 99 mg/dL  IgG 4     Status: None   Collection Time: 04/11/16  7:50 AM  Result Value Ref Range   IgG, Subclass 4 48 2 - 96 mg/dL    Comment: (NOTE)              **Please note reference interval change** Performed At: [facility] [place], [place] [id number] Lindon Romp MD AT:[id number]   Angiotensin converting enzyme, CSF     Status: None   Collection Time: 04/11/16  9:25 AM  Result Value Ref Range   Angio Convert Enzyme 0.8 0.0 - 2.5 U/L    Comment: (NOTE) This  test was developed and its performance characteristics determined by [employer]. The U.S. Food and Drug Administration has not approved or cleared this test; however, FDA clearance or approval is not currently required for clinical use. The results are not intended to be used as the sole means for clinical diagnosis or patient management decisions. Performed At: [facility] [address] [place], [place] [id number] Esau Grew MD [id number]   Glucose, CSF     Status: None   Collection Time: 04/11/16  9:25 AM  Result Value Ref Range   Glucose, CSF 63 40 - 70 mg/dL  Protein, CSF     Status: Abnormal   Collection Time: 04/11/16  9:25 AM  Result Value Ref Range   Total  Protein, CSF 87 (H) 15 - 45 mg/dL  VDRL, CSF     Status: None   Collection Time: 04/11/16  9:25 AM  Result Value Ref Range   VDRL Quant, CSF Non Reactive Non Rea:<1:1    Comment: (NOTE) Performed At: [facility] [place], [place] [id number] Lindon Romp MD [id number]   CSF cell count with differential     Status: Abnormal   Collection Time: 04/11/16  9:25 AM  Result Value Ref Range   Tube # 2    Color, CSF COLORLESS COLORLESS   Appearance, CSF CLEAR (A) CLEAR   RBC Count, CSF 30 (H) 0 - 3 /cu mm   WBC, CSF 9 /cu mm   Segmented Neutrophils-CSF 7 %   Lymphs, CSF 86 %   Monocyte-Macrophage-Spinal Fluid 7 %   Eosinophils, CSF 0 %   Other Cells, CSF 0   Oligoclonal bands, CSF + serm     Status: None   Collection Time: 04/11/16  9:25 AM  Result Value Ref Range   CSF Oligoclonal Bands Comment     Comment: (NOTE) Zero (0) oligoclonal bands were observed in the CSF. Interpretation: Criteria for Positivity: Four (4) or more oligoclonal bands observed  only in the CSF have been shown to be most consistent with MS  using our method. [Fortini AS, Sanders EL, Weinshenker BG, and Katzmann JA: Cerebrospinal Fluid Oligoclonal Bands in the Diagnosis  of Multiple  Sclerosis. Am J Clin Pathol 120(5):672-675, 2003]. Oligoclonal bands that are present only in the CSF have been associated with a variety of inflammatory brain diseases such as multiple sclerosis (MS), subacute encephalitis, neurosyphilis, etc.  Increased IgG in the CSF is not specific for MS, but is an indication  of chronic neural inflammation. Clinical correlation indicated. Approximately 2-3% of clinically  confirmed MS patients show little or no evidence of oligoclonal bands in the CSF; however oligoclonal  bands may develop as the disease progresses. Oligoclonal Banding testing performed using Isoelectric Focusing ( IEF) and immunoblotting methodology. Performed At: [facility] [place], [place] [id number] Lindon Romp MD IN:[id number]   IgG CSF index     Status: Abnormal   Collection Time: 04/11/16  9:25 AM  Result Value Ref Range   IgG, CSF 7.1 0.0 - 8.6 mg/dL   Albumin CSF-mCnc 67 (H) 11 - 48 mg/dL   IgG (Immunoglobin G), Serum 1,068 700 - 1,600 mg/dL   Albumin 4.5 3.5 - 5.5 g/dL   IgG/Alb Ratio, CSF 0.11 0.00 - 0.25   CSF IgG Index 0.4 0.0 - 0.7    Comment: (NOTE) Performed At: [facility] [place], [place] [id number] Lindon Romp MD [id number]   Comprehensive metabolic panel     Status: Abnormal   Collection Time: 04/11/16  6:24 PM  Result Value Ref Range   Sodium 140 135 - 145 mmol/L   Potassium 3.7 3.5 - 5.1 mmol/L   Chloride 104 101 - 111 mmol/L   CO2 28 22 - 32 mmol/L   Glucose, Bld 151 (H) 65 - 99 mg/dL   BUN 15 6 - 20 mg/dL   Creatinine, Ser 0.63 0.61 - 1.24 mg/dL   Calcium 9.2 8.9 - 10.3 mg/dL   Total Protein 7.1 6.5 - 8.1 g/dL   Albumin 4.2 3.5 - 5.0 g/dL   AST 19 15 - 41 U/L   ALT 17 17 - 63 U/L   Alkaline Phosphatase 94 38 - 126 U/L   Total Bilirubin 0.5 0.3 - 1.2 mg/dL   GFR calc non Af Amer >60 >60 mL/min   GFR calc Af Amer >60 >60 mL/min    Comment: (NOTE) The eGFR has been calculated  using the CKD EPI equation. This calculation has not been validated in all clinical situations. eGFR's persistently <60 mL/min signify possible Chronic Kidney Disease.    Anion gap 8 5 - 15  Hemoglobin A1c     Status: None   Collection Time: 04/11/16  6:24 PM  Result Value Ref Range   Hgb A1c MFr Bld 5.8 4.0 - 6.0 %  Glucose, capillary     Status: Abnormal   Collection Time: 04/11/16  8:58 PM  Result Value Ref Range   Glucose-Capillary 134 (H) 65 - 99 mg/dL   Comment 1 Notify RN   Glucose, capillary     Status: Abnormal   Collection Time: 04/12/16  7:25 AM  Result Value Ref Range   Glucose-Capillary 109 (H) 65 - 99 mg/dL   Comment 1 Notify RN   Vitamin B12     Status: None   Collection Time: 04/12/16 11:05 AM  Result Value Ref Range   Vitamin B-12 426 180 - 914 pg/mL    Comment: (NOTE) This assay is not validated for testing neonatal or myeloproliferative syndrome specimens for Vitamin B12 levels. Performed at [facility]   Calcitriol (1,25 di-OH Vit D)     Status: None   Collection Time: 04/12/16 11:05 AM  Result Value Ref Range   Vit D, 1,25-Dihydroxy 24.9 19.9 - 79.3 pg/mL    Comment: (NOTE) Performed At: [facility] [address] [place], [place] [id number] Lindon Romp MD [id number]   Glucose, capillary     Status: Abnormal   Collection Time: 04/12/16 11:21 AM  Result Value Ref  Range   Glucose-Capillary 139 (H) 65 - 99 mg/dL   Comment 1 Notify RN   Glucose, capillary     Status: None   Collection Time: 04/12/16  4:12 PM  Result Value Ref Range   Glucose-Capillary 91 65 - 99 mg/dL   Comment 1 Notify RN   Glucose, capillary     Status: Abnormal   Collection Time: 04/12/16  9:01 PM  Result Value Ref Range   Glucose-Capillary 116 (H) 65 - 99 mg/dL  Basic metabolic panel     Status: Abnormal   Collection Time: 04/13/16  4:27 AM  Result Value Ref Range   Sodium 135 135 - 145 mmol/L   Potassium 3.9 3.5 - 5.1 mmol/L   Chloride 101 101 -  111 mmol/L   CO2 29 22 - 32 mmol/L   Glucose, Bld 135 (H) 65 - 99 mg/dL   BUN 12 6 - 20 mg/dL   Creatinine, Ser 0.75 0.61 - 1.24 mg/dL   Calcium 8.9 8.9 - 10.3 mg/dL   GFR calc non Af Amer >60 >60 mL/min   GFR calc Af Amer >60 >60 mL/min    Comment: (NOTE) The eGFR has been calculated using the CKD EPI equation. This calculation has not been validated in all clinical situations. eGFR's persistently <60 mL/min signify possible Chronic Kidney Disease.    Anion gap 5 5 - 15  Glucose, capillary     Status: Abnormal   Collection Time: 04/13/16  7:21 AM  Result Value Ref Range   Glucose-Capillary 142 (H) 65 - 99 mg/dL   Comment 1 Notify RN   Glucose, capillary     Status: Abnormal   Collection Time: 04/13/16 11:30 AM  Result Value Ref Range   Glucose-Capillary 122 (H) 65 - 99 mg/dL  Glucose, capillary     Status: Abnormal   Collection Time: 04/13/16  4:52 PM  Result Value Ref Range   Glucose-Capillary 101 (H) 65 - 99 mg/dL   Comment 1 Notify RN   Glucose, capillary     Status: Abnormal   Collection Time: 04/13/16  9:29 PM  Result Value Ref Range   Glucose-Capillary 124 (H) 65 - 99 mg/dL  Glucose, capillary     Status: Abnormal   Collection Time: 04/14/16  7:49 AM  Result Value Ref Range   Glucose-Capillary 146 (H) 65 - 99 mg/dL  Glucose, capillary     Status: Abnormal   Collection Time: 04/14/16 12:07 PM  Result Value Ref Range   Glucose-Capillary 139 (H) 65 - 99 mg/dL  Testosterone     Status: None   Collection Time: 05/30/16  8:55 AM  Result Value Ref Range   Testosterone 802 264 - 916 ng/dL    Comment: **Please note reference interval change** Adult male reference interval is based on a population of healthy nonobese males (BMI <30) between 44 and 27 years old. [place], [place] [phone]. PMID: [id number].     Objective: General: Patient is awake, alert, and oriented x 3 and in no acute distress.  Integument: Skin is warm, dry and supple bilateral.  Nails are tender, long, thickened and dystrophic with subungual debris, consistent with onychomycosis, 1-5 bilateral with the left great toenail being most involved. No signs of infection.No open lesions or preulcerative lesions present bilateral. Remaining integument unremarkable.  Vasculature:  Dorsalis Pedis pulse 2/4 bilateral. Posterior Tibial pulse  1/4 bilateral. Capillary fill time <3 sec 1-5 bilateral. Positive hair growth to the level of the digits.Temperature gradient within normal  limits. No varicosities present bilateral. No edema present bilateral.   Neurology: The patient has intact sensation measured with a 5.07/10g Semmes Weinstein Monofilament at all pedal sites bilateral . Vibratory sensation intact bilateral with tuning fork. No Babinski sign present bilateral.   Musculoskeletal: No symptomatic pedal deformities noted bilateral. Muscular strength 5/5 in all lower extremity muscular groups bilateral without pain on range of motion . No tenderness with calf compression bilateral.  Assessment and Plan: Problem List Items Addressed This Visit    None    Visit Diagnoses    Onychomycosis    -  Primary   Foot pain, bilateral       Diabetes mellitus without complication ([place])         -Examined patient. -Discussed and educated patient on diabetic foot care, especially with  regards to the vascular, neurological and musculoskeletal systems.  -Stressed the importance of good glycemic control and the detriment of not  controlling glucose levels in relation to the foot. -Mechanically debrided all nails 1-5 bilateral using sterile nail nipper and filed with dremel without incident  -Continue with Lamisil until completed  -Answered all patient questions -Patient to return  in 3 months for at risk foot care -Patient advised to call the office if any problems or questions arise in the meantime.  Landis Martins, DPM

## 2016-06-21 NOTE — Telephone Encounter (Signed)
Papers in your red folder

## 2016-06-21 NOTE — Telephone Encounter (Signed)
Dr. Caryl Bis filled out the handicap form, but did not fill out the disability loan due to not filling out them disability forms. Spoke with patient will pick up the forms. Placed up front to pick up.

## 2016-06-22 NOTE — Therapy (Signed)
[place] MAIN [facility] SERVICES [address] [place], [place], [address] Phone: [phone]   Fax:  [phone]  Physical Therapy Treatment  Patient Details  Name: Michael Montgomery MRN: [id number] Date of Birth: July 20, 1961 Referring Provider: Dr. Manuella Ghazi  Encounter Date: 06/21/2016      PT End of Session - 06/21/16 1731    Visit Number 12   Number of Visits 25   Date for PT Re-Evaluation 07/05/16   PT Start Time 1649   PT Stop Time 1727   PT Time Calculation (min) 38 min   Equipment Utilized During Treatment Gait belt   Activity Tolerance Patient tolerated treatment well;Patient limited by fatigue   Behavior During Therapy [facility] for tasks assessed/performed      Past Medical History:  Diagnosis Date  . Cataract march, april   bilateral removed   . Chickenpox   . Diabetes mellitus without complication (HCC)    metformin   . Diabetic amyotrophy [facility])    sees dr at [facility]  . Gastric ulcer   . Heart murmur    When he was younger, no recent issues  . High blood pressure    controlled with meds  . Hypercholesteremia    controlled with medication  . Knee injury    left  . Motion sickness    boats    Past Surgical History:  Procedure Laterality Date  . CATARACT EXTRACTION W/PHACO Right 02/01/2016   Procedure: CATARACT EXTRACTION PHACO AND INTRAOCULAR LENS PLACEMENT ([place]) right;  Surgeon: Ronnell Freshwater, MD;  Location: [place];  Service: Ophthalmology;  Laterality: Right;  DIABETIC - oral meds  . CATARACT EXTRACTION W/PHACO Left 03/07/2016   Procedure: CATARACT EXTRACTION PHACO AND INTRAOCULAR LENS PLACEMENT ([facility]);  Surgeon: Ronnell Freshwater, MD;  Location: [place];  Service: Ophthalmology;  Laterality: Left;  DIABETIC - oral meds   . EYE SURGERY      There were no vitals filed for this visit.      Subjective Assessment - 06/21/16 1652    Subjective Patient denies falls; 1 buckling episode  when walking on the grass. States the his pain has improved and denies LBP.    Pertinent History Clinical Impairments affecting Rehab: young, quick onset of muscle weakness, mutliple falls   Limitations Sitting   How long can you sit comfortably? 1 hour   How long can you stand comfortably? N/A, except for safety   How long can you walk comfortably? Able to walk long distances just slowly, will limite himself due to fear of falling   Diagnostic tests Lumbar Puncture shows his protein was found to be elevated at 86; Had EMG studies; Last A1C was 5.8   Patient Stated Goals "strengthen my legs and be able to get around"   Currently in Pain? Yes   Pain Score 5    Pain Location Leg   Pain Orientation Anterior   Pain Descriptors / Indicators Sore   Pain Onset More than a month ago      Treatment:  Instructed patient to focus on hip flexion exercises in HEP in order to address hip flexion weakness.   Supine hip flexion, BLE x10, LLE x10, min VCs to control eccentrically. SLR, LLE only, 1# ankle weight, 3x10, min VCs to maintain knee into extension and control eccentrically. Manual Therapy:  Long axis distraction, x5 min, LLE only, patient notes pain relief and states he has his son do this at home for pain relief.  Seated hip flexion, x10 each LE no weight, x10 each, LE with 2# ankle weight,  Min VCs to increase range and decrease speed Cone stacking upwards and laterally, x6 each direction, min VCs to increase weight shift to LLE, CGA and blocking of LLE in case of buckling.  Static stand with increase weight on LLE, 2x30 seconds, CGA and blocking of LLE in case of buckling, min VCs to shift weight to LLE. Instructed to ambulate over red mat on floor to simulate uneven surfaces, x4 passes, last set with cones as obstacles, CGA for safety, SPC, min VCs to take a longer step with RLE. Wall squats, 3x5, 20 second rests in between each set, min VCs to rest when performing due to limiting quad  fatigue for safety at home.  Sit to stands, 3x5, no HHA, supervision for safety, lowering surface each set to just above normal chair height, min VCs for eccentric control                            PT Education - 06/21/16 1731    Education provided Yes   Education Details rest breaks, safety, improved stepping ability with gait   Person(s) Educated Patient   Methods Explanation;Demonstration   Comprehension Verbalized understanding;Returned demonstration             PT Long Term Goals - 06/07/16 1140      PT LONG TERM GOAL #1   Title Patient will be independent in HEP in order to increase patient's ability to maintain gains achieved in therapy and assist with return to PLOF.    Time 6   Period Weeks   Status Achieved     PT LONG TERM GOAL #2   Title Patient will increase overall strength to 4/5 in LLE in order to complete transfers and ambulation safely with decreased risk for falls.   Baseline L knee extension 3+, L hip flexion 2+ (8/15)   Time 6   Period Weeks   Status On-going     PT LONG TERM GOAL #3   Title Patient will decrease the time it takes to perform 5 times sit to stand to <10 seconds without UE use in order to decrease his risk for falls.     Time 6   Period Weeks   Status On-going     PT LONG TERM GOAL #4   Title Patient will increase his 10 m walk test time to >1.0 m/s in order to be a limited community ambulator at decreased risk for falls.    Baseline 0.36m/s with SPC (8/15)   Time 6   Period Weeks   Status On-going     PT LONG TERM GOAL #5   Title Patient will report worst pain </= 4 points on the VAS in lower extremity in order to allow patient to perform ADLs and leisure activities with reduced symptoms.    Baseline 6/10 (8/15)   Time 6   Period Weeks   Status On-going     PT LONG TERM GOAL #6   Title Patient will increase lower extremity functional scale to >60/80 to demonstrate improved functional mobility and  increased tolerance with ADLs.    Time 6   Period Weeks   Status On-going               Plan - 06/22/16 1035    Clinical Impression Statement Patient reports no falls and has been safe. He is able  to perform LLE strengthening with fatigue at end of session. He states he has mild increase in pain with increased WB on LLE. Pain is decreased with manual therapy long axis distraction. Patient reports a decrease in LE tightness and soreness when performing exercises. Patient would continue to benefit from skilled PT in order to address LLE weakness, pain, and decrease his risk for falls.    Rehab Potential Good   Clinical Impairments Affecting Rehab Potential Postive Factors: young, motivated; Negative Factors: falls, symptoms since January, extreme muscle weakness; Clinical Presentation: evolving due to possible progression of symptoms, extreme muscle weakness, and multiple falls   PT Frequency 2x / week   PT Duration 6 weeks   PT Treatment/Interventions ADLs/Self Care Home Management;Aquatic Therapy;Biofeedback;Cryotherapy;Electrical Stimulation;Moist Heat;Traction;DME Instruction;Gait training;Stair training;Functional mobility training;Therapeutic activities;Therapeutic exercise;Balance training;Manual techniques;Patient/family education;Neuromuscular re-education;Passive range of motion;Energy conservation   PT Next Visit Plan Balance activities, progress CKC LE strengthening ensuring LLE involvment, progress leg press, SLR   PT Home Exercise Plan HEP maintained   Consulted and Agree with Plan of Care Patient      Patient will benefit from skilled therapeutic intervention in order to improve the following deficits and impairments:  Abnormal gait, Decreased activity tolerance, Decreased balance, Decreased mobility, Decreased range of motion, Decreased strength, Impaired flexibility, Postural dysfunction, Improper body mechanics, Impaired sensation, Pain, Decreased safety awareness,  Decreased knowledge of use of DME, Decreased endurance  Visit Diagnosis: Muscle weakness (generalized)  Unsteadiness on feet  Pain In Left Leg     Problem List Patient Active Problem List   Diagnosis Date Noted  . Hyperlipidemia 05/24/2016  . Loose stools 05/24/2016  . Sleeping difficulty 04/21/2016  . CIDP (chronic inflammatory demyelinating polyneuropathy) ([place]) 04/11/2016  . Erectile dysfunction of organic origin 03/17/2016  . Phimosis 03/17/2016  . BPH with obstruction/lower urinary tract symptoms 03/17/2016  . Gingivitis 03/01/2016  . Diabetes ([place]) 02/07/2016  . Erectile dysfunction 02/07/2016  . Muscle wasting and atrophy, not elsewhere classified, unspecified lower leg 01/21/2016  . Left leg weakness 01/21/2016  . Essential hypertension 01/19/2016  . Cataracts, bilateral 01/19/2016  . Left knee pain 01/19/2016   Tilman Neat, SPT This entire session was performed under direct supervision and direction of a licensed therapist/therapist assistant . I have personally read, edited and approve of the note as written.  Trotter,Margaret PT, DPT 06/22/2016, 11:08 AM  [place] MAIN [facility] SERVICES [address] [place], [place], [address] Phone: [phone]   Fax:  [phone]  Name: Michael Montgomery MRN: [id number] Date of Birth: 18-Jul-1961

## 2016-06-23 ENCOUNTER — Encounter: Payer: Self-pay | Admitting: Physical Therapy

## 2016-06-23 ENCOUNTER — Ambulatory Visit: Payer: Medicaid Other | Admitting: Physical Therapy

## 2016-06-23 DIAGNOSIS — M6281 Muscle weakness (generalized): Secondary | ICD-10-CM

## 2016-06-23 DIAGNOSIS — M79605 Pain in left leg: Secondary | ICD-10-CM

## 2016-06-23 DIAGNOSIS — R2681 Unsteadiness on feet: Secondary | ICD-10-CM

## 2016-06-23 NOTE — Therapy (Signed)
[place] MAIN [facility] SERVICES [address] [place], [place], [address] Phone: [phone]   Fax:  [phone]  Physical Therapy Treatment  Patient Details  Name: Michael Montgomery MRN: [id number] Date of Birth: 1961-05-10 Referring Provider: Dr. Manuella Ghazi  Encounter Date: 06/23/2016      PT End of Session - 06/23/16 1602    Visit Number 13   Number of Visits 25   Date for PT Re-Evaluation 07/05/16   PT Start Time [id number]   PT Stop Time 1346   PT Time Calculation (min) 41 min   Equipment Utilized During Treatment Gait belt   Activity Tolerance Patient tolerated treatment well;Patient limited by fatigue   Behavior During Therapy [facility] for tasks assessed/performed      Past Medical History:  Diagnosis Date  . Cataract march, april   bilateral removed   . Chickenpox   . Diabetes mellitus without complication (HCC)    metformin   . Diabetic amyotrophy [facility])    sees dr at [facility]  . Gastric ulcer   . Heart murmur    When he was younger, no recent issues  . High blood pressure    controlled with meds  . Hypercholesteremia    controlled with medication  . Knee injury    left  . Motion sickness    boats    Past Surgical History:  Procedure Laterality Date  . CATARACT EXTRACTION W/PHACO Right 02/01/2016   Procedure: CATARACT EXTRACTION PHACO AND INTRAOCULAR LENS PLACEMENT ([place]) right;  Surgeon: Ronnell Freshwater, MD;  Location: [place];  Service: Ophthalmology;  Laterality: Right;  DIABETIC - oral meds  . CATARACT EXTRACTION W/PHACO Left 03/07/2016   Procedure: CATARACT EXTRACTION PHACO AND INTRAOCULAR LENS PLACEMENT ([facility]);  Surgeon: Ronnell Freshwater, MD;  Location: [place];  Service: Ophthalmology;  Laterality: Left;  DIABETIC - oral meds   . EYE SURGERY      There were no vitals filed for this visit.      Subjective Assessment - 06/23/16 1307    Subjective Patient denies falls and pain in back,  mild pain in groin; reports he had 1 buckle but not close to falling.    Pertinent History Clinical Impairments affecting Rehab: young, quick onset of muscle weakness, mutliple falls   Limitations Sitting   How long can you sit comfortably? 1 hour   How long can you stand comfortably? N/A, except for safety   How long can you walk comfortably? Able to walk long distances just slowly, will limite himself due to fear of falling   Diagnostic tests Lumbar Puncture shows his protein was found to be elevated at 86; Had EMG studies; Last A1C was 5.8   Patient Stated Goals "strengthen my legs and be able to get around"   Currently in Pain? Yes   Pain Score 6    Pain Location Leg   Pain Descriptors / Indicators Sore   Pain Onset More than a month ago      Treatment:  NuStep, L3, BLE only, x3 minutes; 2 minutes unbilled, 1 min. Of history taking Terminal knee extension with green ball, LLE only, 3x10, min VCs on how to perform exercise and rest between sets Mini squats on wall, no HHA, close supervision-CGA for safety, 3x5, min VCs to not let LLE knee lock into extension Standing hip flexion, x10 no resistance, 2x10 against red tband resistance, min VCs for how to initially perform exercise and to increase range Tilt board,  tapping ant/post (2x10) and laterally (x10), instructed to maintain in tilt board int he middle (x30 sec ant/post and lat), CGA for safety, SPT blocking LLE, min VCs to increase weight shift.  Airex pad weight shifts (x10 each LE) and forward/lateral tapping on dots with LLE (2x5 each direction), forward tapping on dots with RLE (2x5), CGA for safety, SPT blocking LLE when in stance. Seated marches with 2# weight on LLE, 2x10 Standing hip ABD, green tband resistance, 3x8 on LLE, 3x5 RLE to decrease stance time on LLE, CGA for safety, min VCs to maintain upright position.                             PT Education - 06/23/16 1601    Education provided Yes    Education Details safety, muscle soreness   Person(s) Educated Patient   Methods Explanation   Comprehension Verbalized understanding             PT Long Term Goals - 06/07/16 1140      PT LONG TERM GOAL #1   Title Patient will be independent in HEP in order to increase patient's ability to maintain gains achieved in therapy and assist with return to PLOF.    Time 6   Period Weeks   Status Achieved     PT LONG TERM GOAL #2   Title Patient will increase overall strength to 4/5 in LLE in order to complete transfers and ambulation safely with decreased risk for falls.   Baseline L knee extension 3+, L hip flexion 2+ (8/15)   Time 6   Period Weeks   Status On-going     PT LONG TERM GOAL #3   Title Patient will decrease the time it takes to perform 5 times sit to stand to <10 seconds without UE use in order to decrease his risk for falls.     Time 6   Period Weeks   Status On-going     PT LONG TERM GOAL #4   Title Patient will increase his 10 m walk test time to >1.0 m/s in order to be a limited community ambulator at decreased risk for falls.    Baseline 0.83m/s with SPC (8/15)   Time 6   Period Weeks   Status On-going     PT LONG TERM GOAL #5   Title Patient will report worst pain </= 4 points on the VAS in lower extremity in order to allow patient to perform ADLs and leisure activities with reduced symptoms.    Baseline 6/10 (8/15)   Time 6   Period Weeks   Status On-going     PT LONG TERM GOAL #6   Title Patient will increase lower extremity functional scale to >60/80 to demonstrate improved functional mobility and increased tolerance with ADLs.    Time 6   Period Weeks   Status On-going               Plan - 06/23/16 1603    Clinical Impression Statement Patient states his LLE was mildly fatigued after last session, but that he was safe during ambulation at home. He is able to participate in physical therapy with no increase in pain. Patient continues to  require rest breaks to rest muscle. Patient would continue to benefit from skilled PT to address LLE weakness, pain, and decrease his risk for falls.   Rehab Potential Good   Clinical Impairments Affecting Rehab Potential Postive Factors:  young, motivated; Negative Factors: falls, symptoms since January, extreme muscle weakness; Clinical Presentation: evolving due to possible progression of symptoms, extreme muscle weakness, and multiple falls   PT Frequency 2x / week   PT Duration 6 weeks   PT Treatment/Interventions ADLs/Self Care Home Management;Aquatic Therapy;Biofeedback;Cryotherapy;Electrical Stimulation;Moist Heat;Traction;DME Instruction;Gait training;Stair training;Functional mobility training;Therapeutic activities;Therapeutic exercise;Balance training;Manual techniques;Patient/family education;Neuromuscular re-education;Passive range of motion;Energy conservation   PT Next Visit Plan Balance activities, progress CKC LE strengthening ensuring LLE involvment, progress leg press, SLR   PT Home Exercise Plan HEP maintained   Consulted and Agree with Plan of Care Patient      Patient will benefit from skilled therapeutic intervention in order to improve the following deficits and impairments:  Abnormal gait, Decreased activity tolerance, Decreased balance, Decreased mobility, Decreased range of motion, Decreased strength, Impaired flexibility, Postural dysfunction, Improper body mechanics, Impaired sensation, Pain, Decreased safety awareness, Decreased knowledge of use of DME, Decreased endurance  Visit Diagnosis: Muscle weakness (generalized)  Unsteadiness on feet  Pain In Left Leg     Problem List Patient Active Problem List   Diagnosis Date Noted  . Hyperlipidemia 05/24/2016  . Loose stools 05/24/2016  . Sleeping difficulty 04/21/2016  . CIDP (chronic inflammatory demyelinating polyneuropathy) ([place]) 04/11/2016  . Erectile dysfunction of organic origin 03/17/2016  . Phimosis  03/17/2016  . BPH with obstruction/lower urinary tract symptoms 03/17/2016  . Gingivitis 03/01/2016  . Diabetes ([place]) 02/07/2016  . Erectile dysfunction 02/07/2016  . Muscle wasting and atrophy, not elsewhere classified, unspecified lower leg 01/21/2016  . Left leg weakness 01/21/2016  . Essential hypertension 01/19/2016  . Cataracts, bilateral 01/19/2016  . Left knee pain 01/19/2016   Tilman Neat, SPT This entire session was performed under direct supervision and direction of a licensed therapist/therapist assistant . I have personally read, edited and approve of the note as written.  Trotter,Margaret PT, DPT 06/23/2016, 5:38 PM  [place] MAIN [facility] SERVICES [address] [place], [place], [address] Phone: [phone]   Fax:  [phone]  Name: Michael Montgomery MRN: [id number] Date of Birth: November 12, 1960

## 2016-06-24 ENCOUNTER — Telehealth: Payer: Self-pay | Admitting: *Deleted

## 2016-06-24 MED ORDER — BLOOD GLUCOSE MONITOR KIT: PACK | 0 refills | Status: DC

## 2016-06-24 NOTE — Telephone Encounter (Signed)
Please advise 

## 2016-06-24 NOTE — Telephone Encounter (Signed)
Yes we can fill this.

## 2016-06-24 NOTE — Telephone Encounter (Signed)
Ordered starter kit to send to pharmacy

## 2016-06-24 NOTE — Telephone Encounter (Signed)
Any of them will be fine. Just order the start up kit.

## 2016-06-24 NOTE — Telephone Encounter (Signed)
Which would Dr. Caryl Bis be ok with Prescribing?

## 2016-06-24 NOTE — Telephone Encounter (Signed)
Patient has requested a diabetic meter Rx sent to the pharmacy along  with test strips and lancets. He questioned if the office provided free meters  Pharmacy [facility]

## 2016-06-29 ENCOUNTER — Encounter: Payer: Self-pay | Admitting: Physical Therapy

## 2016-06-29 ENCOUNTER — Ambulatory Visit: Payer: Medicaid Other | Attending: Neurology | Admitting: Physical Therapy

## 2016-06-29 DIAGNOSIS — R2681 Unsteadiness on feet: Secondary | ICD-10-CM | POA: Insufficient documentation

## 2016-06-29 DIAGNOSIS — M6281 Muscle weakness (generalized): Secondary | ICD-10-CM

## 2016-06-29 DIAGNOSIS — M79605 Pain in left leg: Secondary | ICD-10-CM | POA: Diagnosis present

## 2016-06-29 NOTE — Telephone Encounter (Signed)
Patient was informed that meter was sent to [facility] in [facility].

## 2016-06-29 NOTE — Telephone Encounter (Signed)
Pt called today, 06/29/16. No one ever got back to him about this issue. Please call pt at [phone].

## 2016-06-29 NOTE — Therapy (Signed)
[place] MAIN [facility] SERVICES [address] [place], [place], [address] Phone: [phone]   Fax:  [phone]  Physical Therapy Treatment  Patient Details  Name: Michael Montgomery MRN: [id number] Date of Birth: 1961/02/02 Referring Provider: Dr. Manuella Ghazi  Encounter Date: 06/29/2016      PT End of Session - 06/29/16 1723    Visit Number 14   Number of Visits 25   Date for PT Re-Evaluation 07/05/16   PT Start Time 1631   PT Stop Time 1714   PT Time Calculation (min) 43 min   Equipment Utilized During Treatment Gait belt   Activity Tolerance Patient tolerated treatment well   Behavior During Therapy [facility] for tasks assessed/performed      Past Medical History:  Diagnosis Date  . Cataract march, april   bilateral removed   . Chickenpox   . Diabetes mellitus without complication (HCC)    metformin   . Diabetic amyotrophy [facility])    sees dr at [facility]  . Gastric ulcer   . Heart murmur    When he was younger, no recent issues  . High blood pressure    controlled with meds  . Hypercholesteremia    controlled with medication  . Knee injury    left  . Motion sickness    boats    Past Surgical History:  Procedure Laterality Date  . CATARACT EXTRACTION W/PHACO Right 02/01/2016   Procedure: CATARACT EXTRACTION PHACO AND INTRAOCULAR LENS PLACEMENT ([place]) right;  Surgeon: Ronnell Freshwater, MD;  Location: [place];  Service: Ophthalmology;  Laterality: Right;  DIABETIC - oral meds  . CATARACT EXTRACTION W/PHACO Left 03/07/2016   Procedure: CATARACT EXTRACTION PHACO AND INTRAOCULAR LENS PLACEMENT ([facility]);  Surgeon: Ronnell Freshwater, MD;  Location: [place];  Service: Ophthalmology;  Laterality: Left;  DIABETIC - oral meds   . EYE SURGERY      There were no vitals filed for this visit.      Subjective Assessment - 06/29/16 1634    Subjective Patient denies pain in back. Notes his pain in his leg and groin are  improved today. He states HEP is going well. He notes 1 buckle since last session but no falls.     Pertinent History Clinical Impairments affecting Rehab: young, quick onset of muscle weakness, mutliple falls   Limitations Sitting   How long can you sit comfortably? 1 hour   How long can you stand comfortably? N/A, except for safety   How long can you walk comfortably? Able to walk long distances just slowly, will limite himself due to fear of falling   Diagnostic tests Lumbar Puncture shows his protein was found to be elevated at 86; Had EMG studies; Last A1C was 5.8   Patient Stated Goals "strengthen my legs and be able to get around"   Currently in Pain? Yes   Pain Score 4    Pain Location Leg   Pain Orientation Left;Anterior   Pain Descriptors / Indicators Sore   Pain Onset More than a month ago      Treatment:  NuStep, L2 LE only, x3 minutes, 2 minutes unbilled, 1 minute billed for history taking SLR, LLE only, 2# ankle weight, 2x10, min VCs to maintain knee in extension Supine hip flexion, x2 LLE only, min VCs to perform at home to focus on hip flexion strength Terminal knee extension on wall against green ball, 2x10, min VCs to tighten quad and perform full knee extension  Sit to stands, 3x8, no UE, rest in between sets, min VCs to control eccentrically Hip flexion, red tband, 2x10, 2 HHA, min VCs to increase range and what musculature the exercise was addressing Weight shifts, 2x10, 2nd set with upward diagonal LUE reaches, SPT blocking L knee in case of blocking, min VCs to increase shift and unweight opposite LE Seated therapy ball (CGA for patient safety, minA to maintain pball in appropriate position): Hip flexion, 3x10, min VCS to increase range and maintain posture LAQ, 2x10, min VCs to increase range and improve posture Bridges on BOSU ball, 2x10, min VCs to decrease range to relieve stretch in anterior thigh  Instructed to focus on sit to stands and supine and seated hip  flexion in HEP.                                  PT Long Term Goals - 06/07/16 1140      PT LONG TERM GOAL #1   Title Patient will be independent in HEP in order to increase patient's ability to maintain gains achieved in therapy and assist with return to PLOF.    Time 6   Period Weeks   Status Achieved     PT LONG TERM GOAL #2   Title Patient will increase overall strength to 4/5 in LLE in order to complete transfers and ambulation safely with decreased risk for falls.   Baseline L knee extension 3+, L hip flexion 2+ (8/15)   Time 6   Period Weeks   Status On-going     PT LONG TERM GOAL #3   Title Patient will decrease the time it takes to perform 5 times sit to stand to <10 seconds without UE use in order to decrease his risk for falls.     Time 6   Period Weeks   Status On-going     PT LONG TERM GOAL #4   Title Patient will increase his 10 m walk test time to >1.0 m/s in order to be a limited community ambulator at decreased risk for falls.    Baseline 0.25m/s with SPC (8/15)   Time 6   Period Weeks   Status On-going     PT LONG TERM GOAL #5   Title Patient will report worst pain </= 4 points on the VAS in lower extremity in order to allow patient to perform ADLs and leisure activities with reduced symptoms.    Baseline 6/10 (8/15)   Time 6   Period Weeks   Status On-going     PT LONG TERM GOAL #6   Title Patient will increase lower extremity functional scale to >60/80 to demonstrate improved functional mobility and increased tolerance with ADLs.    Time 6   Period Weeks   Status On-going               Plan - 06/29/16 1724    Clinical Impression Statement Patient denies LLE pain today and notes a decreased in soreness to a 4/10. He was able to participate in physical therapy with no increase in pain or soreness during exercises. He does continue to require rest breaks due to LLE fatigue especially into knee extension and hip  flexion. He demonstrates poor core strength during seated pball exercises. Patient would continue to benefit from skilled PT to address LE strength, core strength, decrease risk for falls, and improve functional safety.    Rehab Potential  Good   Clinical Impairments Affecting Rehab Potential Postive Factors: young, motivated; Negative Factors: falls, symptoms since January, extreme muscle weakness; Clinical Presentation: evolving due to possible progression of symptoms, extreme muscle weakness, and multiple falls   PT Frequency 2x / week   PT Duration 6 weeks   PT Treatment/Interventions ADLs/Self Care Home Management;Aquatic Therapy;Biofeedback;Cryotherapy;Electrical Stimulation;Moist Heat;Traction;DME Instruction;Gait training;Stair training;Functional mobility training;Therapeutic activities;Therapeutic exercise;Balance training;Manual techniques;Patient/family education;Neuromuscular re-education;Passive range of motion;Energy conservation   PT Next Visit Plan Balance activities, progress CKC LE strengthening ensuring LLE involvment, progress leg press, SLR   PT Home Exercise Plan HEP maintained   Consulted and Agree with Plan of Care Patient      Patient will benefit from skilled therapeutic intervention in order to improve the following deficits and impairments:  Abnormal gait, Decreased activity tolerance, Decreased balance, Decreased mobility, Decreased range of motion, Decreased strength, Impaired flexibility, Postural dysfunction, Improper body mechanics, Impaired sensation, Pain, Decreased safety awareness, Decreased knowledge of use of DME, Decreased endurance  Visit Diagnosis: Muscle weakness (generalized)  Unsteadiness on feet  Pain In Left Leg     Problem List Patient Active Problem List   Diagnosis Date Noted  . Hyperlipidemia 05/24/2016  . Loose stools 05/24/2016  . Sleeping difficulty 04/21/2016  . CIDP (chronic inflammatory demyelinating polyneuropathy) ([place])  04/11/2016  . Erectile dysfunction of organic origin 03/17/2016  . Phimosis 03/17/2016  . BPH with obstruction/lower urinary tract symptoms 03/17/2016  . Gingivitis 03/01/2016  . Diabetes ([place]) 02/07/2016  . Erectile dysfunction 02/07/2016  . Muscle wasting and atrophy, not elsewhere classified, unspecified lower leg 01/21/2016  . Left leg weakness 01/21/2016  . Essential hypertension 01/19/2016  . Cataracts, bilateral 01/19/2016  . Left knee pain 01/19/2016   Tilman Neat, SPT This entire session was performed under direct supervision and direction of a licensed therapist/therapist assistant . I have personally read, edited and approve of the note as written.  Trotter,Margaret PT, DPT 06/30/2016, 8:56 AM  [place] MAIN [facility] SERVICES [address] [place], [place], [address] Phone: [phone]   Fax:  [phone]  Name: Michael Montgomery MRN: [id number] Date of Birth: 07-25-1961

## 2016-07-04 ENCOUNTER — Ambulatory Visit: Payer: Medicaid Other | Admitting: Physical Therapy

## 2016-07-04 DIAGNOSIS — M6281 Muscle weakness (generalized): Secondary | ICD-10-CM

## 2016-07-05 ENCOUNTER — Ambulatory Visit (INDEPENDENT_AMBULATORY_CARE_PROVIDER_SITE_OTHER): Payer: BLUE CROSS/BLUE SHIELD | Admitting: Urology

## 2016-07-05 ENCOUNTER — Encounter: Payer: Self-pay | Admitting: Urology

## 2016-07-05 VITALS — BP 150/89 | HR 92 | Ht 70.0 in | Wt 181.0 lb

## 2016-07-05 DIAGNOSIS — N401 Enlarged prostate with lower urinary tract symptoms: Secondary | ICD-10-CM | POA: Diagnosis not present

## 2016-07-05 DIAGNOSIS — N528 Other male erectile dysfunction: Secondary | ICD-10-CM

## 2016-07-05 DIAGNOSIS — E291 Testicular hypofunction: Secondary | ICD-10-CM

## 2016-07-05 DIAGNOSIS — N529 Male erectile dysfunction, unspecified: Secondary | ICD-10-CM

## 2016-07-05 DIAGNOSIS — N138 Other obstructive and reflux uropathy: Secondary | ICD-10-CM

## 2016-07-05 NOTE — Progress Notes (Signed)
3:36 PM   Michael Montgomery 1961-04-30 [id number]  Referring provider: Leone Haven, MD [address] [place], [place] [address]  Chief Complaint  Patient presents with  . Erectile Dysfunction    trimix injection    HPI: Patient is a 55 year old diabetic AAM who presents today for Trimix injection treatment for erectile dysfunction.  He also has hypogonadism that is being treated with Clomid.      Erectile dysfunction His SHIM score is 7, which is severe erectile dysfunction.   He has been having difficulty with erections for 1 year.   His major complaint is achieving and maintaining an erection.  His libido is diminished.   His risk factors for ED are age, BPH, smoking, diabetes, HTN and HLD.  He denies any painful erections or curvatures with his erections.   He is no longer having spontaneous erections.   He has tried Viagra and Cialis in the past without success.   He had good success with the Trimix injections in the office and would like to continue them at home.  He presents today to for instructions on Trimix injections and to demonstrate his proficiency with the injections.      PMH: Past Medical History:  Diagnosis Date  . Cataract march, april   bilateral removed   . Chickenpox   . Diabetes mellitus without complication (HCC)    metformin   . Diabetic amyotrophy [facility])    sees dr at [facility]  . Gastric ulcer   . Heart murmur    When he was younger, no recent issues  . High blood pressure    controlled with meds  . Hypercholesteremia    controlled with medication  . Knee injury    left  . Motion sickness    boats    Surgical History: Past Surgical History:  Procedure Laterality Date  . CATARACT EXTRACTION W/PHACO Right 02/01/2016   Procedure: CATARACT EXTRACTION PHACO AND INTRAOCULAR LENS PLACEMENT ([place]) right;  Surgeon: Ronnell Freshwater, MD;  Location: [place];  Service: Ophthalmology;  Laterality: Right;  DIABETIC - oral  meds  . CATARACT EXTRACTION W/PHACO Left 03/07/2016   Procedure: CATARACT EXTRACTION PHACO AND INTRAOCULAR LENS PLACEMENT ([facility]);  Surgeon: Ronnell Freshwater, MD;  Location: [place];  Service: Ophthalmology;  Laterality: Left;  DIABETIC - oral meds   . EYE SURGERY      Home Medications:    Medication List       Accurate as of 07/05/16  3:36 PM. Always use your most recent med list.          amLODipine 10 MG tablet Commonly known as:  NORVASC Take 1 tablet (10 mg total) by mouth daily.   atorvastatin 40 MG tablet Commonly known as:  LIPITOR Take 1 tablet (40 mg total) by mouth daily.   blood glucose meter kit and supplies Kit Dispense based on patient and insurance preference. Use up to four times daily as directed. (FOR ICD-9 250.00, 250.01).   clomiPHENE 50 MG tablet Commonly known as:  CLOMID Take 1/2 tablet daily   gabapentin 300 MG capsule Commonly known as:  NEURONTIN Take by mouth 3 (three) times daily. Reported on 03/15/2016   gabapentin 600 MG tablet Commonly known as:  NEURONTIN   lisinopril 5 MG tablet Commonly known as:  PRINIVIL,ZESTRIL Take 0.5 tablets (2.5 mg total) by mouth daily.   metFORMIN 500 MG tablet Commonly known as:  GLUCOPHAGE Please take 1000 mg (2  tablets) in the morning and 500 mg (1 tablet) at night by mouth   multivitamin capsule Take 1 capsule by mouth daily.   nystatin-triamcinolone ointment Commonly known as:  MYCOLOG Apply 1 application topically 2 (two) times daily.   terbinafine 250 MG tablet Commonly known as:  LAMISIL Take 1 tablet (250 mg total) by mouth daily.       Allergies: No Known Allergies  Family History: Family History  Problem Relation Age of Onset  . Alcoholism Father   . Hyperlipidemia Father   . Hypertension Father   . Throat cancer Father     smoked but had stopped 20 yeras before dx  . Diabetes Mother   . Kidney disease Neg Hx   . Prostate cancer Neg Hx   . Colon cancer Neg  Hx   . Colon polyps Neg Hx   . Esophageal cancer Neg Hx   . Rectal cancer Neg Hx   . Stomach cancer Neg Hx     Social History:  reports that he has been smoking Cigars.  He has quit using smokeless tobacco. He reports that he drinks alcohol. He reports that he does not use drugs.  ROS: UROLOGY Frequent Urination?: No Hard to postpone urination?: No Burning/pain with urination?: No Get up at night to urinate?: No Leakage of urine?: No Urine stream starts and stops?: No Trouble starting stream?: No Do you have to strain to urinate?: No Blood in urine?: No Urinary tract infection?: No Sexually transmitted disease?: No Injury to kidneys or bladder?: No Painful intercourse?: No Weak stream?: No Erection problems?: Yes Penile pain?: No  Gastrointestinal Nausea?: No Vomiting?: No Indigestion/heartburn?: No Diarrhea?: No Constipation?: No  Constitutional Fever: No Night sweats?: No Weight loss?: No Fatigue?: No  Skin Skin rash/lesions?: No Itching?: No  Eyes Blurred vision?: No Double vision?: No  Ears/Nose/Throat Sore throat?: No Sinus problems?: No  Hematologic/Lymphatic Swollen glands?: No Easy bruising?: No  Cardiovascular Leg swelling?: No Chest pain?: No  Respiratory Cough?: No Shortness of breath?: No  Endocrine Excessive thirst?: No  Musculoskeletal Back pain?: Yes Joint pain?: No  Neurological Headaches?: No Dizziness?: No  Psychologic Depression?: No Anxiety?: No  Physical Exam: BP (!) 150/89   Pulse 92   Ht _0  (1.778 m)   Wt 181 lb (82.1 kg)   BMI 25.97 kg/m   GU: Patient with uncircumcised phallus. Foreskin not easily retracted  Urethral meatus is patent.  No penile discharge. No penile lesions or rashes.  Laboratory Data: Lab Results  Component Value Date   WBC 5.4 04/11/2016   HGB 14.5 04/11/2016   HCT 43.4 04/11/2016   MCV 88.0 04/11/2016   PLT 211 04/11/2016    Lab Results  Component Value Date    CREATININE 0.75 04/13/2016     Lab Results  Component Value Date   TESTOSTERONE 802 05/30/2016    Lab Results  Component Value Date   HGBA1C 5.8 04/11/2016    Lab Results  Component Value Date   TSH 1.68 01/19/2016       Component Value Date/Time   CHOL 197 01/19/2016 1131   HDL 45.00 01/19/2016 1131   CHOLHDL 4 01/19/2016 1131   VLDL 22.0 01/19/2016 1131   LDLCALC 130 (H) 01/19/2016 1131    Lab Results  Component Value Date   AST 19 04/11/2016   Lab Results  Component Value Date   ALT 17 04/11/2016   Patient presents today for instructions on Trimix injections and to demonstrate his proficiency  with the injections.  The patient washed his hands.  He then was instructed to tap the syringe to allow air bubbles to float to the top of the syringe. He then depressed the plunger to allow the air to escape.  He then selected an injection site at the right base of his penis between 9 and 11:00 positions between the base and midportion of the penis. He was careful to avoid the 6 and 12:00 positions and any surface veins or arteries.  He then grafts the head of the penis towards the left with light tension.  He then cleansed the area using an alcohol swab. He then took the syringe and injected at a 90 angle into the corpus cavernosum 5 g of Trimix.  He then applied compression to the injection site.  He tolerated the procedure well. He stated he had good comfort level with the process of the injections.   Assessment & Plan:    1.  Erectile dysfunction:   SHIM score was 7.  PDE5-inhibitors have not been effective.  Trimix was effective.  Patient demonstrated his proficiency in intracavernosal injection.  He will continue the injections at home.  He is reminded of the dangers of priapism and reminded to contact our office or seek treatment in the emergency room if this should occur.  Patient to RTC in 07/2016 for SHIM and exam.    2. BPH with LUTS:  Patient to RTC in 07/2016  for IPSS, PSA and exam.   3. Hypogonadism:   Patient to continue the [place].  RTC in 07/2016 for LFT, morning testosterone, ADAM and exam.     Return in about 1 month (around 08/04/2016) for PSA, HCT, LFT, testosterone, IPSS, SHIM, ADAM and exam.  These notes generated with voice recognition software. I apologize for typographical errors.  Zara Council, [facility] [address] [place], [place] [address] [phone]

## 2016-07-07 ENCOUNTER — Ambulatory Visit: Payer: Medicaid Other | Admitting: Physical Therapy

## 2016-07-08 NOTE — Therapy (Signed)
[place] MAIN [facility] SERVICES [address] [place], [place], [address] Phone: [phone]   Fax:  [phone]  Patient Details  Name: Michael Montgomery MRN: [id number] Date of Birth: 1961/04/17 Referring Provider:  Vladimir Crofts, MD  Encounter Date: 07/04/2016  Patient came to therapy and was 10-15 min late. When therapist went to get patient started, he stated that he couldn't stay because he had to go and pick up his niece from school. He wanted to leave and reschedule. Will see patient at next session;  Leona Alen PT, DPT 07/08/2016, 9:28 AM  [place] MAIN [facility] SERVICES [address] [place], [place], [address] Phone: [phone]   Fax:  [phone]

## 2016-07-11 ENCOUNTER — Ambulatory Visit: Payer: Medicaid Other | Admitting: Physical Therapy

## 2016-07-12 ENCOUNTER — Encounter: Payer: BLUE CROSS/BLUE SHIELD | Admitting: Physical Therapy

## 2016-07-13 ENCOUNTER — Ambulatory Visit: Payer: Medicaid Other | Admitting: Physical Therapy

## 2016-07-14 ENCOUNTER — Encounter: Payer: BLUE CROSS/BLUE SHIELD | Admitting: Physical Therapy

## 2016-07-19 ENCOUNTER — Ambulatory Visit: Payer: Medicaid Other | Admitting: Physical Therapy

## 2016-07-20 ENCOUNTER — Telehealth: Payer: Self-pay | Admitting: Family Medicine

## 2016-07-20 MED ORDER — ZOLPIDEM TARTRATE ER 6.25 MG PO TBCR: 6.2500 mg | EXTENDED_RELEASE_TABLET | Freq: Every evening | ORAL | 0 refills | Status: DC | PRN

## 2016-07-20 NOTE — Telephone Encounter (Signed)
Refill printed. Please fax.

## 2016-07-20 NOTE — Telephone Encounter (Signed)
Pt has been taking the extended release.

## 2016-07-20 NOTE — Telephone Encounter (Signed)
Pt called needing a refill for Ambein. I did advise pt that I did not see the medication listed. Please advise?  Pharmacy is [id number] - [place], [address]  Call pt @ [phone]. Thank you!

## 2016-07-20 NOTE — Telephone Encounter (Signed)
Please check with the patient to see if he has been taking the extended release version recently. Thanks.

## 2016-07-20 NOTE — Telephone Encounter (Signed)
rx faxed

## 2016-07-20 NOTE — Telephone Encounter (Signed)
Per visit on 05/24/16 Michael Montgomery was reordered but now it is no longer on list.

## 2016-07-21 ENCOUNTER — Ambulatory Visit: Payer: Medicaid Other | Admitting: Physical Therapy

## 2016-07-21 ENCOUNTER — Other Ambulatory Visit: Payer: Self-pay | Admitting: Sports Medicine

## 2016-07-21 DIAGNOSIS — B351 Tinea unguium: Secondary | ICD-10-CM

## 2016-07-26 ENCOUNTER — Encounter: Payer: BLUE CROSS/BLUE SHIELD | Admitting: Physical Therapy

## 2016-07-28 ENCOUNTER — Encounter: Payer: BLUE CROSS/BLUE SHIELD | Admitting: Physical Therapy

## 2016-07-28 ENCOUNTER — Other Ambulatory Visit: Payer: BLUE CROSS/BLUE SHIELD

## 2016-07-28 DIAGNOSIS — Z79899 Other long term (current) drug therapy: Secondary | ICD-10-CM

## 2016-07-28 DIAGNOSIS — E291 Testicular hypofunction: Secondary | ICD-10-CM

## 2016-07-29 LAB — HEMATOCRIT: Hematocrit: 40.3 % (ref 37.5–51.0)

## 2016-07-29 LAB — HEPATIC FUNCTION PANEL
ALT: 15 IU/L (ref 0–44)
AST: 14 IU/L (ref 0–40)
Albumin: 4.4 g/dL (ref 3.5–5.5)
Alkaline Phosphatase: 107 IU/L (ref 39–117)
Bilirubin Total: 0.3 mg/dL (ref 0.0–1.2)
Bilirubin, Direct: 0.11 mg/dL (ref 0.00–0.40)
Total Protein: 6.8 g/dL (ref 6.0–8.5)

## 2016-07-29 LAB — TESTOSTERONE: Testosterone: 764 ng/dL (ref 264–916)

## 2016-07-29 LAB — PSA: Prostate Specific Ag, Serum: 0.4 ng/mL (ref 0.0–4.0)

## 2016-08-01 ENCOUNTER — Encounter: Payer: BLUE CROSS/BLUE SHIELD | Admitting: Physical Therapy

## 2016-08-03 ENCOUNTER — Encounter: Payer: BLUE CROSS/BLUE SHIELD | Admitting: Physical Therapy

## 2016-08-04 ENCOUNTER — Encounter: Payer: Self-pay | Admitting: Urology

## 2016-08-04 ENCOUNTER — Telehealth: Payer: Self-pay | Admitting: Urology

## 2016-08-04 ENCOUNTER — Ambulatory Visit (INDEPENDENT_AMBULATORY_CARE_PROVIDER_SITE_OTHER): Payer: Medicaid Other | Admitting: Urology

## 2016-08-04 VITALS — BP 135/81 | HR 90 | Ht 70.0 in | Wt 185.5 lb

## 2016-08-04 DIAGNOSIS — N529 Male erectile dysfunction, unspecified: Secondary | ICD-10-CM | POA: Diagnosis not present

## 2016-08-04 DIAGNOSIS — E291 Testicular hypofunction: Secondary | ICD-10-CM | POA: Diagnosis not present

## 2016-08-04 DIAGNOSIS — N401 Enlarged prostate with lower urinary tract symptoms: Secondary | ICD-10-CM | POA: Diagnosis not present

## 2016-08-04 DIAGNOSIS — N138 Other obstructive and reflux uropathy: Secondary | ICD-10-CM

## 2016-08-04 MED ORDER — CLOMIPHENE CITRATE 50 MG PO TABS: ORAL_TABLET | ORAL | 3 refills | Status: DC

## 2016-08-04 NOTE — Progress Notes (Signed)
9:13 AM   Michael Montgomery 02-28-1961 [id number]  Referring provider: Leone Haven, MD [address] [place],  [address]  Chief Complaint  Patient presents with  . Benign Prostatic Hypertrophy    1 month follow up  . Erectile Dysfunction    HPI: Patient is a 55 year old diabetic AAM with hypogonadism, ED and BPH with LUTS presents today for a one month follow up.    Hypogonadism Patient is experiencing a decrease in libido, a lack of energy, a decrease in strength and erections being less strong.  This is indicated by his responses to the ADAM questionnaire.  He is no longer having spontaneous erections at night.  He does not have sleep apnea.   His current testosterone level was 764 ng/dL on 07/28/2016.  He is currently managing his hypogonadism with Clomid 50 mg, 1/2 daily.        Androgen Deficiency in the Aging Male    [place] Name 08/04/16 0800         Androgen Deficiency in the Aging Male   Do you have a decrease in libido (sex drive) Yes     Do you have lack of energy Yes     Do you have a decrease in strength and/or endurance No     Have you lost height No     Have you noticed a decreased "enjoyment of life" No     Are you sad and/or grumpy No     Are your erections less strong Yes     Have you noticed a recent deterioration in your ability to play sports No     Are you falling asleep after dinner No     Has there been a recent deterioration in your work performance No        Erectile dysfunction His SHIM score is 12, which is mild to moderate erectile dysfunction.  His previous SHIM was 7.   He has been having difficulty with erections for 1 year.   His major complaint is achieving and maintaining an erection.  His libido is diminished.   His risk factors for ED are age, BPH, smoking, diabetes, HTN and HLD.  He denies any painful erections or curvatures with his erections.   He is no longer having spontaneous erections.   He has tried Viagra  and Cialis in the past without success.   He has had good success with the Trimix injections.     SHIM    [place] Name 08/04/16 0857         SHIM: Over the last 6 months:   How do you rate your confidence that you could get and keep an erection? Moderate     When you had erections with sexual stimulation, how often were your erections hard enough for penetration (entering your partner)? A Few Times (much less than half the time)     During sexual intercourse, how often were you able to maintain your erection after you had penetrated (entered) your partner? Very Difficult     During sexual intercourse, how difficult was it to maintain your erection to completion of intercourse? Very Difficult     When you attempted sexual intercourse, how often was it satisfactory for you? Difficult       SHIM Total Score   SHIM 12        BPH WITH LUTS His IPSS score today is 1, which is mild lower urinary tract symptomatology.  He is delighted  with his quality life due to his urinary symptoms.   His major complaint today nocturia x 1.  He has had these symptoms for several years.  He denies any dysuria, hematuria or suprapubic pain.  He also denies any recent fevers, chills, nausea or vomiting.  He does not have a family history of PCa.     IPSS    Row Name 08/04/16 0800         International Prostate Symptom Score   How often have you had the sensation of not emptying your bladder? Not at All     How often have you had to urinate less than every two hours? Not at All     How often have you found you stopped and started again several times when you urinated? Not at All     How often have you found it difficult to postpone urination? Not at All     How often have you had a weak urinary stream? Not at All     How often have you had to strain to start urination? Not at All     How many times did you typically get up at night to urinate? 1 Time     Total IPSS Score 1       Quality of Life due to urinary  symptoms   If you were to spend the rest of your life with your urinary condition just the way it is now how would you feel about that? Delighted        Score:  1-7 Mild 8-19 Moderate 20-35 Severe    PMH: Past Medical History:  Diagnosis Date  . Cataract march, april   bilateral removed   . Chickenpox   . Diabetes mellitus without complication (HCC)    metformin   . Diabetic amyotrophy [facility])    sees dr at [facility]  . Gastric ulcer   . Heart murmur    When he was younger, no recent issues  . High blood pressure    controlled with meds  . Hypercholesteremia    controlled with medication  . Knee injury    left  . Motion sickness    boats    Surgical History: Past Surgical History:  Procedure Laterality Date  . CATARACT EXTRACTION W/PHACO Right 02/01/2016   Procedure: CATARACT EXTRACTION PHACO AND INTRAOCULAR LENS PLACEMENT ([place]) right;  Surgeon: Ronnell Freshwater, MD;  Location: [facility];  Service: Ophthalmology;  Laterality: Right;  DIABETIC - oral meds  . CATARACT EXTRACTION W/PHACO Left 03/07/2016   Procedure: CATARACT EXTRACTION PHACO AND INTRAOCULAR LENS PLACEMENT ([facility]);  Surgeon: Ronnell Freshwater, MD;  Location: [facility];  Service: Ophthalmology;  Laterality: Left;  DIABETIC - oral meds   . EYE SURGERY      Home Medications:    Medication List       Accurate as of 08/04/16  9:13 AM. Always use your most recent med list.          amLODipine 10 MG tablet Commonly known as:  NORVASC Take 1 tablet (10 mg total) by mouth daily.   atorvastatin 40 MG tablet Commonly known as:  LIPITOR Take 1 tablet (40 mg total) by mouth daily.   blood glucose meter kit and supplies Kit Dispense based on patient and insurance preference. Use up to four times daily as directed. (FOR ICD-9 250.00, 250.01).   clomiPHENE 50 MG tablet Commonly known as:  CLOMID Take 1/2 tablet daily   gabapentin 300  MG capsule Commonly known as:   NEURONTIN Take by mouth 3 (three) times daily. Reported on 03/15/2016   gabapentin 600 MG tablet Commonly known as:  NEURONTIN   lisinopril 5 MG tablet Commonly known as:  PRINIVIL,ZESTRIL Take 0.5 tablets (2.5 mg total) by mouth daily.   metFORMIN 500 MG tablet Commonly known as:  GLUCOPHAGE Please take 1000 mg (2 tablets) in the morning and 500 mg (1 tablet) at night by mouth   multivitamin capsule Take 1 capsule by mouth daily.   nystatin-triamcinolone ointment Commonly known as:  MYCOLOG Apply 1 application topically 2 (two) times daily.   PRESCRIPTION MEDICATION Trimix injections   terbinafine 250 MG tablet Commonly known as:  LAMISIL Take 1 tablet (250 mg total) by mouth daily.   traZODone 50 MG tablet Commonly known as:  DESYREL Take 50 mg by mouth at bedtime.   zolpidem 6.25 MG CR tablet Commonly known as:  AMBIEN CR Take 1 tablet (6.25 mg total) by mouth at bedtime as needed for sleep.       Allergies: No Known Allergies  Family History: Family History  Problem Relation Age of Onset  . Alcoholism Father   . Hyperlipidemia Father   . Hypertension Father   . Throat cancer Father     smoked but had stopped 20 yeras before dx  . Diabetes Mother   . Kidney disease Neg Hx   . Prostate cancer Neg Hx   . Colon cancer Neg Hx   . Colon polyps Neg Hx   . Esophageal cancer Neg Hx   . Rectal cancer Neg Hx   . Stomach cancer Neg Hx     Social History:  reports that he has been smoking Cigars.  He has quit using smokeless tobacco. He reports that he drinks alcohol. He reports that he does not use drugs.  ROS: UROLOGY Frequent Urination?: No Hard to postpone urination?: No Burning/pain with urination?: No Get up at night to urinate?: No Leakage of urine?: No Urine stream starts and stops?: No Trouble starting stream?: No Do you have to strain to urinate?: No Blood in urine?: No Urinary tract infection?: No Sexually transmitted disease?: No Injury to  kidneys or bladder?: No Painful intercourse?: No Weak stream?: No Erection problems?: Yes Penile pain?: No  Gastrointestinal Nausea?: No Vomiting?: No Indigestion/heartburn?: No Diarrhea?: No Constipation?: No  Constitutional Fever: No Night sweats?: No Weight loss?: No Fatigue?: No  Skin Skin rash/lesions?: No Itching?: No  Eyes Blurred vision?: No Double vision?: No  Ears/Nose/Throat Sore throat?: No Sinus problems?: No  Hematologic/Lymphatic Swollen glands?: No Easy bruising?: No  Cardiovascular Leg swelling?: No Chest pain?: No  Respiratory Cough?: No Shortness of breath?: No  Endocrine Excessive thirst?: No  Musculoskeletal Back pain?: No Joint pain?: No  Neurological Headaches?: No Dizziness?: No  Psychologic Depression?: No Anxiety?: No  Physical Exam: BP 135/81   Pulse 90   Ht '5\' 10"'$  (1.778 m)   Wt 185 lb 8 oz (84.1 kg)   BMI 26.62 kg/m   Constitutional: Well nourished. Alert and oriented, No acute distress. HEENT: [place] AT, moist mucus membranes. Trachea midline, no masses. Cardiovascular: No clubbing, cyanosis, or edema. Respiratory: Normal respiratory effort, no increased work of breathing. GI: Abdomen is soft, non tender, non distended, no abdominal masses. Liver and spleen not palpable.  No hernias appreciated.  Stool sample for occult testing is not indicated.   GU: No CVA tenderness.  No bladder fullness or masses.  Patient with uncircumcised phallus.  Foreskin easily retracted  Urethral meatus is patent.  No penile discharge. No penile lesions or rashes. Scrotum without lesions, cysts, rashes and/or edema.  Testicles are located scrotally bilaterally. No masses are appreciated in the testicles. Left and right epididymis are normal. Rectal: Patient with  normal sphincter tone. Anus and perineum without scarring or rashes. No rectal masses are appreciated. Prostate is approximately 55 grams, no nodules are appreciated. Seminal  vesicles are normal. Skin: No rashes, bruises or suspicious lesions. Lymph: No cervical or inguinal adenopathy. Neurologic: Grossly intact, no focal deficits, moving all 4 extremities. Psychiatric: Normal mood and affect.  Laboratory Data: Lab Results  Component Value Date   WBC 5.4 04/11/2016   HGB 14.5 04/11/2016   HCT 40.3 07/28/2016   MCV 88.0 04/11/2016   PLT 211 04/11/2016    Lab Results  Component Value Date   CREATININE 0.75 04/13/2016     Lab Results  Component Value Date   TESTOSTERONE 764 07/28/2016    Lab Results  Component Value Date   HGBA1C 5.8 04/11/2016    Lab Results  Component Value Date   TSH 1.68 01/19/2016       Component Value Date/Time   CHOL 197 01/19/2016 1131   HDL 45.00 01/19/2016 1131   CHOLHDL 4 01/19/2016 1131   VLDL 22.0 01/19/2016 1131   LDLCALC 130 (H) 01/19/2016 1131    Lab Results  Component Value Date   AST 14 07/28/2016   Lab Results  Component Value Date   ALT 15 07/28/2016     Assessment & Plan:    1. Hypogonadism:     -most recent testosterone level is 764  ng/dL on 07/28/2016  -continue Clomid 50 mg, 1/2 tablet daily ; refill sent to pharmacy  -RTC in 6 months for HCT, testosterone, PSA, LFT's, ADAM and exam  2. BPH with LUTS  - IPSS score is 1/0  - Continue conservative management, avoiding bladder irritants and timed voiding's  - RTC in 6 months for IPSS, PSA and exam, as testosterone therapy can cause prostate enlargement and worsen LUTS  3. Erectile dysfunction:     -SHIM score is 12; it is improving  -continue  Trimix  -RTC in 6 months for SHIM score and exam, as testosterone therapy can affect erections   Return in about 6 months (around 02/02/2017) for ADAM, IPSS, SHIM and exam, PSA, LFT's, HCT, testosterone (before 10 AM).  These notes generated with voice recognition software. I apologize for typographical errors.  Zara Council, [facility] [address], [place], [place] [address] [phone]

## 2016-08-04 NOTE — Telephone Encounter (Signed)
Pharmacy called. Order placed

## 2016-08-04 NOTE — Telephone Encounter (Signed)
Please call in a refill for Trimix for this patient to [facility].

## 2016-08-08 ENCOUNTER — Encounter: Payer: BLUE CROSS/BLUE SHIELD | Admitting: Physical Therapy

## 2016-08-10 ENCOUNTER — Encounter: Payer: BLUE CROSS/BLUE SHIELD | Admitting: Physical Therapy

## 2016-08-24 ENCOUNTER — Ambulatory Visit: Payer: BLUE CROSS/BLUE SHIELD | Admitting: Family Medicine

## 2016-09-02 ENCOUNTER — Telehealth: Payer: Self-pay | Admitting: *Deleted

## 2016-09-02 NOTE — Telephone Encounter (Signed)
Spoke with patient about medication Gabapentin. He stated that Dr. Manuella Ghazi had prescribed this medication for him about 4 months ago. He stated that the mood swings started a couple weeks ago. He said that he will just get angry for no reason or he could be watching TV and start to cry. I have scheduled him an appointment to discuss this further with Dr. Caryl Bis. I advised patient that if he starts having thoughts of hurting himself or thoughts of hurting someone else he would need to be seen ASAP at the ED. Patient said that he does not have these thoughts. He also said that with his cough he is having some congestion. No other symptoms. I advised that he may need to be looked at for this, but there are no appointments at the clinic today. He was advised that if symptoms continue or get worse he may need to be seen sooner than his appointment on Wednesday. Patient verbalized understanding.

## 2016-09-02 NOTE — Telephone Encounter (Signed)
Pt having some phone issues. Please call pt at this number [phone].

## 2016-09-02 NOTE — Telephone Encounter (Signed)
Reviewed and agree.

## 2016-09-02 NOTE — Telephone Encounter (Signed)
Pt called back returning your call. Thank you!  Call pt @ [phone]

## 2016-09-02 NOTE — Telephone Encounter (Signed)
Pt is having side effects from his medication gabapentin. He stated that he has mood swings and a cough. Pt requested a cal to discuss this  Pt contact [phone]

## 2016-09-02 NOTE — Telephone Encounter (Signed)
LM for patient to return call.

## 2016-09-07 ENCOUNTER — Ambulatory Visit (INDEPENDENT_AMBULATORY_CARE_PROVIDER_SITE_OTHER): Payer: Medicaid Other | Admitting: Family Medicine

## 2016-09-07 ENCOUNTER — Encounter: Payer: Self-pay | Admitting: Family Medicine

## 2016-09-07 VITALS — BP 120/74 | HR 102 | Temp 98.2°F | Wt 180.6 lb

## 2016-09-07 DIAGNOSIS — R4586 Emotional lability: Secondary | ICD-10-CM | POA: Insufficient documentation

## 2016-09-07 DIAGNOSIS — K089 Disorder of teeth and supporting structures, unspecified: Secondary | ICD-10-CM

## 2016-09-07 DIAGNOSIS — F39 Unspecified mood [affective] disorder: Secondary | ICD-10-CM | POA: Diagnosis not present

## 2016-09-07 DIAGNOSIS — E119 Type 2 diabetes mellitus without complications: Secondary | ICD-10-CM

## 2016-09-07 DIAGNOSIS — J309 Allergic rhinitis, unspecified: Secondary | ICD-10-CM | POA: Insufficient documentation

## 2016-09-07 LAB — HEMOGLOBIN A1C: Hgb A1c MFr Bld: 5.6 % (ref 4.6–6.5)

## 2016-09-07 LAB — COMPREHENSIVE METABOLIC PANEL
ALT: 18 U/L (ref 0–53)
AST: 22 U/L (ref 0–37)
Albumin: 4.1 g/dL (ref 3.5–5.2)
Alkaline Phosphatase: 111 U/L (ref 39–117)
BUN: 6 mg/dL (ref 6–23)
CO2: 29 mEq/L (ref 19–32)
Calcium: 9.4 mg/dL (ref 8.4–10.5)
Chloride: 103 mEq/L (ref 96–112)
Creatinine, Ser: 0.74 mg/dL (ref 0.40–1.50)
GFR: 141.98 mL/min (ref >60.00)
Glucose, Bld: 154 mg/dL — ABNORMAL HIGH (ref 70–99)
Potassium: 4 mEq/L (ref 3.5–5.1)
Sodium: 138 mEq/L (ref 135–145)
Total Bilirubin: 0.5 mg/dL (ref 0.2–1.2)
Total Protein: 8.1 g/dL (ref 6.0–8.3)

## 2016-09-07 MED ORDER — FLUTICASONE PROPIONATE 50 MCG/ACT NA SUSP: 2.0000 | Freq: Every day | NASAL | 6 refills | Status: DC

## 2016-09-07 MED ORDER — LORATADINE 10 MG PO TABS: 10.0000 mg | ORAL_TABLET | Freq: Every day | ORAL | 11 refills | Status: DC

## 2016-09-07 NOTE — Progress Notes (Signed)
  [name], MD Phone: [phone]  Michael Montgomery is a 55 y.o. male who presents today for f/u.  DIABETES Disease Monitoring: Blood Sugar ranges-120s Polyuria/phagia/dipsia- some polyuria      Optho- up to date Medications: Compliance- taking metformin Hypoglycemic symptoms- rare, eats something and improves  Mood swings: Patient notes for the last several weeks to several months he has had some mood swings. Notes he tears up more easily than previously when watching TV. Also gets upset and angrier easier than previously. Notes sometimes it is a reaction to how his wife treats him. Other times he just brushes her comments off. No physical issues. He reports a poor relationship with his wife at this time, though he reports she has treated him poorly throughout their marriage. No SI or HI. No depression. No anxiety.  Cough: Patient notes for the last 3 weeks he has had intermittent cough and felt congested in his chest. He notes cough is nonproductive. No shortness of breath. He does note some rhinorrhea and postnasal drip. No reflux symptoms. Was seen previously at another clinic and had a chest x-ray and other workup. They prescribed an albuterol inhaler which he has not needed to use. Does have a history of allergies that typically occur during the summer.  Patient is seeing a dentist and has had several teeth removed. They're going to get him fit with partial dentures. He notes they are healing well with no pain.  PMH: Smoker   ROS see history of present illness  Objective  Physical Exam Vitals:   09/07/16 0911  BP: 120/74  Pulse: (!) 102  Temp: 98.2 F (36.8 C)    BP Readings from Last 3 Encounters:  09/07/16 120/74  08/04/16 135/81  07/05/16 (!) 150/89   Wt Readings from Last 3 Encounters:  09/07/16 180 lb 9.6 oz (81.9 kg)  08/04/16 185 lb 8 oz (84.1 kg)  07/05/16 181 lb (82.1 kg)    Physical Exam  Constitutional: No distress.  HENT:  Head: Normocephalic  and atraumatic.  Poor dentition, posterior upper and lower teeth removed with well healing gums  Cardiovascular: Normal rate, regular rhythm and normal heart sounds.   Pulmonary/Chest: Effort normal and breath sounds normal.  Neurological: He is alert.  Skin: Skin is warm. He is not diaphoretic.  Psychiatric: Mood and affect normal.     Assessment/Plan: Please see individual problem list.  Diabetes ([place]) Well-controlled. We'll check an A1c today. He'll continue metformin.  Mood swings ([place]) Suspect somewhat related to his relationship with his wife. He reports no depression or anxiety. No SI or HI. Discussed having them do counseling. He noted he would think about this. I encouraged him to work on this. Given return precautions.  Allergic rhinitis Symptoms could be related to allergic rhinitis given history of this. We'll start on Flonase and Claritin. We'll keep the albuterol inhaler to use as needed. If not improving he will let us know.  Poor dentition Following with dentistry. Has had several teeth removed. He will continue to follow with them.   Orders Placed This Encounter  Procedures  . HgB A1c  . Comp Met (CMET)     [name], MD [place]

## 2016-09-07 NOTE — Assessment & Plan Note (Signed)
Symptoms could be related to allergic rhinitis given history of this. We'll start on Flonase and Claritin. We'll keep the albuterol inhaler to use as needed. If not improving he will let us know.

## 2016-09-07 NOTE — Progress Notes (Signed)
Pre visit review using our clinic review tool, if applicable. No additional management support is needed unless otherwise documented below in the visit note. 

## 2016-09-07 NOTE — Assessment & Plan Note (Addendum)
Suspect somewhat related to his relationship with his wife. He reports no depression or anxiety. No SI or HI. Discussed having them do counseling. He noted he would think about this. I encouraged him to work on this. Given return precautions.

## 2016-09-07 NOTE — Assessment & Plan Note (Signed)
Following with dentistry. Has had several teeth removed. He will continue to follow with them.

## 2016-09-07 NOTE — Assessment & Plan Note (Signed)
Well-controlled. We'll check an A1c today. He'll continue metformin.

## 2016-09-07 NOTE — Patient Instructions (Signed)
Nice to see you. We will try on Claritin and Flonase for allergies to see if that helps with your cough. Please monitor your mood. If you develop depression, anxiety, or worsening mood swings please let us know. If you develop thoughts of harming herself or others please seek medical attention immediately. Please keep your follow-up with dentist for your dentures. Please continue to watch your blood sugar. Please continue your metformin. We'll call you with your lab results. Please start taking a baby aspirin daily as well.

## 2016-09-08 ENCOUNTER — Telehealth: Payer: Self-pay | Admitting: Family Medicine

## 2016-09-08 DIAGNOSIS — E119 Type 2 diabetes mellitus without complications: Secondary | ICD-10-CM

## 2016-09-08 NOTE — Telephone Encounter (Signed)
Pt called and was wondering if Dr. Caryl Bis would put in a referral for a dietician. Pt knows that he does not eat right and would like to speak with someone to get him on the right track. Please advise, thank you!  Call pt @ [phone].

## 2016-09-08 NOTE — Telephone Encounter (Signed)
Please place referral

## 2016-09-09 NOTE — Telephone Encounter (Signed)
Referral has been placed. 

## 2016-09-10 ENCOUNTER — Encounter: Payer: Self-pay | Admitting: Family Medicine

## 2016-09-12 ENCOUNTER — Telehealth: Payer: Self-pay | Admitting: Family Medicine

## 2016-09-12 NOTE — Telephone Encounter (Signed)
FYI - Pt called and stated that Dr. Raul Del office called regarding some lab work that he had done about 3 weeks ago, and they told him that his Potassium was high and to call Dr. Caryl Bis to keep an eye out.   Call pt  @ [phone]

## 2016-09-12 NOTE — Telephone Encounter (Signed)
Notified patient that potassium was in normal range when it was done at our clinic. Patient was fine with this

## 2016-09-12 NOTE — Telephone Encounter (Signed)
Please let the patient know that the most recent lab work we have for him reveals a potassium of 4.0 from 09/07/16. Thanks.

## 2016-09-12 NOTE — Telephone Encounter (Signed)
After looking at phone messages for Dr. Manuella Ghazi. It shows that patient had labs drawn at [facility] Montgomery was a 5.9 then. He had labs drawn here on 09/07/16 and potassium was at a 4.0. Please advise

## 2016-09-18 ENCOUNTER — Other Ambulatory Visit: Payer: Self-pay | Admitting: Family Medicine

## 2016-09-23 ENCOUNTER — Ambulatory Visit: Payer: BLUE CROSS/BLUE SHIELD | Admitting: Podiatry

## 2016-09-27 ENCOUNTER — Ambulatory Visit: Payer: BLUE CROSS/BLUE SHIELD | Admitting: Sports Medicine

## 2016-10-04 ENCOUNTER — Encounter: Payer: Self-pay | Admitting: Dietician

## 2016-10-04 ENCOUNTER — Ambulatory Visit: Payer: Medicaid Other | Attending: Neurology

## 2016-10-04 ENCOUNTER — Encounter: Payer: Medicaid Other | Attending: Family Medicine | Admitting: Dietician

## 2016-10-04 VITALS — BP 144/82 | Ht 70.0 in | Wt 186.9 lb

## 2016-10-04 DIAGNOSIS — E119 Type 2 diabetes mellitus without complications: Secondary | ICD-10-CM | POA: Diagnosis not present

## 2016-10-04 DIAGNOSIS — R2681 Unsteadiness on feet: Secondary | ICD-10-CM | POA: Insufficient documentation

## 2016-10-04 DIAGNOSIS — Z6826 Body mass index (BMI) 26.0-26.9, adult: Secondary | ICD-10-CM | POA: Insufficient documentation

## 2016-10-04 DIAGNOSIS — M6281 Muscle weakness (generalized): Secondary | ICD-10-CM | POA: Diagnosis not present

## 2016-10-04 DIAGNOSIS — Z713 Dietary counseling and surveillance: Secondary | ICD-10-CM | POA: Insufficient documentation

## 2016-10-04 DIAGNOSIS — E1144 Type 2 diabetes mellitus with diabetic amyotrophy: Secondary | ICD-10-CM

## 2016-10-04 NOTE — Progress Notes (Signed)
Diabetes Self-Management Education  Visit Type: First/Initial  Appt. Start Time: 1315 Appt. End Time:115  10/04/2016  Mr. Michael Montgomery, identified by name and date of birth, is a 55 y.o. male with a diagnosis of Diabetes: Type 2.   ASSESSMENT  Blood pressure (!) 144/82, height 5\' 10"  (1.778 m), weight 186 lb 14.4 oz (84.8 kg). Body mass index is 26.82 kg/m.      Diabetes Self-Management Education - 10/04/16 1722      Visit Information   Visit Type First/Initial     Initial Visit   Diabetes Type Type 2     Health Coping   How would you rate your overall health? Good     Psychosocial Assessment   Patient Belief/Attitude about Diabetes Motivated to manage diabetes   Self-care barriers None   Other persons present Patient   Patient Concerns Healthy Lifestyle;Weight Control;Glycemic Control;Medication;Problem Solving  become more fit, prevent complications   Special Needs None   Preferred Learning Style Visual;Auditory;Hands on   Learning Readiness Ready   What is the last grade level you completed in school? 12+2     Pre-Education Assessment   Patient understands the diabetes disease and treatment process. Needs Instruction   Patient understands incorporating nutritional management into lifestyle. Needs Instruction   Patient undertands incorporating physical activity into lifestyle. Needs Instruction  pt is disabled due to neuropathy from diabetes-weakness in left leg and neuropathy in feet and left leg   Patient understands using medications safely. Needs Instruction   Patient understands monitoring blood glucose, interpreting and using results Needs Review   Patient understands prevention, detection, and treatment of acute complications. Needs Instruction   Patient understands prevention, detection, and treatment of chronic complications. Needs Instruction   Patient understands how to develop strategies to address psychosocial issues. Needs Instruction   Patient  understands how to develop strategies to promote health/change behavior. Needs Instruction     Complications   Last HgB A1C per patient/outside source 5.6 %  09-07-16   How often do you check your blood sugar? --  FBG 2x/wk.)   Fasting Blood glucose range (mg/dL) 70-129   Have you had a dilated eye exam in the past 12 months? Yes  10-04-16 (had recent cataract surgery); appt 10-2016   Have you had a dental exam in the past 12 months? Yes  1 month ago   Are you checking your feet? Yes   How many days per week are you checking your feet? 7  c/o pain and numbness in left leg and both feet 3-5 min. 3x/wk.     Dietary Intake   Breakfast --  eats 3 meals/day; breakfast at 7-8a   Snack (morning) --  eats 3 snacks/day in AM, PM and bedtime =popcorn, chips   Lunch --  eats lunch at 12-1p; eats fried foods 4x/wk and sweets 3x/wk   Dinner --  eats supper at [employer]) --  drinks milk 1x/day, water 8+x/day, regular soda 0-1/day, diet soda 1x/day     Exercise   Exercise Type --  upper body wt training, rides bike 1 hr 4x/wk + PT 1 hr 2x/wk   How many days per week to you exercise? 6     Patient Education   Previous Diabetes Education No   Disease state  Definition of diabetes, type 1 and 2, and the diagnosis of diabetes;Explored patient's options for treatment of their diabetes   Nutrition management  Role of diet in the treatment of diabetes and  the relationship between the three main macronutrients and blood glucose level;Food label reading, portion sizes and measuring food.;Carbohydrate counting   Physical activity and exercise  Role of exercise on diabetes management, blood pressure control and cardiac health.;Helped patient identify appropriate exercises in relation to his/her diabetes, diabetes complications and other health issue.   Medications Reviewed patients medication for diabetes, action, purpose, timing of dose and side effects.   Monitoring Purpose and frequency of  SMBG.;Identified appropriate SMBG and/or A1C goals.;Daily foot exams;Yearly dilated eye exam;Interpreting lab values - A1C, lipid, urine microalbumina.   Acute complications Taught treatment of hypoglycemia - the 15 rule.   Chronic complications Relationship between chronic complications and blood glucose control;Assessed and discussed foot care and prevention of foot problems;Lipid levels, blood glucose control and heart disease;Dental care;Retinopathy and reason for yearly dilated eye exams;Reviewed with patient heart disease, higher risk of, and prevention   Psychosocial adjustment Role of stress on diabetes;Identified and addressed patients feelings and concerns about diabetes   Personal strategies to promote health Lifestyle issues that need to be addressed for better diabetes care;Helped patient develop diabetes management plan for (enter comment)      Individualized Plan for Diabetes Self-Management Training:   Learning Objective:  Patient will have a greater understanding of diabetes self-management. Patient education plan is to attend individual and/or group sessions per assessed needs and concerns.   Plan:   Patient Instructions   Check blood sugars 2 x day before breakfast and 2 hrs after supper every other day and record Exercise:  Continue PT and strength exercises and biking  for    30-60  minutes 5  days a week Avoid sugar sweetened drinks (soda, tea, coffee, sports drinks, juices) Limit intake of fried foods and sweets Eat 3 meals day, 1  snacks a day at bedtime Space meals 4-6 hours apart Eat 3-4 carb serving/meal + protein Eat 1 carb serving/snack + protein-try 1 minibag of popcorn + 1/4 c. Nuts for snack Make healthy food choices Bring blood sugar records to the next appointment/class Get a Sharps container Return for appointment/classes on:  11-01-15   Expected Outcomes:    positive Education material provided: General Meal Planning Guidelines, low BG handout  If  problems or questions, patient to contact team via: [phone]  Future DSME appointment:  10-31-16

## 2016-10-04 NOTE — Patient Instructions (Signed)
  Check blood sugars 2 x day before breakfast and 2 hrs after supper every other day and record Exercise:  Continue PT and strength exercises and biking  for    30-60  minutes 5  days a week Avoid sugar sweetened drinks (soda, tea, coffee, sports drinks, juices) Limit intake of fried foods and sweets Eat 3 meals day, 1  snacks a day at bedtime Space meals 4-6 hours apart Eat 3-4 carb serving/meal + protein Eat 1 carb serving/snack + protein-try 1 minibag of popcorn + 1/4 c. Nuts for snack Make healthy food choices Bring blood sugar records to the next appointment/class Get a Sharps container Return for appointment/classes on:  11-01-15

## 2016-10-05 NOTE — Therapy (Signed)
[place] MAIN [facility] SERVICES [address] [place], [place], [address] Phone: [phone]   Fax:  [phone]  Physical Therapy Evaluation  Patient Details  Name: Michael Montgomery MRN: [id number] Date of Birth: 06/06/1961 Referring Provider: Jennings Books MD  Encounter Date: 10/04/2016      PT End of Session - 10/04/16 1715    Visit Number 1   Number of Visits 12   Date for PT Re-Evaluation 11/15/16   PT Start Time 1600   PT Stop Time 1700   PT Time Calculation (min) 60 min   Equipment Utilized During Treatment Gait belt   Activity Tolerance Patient tolerated treatment well   Behavior During Therapy [facility] for tasks assessed/performed      Past Medical History:  Diagnosis Date  . Cataract march, april   bilateral removed   . Chickenpox   . Diabetes mellitus without complication (HCC)    metformin   . Diabetic amyotrophy [facility])    sees dr at [facility]  . Gastric ulcer   . Heart murmur    When he was younger, no recent issues  . High blood pressure    controlled with meds  . Hypercholesteremia    controlled with medication  . Knee injury    left  . Motion sickness    boats    Past Surgical History:  Procedure Laterality Date  . CATARACT EXTRACTION W/PHACO Right 02/01/2016   Procedure: CATARACT EXTRACTION PHACO AND INTRAOCULAR LENS PLACEMENT ([place]) right;  Surgeon: Ronnell Freshwater, MD;  Location: [place];  Service: Ophthalmology;  Laterality: Right;  DIABETIC - oral meds  . CATARACT EXTRACTION W/PHACO Left 03/07/2016   Procedure: CATARACT EXTRACTION PHACO AND INTRAOCULAR LENS PLACEMENT ([facility]);  Surgeon: Ronnell Freshwater, MD;  Location: [place];  Service: Ophthalmology;  Laterality: Left;  DIABETIC - oral meds   . EYE SURGERY      There were no vitals filed for this visit.       Subjective Assessment - 10/04/16 1611    Subjective Hx of diabetic amyotrohpy since March 2017. Patient states  increased diffciulty with bending the knee past 90 deg (collaspes and loses control after), sleeping (wakes up twice througout the night) decreased time to ascend/descend steps, walking (requires use of SPC), standing, and occupational responsibilities. Increased pain along the anterior thigh on the L LE worst pain is a 5/10, at the lowest 4/10.    Pertinent History Clinical Impairments affecting Rehab: young, quick onset of muscle weakness, mutliple falls   Limitations Sitting   How long can you sit comfortably? 4 hours   How long can you stand comfortably? 2 hours   Patient Stated Goals Increase strength in your legs.    Currently in Pain? Yes   Pain Score 5    Pain Location Leg   Pain Orientation Left;Anterior   Pain Descriptors / Indicators Aching   Pain Type Acute pain   Pain Onset More than a month ago   Pain Frequency Constant            [facility] PT Assessment - 10/04/16 1621      Assessment   Medical Diagnosis diabetic amyotrophy   Referring Provider Jennings Books MD   Onset Date/Surgical Date 12/23/15   Hand Dominance Right   Next MD Visit 2 months   Prior Therapy 06/24/16     Balance Screen   Has the patient fallen in the past 6 months Yes   How many  times? 3   Has the patient had a decrease in activity level because of a fear of falling?  Yes   Is the patient reluctant to leave their home because of a fear of falling?  No     Home Ecologist residence   Living Arrangements Spouse/significant other   Home Access Stairs to enter   Entrance Stairs-Number of Steps 7   Entrance Stairs-Rails Can reach both   [place] - single point     Prior Function   Level of Independence Independent with household mobility with device   Vocation Other (comment)   Vocation Requirements standing long periods of time   Leisure cook, read     Cognition   Overall Cognitive Status Within Functional Limits for tasks assessed     Strength    Right Hip Flexion 4-/5   Right Hip ABduction 4-/5   Right Hip ADduction 4-/5   Left Hip Flexion 3+/5   Left Hip ABduction 3+/5   Left Hip ADduction 4-/5   Right Knee Flexion 4-/5   Right Knee Extension 4/5   Left Knee Flexion 3+/5   Left Knee Extension 3/5   Right Ankle Dorsiflexion 3+/5   Left Ankle Dorsiflexion 3+/5     Transfers   Five time sit to stand comments  15.5sec     Standardized Balance Assessment   10 Meter Walk 1.02 m/s     Timed Up and Go Test   Normal TUG (seconds) 16      TREATMENT: Seated abduction in sitting-- x 10 Seated adduction in sitting -- x 10 Standing abduction -- x 10  Hip extension in standing -- x10 Sit to stands -- x 10  Leg Press machine -- x 10  Seated knee flexion/ext -- x 10  Bridges in hookyling -- x10  SLR in supine -- x 10 [place] crunch -- x10 Walking program -- 5 min          PT Education - 10/04/16 1713    Education provided Yes   Education Details HEP: added walking program, hip abd/ext, knee ext/flex, bridges, sit to stands,    Person(s) Educated Patient   Methods Explanation;Demonstration   Comprehension Verbalized understanding;Returned demonstration             PT Long Term Goals - 10/05/16 0911      PT LONG TERM GOAL #1   Title Patient will be independent in HEP in order to increase patient's ability to maintain gains achieved in therapy and assist with return to PLOF.    Time 6   Period Weeks   Status New     PT LONG TERM GOAL #2   Title Patient will increase overall strength to 4/5 in LLE in order to complete transfers and ambulation safely with decreased risk for falls.   Baseline L knee extension 3+, L hip flexion 3+   Time 6   Period Weeks   Status New     PT LONG TERM GOAL #3   Title Patient will decrease the time it takes to perform 5 times sit to stand to <10 seconds without UE use in order to decrease his risk for falls.     Baseline 5xSTS: 15.5 sec   Time 6   Period Weeks   Status New      PT LONG TERM GOAL #4   Title Patient will increase his 10 m walk test time to >1.2 m/s in order to be  a limited community ambulator at decreased risk for falls.    Baseline 1.02 with SPC    Time 6   Period Weeks   Status New     PT LONG TERM GOAL #5   Title Patient will report worst pain <4 points on the VAS in lower extremity in order to allow patient to perform ADLs and leisure activities with reduced symptoms.    Baseline 5/10    Time 6   Period Weeks   Status New     PT LONG TERM GOAL #6   Title Patient will improve TUG scores to under 14 sec to decrease fall risk and to more safely ambulate to the bathroom   Baseline TUG: 16 sec   Time 6   Period Weeks   Status New               Plan - 10/05/16 0906    Clinical Impression Statement Patient is a 55 yo right hand dominant male presenting with decreased L LE pain and weakness secondary to diabetic amyotrophy. Patient presents with increased fall risk and decreased muscular strength/coordination in his L LE as indicated by decreased 8mW, TUG, and MMT scores. Patient demonstrates decreased motor control of his L LE requiring tactile cueing to perform and patient will benefit from further skilled therapy focused on improving current limitations to return to prior level of function.    Rehab Potential Good   Clinical Impairments Affecting Rehab Potential Postive Factors: young, motivated; Negative Factors: falls, symptoms since January, extreme muscle weakness; Clinical Presentation: evolving due to possible progression of symptoms, extreme muscle weakness, and multiple falls   PT Frequency 2x / week   PT Duration 6 weeks   PT Treatment/Interventions ADLs/Self Care Home Management;Aquatic Therapy;Biofeedback;Cryotherapy;Electrical Stimulation;Moist Heat;Traction;DME Instruction;Gait training;Stair training;Functional mobility training;Therapeutic activities;Therapeutic exercise;Balance training;Manual  techniques;Patient/family education;Neuromuscular re-education;Passive range of motion;Energy conservation   PT Next Visit Plan Balance activities, progress CKC LE strengthening ensuring LLE involvment, progress leg press, SLR   PT Home Exercise Plan HEP maintained   Consulted and Agree with Plan of Care Patient      Patient will benefit from skilled therapeutic intervention in order to improve the following deficits and impairments:  Abnormal gait, Decreased activity tolerance, Decreased balance, Decreased mobility, Decreased range of motion, Decreased strength, Impaired flexibility, Postural dysfunction, Improper body mechanics, Impaired sensation, Pain, Decreased safety awareness, Decreased knowledge of use of DME, Decreased endurance  Visit Diagnosis: Muscle weakness (generalized)  Unsteadiness on feet     Problem List Patient Active Problem List   Diagnosis Date Noted  . Mood swings ([place]) 09/07/2016  . Allergic rhinitis 09/07/2016  . Poor dentition 09/07/2016  . Hyperlipidemia 05/24/2016  . Loose stools 05/24/2016  . Sleeping difficulty 04/21/2016  . CIDP (chronic inflammatory demyelinating polyneuropathy) ([place]) 04/11/2016  . Erectile dysfunction of organic origin 03/17/2016  . Phimosis 03/17/2016  . BPH with obstruction/lower urinary tract symptoms 03/17/2016  . Gingivitis 03/01/2016  . Diabetes ([place]) 02/07/2016  . Erectile dysfunction 02/07/2016  . Muscle wasting and atrophy, not elsewhere classified, unspecified lower leg 01/21/2016  . Left leg weakness 01/21/2016  . Essential hypertension 01/19/2016  . Cataracts, bilateral 01/19/2016  . Left knee pain 01/19/2016    Blythe Stanford, PT DPT 10/05/2016, 9:15 AM  [place] MAIN [facility] SERVICES [address] [place], [place], [address] Phone: [phone]   Fax:  [phone]  Name: IDELL MATTHEY MRN: [id number] Date of Birth: 30-Sep-1961

## 2016-10-05 NOTE — Addendum Note (Signed)
Addended by: Blain Pais on: 10/05/2016 09:23 AM   Modules accepted: Orders

## 2016-10-10 ENCOUNTER — Encounter: Payer: Self-pay | Admitting: Urology

## 2016-10-10 ENCOUNTER — Ambulatory Visit (INDEPENDENT_AMBULATORY_CARE_PROVIDER_SITE_OTHER): Payer: Medicaid Other | Admitting: Urology

## 2016-10-10 VITALS — BP 161/91 | HR 93 | Ht 70.0 in | Wt 186.3 lb

## 2016-10-10 DIAGNOSIS — N481 Balanitis: Secondary | ICD-10-CM | POA: Diagnosis not present

## 2016-10-10 NOTE — Progress Notes (Signed)
10:08 AM   Michael Montgomery June 07, 1961 [id number]  Referring provider: Leone Haven, MD [address] [place], [place] [address]  Chief Complaint  Patient presents with  . Advice Only    Talk about circumscision    HPI: Patient is a 55 year old diabetic AAM with hypogonadism, ED and BPH with LUTS who presents today for consultation for a possible circumcision.    Patient is having a more difficult time retracting the foreskin over the last several weeks.  He is also experiencing increase irritation and difficulty cleansing the head of the penis.  His most recent HbgA1C was 5.6% on 09/07/2016.  He does take a daily 81 mg ASA.  He has not had any changes in his urinary symptoms.  He has not had penile swelling or discharge.  He has not had any recent fevers, chills, nausea or vomiting.  He has no history of bleeding disorders.    Hypogonadism Patient is experiencing a decrease in libido, a lack of energy, a decrease in strength and erections being less strong.  This is indicated by his responses to the ADAM questionnaire.  He is no longer having spontaneous erections at night.  He does not have sleep apnea.   His current testosterone level was 764 ng/dL on 07/28/2016.  He is currently managing his hypogonadism with Clomid 50 mg, 1/2 daily.      Erectile dysfunction His SHIM score is 12, which is mild to moderate erectile dysfunction.  His previous SHIM was 7.   He has been having difficulty with erections for 1 year.   His major complaint is achieving and maintaining an erection.  His libido is diminished.   His risk factors for ED are age, BPH, smoking, diabetes, HTN and HLD.  He denies any painful erections or curvatures with his erections.   He is no longer having spontaneous erections.   He has tried Viagra and Cialis in the past without success.   He has had good success with the Trimix injections.   BPH WITH LUTS His IPSS score today is 1, which is mild lower  urinary tract symptomatology.  He is delighted with his quality life due to his urinary symptoms.   His major complaint today nocturia x 1.  He has had these symptoms for several years.  He denies any dysuria, hematuria or suprapubic pain.  He also denies any recent fevers, chills, nausea or vomiting.  He does not have a family history of PCa.   PMH: Past Medical History:  Diagnosis Date  . Cataract march, april   bilateral removed   . Chickenpox   . Diabetes mellitus without complication (HCC)    metformin   . Diabetic amyotrophy [facility])    sees dr at [facility]  . Gastric ulcer   . Heart murmur    When he was younger, no recent issues  . High blood pressure    controlled with meds  . Hypercholesteremia    controlled with medication  . Knee injury    left  . Motion sickness    boats    Surgical History: Past Surgical History:  Procedure Laterality Date  . CATARACT EXTRACTION W/PHACO Right 02/01/2016   Procedure: CATARACT EXTRACTION PHACO AND INTRAOCULAR LENS PLACEMENT ([place]) right;  Surgeon: Ronnell Freshwater, MD;  Location: [place];  Service: Ophthalmology;  Laterality: Right;  DIABETIC - oral meds  . CATARACT EXTRACTION W/PHACO Left 03/07/2016   Procedure: CATARACT EXTRACTION PHACO AND INTRAOCULAR LENS  PLACEMENT ([facility]);  Surgeon: Ronnell Freshwater, MD;  Location: [place];  Service: Ophthalmology;  Laterality: Left;  DIABETIC - oral meds   . EYE SURGERY      Home Medications:  Allergies as of 10/10/2016   No Known Allergies     Medication List       Accurate as of 10/10/16 10:08 AM. Always use your most recent med list.          AMITIZA 24 MCG capsule Generic drug:  lubiprostone Take 24 mcg by mouth 2 (two) times daily.   amLODipine 10 MG tablet Commonly known as:  NORVASC TAKE 1 TABLET (10 MG TOTAL) BY MOUTH DAILY.   atorvastatin 40 MG tablet Commonly known as:  LIPITOR Take 1 tablet (40 mg total) by mouth daily.   fluticasone  50 MCG/ACT nasal spray Commonly known as:  FLONASE Place 2 sprays into both nostrils daily.   gabapentin 300 MG capsule Commonly known as:  NEURONTIN Take 1 capsule by mouth 2 (two) times daily. Take 1 cap in AM and 2 caps at bedtime   ibuprofen 400 MG tablet Commonly known as:  ADVIL,MOTRIN Take 1 tablet by mouth as needed.   lisinopril 5 MG tablet Commonly known as:  PRINIVIL,ZESTRIL Take 0.5 tablets (2.5 mg total) by mouth daily.   loratadine 10 MG tablet Commonly known as:  CLARITIN Take 1 tablet (10 mg total) by mouth daily.   metFORMIN 500 MG tablet Commonly known as:  GLUCOPHAGE Please take 1000 mg (2 tablets) in the morning and 500 mg (1 tablet) at night by mouth   multivitamin capsule Take 1 capsule by mouth daily.   nystatin-triamcinolone ointment Commonly known as:  MYCOLOG Apply 1 application topically 2 (two) times daily.   terbinafine 250 MG tablet Commonly known as:  LAMISIL Take 1 tablet (250 mg total) by mouth daily.   traZODone 50 MG tablet Commonly known as:  DESYREL Take 50 mg by mouth at bedtime as needed.       Allergies: No Known Allergies  Family History: Family History  Problem Relation Age of Onset  . Alcoholism Father   . Hyperlipidemia Father   . Hypertension Father   . Throat cancer Father     smoked but had stopped 20 yeras before dx  . Diabetes Mother   . Kidney disease Neg Hx   . Prostate cancer Neg Hx   . Colon cancer Neg Hx   . Colon polyps Neg Hx   . Esophageal cancer Neg Hx   . Rectal cancer Neg Hx   . Stomach cancer Neg Hx     Social History:  reports that he has quit smoking. His smoking use included Cigars. He has quit using smokeless tobacco. He reports that he drinks about 1.2 oz of alcohol per week . He reports that he does not use drugs.  ROS: UROLOGY Frequent Urination?: No Hard to postpone urination?: No Burning/pain with urination?: No Get up at night to urinate?: No Leakage of urine?: No Urine stream  starts and stops?: No Trouble starting stream?: No Do you have to strain to urinate?: No Blood in urine?: No Urinary tract infection?: No Sexually transmitted disease?: No Injury to kidneys or bladder?: No Painful intercourse?: No Weak stream?: No Erection problems?: Yes Penile pain?: No  Gastrointestinal Nausea?: No Vomiting?: No Indigestion/heartburn?: No Diarrhea?: No Constipation?: No  Constitutional Fever: No Night sweats?: No Weight loss?: No Fatigue?: No  Skin Skin rash/lesions?: No Itching?: No  Eyes  Blurred vision?: No Double vision?: No  Ears/Nose/Throat Sore throat?: No Sinus problems?: No  Hematologic/Lymphatic Swollen glands?: No Easy bruising?: No  Cardiovascular Leg swelling?: No Chest pain?: No  Respiratory Cough?: Yes Shortness of breath?: No  Endocrine Excessive thirst?: No  Musculoskeletal Back pain?: No Joint pain?: No  Neurological Headaches?: No Dizziness?: No  Psychologic Depression?: No Anxiety?: No  Physical Exam: BP (!) 161/91   Pulse 93   Ht 5\' 10"  (1.778 m)   Wt 186 lb 4.8 oz (84.5 kg)   BMI 26.73 kg/m   Constitutional: Well nourished. Alert and oriented, No acute distress. HEENT: [place] AT, moist mucus membranes. Trachea midline, no masses. Cardiovascular: No clubbing, cyanosis, or edema. Respiratory: Normal respiratory effort, no increased work of breathing. GI: Abdomen is soft, non tender, non distended, no abdominal masses. Liver and spleen not palpable.  No hernias appreciated.  Stool sample for occult testing is not indicated.   GU: No CVA tenderness.  No bladder fullness or masses.  Patient with uncircumcised phallus. Foreskin easily retracted with balanitis.   Urethral meatus is patent.  No penile discharge. No penile lesions or rashes. Scrotum without lesions, cysts, rashes and/or edema.  Testicles are located scrotally bilaterally. No masses are appreciated in the testicles. Left and right epididymis are  normal. Rectal: Deferred.   Skin: No rashes, bruises or suspicious lesions. Lymph: No cervical or inguinal adenopathy. Neurologic: Grossly intact, no focal deficits, moving all 4 extremities. Psychiatric: Normal mood and affect.  Laboratory Data: Lab Results  Component Value Date   WBC 5.4 04/11/2016   HGB 14.5 04/11/2016   HCT 40.3 07/28/2016   MCV 88.0 04/11/2016   PLT 211 04/11/2016    Lab Results  Component Value Date   CREATININE 0.74 09/07/2016     Lab Results  Component Value Date   TESTOSTERONE 764 07/28/2016    Lab Results  Component Value Date   HGBA1C 5.6 09/07/2016    Lab Results  Component Value Date   TSH 1.68 01/19/2016       Component Value Date/Time   CHOL 197 01/19/2016 1131   HDL 45.00 01/19/2016 1131   CHOLHDL 4 01/19/2016 1131   VLDL 22.0 01/19/2016 1131   LDLCALC 130 (H) 01/19/2016 1131    Lab Results  Component Value Date   AST 22 09/07/2016   Lab Results  Component Value Date   ALT 18 09/07/2016     Assessment & Plan:    1. Balanitis  - schedule circumcision - dicussed how procedure is performed in the office- explained what to expect after the procedure; such as penile swelling, penile pain, bleeding, infection, adhesions and penile injury  - patient instructed to discontinue his daily 81 mg ASA  2. Hypogonadism:     -Not addressed at this visit  3. BPH with LUTS  - Not addressed at this visit  4. Erectile dysfunction:     - patient advised not to have sexual intercourse until he fully recovers from the circumcision   Return for schedule in office circumcision.  These notes generated with voice recognition software. I apologize for typographical errors.  Zara Council, [facility] [address], [place], [place] [address] [phone]

## 2016-10-13 ENCOUNTER — Encounter: Payer: Self-pay | Admitting: Podiatry

## 2016-10-13 ENCOUNTER — Ambulatory Visit (INDEPENDENT_AMBULATORY_CARE_PROVIDER_SITE_OTHER): Payer: Medicaid Other | Admitting: Podiatry

## 2016-10-13 DIAGNOSIS — B351 Tinea unguium: Secondary | ICD-10-CM | POA: Diagnosis not present

## 2016-10-13 DIAGNOSIS — E0843 Diabetes mellitus due to underlying condition with diabetic autonomic (poly)neuropathy: Secondary | ICD-10-CM

## 2016-10-13 DIAGNOSIS — L603 Nail dystrophy: Secondary | ICD-10-CM

## 2016-10-13 DIAGNOSIS — L608 Other nail disorders: Secondary | ICD-10-CM

## 2016-10-13 DIAGNOSIS — M79676 Pain in unspecified toe(s): Secondary | ICD-10-CM

## 2016-10-13 DIAGNOSIS — M79609 Pain in unspecified limb: Principal | ICD-10-CM

## 2016-10-14 NOTE — Progress Notes (Signed)
SUBJECTIVE Patient with a history of diabetes mellitus presents to office today complaining of elongated, thickened nails. Pain while ambulating in shoes. Patient is unable to trim their own nails.   No Known Allergies  OBJECTIVE General Patient is awake, alert, and oriented x 3 and in no acute distress. Derm Skin is dry and supple bilateral. Negative open lesions or macerations. Remaining integument unremarkable. Nails are tender, long, thickened and dystrophic with subungual debris, consistent with onychomycosis, 1-5 bilateral. No signs of infection noted. Vasc  DP and PT pedal pulses palpable bilaterally. Temperature gradient within normal limits.  Neuro Epicritic and protective threshold sensation diminished bilaterally.  Musculoskeletal Exam No symptomatic pedal deformities noted bilateral. Muscular strength within normal limits.  ASSESSMENT 1. Diabetes Mellitus w/ peripheral neuropathy 2. Onychomycosis of nail due to dermatophyte bilateral 3. Pain in foot bilateral  PLAN OF CARE 1. Patient evaluated today. 2. Instructed to maintain good pedal hygiene and foot care. Stressed importance of controlling blood sugar.  3. Mechanical debridement of nails 1-5 bilaterally performed using a nail nipper. Filed with dremel without incident.  4. Return to clinic in 3 mos.     Brent M. Evans, DPM [facility]  Dr. Brent M. Evans, DPM   [address]                                        [place], [place] [address]                Office [phone]  Fax [phone]    

## 2016-10-16 ENCOUNTER — Other Ambulatory Visit: Payer: Self-pay | Admitting: Family Medicine

## 2016-10-31 ENCOUNTER — Ambulatory Visit: Payer: Medicaid Other

## 2016-11-03 ENCOUNTER — Ambulatory Visit: Payer: Medicaid Other

## 2016-11-07 ENCOUNTER — Ambulatory Visit: Payer: Medicaid Other

## 2016-11-07 ENCOUNTER — Ambulatory Visit (INDEPENDENT_AMBULATORY_CARE_PROVIDER_SITE_OTHER): Payer: Medicaid Other | Admitting: Family Medicine

## 2016-11-07 ENCOUNTER — Encounter: Payer: Self-pay | Admitting: Family Medicine

## 2016-11-07 DIAGNOSIS — I10 Essential (primary) hypertension: Secondary | ICD-10-CM

## 2016-11-07 DIAGNOSIS — E119 Type 2 diabetes mellitus without complications: Secondary | ICD-10-CM

## 2016-11-07 DIAGNOSIS — G479 Sleep disorder, unspecified: Secondary | ICD-10-CM | POA: Diagnosis not present

## 2016-11-07 NOTE — Assessment & Plan Note (Signed)
Patient continues to have sleeping difficulty. Suspect related to anxiety. Discussed starting on antianxiety medicine such as Zoloft or BuSpar. Patient was hesitant at this time. He is provided with information on these 2 medications. When he makes a decision he will contact us. He'll continue trazodone as needed for sleep. Continue to monitor.

## 2016-11-07 NOTE — Assessment & Plan Note (Signed)
Well controlled. Continue current medications  

## 2016-11-07 NOTE — Progress Notes (Signed)
Pre visit review using our clinic review tool, if applicable. No additional management support is needed unless otherwise documented below in the visit note. 

## 2016-11-07 NOTE — Progress Notes (Signed)
  [name], MD Phone: [phone]  Michael Montgomery is a 57 y.o. male who presents today for same-day visit.  Diabetes: Checking sugars and they are typically in the 120s. Taking metformin. No polyuria or hypoglycemia.  Hypertension: Not checking at home. Taking lisinopril and amlodipine. No chest pain, shortness breath, or edema. He has been exercising by lifting weights and riding a bike at the gym.  Patient does note continued issues with sleep and anxiety. Notes he'll go to sleep at 9 or 10 PM and he gets 3-4 hours asleep before waking up. Does take the trazodone which is beneficial to get that amount asleep though wakes up. Starts thinking about things and can't he can get his mind to stop when he wakes up. Mostly reads prior to bed. Has been on Ambien with little benefit in the past. No depression.  PMH: Former smoker   ROS see history of present illness  Objective  Physical Exam Vitals:   11/07/16 1435  BP: 138/80  Pulse: 100  Temp: 98.4 F (36.9 C)    BP Readings from Last 3 Encounters:  11/07/16 138/80  10/10/16 (!) 161/91  10/04/16 (!) 144/82   Wt Readings from Last 3 Encounters:  11/07/16 185 lb 9.6 oz (84.2 kg)  10/10/16 186 lb 4.8 oz (84.5 kg)  10/04/16 186 lb 14.4 oz (84.8 kg)    Physical Exam  Constitutional: No distress.  Cardiovascular: Normal rate, regular rhythm and normal heart sounds.   Pulmonary/Chest: Effort normal and breath sounds normal.  Musculoskeletal: He exhibits no edema.  Neurological: He is alert.  Skin: Skin is warm and dry. He is not diaphoretic.  Psychiatric:  Mood anxious, affect normal     Assessment/Plan: Please see individual problem list.  Essential hypertension At goal. Continue current medications.  Diabetes ([place]) We'll controlled. Continue current medications.  Sleeping difficulty Patient continues to have sleeping difficulty. Suspect related to anxiety. Discussed starting on antianxiety medicine such as  Zoloft or BuSpar. Patient was hesitant at this time. He is provided with information on these 2 medications. When he makes a decision he will contact us. He'll continue trazodone as needed for sleep. Continue to monitor.   [name], MD [facility]

## 2016-11-07 NOTE — Assessment & Plan Note (Signed)
At goal. Continue current medications. 

## 2016-11-07 NOTE — Patient Instructions (Signed)
Nice to see you. Please review the information for BuSpar and Zoloft to see if you would like to try one of these for your anxiety. I think treating your anxiety would help with your sleep. Please continue to work on diet and exercise.

## 2016-11-09 ENCOUNTER — Ambulatory Visit: Payer: Medicaid Other | Admitting: Physical Therapy

## 2016-11-14 ENCOUNTER — Ambulatory Visit: Payer: Medicaid Other

## 2016-11-15 ENCOUNTER — Ambulatory Visit: Payer: Medicaid Other | Attending: Neurology | Admitting: Physical Therapy

## 2016-11-15 ENCOUNTER — Encounter: Payer: Self-pay | Admitting: Physical Therapy

## 2016-11-15 DIAGNOSIS — R2681 Unsteadiness on feet: Secondary | ICD-10-CM | POA: Insufficient documentation

## 2016-11-15 DIAGNOSIS — M6281 Muscle weakness (generalized): Secondary | ICD-10-CM | POA: Insufficient documentation

## 2016-11-15 NOTE — Therapy (Signed)
[place] MAIN [facility] SERVICES [address] [place], [place], [address] Phone: [phone]   Fax:  [phone]  Physical Therapy Treatment  Patient Details  Name: MAKHAI OUTING MRN: [id number] Date of Birth: 02-22-1961 Referring Provider: Jennings Books MD  Encounter Date: 11/15/2016      PT End of Session - 11/15/16 0836    Visit Number 2   Number of Visits 24   Date for PT Re-Evaluation 12/27/16   Authorization Type 1st visit out of 3 authorized for Medicaid   PT Start Time 0835   PT Stop Time 0915   PT Time Calculation (min) 40 min   Equipment Utilized During Treatment Gait belt   Activity Tolerance Patient tolerated treatment well   Behavior During Therapy [facility] for tasks assessed/performed      Past Medical History:  Diagnosis Date  . Cataract march, april   bilateral removed   . Chickenpox   . Diabetes mellitus without complication (HCC)    metformin   . Diabetic amyotrophy [facility])    sees dr at [facility]  . Gastric ulcer   . Heart murmur    When he was younger, no recent issues  . High blood pressure    controlled with meds  . Hypercholesteremia    controlled with medication  . Knee injury    left  . Motion sickness    boats    Past Surgical History:  Procedure Laterality Date  . CATARACT EXTRACTION W/PHACO Right 02/01/2016   Procedure: CATARACT EXTRACTION PHACO AND INTRAOCULAR LENS PLACEMENT ([place]) right;  Surgeon: Ronnell Freshwater, MD;  Location: [place];  Service: Ophthalmology;  Laterality: Right;  DIABETIC - oral meds  . CATARACT EXTRACTION W/PHACO Left 03/07/2016   Procedure: CATARACT EXTRACTION PHACO AND INTRAOCULAR LENS PLACEMENT ([facility]);  Surgeon: Ronnell Freshwater, MD;  Location: [place];  Service: Ophthalmology;  Laterality: Left;  DIABETIC - oral meds   . EYE SURGERY      There were no vitals filed for this visit.      Subjective Assessment - 11/15/16 0834    Subjective  Pt states he is doing okay on this date and denies pain. He states he does have numbness due to the neuropathy and has fallen 3 times since Christmas. He states most of his falls were while he was shifting weight. He goes to the gym 4x per week and mainly does upper body exercises, but does sometimes ride the bike for 30 minutes.   Pertinent History Clinical Impairments affecting Rehab: young, quick onset of muscle weakness, mutliple falls   Limitations Sitting   How long can you sit comfortably? 4 hours   How long can you stand comfortably? 2 hours   How long can you walk comfortably? Able to walk long distances just slowly, will limite himself due to fear of falling   Patient Stated Goals Increase strength in your legs.    Currently in Pain? No/denies   Pain Score 0-No pain   Pain Onset --   Multiple Pain Sites No        Therapeutic exercise: NuStep 5 minutes Leg press 100 lbs 20x4 Heel raises on leg press 75 lbs 20x4 Hooklying abduction and external rotation with resistance 20x2 Sidelying straight leg hip abduction with 2 lb weight 20x2 (BLE) Sidelying bent leg hip abduction with 2 lb weight 20x2 (BLE) Sidelying hip flexion and extension with 2 lb weight 10x2 (BLE) Hip extension and abduction on matrix machine  7.5 lbs 10x2 BLE   Patient required rest breaks during hip exercises due to muscle weakness and fatigue.          PT Education - 11/15/16 [phone]    Education provided Yes   Education Details HEP of hooklying hip abduction and external rotation, sidelying hip abd and flexion   Person(s) Educated Patient   Methods Explanation   Comprehension Verbalized understanding             PT Long Term Goals - 11/15/16 [id number]      PT LONG TERM GOAL #1   Title Patient will be independent in HEP in order to increase patient's ability to maintain gains achieved in therapy and assist with return to PLOF.    Time 6   Period Weeks   Status On-going     PT LONG TERM GOAL #2    Title Patient will increase overall strength to 4/5 in LLE in order to complete transfers and ambulation safely with decreased risk for falls.   Time 6   Period Weeks   Status On-going     PT LONG TERM GOAL #3   Title Patient will decrease the time it takes to perform 5 times sit to stand to <10 seconds without UE use in order to decrease his risk for falls.     Time 6   Period Weeks   Status On-going     PT LONG TERM GOAL #4   Title Patient will increase his 10 m walk test time to >1.2 m/s in order to be a limited community ambulator at decreased risk for falls.    Time 6   Period Weeks   Status On-going     PT LONG TERM GOAL #5   Title Patient will report worst pain <4 points on the VAS in lower extremity in order to allow patient to perform ADLs and leisure activities with reduced symptoms.    Time 6   Period Weeks   Status Achieved     PT LONG TERM GOAL #6   Title Patient will improve TUG scores to under 14 sec to decrease fall risk and to more safely ambulate to the bathroom   Time 6   Period Weeks   Status On-going               Plan - 11/15/16 0914    Clinical Impression Statement Patient required frequent rest breaks during hip exercises and required minimal cueing to maintain the correct position. Patient demonstrated muscle weakness and fatigue and will continue to benefit from skilled therapy to increase muscular strength and endurance.   Rehab Potential Good   Clinical Impairments Affecting Rehab Potential Postive Factors: young, motivated; Negative Factors: falls, symptoms since January, extreme muscle weakness; Clinical Presentation: evolving due to possible progression of symptoms, extreme muscle weakness, and multiple falls   PT Frequency 2x / week   PT Duration 6 weeks   PT Treatment/Interventions ADLs/Self Care Home Management;Aquatic Therapy;Biofeedback;Cryotherapy;Electrical Stimulation;Moist Heat;Traction;DME Instruction;Gait training;Stair  training;Functional mobility training;Therapeutic activities;Therapeutic exercise;Balance training;Manual techniques;Patient/family education;Neuromuscular re-education;Passive range of motion;Energy conservation   PT Next Visit Plan Balance activities, progress CKC LE strengthening ensuring LLE involvment, progress leg press, SLR   PT Home Exercise Plan HEP maintained   Consulted and Agree with Plan of Care Patient      Patient will benefit from skilled therapeutic intervention in order to improve the following deficits and impairments:  Abnormal gait, Decreased activity tolerance, Decreased balance, Decreased mobility, Decreased range of motion,  Decreased strength, Impaired flexibility, Postural dysfunction, Improper body mechanics, Impaired sensation, Pain, Decreased safety awareness, Decreased knowledge of use of DME, Decreased endurance  Visit Diagnosis: Muscle weakness (generalized)  Unsteadiness on feet     Problem List Patient Active Problem List   Diagnosis Date Noted  . Mood swings ([place]) 09/07/2016  . Allergic rhinitis 09/07/2016  . Poor dentition 09/07/2016  . Hyperlipidemia 05/24/2016  . Loose stools 05/24/2016  . Sleeping difficulty 04/21/2016  . CIDP (chronic inflammatory demyelinating polyneuropathy) ([place]) 04/11/2016  . Erectile dysfunction of organic origin 03/17/2016  . Phimosis 03/17/2016  . BPH with obstruction/lower urinary tract symptoms 03/17/2016  . Gingivitis 03/01/2016  . Diabetes ([place]) 02/07/2016  . Erectile dysfunction 02/07/2016  . Muscle wasting and atrophy, not elsewhere classified, unspecified lower leg 01/21/2016  . Left leg weakness 01/21/2016  . Essential hypertension 01/19/2016  . Cataracts, bilateral 01/19/2016  . Left knee pain 01/19/2016  Alanson Puls, PT, DPT  This entire session was performed under direct supervision and direction of a licensed therapist/therapist assistant . I have personally read, edited and approve of the note  as written. Betsy Coder, SPT Betsy Coder 11/15/2016, 9:29 AM  [place] MAIN [facility] SERVICES [address] [place], [place], [address] Phone: [phone]   Fax:  [phone]  Name: CHAUNCE ORLOFF MRN: [id number] Date of Birth: Dec 01, 1960

## 2016-11-21 ENCOUNTER — Ambulatory Visit (INDEPENDENT_AMBULATORY_CARE_PROVIDER_SITE_OTHER): Payer: Medicaid Other | Admitting: Urology

## 2016-11-21 ENCOUNTER — Telehealth: Payer: Self-pay | Admitting: Sports Medicine

## 2016-11-21 ENCOUNTER — Encounter: Payer: Self-pay | Admitting: Urology

## 2016-11-21 VITALS — BP 162/82 | HR 108 | Ht 70.0 in | Wt 185.0 lb

## 2016-11-21 DIAGNOSIS — N471 Phimosis: Secondary | ICD-10-CM

## 2016-11-21 NOTE — Progress Notes (Signed)
   11/21/16  CC:  Chief Complaint  Patient presents with  . Circumcision    HPI: 56 year old male who presents today for office circumcision.  He was previously seen and evaluated by Zara Council for phimosis, difficulty retracting his foreskin. He desires circumcision.  Risks and benefits were reviewed again today in detail. Specifically discussed included risk of bleeding, infection, undesirable cosmesis, urethral injury, pain, amongst others. All questions were answered.  Blood pressure (!) 162/82, pulse (!) 108, height 5\' 10"  (1.778 m), weight 185 lb (83.9 kg). NED. A&Ox3.   No respiratory distress   Abd soft, NT, ND Normal phallus with bilateral descended testicles  Circumcision Procedure   Informed consistent obtained.  Risks discussed.  He was then prepped and draped in a sterile fashion.    Pre-Procedure: -A dorsal penile and ring block were administered using 25 cc 1% lidocaine.  The surgical site was then tested and adequate anesthesia was achieved.    Procedure: - Forceps were used to stretch the phimotic foreskin and expose the coronal area exposing the glans and meatus. -Two circumferential incision were made on the penile skin, one in the mid shaft and another approximately 1 cm proximal to the coronal margin using a blade. -The foreskin was then removed in a sleeve-like fashion with care taken to avoid injury to the urethra.  - Bleeding points were cauterized and adequate hemostasis was achieved - Skin margins were reapproximated with interrupted 3-0 chromic catgut sutures including a few interrupted frenular sutures -A dressing of Xeroform and gauze was applied.     Post-Procedure: -RTC in 4 weeks for wound check   Assessment/ Plan:  1. Phimosis- S/p circumcision, uncomplicated  Hollice Espy, MD

## 2016-11-21 NOTE — Telephone Encounter (Signed)
Pt called in stating he has a bill for $260.00 and he said that this was suppose to be send to medicaid. I told pt that he didn't have a medicaid card on file for him until 09/2016. Pt stated he had medicaid in 06/2016. Wants [place] to re file this bill to [facility]

## 2016-11-22 ENCOUNTER — Ambulatory Visit: Payer: Medicaid Other | Admitting: Physical Therapy

## 2016-11-23 ENCOUNTER — Telehealth: Payer: Self-pay

## 2016-11-23 NOTE — Telephone Encounter (Signed)
Pt called stating he has taken off his bandage from circumcision and is having some bleeding at incision site. Pt stated that the blood is just oozing like not pouring blood. Reinforced with pt to apply pressure and bleeding should stop. Pt voiced understanding.

## 2016-11-24 ENCOUNTER — Other Ambulatory Visit: Payer: Self-pay | Admitting: Family Medicine

## 2016-11-28 ENCOUNTER — Encounter: Payer: Self-pay | Admitting: Dietician

## 2016-11-28 ENCOUNTER — Encounter: Payer: Medicaid Other | Attending: Family Medicine | Admitting: Dietician

## 2016-11-28 VITALS — Ht 70.0 in | Wt 182.7 lb

## 2016-11-28 DIAGNOSIS — Z713 Dietary counseling and surveillance: Secondary | ICD-10-CM | POA: Diagnosis not present

## 2016-11-28 DIAGNOSIS — E1144 Type 2 diabetes mellitus with diabetic amyotrophy: Secondary | ICD-10-CM

## 2016-11-28 DIAGNOSIS — E119 Type 2 diabetes mellitus without complications: Secondary | ICD-10-CM | POA: Insufficient documentation

## 2016-11-28 DIAGNOSIS — Z6826 Body mass index (BMI) 26.0-26.9, adult: Secondary | ICD-10-CM | POA: Insufficient documentation

## 2016-11-28 NOTE — Progress Notes (Signed)

## 2016-11-29 ENCOUNTER — Ambulatory Visit: Payer: Medicaid Other | Admitting: Physical Therapy

## 2016-12-01 ENCOUNTER — Telehealth: Payer: Self-pay | Admitting: Family Medicine

## 2016-12-01 ENCOUNTER — Other Ambulatory Visit: Payer: BLUE CROSS/BLUE SHIELD

## 2016-12-01 NOTE — Telephone Encounter (Signed)
Pt called wanting to know does he need any shots to go on a cruise to [place] and [place]? Please advise? Pt states he did received documents that's needed.   Call pt @ [phone]. Thank you!

## 2016-12-01 NOTE — Telephone Encounter (Signed)
Please advise 

## 2016-12-02 NOTE — Telephone Encounter (Signed)
Please let the patient know that there are recommended vaccines for those countries. We do not provide all of them at our office and I would suggest contacting the health Department to arrange to receive the appropriate vaccinations. Thanks.

## 2016-12-05 ENCOUNTER — Ambulatory Visit: Payer: Medicaid Other | Admitting: Physical Therapy

## 2016-12-05 ENCOUNTER — Encounter: Payer: Self-pay | Admitting: Dietician

## 2016-12-05 ENCOUNTER — Encounter: Payer: Medicaid Other | Admitting: Dietician

## 2016-12-05 ENCOUNTER — Ambulatory Visit: Payer: BLUE CROSS/BLUE SHIELD | Admitting: Urology

## 2016-12-05 VITALS — Wt 184.5 lb

## 2016-12-05 DIAGNOSIS — E1144 Type 2 diabetes mellitus with diabetic amyotrophy: Secondary | ICD-10-CM

## 2016-12-05 DIAGNOSIS — Z713 Dietary counseling and surveillance: Secondary | ICD-10-CM | POA: Diagnosis not present

## 2016-12-05 NOTE — Telephone Encounter (Signed)
Patient notified

## 2016-12-05 NOTE — Patient Instructions (Signed)

## 2016-12-07 ENCOUNTER — Ambulatory Visit: Payer: Medicaid Other | Admitting: Physical Therapy

## 2016-12-08 ENCOUNTER — Ambulatory Visit (INDEPENDENT_AMBULATORY_CARE_PROVIDER_SITE_OTHER): Payer: Medicaid Other | Admitting: Family Medicine

## 2016-12-08 ENCOUNTER — Encounter: Payer: Self-pay | Admitting: Family Medicine

## 2016-12-08 VITALS — BP 118/80 | HR 91 | Temp 99.2°F | Wt 183.8 lb

## 2016-12-08 DIAGNOSIS — I10 Essential (primary) hypertension: Secondary | ICD-10-CM | POA: Diagnosis not present

## 2016-12-08 DIAGNOSIS — E785 Hyperlipidemia, unspecified: Secondary | ICD-10-CM

## 2016-12-08 DIAGNOSIS — Z23 Encounter for immunization: Secondary | ICD-10-CM

## 2016-12-08 DIAGNOSIS — E119 Type 2 diabetes mellitus without complications: Secondary | ICD-10-CM | POA: Diagnosis not present

## 2016-12-08 DIAGNOSIS — G479 Sleep disorder, unspecified: Secondary | ICD-10-CM | POA: Diagnosis not present

## 2016-12-08 LAB — COMPREHENSIVE METABOLIC PANEL
ALT: 17 U/L (ref 0–53)
AST: 18 U/L (ref 0–37)
Albumin: 4.6 g/dL (ref 3.5–5.2)
Alkaline Phosphatase: 89 U/L (ref 39–117)
BUN: 12 mg/dL (ref 6–23)
CO2: 33 mEq/L — ABNORMAL HIGH (ref 19–32)
Calcium: 9.8 mg/dL (ref 8.4–10.5)
Chloride: 100 mEq/L (ref 96–112)
Creatinine, Ser: 0.86 mg/dL (ref 0.40–1.50)
GFR: 119.26 mL/min (ref >60.00)
Glucose, Bld: 76 mg/dL (ref 70–99)
Potassium: 4.5 mEq/L (ref 3.5–5.1)
Sodium: 136 mEq/L (ref 135–145)
Total Bilirubin: 0.4 mg/dL (ref 0.2–1.2)
Total Protein: 8 g/dL (ref 6.0–8.3)

## 2016-12-08 LAB — HEMOGLOBIN A1C: Hgb A1c MFr Bld: 5.1 % (ref 4.6–6.5)

## 2016-12-08 LAB — LDL CHOLESTEROL, DIRECT: Direct LDL: 35 mg/dL

## 2016-12-08 MED ORDER — TRAZODONE HCL 50 MG PO TABS: 50.0000 mg | ORAL_TABLET | Freq: Every evening | ORAL | 2 refills | Status: DC | PRN

## 2016-12-08 MED ORDER — METFORMIN HCL 500 MG PO TABS: ORAL_TABLET | ORAL | 3 refills | Status: DC

## 2016-12-08 MED ORDER — ATORVASTATIN CALCIUM 40 MG PO TABS: 40.0000 mg | ORAL_TABLET | Freq: Every day | ORAL | 3 refills | Status: DC

## 2016-12-08 MED ORDER — LISINOPRIL 5 MG PO TABS: 2.5000 mg | ORAL_TABLET | Freq: Every day | ORAL | 3 refills | Status: DC

## 2016-12-08 MED ORDER — AMLODIPINE BESYLATE 10 MG PO TABS: 10.0000 mg | ORAL_TABLET | Freq: Every day | ORAL | 3 refills | Status: DC

## 2016-12-08 NOTE — Progress Notes (Signed)
  [name], MD Phone: [phone]  Michael Montgomery is a 56 y.o. male who presents today for f/u.  HYPERTENSION Disease Monitoring: Blood pressure range-not checking Chest pain- no      Dyspnea- no Medications: Compliance- taking lisinopril, amlodipine   Edema- no  DIABETES Disease Monitoring: Blood Sugar ranges-80-136 in am Polyuria/phagia/dipsia- no      optho- due in several months Medications: Compliance- taking metformin Hypoglycemic symptoms- no He has been limiting his carbs and watching his diet fairly closely. Has not been exercising given recent circumcision though will get back to this once they clear him for it.  HYPERLIPIDEMIA Disease Monitoring: See symptoms for Hypertension Medications: Compliance- taking lipitor Right upper quadrant pain- no  Muscle aches- no  Sleep is significantly improved. Taking trazodone nightly. Getting about 6 hours of sleep nightly.  PMH: Former smoker   ROS see history of present illness  Objective  Physical Exam Vitals:   12/08/16 0950  BP: 118/80  Pulse: 91  Temp: 99.2 F (37.3 C)    BP Readings from Last 3 Encounters:  12/08/16 118/80  11/21/16 (!) 162/82  11/07/16 138/80   Wt Readings from Last 3 Encounters:  12/08/16 183 lb 12.8 oz (83.4 kg)  12/05/16 184 lb 8 oz (83.7 kg)  11/28/16 182 lb 11.2 oz (82.9 kg)    Physical Exam  Constitutional: No distress.  Cardiovascular: Normal rate, regular rhythm and normal heart sounds.   Pulmonary/Chest: Effort normal and breath sounds normal.  Musculoskeletal: He exhibits no edema.  Neurological: He is alert.  Skin: Skin is warm and dry. He is not diaphoretic.     Assessment/Plan: Please see individual problem list.  Essential hypertension At goal. Continue current medications.  Diabetes ([place]) Much improved. Continue metformin.  Hyperlipidemia Tolerating Lipitor. Check labs as below.  Sleeping difficulty Much improved with trazodone. Refills  provided.   Orders Placed This Encounter  Procedures  . Tdap vaccine greater than or equal to 7yo IM  . Pneumococcal polysaccharide vaccine 23-valent greater than or equal to 2yo subcutaneous/IM  . Direct LDL  . HgB A1c  . Comp Met (CMET)    Meds ordered this encounter  Medications  . traZODone (DESYREL) 50 MG tablet    Sig: Take 1 tablet (50 mg total) by mouth at bedtime as needed.    Dispense:  90 tablet    Refill:  2  . metFORMIN (GLUCOPHAGE) 500 MG tablet    Sig: TAKE 2 TABLETS IN MORNING AND 1 TABLET AT NIGHT    Dispense:  180 tablet    Refill:  3  . lisinopril (PRINIVIL,ZESTRIL) 5 MG tablet    Sig: Take 0.5 tablets (2.5 mg total) by mouth daily.    Dispense:  45 tablet    Refill:  3  . atorvastatin (LIPITOR) 40 MG tablet    Sig: Take 1 tablet (40 mg total) by mouth daily.    Dispense:  90 tablet    Refill:  3  . amLODipine (NORVASC) 10 MG tablet    Sig: Take 1 tablet (10 mg total) by mouth daily.    Dispense:  90 tablet    Refill:  3    # Healthcare maintenance: Tdap and Pneumovax needed. Both were given to the patient today. He had coverage information already with prices of both vaccinations.   [name], MD [place]

## 2016-12-08 NOTE — Assessment & Plan Note (Signed)
At goal. Continue current medications. 

## 2016-12-08 NOTE — Assessment & Plan Note (Signed)
Tolerating Lipitor. Check labs as below.

## 2016-12-08 NOTE — Progress Notes (Signed)
Pre visit review using our clinic review tool, if applicable. No additional management support is needed unless otherwise documented below in the visit note. 

## 2016-12-08 NOTE — Assessment & Plan Note (Signed)
Much improved. Continue metformin.

## 2016-12-08 NOTE — Patient Instructions (Signed)
Nice to see you. I am glad your sleep is better. We'll refill your trazodone. Please continue your hypertension, diabetes, and cholesterol medications.

## 2016-12-08 NOTE — Assessment & Plan Note (Signed)
Much improved with trazodone. Refills provided.

## 2016-12-12 ENCOUNTER — Other Ambulatory Visit: Payer: Self-pay | Admitting: Family Medicine

## 2016-12-14 ENCOUNTER — Ambulatory Visit: Payer: Medicaid Other | Admitting: Urology

## 2016-12-17 NOTE — Progress Notes (Signed)
9:41 AM   Michael Montgomery [birth year] [id number]  Referring provider: Leone Haven, MD [address] [place], [place] [address]  Chief Complaint  Patient presents with  . Wound Check    4 week follow up wound check Circ    HPI: 56 yo AAM who presents today for a recheck on a circumcision performed on 11/21/2016 for balanitis.  Background history Patient is a 56 year old diabetic AAM with hypogonadism, ED and BPH with LUTS who presents today for consultation for a possible circumcision.  Patient is having a more difficult time retracting the foreskin over the last several weeks.  He is also experiencing increase irritation and difficulty cleansing the head of the penis.  His most recent HbgA1C was 5.6% on 09/07/2016.  He does take a daily 81 mg ASA.  He has not had any changes in his urinary symptoms.  He has not had penile swelling or discharge.  He has not had any recent fevers, chills, nausea or vomiting.  He has no history of bleeding disorders.    Patient underwent an uncomplicated circumcision on 11/21/2016 with Dr. Erlene Quan.  His post procedural course was as expected and uneventful.    Today, he is not experiencing any penile pain, bleeding, swelling or discharge.  He has not had fevers, chills, nausea or vomiting.      Hypogonadism Patient is experiencing a decrease in libido, a lack of energy, a decrease in strength and erections being less strong.  This is indicated by his responses to the ADAM questionnaire.  He is no longer having spontaneous erections at night.  He does not have sleep apnea.   His current testosterone level was 764 ng/dL on 07/28/2016.  He is currently managing his hypogonadism with Clomid 50 mg, 1/2 daily.      Erectile dysfunction His SHIM score is 12, which is mild to moderate erectile dysfunction.  His previous SHIM was 7.   He has been having difficulty with erections for 1 year.   His major complaint is achieving and maintaining an  erection.  His libido is diminished.   His risk factors for ED are age, BPH, smoking, diabetes, HTN and HLD.  He denies any painful erections or curvatures with his erections.   He is no longer having spontaneous erections.   He has tried Viagra and Cialis in the past without success.   He has had good success with the Trimix injections.   BPH WITH LUTS His IPSS score today is 1, which is mild lower urinary tract symptomatology.  He is delighted with his quality life due to his urinary symptoms.   His major complaint today nocturia x 1.  He has had these symptoms for several years.  He denies any dysuria, hematuria or suprapubic pain.  He also denies any recent fevers, chills, nausea or vomiting.  He does not have a family history of PCa.   PMH: Past Medical History:  Diagnosis Date  . Cataract march, april   bilateral removed   . Chickenpox   . Diabetes mellitus without complication (HCC)    metformin   . 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 Gastric ulcer   . Heart murmur    When he was younger, no recent issues  . High blood pressure    controlled with meds  . Hypercholesteremia    controlled with medication  . Knee injury    left  . Motion sickness  boats    Surgical History: Past Surgical History:  Procedure Laterality Date  . CATARACT EXTRACTION W/PHACO Right 02/01/2016   Procedure: CATARACT EXTRACTION PHACO AND INTRAOCULAR LENS PLACEMENT ([place]) right;  Surgeon: Ronnell Freshwater, MD;  Location: [place];  Service: Ophthalmology;  Laterality: Right;  DIABETIC - oral meds  . CATARACT EXTRACTION W/PHACO Left 03/07/2016   Procedure: CATARACT EXTRACTION PHACO AND INTRAOCULAR LENS PLACEMENT ([facility]);  Surgeon: Ronnell Freshwater, MD;  Location: [place];  Service: Ophthalmology;  Laterality: Left;  DIABETIC - oral meds   . circumscision    . EYE SURGERY      Home Medications:  Allergies as of 12/19/2016   No Known Allergies       Medication List       Accurate as of 12/19/16  9:41 AM. Always use your most recent med list.          ACCU-CHEK AVIVA PLUS test strip Generic drug:  glucose blood USE AS DIRECTED   ACCU-CHEK SOFTCLIX LANCETS lancets USE AS DIRECTED   AMITIZA 24 MCG capsule Generic drug:  lubiprostone Take 24 mcg by mouth 2 (two) times daily.   amLODipine 10 MG tablet Commonly known as:  NORVASC Take 1 tablet (10 mg total) by mouth daily.   atorvastatin 40 MG tablet Commonly known as:  LIPITOR Take 1 tablet (40 mg total) by mouth daily.   fluticasone 50 MCG/ACT nasal spray Commonly known as:  FLONASE Place 2 sprays into both nostrils daily.   gabapentin 300 MG capsule Commonly known as:  NEURONTIN Take 1 capsule by mouth 2 (two) times daily. Take 1 cap in AM and 2 caps at bedtime   lisinopril 5 MG tablet Commonly known as:  PRINIVIL,ZESTRIL Take 0.5 tablets (2.5 mg total) by mouth daily.   loratadine 10 MG tablet Commonly known as:  CLARITIN Take 1 tablet (10 mg total) by mouth daily.   metFORMIN 500 MG tablet Commonly known as:  GLUCOPHAGE TAKE 2 TABLETS IN MORNING AND 1 TABLET AT NIGHT   multivitamin capsule Take 1 capsule by mouth daily.   nystatin-triamcinolone ointment Commonly known as:  MYCOLOG Apply 1 application topically 2 (two) times daily.   PROAIR HFA 108 (90 Base) MCG/ACT inhaler Generic drug:  albuterol 2 INH INHALATION EVERY FOUR TO SIX HOURS AS NEEDED   traZODone 50 MG tablet Commonly known as:  DESYREL Take 1 tablet (50 mg total) by mouth at bedtime as needed.       Allergies: No Known Allergies  Family History: Family History  Problem Relation Age of Onset  . Alcoholism Father   . Hyperlipidemia Father   . Hypertension Father   . Throat cancer Father     smoked but had stopped 20 yeras before dx  . Diabetes Mother   . Kidney disease Neg Hx   . Prostate cancer Neg Hx   . Colon cancer Neg Hx   . Colon polyps Neg Hx   . Esophageal cancer  Neg Hx   . Rectal cancer Neg Hx   . Stomach cancer Neg Hx     Social History:  reports that he has quit smoking. His smoking use included Cigars. He has quit using smokeless tobacco. He reports that he does not drink alcohol or use drugs.  ROS: UROLOGY Frequent Urination?: No Hard to postpone urination?: No Burning/pain with urination?: No Get up at night to urinate?: No Leakage of urine?: No Urine stream starts and stops?: No Trouble starting stream?: No  Do you have to strain to urinate?: No Blood in urine?: No Urinary tract infection?: No Sexually transmitted disease?: No Injury to kidneys or bladder?: No Painful intercourse?: No Weak stream?: No Erection problems?: No Penile pain?: No  Gastrointestinal Nausea?: No Vomiting?: No Indigestion/heartburn?: No Diarrhea?: No Constipation?: No  Constitutional Fever: No Night sweats?: No Weight loss?: No Fatigue?: No  Skin Skin rash/lesions?: No Itching?: No  Eyes Blurred vision?: No Double vision?: No  Ears/Nose/Throat Sore throat?: No Sinus problems?: No  Hematologic/Lymphatic Swollen glands?: No Easy bruising?: No  Cardiovascular Leg swelling?: No Chest pain?: No  Respiratory Cough?: No Shortness of breath?: No  Endocrine Excessive thirst?: No  Musculoskeletal Back pain?: No Joint pain?: No  Neurological Headaches?: No Dizziness?: No  Psychologic Depression?: No Anxiety?: No  Physical Exam: BP 129/78   Pulse 89   Ht 5\' 10"  (1.778 m)   Wt 181 lb 1.6 oz (82.1 kg)   BMI 25.99 kg/m   Constitutional: Well nourished. Alert and oriented, No acute distress. HEENT: [place] AT, moist mucus membranes. Trachea midline, no masses. Cardiovascular: No clubbing, cyanosis, or edema. Respiratory: Normal respiratory effort, no increased work of breathing. GI: Abdomen is soft, non tender, non distended, no abdominal masses. Liver and spleen not palpable.  No hernias appreciated.  Stool sample for occult  testing is not indicated.   GU: No CVA tenderness.  No bladder fullness or masses.  Patient with uncircumcised phallus. Foreskin easily retracted with balanitis.   Urethral meatus is patent.  No penile discharge. No penile lesions or rashes. Scrotum without lesions, cysts, rashes and/or edema.  Testicles are located scrotally bilaterally. No masses are appreciated in the testicles. Left and right epididymis are normal. Rectal: Deferred.   Skin: No rashes, bruises or suspicious lesions. Lymph: No cervical or inguinal adenopathy. Neurologic: Grossly intact, no focal deficits, moving all 4 extremities. Psychiatric: Normal mood and affect.  Laboratory Data: Lab Results  Component Value Date   WBC 5.4 04/11/2016   HGB 14.5 04/11/2016   HCT 40.3 07/28/2016   MCV 88.0 04/11/2016   PLT 211 04/11/2016    Lab Results  Component Value Date   CREATININE 0.86 12/08/2016     Lab Results  Component Value Date   TESTOSTERONE 764 07/28/2016    Lab Results  Component Value Date   HGBA1C 5.1 12/08/2016    Lab Results  Component Value Date   TSH 1.68 01/19/2016       Component Value Date/Time   CHOL 197 01/19/2016 1131   HDL 45.00 01/19/2016 1131   CHOLHDL 4 01/19/2016 1131   VLDL 22.0 01/19/2016 1131   LDLCALC 130 (H) 01/19/2016 1131    Lab Results  Component Value Date   AST 18 12/08/2016   Lab Results  Component Value Date   ALT 17 12/08/2016     Assessment & Plan:    1. Balanitis  - s/p circumcision - doing well  - follow up prn  2. Hypogonadism:     - RTC on 02/02/2017 for ADAM questionnaire and exam - labs (Morning testosterone (before 10 AM), LFT's, HCT to be drawn before appointment)  3. BPH with LUTS  - RTC on 02/02/2017 for I PSS questionnaire and exam - labs (PSA to be drawn before appointment)  4. Erectile dysfunction:     - cleared to resume sexual activity  - RTC on 02/02/2017 for SHIM questionnaire and exam    Return for keep appointment for labs  and office visit in April  2018.  These notes generated with voice recognition software. I apologize for typographical errors.  Zara Council, [facility] [address] [place], [place] [address] [phone]

## 2016-12-19 ENCOUNTER — Encounter: Payer: Self-pay | Admitting: Urology

## 2016-12-19 ENCOUNTER — Ambulatory Visit (INDEPENDENT_AMBULATORY_CARE_PROVIDER_SITE_OTHER): Payer: Medicaid Other | Admitting: Urology

## 2016-12-19 VITALS — BP 129/78 | HR 89 | Ht 70.0 in | Wt 181.1 lb

## 2016-12-19 DIAGNOSIS — N401 Enlarged prostate with lower urinary tract symptoms: Secondary | ICD-10-CM

## 2016-12-19 DIAGNOSIS — N481 Balanitis: Secondary | ICD-10-CM | POA: Diagnosis not present

## 2016-12-19 DIAGNOSIS — E291 Testicular hypofunction: Secondary | ICD-10-CM | POA: Diagnosis not present

## 2016-12-19 DIAGNOSIS — N138 Other obstructive and reflux uropathy: Secondary | ICD-10-CM | POA: Diagnosis not present

## 2016-12-19 DIAGNOSIS — N529 Male erectile dysfunction, unspecified: Secondary | ICD-10-CM | POA: Diagnosis not present

## 2016-12-22 ENCOUNTER — Ambulatory Visit: Payer: Medicaid Other | Admitting: Physical Therapy

## 2017-01-13 ENCOUNTER — Ambulatory Visit (INDEPENDENT_AMBULATORY_CARE_PROVIDER_SITE_OTHER): Payer: Medicaid Other | Admitting: Podiatry

## 2017-01-13 ENCOUNTER — Other Ambulatory Visit: Payer: Self-pay | Admitting: Family Medicine

## 2017-01-13 ENCOUNTER — Encounter: Payer: Self-pay | Admitting: Podiatry

## 2017-01-13 DIAGNOSIS — M79609 Pain in unspecified limb: Principal | ICD-10-CM

## 2017-01-13 DIAGNOSIS — M79676 Pain in unspecified toe(s): Secondary | ICD-10-CM | POA: Diagnosis not present

## 2017-01-13 DIAGNOSIS — E119 Type 2 diabetes mellitus without complications: Secondary | ICD-10-CM

## 2017-01-13 DIAGNOSIS — B351 Tinea unguium: Secondary | ICD-10-CM

## 2017-01-13 NOTE — Telephone Encounter (Signed)
Pt called requesting a refills on these. Please advise, thank you!

## 2017-01-13 NOTE — Progress Notes (Signed)
Complaint:  Visit Type: Patient returns to my office for continued preventative foot care services. Complaint: Patient states" my nails have grown long and thick and become painful to walk and wear shoes" Patient has been diagnosed with DM with no foot complications. The patient presents for preventative foot care services. No changes to ROS  Podiatric Exam: Vascular: dorsalis pedis and posterior tibial pulses are palpable bilateral. Capillary return is immediate. Temperature gradient is WNL. Skin turgor WNL  Sensorium: Normal Semmes Weinstein monofilament test. Normal tactile sensation bilaterally. Nail Exam: Pt has thick disfigured discolored nails with subungual debris noted bilateral entire nail hallux through fifth toenails Ulcer Exam: There is no evidence of ulcer or pre-ulcerative changes or infection. Orthopedic Exam: Muscle tone and strength are WNL. No limitations in general ROM. No crepitus or effusions noted. Foot type and digits show no abnormalities. Bony prominences are unremarkable. Skin: No Porokeratosis. No infection or ulcers  Diagnosis:  Onychomycosis, , Pain in right toe, pain in left toes  Treatment & Plan Procedures and Treatment: Consent by patient was obtained for treatment procedures. The patient understood the discussion of treatment and procedures well. All questions were answered thoroughly reviewed. Debridement of mycotic and hypertrophic toenails, 1 through 5 bilateral and clearing of subungual debris. No ulceration, no infection noted.  Return Visit-Office Procedure: Patient instructed to return to the office for a follow up visit 3 months for continued evaluation and treatment.    Gaylia Kassel DPM 

## 2017-01-16 ENCOUNTER — Encounter: Payer: Self-pay | Admitting: Dietician

## 2017-01-16 ENCOUNTER — Encounter: Payer: Medicaid Other | Attending: Family Medicine | Admitting: Dietician

## 2017-01-16 VITALS — BP 126/80 | Ht 70.0 in | Wt 184.7 lb

## 2017-01-16 DIAGNOSIS — Z6826 Body mass index (BMI) 26.0-26.9, adult: Secondary | ICD-10-CM | POA: Insufficient documentation

## 2017-01-16 DIAGNOSIS — E119 Type 2 diabetes mellitus without complications: Secondary | ICD-10-CM | POA: Insufficient documentation

## 2017-01-16 DIAGNOSIS — Z713 Dietary counseling and surveillance: Secondary | ICD-10-CM | POA: Diagnosis present

## 2017-01-16 DIAGNOSIS — E1144 Type 2 diabetes mellitus with diabetic amyotrophy: Secondary | ICD-10-CM

## 2017-01-16 NOTE — Progress Notes (Signed)

## 2017-01-17 ENCOUNTER — Telehealth: Payer: Self-pay | Admitting: Family Medicine

## 2017-01-17 NOTE — Telephone Encounter (Signed)
Pt called and want ed to speak with someone in regards to depression medication that was discussed at last visit, and would also like to know if he could take metamucil. Please advise, thank you!  Call pt @ [phone].

## 2017-01-17 NOTE — Telephone Encounter (Signed)
We will discuss both at his visit tomorrow.

## 2017-01-17 NOTE — Telephone Encounter (Signed)
Patients states there was a different medication you had mentioned. Scheduled patient to come in tomorrow in 30 min slot for depression/follow up. He was also wondering if it is ok for him to start on metamucil.

## 2017-01-17 NOTE — Telephone Encounter (Signed)
Please find out if he means the trazodone. I believe that is the only depression medication we have discussed and he is currently on it for sleep. Thanks.

## 2017-01-17 NOTE — Telephone Encounter (Signed)
Please advise, I did not see any depression medication being discussed In chart

## 2017-01-18 ENCOUNTER — Ambulatory Visit (INDEPENDENT_AMBULATORY_CARE_PROVIDER_SITE_OTHER): Payer: Medicaid Other | Admitting: Family Medicine

## 2017-01-18 ENCOUNTER — Encounter: Payer: Self-pay | Admitting: Family Medicine

## 2017-01-18 VITALS — BP 110/78 | HR 97 | Temp 98.5°F | Wt 186.6 lb

## 2017-01-18 DIAGNOSIS — G479 Sleep disorder, unspecified: Secondary | ICD-10-CM

## 2017-01-18 DIAGNOSIS — E119 Type 2 diabetes mellitus without complications: Secondary | ICD-10-CM

## 2017-01-18 DIAGNOSIS — R41840 Attention and concentration deficit: Secondary | ICD-10-CM | POA: Diagnosis not present

## 2017-01-18 DIAGNOSIS — I10 Essential (primary) hypertension: Secondary | ICD-10-CM

## 2017-01-18 DIAGNOSIS — K5900 Constipation, unspecified: Secondary | ICD-10-CM | POA: Diagnosis not present

## 2017-01-18 NOTE — Assessment & Plan Note (Signed)
Very well controlled. He'll continue metformin for now. Continue diet and exercise.

## 2017-01-18 NOTE — Progress Notes (Signed)
  [name], MD Phone: [phone]  Michael Montgomery is a 56 y.o. male who presents today for follow-up.  Patient notes continued intermittent issues with sleep. He notes the trazodone does help him get about 6-7 hours of sleep though he does note some mild drowsiness the next day. If he does not take any he wakes up at times through the night. He gets about 4 hours if he doesn't take it. Has some anxiety with worrying about money now that he is on disability. Though no other significant anxiety. No depression. No SI. Does report issues focusing and feels as though this is the biggest thing. He is going to go back to school and wants to make sure that he is able to focus when doing this.  Diabetes: Minimal polydipsia. No polyuria. No hypoglycemia. Sugars in the 100-165 range.  Hypertension: Notes it is well controlled at home in the 120s over 80s. No chest pain or shortness of breath.  Patient notes some issues with intermittent constipation. Has bowel movement every 2-3 days. Occasional sit down and just nothing will come out. No blood in his stool. No abdominal pain. He reports when he does have bowel movements are normal. He wants to know if he can take fiber supplementation.  PMH: Former smoker   ROS see history of present illness  Objective  Physical Exam Vitals:   01/18/17 1357 01/18/17 1418  BP: 140/88 110/78  Pulse: 97   Temp: 98.5 F (36.9 C)     BP Readings from Last 3 Encounters:  01/18/17 110/78  01/16/17 126/80  12/19/16 129/78   Wt Readings from Last 3 Encounters:  01/18/17 186 lb 9.6 oz (84.6 kg)  01/16/17 184 lb 11.2 oz (83.8 kg)  12/19/16 181 lb 1.6 oz (82.1 kg)    Physical Exam  Constitutional: No distress.  Cardiovascular: Normal rate, regular rhythm and normal heart sounds.   Pulmonary/Chest: Effort normal and breath sounds normal.  Abdominal: Soft. He exhibits no distension. There is no tenderness.  Musculoskeletal: He exhibits no edema.    Neurological: He is alert.  Skin: Skin is warm and dry. He is not diaphoretic.  Psychiatric: Mood and affect normal.     Assessment/Plan: Please see individual problem list.  Diabetes ([place]) Very well controlled. He'll continue metformin for now. Continue diet and exercise.  Essential hypertension Well-controlled on recheck. Continue current medications.  Sleeping difficulty Continues to have sleep difficulty. Some mild anxiety though nothing significant. No depression. He'll continue the trazodone. I did discuss placing him on something like Zoloft or Prozac given his mild anxiety though he declined this. He wanted evaluation for his attention deficit issues. No prior diagnosis of ADD. We will refer to psychology for further evaluation.  Constipation Patient reports bowel movements every 2-3 days. No abdominal pain. No blood in his stool. Discussed that it would be okay for him to start on fiber supplementation. He will continue to monitor.   Orders Placed This Encounter  Procedures  . Ambulatory referral to Psychology    Referral Priority:   Routine    Referral Type:   Psychiatric    Referral Reason:   Specialty Services Required    Requested Specialty:   Psychology    Number of Visits Requested:   [place], MD [place]

## 2017-01-18 NOTE — Assessment & Plan Note (Signed)
Well-controlled on recheck. Continue current medications. 

## 2017-01-18 NOTE — Patient Instructions (Signed)
Nice to see you. We'll get you to see the psychologist for evaluation of your attention issues. Please continue to monitor your diabetes and your blood pressure. You're doing a great job with exercise.

## 2017-01-18 NOTE — Assessment & Plan Note (Signed)
Continues to have sleep difficulty. Some mild anxiety though nothing significant. No depression. He'll continue the trazodone. I did discuss placing him on something like Zoloft or Prozac given his mild anxiety though he declined this. He wanted evaluation for his attention deficit issues. No prior diagnosis of ADD. We will refer to psychology for further evaluation.

## 2017-01-18 NOTE — Progress Notes (Signed)
Pre visit review using our clinic review tool, if applicable. No additional management support is needed unless otherwise documented below in the visit note. 

## 2017-01-18 NOTE — Assessment & Plan Note (Signed)
Patient reports bowel movements every 2-3 days. No abdominal pain. No blood in his stool. Discussed that it would be okay for him to start on fiber supplementation. He will continue to monitor.

## 2017-01-24 ENCOUNTER — Other Ambulatory Visit: Payer: Medicaid Other

## 2017-01-24 ENCOUNTER — Encounter: Payer: Self-pay | Admitting: Dietician

## 2017-01-24 ENCOUNTER — Other Ambulatory Visit: Payer: Self-pay

## 2017-01-24 DIAGNOSIS — E291 Testicular hypofunction: Secondary | ICD-10-CM

## 2017-01-24 DIAGNOSIS — N401 Enlarged prostate with lower urinary tract symptoms: Secondary | ICD-10-CM

## 2017-01-24 NOTE — Progress Notes (Signed)
Called pt-pt reports recent A1C was 5.1%; reports recent BG's range mostly 80's-140's with occasional pp result 180's (diet related)

## 2017-01-25 ENCOUNTER — Other Ambulatory Visit: Payer: Medicaid Other

## 2017-01-25 LAB — HEPATIC FUNCTION PANEL
ALT: 20 IU/L (ref 0–44)
AST: 24 IU/L (ref 0–40)
Albumin: 4.5 g/dL (ref 3.5–5.5)
Alkaline Phosphatase: 93 IU/L (ref 39–117)
Bilirubin Total: 0.5 mg/dL (ref 0.0–1.2)
Bilirubin, Direct: 0.13 mg/dL (ref 0.00–0.40)
Total Protein: 8 g/dL (ref 6.0–8.5)

## 2017-01-25 LAB — TESTOSTERONE: Testosterone: 470 ng/dL (ref 264–916)

## 2017-01-25 LAB — PSA: Prostate Specific Ag, Serum: 0.3 ng/mL (ref 0.0–4.0)

## 2017-01-25 LAB — HEMATOCRIT: Hematocrit: 43.2 % (ref 37.5–51.0)

## 2017-01-26 ENCOUNTER — Other Ambulatory Visit: Payer: Medicaid Other

## 2017-01-30 ENCOUNTER — Encounter: Payer: Self-pay | Admitting: Family Medicine

## 2017-01-30 ENCOUNTER — Ambulatory Visit (INDEPENDENT_AMBULATORY_CARE_PROVIDER_SITE_OTHER): Payer: Medicaid Other | Admitting: Family Medicine

## 2017-01-30 DIAGNOSIS — H109 Unspecified conjunctivitis: Secondary | ICD-10-CM | POA: Insufficient documentation

## 2017-01-30 DIAGNOSIS — I10 Essential (primary) hypertension: Secondary | ICD-10-CM

## 2017-01-30 DIAGNOSIS — H1032 Unspecified acute conjunctivitis, left eye: Secondary | ICD-10-CM | POA: Diagnosis not present

## 2017-01-30 NOTE — Progress Notes (Signed)
Pre visit review using our clinic review tool, if applicable. No additional management support is needed unless otherwise documented below in the visit note. 

## 2017-01-30 NOTE — Patient Instructions (Signed)
Nice to see you. Please monitor your eye for recurrent symptoms. If they recur you need to see the eye doctor quickly. We will get you set up a follow-up with the ophthalmologist. If you develop eye pain, sensitivity to light, redness, change in vision, or any new or changing symptoms please seek medical attention immediately.

## 2017-01-30 NOTE — Progress Notes (Signed)
  [name], MD Phone: [phone]  Michael Montgomery is a 56 y.o. male who presents today for same-day visit.  Patient notes 5 days ago he felt as though he may gotten something in his eye and then developed an itchiness and drainage. The next day his eye was crusted shut. He notes his vision may been a little blurry out of that eye with this. He notes there was some sensitivity to light with it. There is no pain in the eye. He notes this resolved over the period of a day to 2 days. He has no symptoms currently. He last saw the ophthalmologist a year ago.  Hypertension: Blood pressures been well controlled previously. He just came from the gym. He notes no chest pain or shortness of breath. He is taking his blood pressure medications.  ROS see history of present illness  Objective  Physical Exam Vitals:   01/30/17 1320  BP: (!) 150/90  Pulse: 93  Temp: 98.7 F (37.1 C)    BP Readings from Last 3 Encounters:  01/30/17 (!) 150/90  01/18/17 110/78  01/16/17 126/80   Wt Readings from Last 3 Encounters:  01/30/17 185 lb 9.6 oz (84.2 kg)  01/18/17 186 lb 9.6 oz (84.6 kg)  01/16/17 184 lb 11.2 oz (83.8 kg)    Physical Exam  Constitutional: No distress.  HENT:  Head: Normocephalic and atraumatic.  Mouth/Throat: Oropharynx is clear and moist. No oropharyngeal exudate.  Eyes: Conjunctivae and EOM are normal. Pupils are equal, round, and reactive to light. Right eye exhibits no discharge. Left eye exhibits no discharge.  Bilaterally no gross changes to the cornea, no blood or white blood cell collection noted on gross visualization of cornea  Cardiovascular: Normal rate, regular rhythm and normal heart sounds.   Pulmonary/Chest: Effort normal and breath sounds normal.  Neurological: He is alert.  Skin: He is not diaphoretic.     Assessment/Plan: Please see individual problem list.  Conjunctivitis Suspect allergic conjunctivitis or viral conjunctivitis was causing  symptoms. His symptoms have completely resolved at this time. He has no red flag findings on exam. Discussed that we would get him in for follow up with his ophthalmologist for routine diabetic eye exams. Advised if he has recurrence of his symptoms he should be evaluated.  Essential hypertension Elevated today though has been well controlled. He will monitor at home and return in 1-2 weeks for recheck with nursing. He'll continue his current medications.  [name], MD [place]

## 2017-01-30 NOTE — Assessment & Plan Note (Signed)
Suspect allergic conjunctivitis or viral conjunctivitis was causing symptoms. His symptoms have completely resolved at this time. He has no red flag findings on exam. Discussed that we would get him in for follow up with his ophthalmologist for routine diabetic eye exams. Advised if he has recurrence of his symptoms he should be evaluated.

## 2017-01-30 NOTE — Assessment & Plan Note (Signed)
Elevated today though has been well controlled. He will monitor at home and return in 1-2 weeks for recheck with nursing. He'll continue his current medications.

## 2017-02-01 NOTE — Progress Notes (Signed)
9:11 AM   Michael Montgomery 1961-10-09 [id number]  Referring provider: Leone Haven, MD [address] [place], [place] [address]  Chief Complaint  Patient presents with  . Benign Prostatic Hypertrophy    6 month follow up  . Hypogonadism    HPI: 56 yo AAM who with hypogonadism, ED and BPH with LU TS who presents for a 6 month follow up.    Hypogonadism Patient is experiencing a decrease in libido, a lack of energy, a decrease in strength, erections being less strong and a recent deterioration in his ability to play sports.   This is indicated by his responses to the ADAM questionnaire.  He is no longer having spontaneous erections at night.  He does not have sleep apnea.   His current testosterone level was 470 ng/dL on 01/24/2017.  He is currently managing his hypogonadism with Clomid 50 mg, 1/2 daily.   His HCT and LFT's are wnl.       Androgen Deficiency in the Aging Male    [place] Name 02/02/17 0800         Androgen Deficiency in the Aging Male   Do you have a decrease in libido (sex drive) Yes     Do you have lack of energy Yes     Do you have a decrease in strength and/or endurance Yes     Have you lost height No     Have you noticed a decreased "enjoyment of life" No     Are you sad and/or grumpy No     Are your erections less strong Yes     Have you noticed a recent deterioration in your ability to play sports Yes     Are you falling asleep after dinner No     Has there been a recent deterioration in your work performance No        Erectile dysfunction His SHIM score is 9, which is mild to moderate erectile dysfunction.  His previous SHIM was 12.   He has been having difficulty with erections for 1 year.   His major complaint is achieving and maintaining an erection.  His libido is diminished.   His risk factors for ED are age, BPH, smoking, diabetes, HTN and HLD.  He denies any painful erections or curvatures with his erections.   He is no longer  having spontaneous erections.   He has tried Viagra and Cialis in the past without success.   He has had good success with the Trimix injections.     SHIM    Row Name 02/02/17 0849         SHIM: Over the last 6 months:   How do you rate your confidence that you could get and keep an erection? Low     When you had erections with sexual stimulation, how often were your erections hard enough for penetration (entering your partner)? Almost Never or Never     During sexual intercourse, how often were you able to maintain your erection after you had penetrated (entered) your partner? Almost Never or Never     During sexual intercourse, how difficult was it to maintain your erection to completion of intercourse? Very Difficult     When you attempted sexual intercourse, how often was it satisfactory for you? Sometimes (about half the time)       SHIM Total Score   SHIM 9        BPH WITH LUTS His  IPSS score today is 2, which is mild lower urinary tract symptomatology.  He is mostly satified with his quality life due to his urinary symptoms.   His previous I PSS score was 1/0.  His major complaint today nocturia x 1.  He has had these symptoms for several years.  He denies any dysuria, hematuria or suprapubic pain.  He also denies any recent fevers, chills, nausea or vomiting.  He does not have a family history of PCa.     IPSS    Row Name 02/02/17 0800         International Prostate Symptom Score   How often have you had the sensation of not emptying your bladder? Not at All     How often have you had to urinate less than every two hours? Not at All     How often have you found you stopped and started again several times when you urinated? Not at All     How often have you found it difficult to postpone urination? Not at All     How often have you had a weak urinary stream? Not at All     How often have you had to strain to start urination? Not at All     How many times did you typically  get up at night to urinate? 2 Times     Total IPSS Score 2       Quality of Life due to urinary symptoms   If you were to spend the rest of your life with your urinary condition just the way it is now how would you feel about that? Mostly Satisfied        PMH: Past Medical History:  Diagnosis Date  . Cataract march, april   bilateral removed   . Chickenpox   . Diabetes mellitus without complication (HCC)    metformin   . 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 Gastric ulcer   . Heart murmur    When he was younger, no recent issues  . High blood pressure    controlled with meds  . Hypercholesteremia    controlled with medication  . Knee injury    left  . Motion sickness    boats    Surgical History: Past Surgical History:  Procedure Laterality Date  . CATARACT EXTRACTION W/PHACO Right 02/01/2016   Procedure: CATARACT EXTRACTION PHACO AND INTRAOCULAR LENS PLACEMENT ([place]) right;  Surgeon: Ronnell Freshwater, MD;  Location: [facility];  Service: Ophthalmology;  Laterality: Right;  DIABETIC - oral meds  . CATARACT EXTRACTION W/PHACO Left 03/07/2016   Procedure: CATARACT EXTRACTION PHACO AND INTRAOCULAR LENS PLACEMENT ([facility]);  Surgeon: Ronnell Freshwater, MD;  Location: [facility];  Service: Ophthalmology;  Laterality: Left;  DIABETIC - oral meds   . circumscision    . EYE SURGERY      Home Medications:  Allergies as of 02/02/2017   No Known Allergies     Medication List       Accurate as of 02/02/17  9:11 AM. Always use your most recent med list.          ACCU-CHEK AVIVA PLUS test strip Generic drug:  glucose blood USE AS DIRECTED   ACCU-CHEK SOFTCLIX LANCETS lancets USE AS DIRECTED   AMITIZA 24 MCG capsule Generic drug:  lubiprostone Take 24 mcg by mouth 2 (two) times daily.   amLODipine 10 MG tablet Commonly known as:  NORVASC Take 1  tablet (10 mg total) by mouth daily.   atorvastatin 40 MG tablet Commonly known as:   LIPITOR Take 1 tablet (40 mg total) by mouth daily.   clomiPHENE 50 MG tablet Commonly known as:  CLOMID Take 1/2 tablet daily   fluticasone 50 MCG/ACT nasal spray Commonly known as:  FLONASE Place 2 sprays into both nostrils daily.   gabapentin 300 MG capsule Commonly known as:  NEURONTIN Take 1 capsule by mouth 2 (two) times daily. Take 1 cap in AM and 2 caps at bedtime   lisinopril 5 MG tablet Commonly known as:  PRINIVIL,ZESTRIL Take 0.5 tablets (2.5 mg total) by mouth daily.   loratadine 10 MG tablet Commonly known as:  CLARITIN Take 1 tablet (10 mg total) by mouth daily.   metFORMIN 500 MG tablet Commonly known as:  GLUCOPHAGE TAKE 2 TABLETS IN MORNING AND 1 TABLET AT NIGHT   multivitamin capsule Take 1 capsule by mouth daily.   nystatin-triamcinolone ointment Commonly known as:  MYCOLOG Apply 1 application topically 2 (two) times daily.   PROAIR HFA 108 (90 Base) MCG/ACT inhaler Generic drug:  albuterol 2 INH INHALATION EVERY FOUR TO SIX HOURS AS NEEDED   traZODone 50 MG tablet Commonly known as:  DESYREL Take 1 tablet (50 mg total) by mouth at bedtime as needed.       Allergies: No Known Allergies  Family History: Family History  Problem Relation Age of Onset  . Alcoholism Father   . Hyperlipidemia Father   . Hypertension Father   . Throat cancer Father     smoked but had stopped 20 yeras before dx  . Diabetes Mother   . Kidney disease Neg Hx   . Prostate cancer Neg Hx   . Colon cancer Neg Hx   . Colon polyps Neg Hx   . Esophageal cancer Neg Hx   . Rectal cancer Neg Hx   . Stomach cancer Neg Hx   . Kidney cancer Neg Hx     Social History:  reports that he has quit smoking. His smoking use included Cigars. He has quit using smokeless tobacco. He reports that he drinks alcohol. He reports that he does not use drugs.  ROS: UROLOGY Frequent Urination?: No Hard to postpone urination?: No Burning/pain with urination?: No Get up at night to  urinate?: No Leakage of urine?: No Urine stream starts and stops?: No Trouble starting stream?: No Do you have to strain to urinate?: No Blood in urine?: No Urinary tract infection?: No Sexually transmitted disease?: No Injury to kidneys or bladder?: No Painful intercourse?: No Weak stream?: No Erection problems?: No Penile pain?: No  Gastrointestinal Nausea?: No Vomiting?: No Indigestion/heartburn?: No Diarrhea?: No Constipation?: No  Constitutional Fever: No Night sweats?: No Weight loss?: No Fatigue?: No  Skin Skin rash/lesions?: No Itching?: No  Eyes Blurred vision?: No Double vision?: No  Ears/Nose/Throat Sore throat?: No Sinus problems?: No  Hematologic/Lymphatic Swollen glands?: No Easy bruising?: No  Cardiovascular Leg swelling?: No Chest pain?: No  Respiratory Cough?: No Shortness of breath?: No  Endocrine Excessive thirst?: No  Musculoskeletal Back pain?: No Joint pain?: No  Neurological Headaches?: No Dizziness?: No  Psychologic Depression?: No Anxiety?: No  Physical Exam: BP (!) 161/100   Pulse 93   Ht 5\' 10"  (1.778 m)   Wt 188 lb 4.8 oz (85.4 kg)   BMI 27.02 kg/m   Constitutional: Well nourished. Alert and oriented, No acute distress. HEENT: [place] AT, moist mucus membranes. Trachea midline, no masses.  Cardiovascular: No clubbing, cyanosis, or edema. Respiratory: Normal respiratory effort, no increased work of breathing. GI: Abdomen is soft, non tender, non distended, no abdominal masses. Liver and spleen not palpable.  No hernias appreciated.  Stool sample for occult testing is not indicated.   GU: No CVA tenderness.  No bladder fullness or masses.  Patient with uncircumcised phallus. Foreskin easily retracted with balanitis.   Urethral meatus is patent.  No penile discharge. No penile lesions or rashes. Scrotum without lesions, cysts, rashes and/or edema.  Testicles are located scrotally bilaterally. No masses are appreciated  in the testicles. Left and right epididymis are normal. Rectal: Deferred.   Skin: No rashes, bruises or suspicious lesions. Lymph: No cervical or inguinal adenopathy. Neurologic: Grossly intact, no focal deficits, moving all 4 extremities. Psychiatric: Normal mood and affect.  Laboratory Data: PSA History  0.3 ng/mL on 01/24/2017  Lab Results  Component Value Date   WBC 5.4 04/11/2016   HGB 14.5 04/11/2016   HCT 43.2 01/24/2017   MCV 88.0 04/11/2016   PLT 211 04/11/2016    Lab Results  Component Value Date   CREATININE 0.86 12/08/2016     Lab Results  Component Value Date   TESTOSTERONE 470 01/24/2017    Lab Results  Component Value Date   HGBA1C 5.1 12/08/2016    Lab Results  Component Value Date   TSH 1.68 01/19/2016       Component Value Date/Time   CHOL 197 01/19/2016 1131   HDL 45.00 01/19/2016 1131   CHOLHDL 4 01/19/2016 1131   VLDL 22.0 01/19/2016 1131   LDLCALC 130 (H) 01/19/2016 1131    Lab Results  Component Value Date   AST 24 01/24/2017   Lab Results  Component Value Date   ALT 20 01/24/2017     Assessment & Plan:    1. Hypogonadism:     -most recent testosterone level is 470 ng/dL on 01/24/2017  -continue Clomid 50 mg, 1/2 tablet daily; refills  -RTC in 6 months for HCT, testosterone, PSA, LFT's, ADAM and exam  2. BPH with LUTS  - IPSS score is 2/2, it is worsening  - Continue conservative management, avoiding bladder irritants and timed voiding's  - Cannot tolerate medication or medication failure, schedule cystoscopy  - RTC in 6 months for IPSS, PSA and exam, as testosterone therapy can cause prostate enlargement and worsen LUTS  3. Erectile dysfunction:     -SHIM score is 9  -Continue Trimix   -RTC in 6 months for SHIM score and exam, as testosterone therapy can affect erections     Return for PSA, LFT's, HCT, testosterone (before 10 AM), ADAM, IPSS, SHIM and exam.  These notes generated with voice recognition software.  I apologize for typographical errors.  Zara Council, [facility] [address], [place], [place] [address] [phone]

## 2017-02-02 ENCOUNTER — Ambulatory Visit (INDEPENDENT_AMBULATORY_CARE_PROVIDER_SITE_OTHER): Payer: Medicaid Other | Admitting: Urology

## 2017-02-02 ENCOUNTER — Telehealth: Payer: Self-pay | Admitting: Family Medicine

## 2017-02-02 ENCOUNTER — Encounter: Payer: Self-pay | Admitting: Urology

## 2017-02-02 VITALS — BP 161/100 | HR 93 | Ht 70.0 in | Wt 188.3 lb

## 2017-02-02 DIAGNOSIS — N529 Male erectile dysfunction, unspecified: Secondary | ICD-10-CM

## 2017-02-02 DIAGNOSIS — N401 Enlarged prostate with lower urinary tract symptoms: Secondary | ICD-10-CM | POA: Diagnosis not present

## 2017-02-02 DIAGNOSIS — E291 Testicular hypofunction: Secondary | ICD-10-CM

## 2017-02-02 DIAGNOSIS — N138 Other obstructive and reflux uropathy: Secondary | ICD-10-CM | POA: Diagnosis not present

## 2017-02-02 MED ORDER — CLOMIPHENE CITRATE 50 MG PO TABS: ORAL_TABLET | ORAL | 3 refills | Status: DC

## 2017-02-02 NOTE — Telephone Encounter (Signed)
Patient denies having any symptoms, he will increase the medication and is scheduled for follow up on Wednesday, he will have labs done at that time

## 2017-02-02 NOTE — Telephone Encounter (Signed)
Please make sure he is not having any chest pain or shortness of breath or other symptoms with this. If he is not he can increase his lisinopril to 5 mg daily. He should have lab work in 1 week with this increase. He should also follow-up in the office around 1 week for recheck of his blood pressure with me. Thanks.

## 2017-02-02 NOTE — Telephone Encounter (Signed)
Pt called wanting to know what Dr Caryl Bis wants him to do. Pt says he just saw the urologist and took his BP 161/100 he says it's been that way every since Monday. Please advise?  Call pt @ [phone]. Thank you!

## 2017-02-02 NOTE — Telephone Encounter (Signed)
Please advise 

## 2017-02-06 ENCOUNTER — Ambulatory Visit: Payer: Medicaid Other | Admitting: Family Medicine

## 2017-02-08 ENCOUNTER — Ambulatory Visit: Payer: Medicaid Other | Admitting: Family Medicine

## 2017-02-09 ENCOUNTER — Other Ambulatory Visit: Payer: Self-pay | Admitting: Family Medicine

## 2017-02-14 ENCOUNTER — Ambulatory Visit (INDEPENDENT_AMBULATORY_CARE_PROVIDER_SITE_OTHER): Payer: Medicaid Other | Admitting: Family Medicine

## 2017-02-14 ENCOUNTER — Encounter: Payer: Self-pay | Admitting: Family Medicine

## 2017-02-14 VITALS — BP 130/88 | HR 78 | Temp 98.5°F | Wt 185.8 lb

## 2017-02-14 DIAGNOSIS — I10 Essential (primary) hypertension: Secondary | ICD-10-CM | POA: Diagnosis not present

## 2017-02-14 DIAGNOSIS — R41840 Attention and concentration deficit: Secondary | ICD-10-CM | POA: Diagnosis not present

## 2017-02-14 LAB — BASIC METABOLIC PANEL
BUN: 12 mg/dL (ref 6–23)
CO2: 31 mEq/L (ref 19–32)
Calcium: 9.4 mg/dL (ref 8.4–10.5)
Chloride: 101 mEq/L (ref 96–112)
Creatinine, Ser: 0.8 mg/dL (ref 0.40–1.50)
GFR: 129.55 mL/min (ref >60.00)
Glucose, Bld: 150 mg/dL — ABNORMAL HIGH (ref 70–99)
Potassium: 4.5 mEq/L (ref 3.5–5.1)
Sodium: 137 mEq/L (ref 135–145)

## 2017-02-14 MED ORDER — LISINOPRIL 5 MG PO TABS: 10.0000 mg | ORAL_TABLET | Freq: Every day | ORAL | 3 refills | Status: DC

## 2017-02-14 NOTE — Progress Notes (Signed)
Pre visit review using our clinic review tool, if applicable. No additional management support is needed unless otherwise documented below in the visit note. 

## 2017-02-14 NOTE — Assessment & Plan Note (Signed)
Improved though still above goal. We will increase lisinopril to 10 mg daily. We'll check a BMP today and in 1 week. He'll follow-up in one month for nurse blood pressure check. He'll see me in 3 months.

## 2017-02-14 NOTE — Patient Instructions (Signed)
Nice to see you. We'll increase your lisinopril to 10 mg daily. I sent in a new prescription for you. We'll check blood work today and in 1 week. We will check on a psychologist to evaluate for ADD.

## 2017-02-14 NOTE — Progress Notes (Signed)
  [name], MD Phone: [phone]  Michael Montgomery is a 56 y.o. male who presents today for follow-up.  Hypertension: Typically in the 130s over 80s at home. Taking amlodipine and lisinopril. Now taking 5 mg of lisinopril. No chest pain or shortness of breath or edema.  Continues to have issues with focusing. Can stay on one task very well for a long time. Focus bounces around. Notes this has only been an issue since he's been an adult. Has been unable to get in to see a psychologist yet given that he has Medicaid.   ROS see history of present illness  Objective  Physical Exam Vitals:   02/14/17 1459  BP: 130/88  Pulse: 78  Temp: 98.5 F (36.9 C)    BP Readings from Last 3 Encounters:  02/14/17 130/88  02/02/17 (!) 161/100  01/30/17 (!) 150/90   Wt Readings from Last 3 Encounters:  02/14/17 185 lb 12.8 oz (84.3 kg)  02/02/17 188 lb 4.8 oz (85.4 kg)  01/30/17 185 lb 9.6 oz (84.2 kg)    Physical Exam  Constitutional: No distress.  HENT:  Head: Normocephalic and atraumatic.  Cardiovascular: Normal rate, regular rhythm and normal heart sounds.   Pulmonary/Chest: Effort normal and breath sounds normal.  Skin: He is not diaphoretic.     Assessment/Plan: Please see individual problem list.  Essential hypertension Improved though still above goal. We will increase lisinopril to 10 mg daily. We'll check a BMP today and in 1 week. He'll follow-up in one month for nurse blood pressure check. He'll see me in 3 months.  Attention deficit Referral already placed to psych. Will work on getting him in with medicaid.    Orders Placed This Encounter  Procedures  . Basic Metabolic Panel (BMET)    Meds ordered this encounter  Medications  . lisinopril (PRINIVIL,ZESTRIL) 5 MG tablet    Sig: Take 2 tablets (10 mg total) by mouth daily.    Dispense:  180 tablet    Refill:  [place], MD [place]

## 2017-02-14 NOTE — Assessment & Plan Note (Signed)
Referral already placed to psych. Will work on getting him in with medicaid.

## 2017-02-21 ENCOUNTER — Other Ambulatory Visit (INDEPENDENT_AMBULATORY_CARE_PROVIDER_SITE_OTHER): Payer: Medicaid Other

## 2017-02-21 DIAGNOSIS — I10 Essential (primary) hypertension: Secondary | ICD-10-CM | POA: Diagnosis not present

## 2017-02-21 LAB — BASIC METABOLIC PANEL
BUN: 15 mg/dL (ref 6–23)
CO2: 29 mEq/L (ref 19–32)
Calcium: 9.8 mg/dL (ref 8.4–10.5)
Chloride: 102 mEq/L (ref 96–112)
Creatinine, Ser: 0.86 mg/dL (ref 0.40–1.50)
GFR: 119.17 mL/min (ref >60.00)
Glucose, Bld: 136 mg/dL — ABNORMAL HIGH (ref 70–99)
Potassium: 4.2 mEq/L (ref 3.5–5.1)
Sodium: 136 mEq/L (ref 135–145)

## 2017-03-11 ENCOUNTER — Other Ambulatory Visit: Payer: Self-pay | Admitting: Family Medicine

## 2017-03-16 ENCOUNTER — Ambulatory Visit (INDEPENDENT_AMBULATORY_CARE_PROVIDER_SITE_OTHER): Payer: Medicaid Other

## 2017-03-16 VITALS — BP 128/78 | HR 83 | Resp 18

## 2017-03-16 DIAGNOSIS — I10 Essential (primary) hypertension: Secondary | ICD-10-CM

## 2017-03-16 NOTE — Progress Notes (Signed)
Blood pressure is well-controlled. He should continue his current regimen.  [name], M.D.

## 2017-03-16 NOTE — Progress Notes (Signed)
Patient came in for BP check.  Has been taking amlopodine and increased dose of lisinopril 10mg  since last visit.  Per patient not checking BP at home per say but when he gets is IV treatments it is reading in the 130's over 70's.  See vitals for details.  Please advise , thanks

## 2017-04-14 ENCOUNTER — Ambulatory Visit (INDEPENDENT_AMBULATORY_CARE_PROVIDER_SITE_OTHER): Payer: Medicaid Other | Admitting: Podiatry

## 2017-04-14 ENCOUNTER — Encounter: Payer: Self-pay | Admitting: Podiatry

## 2017-04-14 DIAGNOSIS — B351 Tinea unguium: Secondary | ICD-10-CM | POA: Diagnosis not present

## 2017-04-14 DIAGNOSIS — M79676 Pain in unspecified toe(s): Secondary | ICD-10-CM

## 2017-04-14 DIAGNOSIS — M79609 Pain in unspecified limb: Principal | ICD-10-CM

## 2017-04-14 DIAGNOSIS — E119 Type 2 diabetes mellitus without complications: Secondary | ICD-10-CM

## 2017-04-14 NOTE — Progress Notes (Signed)
Complaint:  Visit Type: Patient returns to my office for continued preventative foot care services. Complaint: Patient states" my nails have grown long and thick and become painful to walk and wear shoes" Patient has been diagnosed with DM with no foot complications. The patient presents for preventative foot care services. No changes to ROS  Podiatric Exam: Vascular: dorsalis pedis and posterior tibial pulses are palpable bilateral. Capillary return is immediate. Temperature gradient is WNL. Skin turgor WNL  Sensorium: Normal Semmes Weinstein monofilament test. Normal tactile sensation bilaterally. Nail Exam: Pt has thick disfigured discolored nails with subungual debris noted bilateral entire nail hallux through fifth toenails Ulcer Exam: There is no evidence of ulcer or pre-ulcerative changes or infection. Orthopedic Exam: Muscle tone and strength are WNL. No limitations in general ROM. No crepitus or effusions noted. Hallux limitus  Right.  Hammer toes 2-4  B/L Skin: No Porokeratosis. No infection or ulcers  Diagnosis:  Onychomycosis, , Pain in right toe, pain in left toes  Treatment & Plan Procedures and Treatment: Consent by patient was obtained for treatment procedures. The patient understood the discussion of treatment and procedures well. All questions were answered thoroughly reviewed. Debridement of mycotic and hypertrophic toenails, 1 through 5 bilateral and clearing of subungual debris. No ulceration, no infection noted.  Return Visit-Office Procedure: Patient instructed to return to the office for a follow up visit 3 months for continued evaluation and treatment.    Lylie Blacklock DPM 

## 2017-05-02 ENCOUNTER — Ambulatory Visit (INDEPENDENT_AMBULATORY_CARE_PROVIDER_SITE_OTHER): Payer: Medicaid Other | Admitting: Family Medicine

## 2017-05-02 ENCOUNTER — Encounter: Payer: Self-pay | Admitting: Family Medicine

## 2017-05-02 VITALS — BP 124/70 | HR 89 | Temp 98.6°F | Wt 187.4 lb

## 2017-05-02 DIAGNOSIS — R05 Cough: Secondary | ICD-10-CM | POA: Diagnosis not present

## 2017-05-02 DIAGNOSIS — I10 Essential (primary) hypertension: Secondary | ICD-10-CM

## 2017-05-02 DIAGNOSIS — R059 Cough, unspecified: Secondary | ICD-10-CM | POA: Insufficient documentation

## 2017-05-02 DIAGNOSIS — R21 Rash and other nonspecific skin eruption: Secondary | ICD-10-CM | POA: Insufficient documentation

## 2017-05-02 MED ORDER — LOSARTAN POTASSIUM 50 MG PO TABS: 50.0000 mg | ORAL_TABLET | Freq: Every day | ORAL | 3 refills | Status: DC

## 2017-05-02 MED ORDER — TRIAMCINOLONE ACETONIDE 0.1 % EX CREA: 1.0000 "application " | TOPICAL_CREAM | Freq: Two times a day (BID) | CUTANEOUS | 0 refills | Status: DC

## 2017-05-02 MED ORDER — LORATADINE 10 MG PO TABS: 10.0000 mg | ORAL_TABLET | Freq: Every day | ORAL | 11 refills | Status: DC

## 2017-05-02 NOTE — Assessment & Plan Note (Signed)
Could be related to a number of different things. He is on lisinopril which could contribute to cough. Potentially could be related to reflux though he does not have this very frequently. Also does complain of sneezing so could be related to allergies with some postnasal drip. We'll start on Claritin. We'll switch lisinopril to losartan. He will make some dietary changes. If continues after a week or so could consider addition of Prilosec.

## 2017-05-02 NOTE — Assessment & Plan Note (Signed)
At goal. We are changing to losartan and thus we'll check a BMP in 1 week.

## 2017-05-02 NOTE — Assessment & Plan Note (Addendum)
Potential eczema or allergic dermatitis. We'll treat with triamcinolone. If not improving a let us know.

## 2017-05-02 NOTE — Patient Instructions (Signed)
Nice to see you. Please discontinue the lisinopril as this may be causing your cough. We will change you to losartan. We'll start you on Claritin to see if this is beneficial as well. Please watch your diet and do not eat too quickly. Acidic foods such as tomatoes could be contributing to reflux. Please use the cream on your rash. If your symptoms are not improving please let us know.

## 2017-05-02 NOTE — Progress Notes (Signed)
  [name], MD Phone: [phone]  Michael Montgomery is a 56 y.o. male who presents today for same-day visit.  Patient notes cough over the last month. Notes it is mostly a dry cough though occasionally coughs something up. Just feels irritated in his throat and chest. No fevers. No shortness of breath. No real upper respiratory congestion. Does note some sneezing and occasional postnasal drip. Does note occasional reflux though this is somewhat rare. Typically only occurs if he overeats.  Does note a rash on his left forearm just distal to the elbow. Thinks it's related to heat as it occurs after placing his arm in his car window. It does itch.  PMH: Former smoker   ROS see history of present illness  Objective  Physical Exam Vitals:   05/02/17 1605  BP: 124/70  Pulse: 89  Temp: 98.6 F (37 C)    BP Readings from Last 3 Encounters:  05/02/17 124/70  03/16/17 128/78  02/14/17 130/88   Wt Readings from Last 3 Encounters:  05/02/17 187 lb 6.4 oz (85 kg)  02/14/17 185 lb 12.8 oz (84.3 kg)  02/02/17 188 lb 4.8 oz (85.4 kg)    Physical Exam  Constitutional: He is well-developed, well-nourished, and in no distress.  HENT:  Head: Normocephalic and atraumatic.  Mouth/Throat: Oropharynx is clear and moist. No oropharyngeal exudate.  Cardiovascular: Normal rate, regular rhythm and normal heart sounds.   Pulmonary/Chest: Effort normal and breath sounds normal.  Abdominal: Soft. Bowel sounds are normal. He exhibits no distension. There is no tenderness. There is no rebound and no guarding.  Neurological: He is alert. Gait normal.  Skin: Skin is warm and dry.   small area of erythematous papular rash over right proximal lateral forearm   Assessment/Plan: Please see individual problem list.  Cough Could be related to a number of different things. He is on lisinopril which could contribute to cough. Potentially could be related to reflux though he does not have this very  frequently. Also does complain of sneezing so could be related to allergies with some postnasal drip. We'll start on Claritin. We'll switch lisinopril to losartan. He will make some dietary changes. If continues after a week or so could consider addition of Prilosec.  Rash Potential eczema or allergic dermatitis. We'll treat with triamcinolone. If not improving a let us know.  Essential hypertension At goal. We are changing to losartan and thus we'll check a BMP in 1 week.   Orders Placed This Encounter  Procedures  . Basic Metabolic Panel (BMET)    Standing Status:   Future    Standing Expiration Date:   05/02/2018    Meds ordered this encounter  Medications  . Omega-3 Fatty Acids (FISH OIL PO)    Sig: Take by mouth.  . losartan (COZAAR) 50 MG tablet    Sig: Take 1 tablet (50 mg total) by mouth daily.    Dispense:  90 tablet    Refill:  3  . triamcinolone cream (KENALOG) 0.1 %    Sig: Apply 1 application topically 2 (two) times daily.    Dispense:  30 g    Refill:  0  . loratadine (CLARITIN) 10 MG tablet    Sig: Take 1 tablet (10 mg total) by mouth daily.    Dispense:  30 tablet    Refill:  [place], MD [place]

## 2017-05-09 ENCOUNTER — Other Ambulatory Visit (INDEPENDENT_AMBULATORY_CARE_PROVIDER_SITE_OTHER): Payer: Medicaid Other

## 2017-05-09 DIAGNOSIS — I10 Essential (primary) hypertension: Secondary | ICD-10-CM

## 2017-05-09 LAB — BASIC METABOLIC PANEL
BUN: 14 mg/dL (ref 6–23)
CO2: 30 mEq/L (ref 19–32)
Calcium: 9.9 mg/dL (ref 8.4–10.5)
Chloride: 103 mEq/L (ref 96–112)
Creatinine, Ser: 0.93 mg/dL (ref 0.40–1.50)
GFR: 108.79 mL/min (ref >60.00)
Glucose, Bld: 109 mg/dL — ABNORMAL HIGH (ref 70–99)
Potassium: 4.5 mEq/L (ref 3.5–5.1)
Sodium: 139 mEq/L (ref 135–145)

## 2017-05-16 ENCOUNTER — Other Ambulatory Visit: Payer: Self-pay | Admitting: Family Medicine

## 2017-05-16 ENCOUNTER — Ambulatory Visit (INDEPENDENT_AMBULATORY_CARE_PROVIDER_SITE_OTHER): Payer: Medicaid Other | Admitting: Family Medicine

## 2017-05-16 ENCOUNTER — Encounter: Payer: Self-pay | Admitting: Family Medicine

## 2017-05-16 VITALS — BP 132/70 | HR 80 | Temp 99.2°F | Wt 182.6 lb

## 2017-05-16 DIAGNOSIS — H1031 Unspecified acute conjunctivitis, right eye: Secondary | ICD-10-CM | POA: Diagnosis not present

## 2017-05-16 DIAGNOSIS — G6181 Chronic inflammatory demyelinating polyneuritis: Secondary | ICD-10-CM

## 2017-05-16 DIAGNOSIS — J309 Allergic rhinitis, unspecified: Secondary | ICD-10-CM

## 2017-05-16 DIAGNOSIS — I10 Essential (primary) hypertension: Secondary | ICD-10-CM

## 2017-05-16 DIAGNOSIS — E119 Type 2 diabetes mellitus without complications: Secondary | ICD-10-CM

## 2017-05-16 DIAGNOSIS — D72819 Decreased white blood cell count, unspecified: Secondary | ICD-10-CM

## 2017-05-16 LAB — CBC
HCT: 43.6 % (ref 39.0–52.0)
Hemoglobin: 14.7 g/dL (ref 13.0–17.0)
MCHC: 33.6 g/dL (ref 30.0–36.0)
MCV: 91.2 fl (ref 78.0–100.0)
Platelets: 172 10*3/uL (ref 150.0–400.0)
RBC: 4.78 Mil/uL (ref 4.22–5.81)
RDW: 13.3 % (ref 11.5–15.5)
WBC: 3.5 10*3/uL — ABNORMAL LOW (ref 4.0–10.5)

## 2017-05-16 LAB — HEMOGLOBIN A1C: Hgb A1c MFr Bld: 5.7 % (ref 4.6–6.5)

## 2017-05-16 LAB — BASIC METABOLIC PANEL
BUN: 11 mg/dL (ref 6–23)
CO2: 33 mEq/L — ABNORMAL HIGH (ref 19–32)
Calcium: 9.7 mg/dL (ref 8.4–10.5)
Chloride: 100 mEq/L (ref 96–112)
Creatinine, Ser: 0.88 mg/dL (ref 0.40–1.50)
GFR: 115.95 mL/min (ref >60.00)
Glucose, Bld: 122 mg/dL — ABNORMAL HIGH (ref 70–99)
Potassium: 4.6 mEq/L (ref 3.5–5.1)
Sodium: 137 mEq/L (ref 135–145)

## 2017-05-16 NOTE — Assessment & Plan Note (Signed)
Well-controlled at home. Continue current medications.

## 2017-05-16 NOTE — Assessment & Plan Note (Signed)
Suspect allergic or viral conjunctivitis. No red flag symptoms or findings on exam. Encouraged warm compresses and moisturizing eyedrops or ointments. Given return precautions.

## 2017-05-16 NOTE — Assessment & Plan Note (Addendum)
Stable. He will continue to follow with neurology.  

## 2017-05-16 NOTE — Assessment & Plan Note (Signed)
Cough improved with switch of blood pressure medication and use of Claritin. Also with alteration of diet. Suspect multifactorial. He'll continue to monitor and if not continuing to improve he'll let us know.

## 2017-05-16 NOTE — Progress Notes (Signed)
  Michael Rumps, MD Phone: [phone]  Michael Montgomery is a 56 y.o. male who presents today for follow-up.  Patient seen a week ago for cough. Lisinopril changed to losartan. Michael Montgomery has also been taking Claritin and altering his diet. Michael Montgomery notes the cough has improved quite a bit. Claritin has helped with his allergy symptoms. Not eating tomatoes has helped with the reflux sensation.  Hypertension: Typically in the 120s over 70s at home. Taking losartan and amlodipine. No chest pain, shortness of breath, or edema.  Diabetes: Checking his sugars and they're typically in the low 100s. Taking metformin. No polyuria or polydipsia. No hypoglycemia. Saw ophthalmology 1 month ago.  Michael Montgomery is followed by neurology for CIDP. Notes Michael Montgomery is getting stronger and stronger but does note some tiredness at times when Michael Montgomery has worked outside for longer than 4-5 hours. Undergoing IV treatment through them. Last saw them in May. Per their last note it appears they wanted a CBC checked.  Conjunctivitis: Notes over the last week or so his right eye has been itchy and puffy and crusty at times. Occurs intermittently. Possible exposure to pinkeye about a month ago. No vision changes. No photophobia. No severe foreign body sensation.  PMH: Former smoker   ROS see history of present illness  Objective  Physical Exam Vitals:   05/16/17 0846  BP: 132/70  Pulse: 80  Temp: 99.2 F (37.3 C)    BP Readings from Last 3 Encounters:  05/16/17 132/70  05/02/17 124/70  03/16/17 128/78   Wt Readings from Last 3 Encounters:  05/16/17 182 lb 9.6 oz (82.8 kg)  05/02/17 187 lb 6.4 oz (85 kg)  02/14/17 185 lb 12.8 oz (84.3 kg)    Physical Exam  Constitutional: No distress.  HENT:  Head: Normocephalic and atraumatic.  Eyes: Pupils are equal, round, and reactive to light.  Minimal puffiness of upper and lower eyelids on the right, minimal conjunctival erythema on the right, normal appearing cornea on gross examination,  no foreign body appreciated on gross examination, left eye appears normal with no conjunctival erythema  Cardiovascular: Normal rate, regular rhythm and normal heart sounds.   Pulmonary/Chest: Effort normal and breath sounds normal.  Musculoskeletal: Michael Montgomery exhibits no edema.  Neurological: Michael Montgomery is alert.  Skin: Michael Montgomery is not diaphoretic.     Assessment/Plan: Please see individual problem list.  Essential hypertension Well-controlled at home. Continue current medications.  Diabetes ([place]) Well-controlled. Check A1c today. If still well-controlled could consider discontinuing metformin.  CIDP (chronic inflammatory demyelinating polyneuropathy) (HCC) Stable. Michael Montgomery will continue to follow with neurology.  Conjunctivitis Suspect allergic or viral conjunctivitis. No red flag symptoms or findings on exam. Encouraged warm compresses and moisturizing eyedrops or ointments. Given return precautions.  Allergic rhinitis Cough improved with switch of blood pressure medication and use of Claritin. Also with alteration of diet. Suspect multifactorial. Michael Montgomery'll continue to monitor and if not continuing to improve Michael Montgomery'll let us know.   Orders Placed This Encounter  Procedures  . Basic Metabolic Panel (BMET)  . HgB A1c  . CBC   Michael Rumps, MD [place]

## 2017-05-16 NOTE — Patient Instructions (Signed)
Nice to see you. Please monitor cough and if it does not continue to improve please let us know. You can use warm compresses and over-the-counter moisturizing eyedrops or moisturizing eye ointment to help with your right eye. If you develop eye pain, vision changes, worsening symptoms, or symptoms not improving please be reevaluated.

## 2017-05-16 NOTE — Assessment & Plan Note (Signed)
Well-controlled. Check A1c today. If still well-controlled could consider discontinuing metformin.

## 2017-05-23 ENCOUNTER — Other Ambulatory Visit (INDEPENDENT_AMBULATORY_CARE_PROVIDER_SITE_OTHER): Payer: Medicaid Other

## 2017-05-23 DIAGNOSIS — D72819 Decreased white blood cell count, unspecified: Secondary | ICD-10-CM

## 2017-05-23 LAB — CBC WITH DIFFERENTIAL/PLATELET
Basophils Absolute: 0 10*3/uL (ref 0.0–0.1)
Basophils Relative: 0.7 % (ref 0.0–3.0)
Eosinophils Absolute: 0.1 10*3/uL (ref 0.0–0.7)
Eosinophils Relative: 3.5 % (ref 0.0–5.0)
HCT: 41.1 % (ref 39.0–52.0)
Hemoglobin: 13.9 g/dL (ref 13.0–17.0)
Lymphocytes Relative: 41.4 % (ref 12.0–46.0)
Lymphs Abs: 1.7 10*3/uL (ref 0.7–4.0)
MCHC: 33.9 g/dL (ref 30.0–36.0)
MCV: 90.3 fl (ref 78.0–100.0)
Monocytes Absolute: 0.4 10*3/uL (ref 0.1–1.0)
Monocytes Relative: 9.9 % (ref 3.0–12.0)
Neutro Abs: 1.8 10*3/uL (ref 1.4–7.7)
Neutrophils Relative %: 44.5 % (ref 43.0–77.0)
Platelets: 173 10*3/uL (ref 150.0–400.0)
RBC: 4.56 Mil/uL (ref 4.22–5.81)
RDW: 13.2 % (ref 11.5–15.5)
WBC: 4.1 10*3/uL (ref 4.0–10.5)

## 2017-06-07 ENCOUNTER — Ambulatory Visit: Payer: Medicaid Other | Admitting: Family Medicine

## 2017-06-20 ENCOUNTER — Ambulatory Visit: Payer: Medicaid Other | Admitting: Family Medicine

## 2017-06-26 ENCOUNTER — Other Ambulatory Visit: Payer: Self-pay | Admitting: Family Medicine

## 2017-07-14 ENCOUNTER — Ambulatory Visit: Payer: Medicaid Other | Admitting: Podiatry

## 2017-07-31 ENCOUNTER — Other Ambulatory Visit: Payer: Self-pay

## 2017-07-31 ENCOUNTER — Other Ambulatory Visit: Payer: Medicaid Other

## 2017-07-31 DIAGNOSIS — N401 Enlarged prostate with lower urinary tract symptoms: Secondary | ICD-10-CM

## 2017-07-31 DIAGNOSIS — E291 Testicular hypofunction: Secondary | ICD-10-CM

## 2017-08-01 ENCOUNTER — Other Ambulatory Visit: Payer: Self-pay

## 2017-08-02 ENCOUNTER — Encounter: Payer: Self-pay | Admitting: Podiatry

## 2017-08-02 ENCOUNTER — Ambulatory Visit (INDEPENDENT_AMBULATORY_CARE_PROVIDER_SITE_OTHER): Payer: Medicaid Other | Admitting: Podiatry

## 2017-08-02 DIAGNOSIS — M79676 Pain in unspecified toe(s): Secondary | ICD-10-CM | POA: Diagnosis not present

## 2017-08-02 DIAGNOSIS — B351 Tinea unguium: Secondary | ICD-10-CM | POA: Diagnosis not present

## 2017-08-02 DIAGNOSIS — M79609 Pain in unspecified limb: Principal | ICD-10-CM

## 2017-08-02 DIAGNOSIS — E119 Type 2 diabetes mellitus without complications: Secondary | ICD-10-CM

## 2017-08-02 NOTE — Progress Notes (Signed)
Complaint:  Visit Type: Patient returns to my office for continued preventative foot care services. Complaint: Patient states" my nails have grown long and thick and become painful to walk and wear shoes" Patient has been diagnosed with DM with no foot complications. The patient presents for preventative foot care services. No changes to ROS  Podiatric Exam: Vascular: dorsalis pedis and posterior tibial pulses are palpable bilateral. Capillary return is immediate. Temperature gradient is WNL. Skin turgor WNL  Sensorium: Normal Semmes Weinstein monofilament test. Normal tactile sensation bilaterally. Nail Exam: Pt has thick disfigured discolored nails with subungual debris noted bilateral entire nail hallux through fifth toenails Ulcer Exam: There is no evidence of ulcer or pre-ulcerative changes or infection. Orthopedic Exam: Muscle tone and strength are WNL. No limitations in general ROM. No crepitus or effusions noted. Hallux limitus  Right.  Hammer toes 2-4  B/L Skin: No Porokeratosis. No infection or ulcers  Diagnosis:  Onychomycosis, , Pain in right toe, pain in left toes  Treatment & Plan Procedures and Treatment: Consent by patient was obtained for treatment procedures. The patient understood the discussion of treatment and procedures well. All questions were answered thoroughly reviewed. Debridement of mycotic and hypertrophic toenails, 1 through 5 bilateral and clearing of subungual debris. No ulceration, no infection noted.  Return Visit-Office Procedure: Patient instructed to return to the office for a follow up visit 3 months for continued evaluation and treatment.    Gardiner Barefoot DPM

## 2017-08-03 ENCOUNTER — Ambulatory Visit: Payer: Medicaid Other | Admitting: Urology

## 2017-08-04 ENCOUNTER — Other Ambulatory Visit: Payer: Medicaid Other

## 2017-08-04 DIAGNOSIS — E291 Testicular hypofunction: Secondary | ICD-10-CM

## 2017-08-04 DIAGNOSIS — N401 Enlarged prostate with lower urinary tract symptoms: Secondary | ICD-10-CM

## 2017-08-06 LAB — HEPATIC FUNCTION PANEL
ALT: 29 IU/L (ref 0–44)
AST: 28 IU/L (ref 0–40)
Albumin: 4.5 g/dL (ref 3.5–5.5)
Alkaline Phosphatase: 115 IU/L (ref 39–117)
Bilirubin Total: 0.3 mg/dL (ref 0.0–1.2)
Bilirubin, Direct: 0.11 mg/dL (ref 0.00–0.40)
Total Protein: 6.8 g/dL (ref 6.0–8.5)

## 2017-08-06 LAB — PSA: Prostate Specific Ag, Serum: 0.4 ng/mL (ref 0.0–4.0)

## 2017-08-06 LAB — HEMOGLOBIN: Hemoglobin: 14 g/dL (ref 13.0–17.7)

## 2017-08-06 LAB — TESTOSTERONE: Testosterone: 352 ng/dL (ref 264–916)

## 2017-08-06 LAB — HEMATOCRIT: Hematocrit: 42.8 % (ref 37.5–51.0)

## 2017-08-07 NOTE — Progress Notes (Signed)
10:42 AM   Michael Montgomery [birth year] [id number]  Referring provider: Leone Haven, MD [address] [place], [place] [address]  Chief Complaint  Patient presents with  . Hypogonadism    6 month follow up  . Benign Prostatic Hypertrophy    HPI: 56 yo AAM who with testosterone deficiency, ED and BPH with LU TS who presents for a 6 month follow up.    Testosterone deficiency Patient is experiencing his erections being less strong.  This is indicated by his responses to the ADAM questionnaire.  He is no longer having spontaneous erections at night.  He does not have sleep apnea.   His current testosterone level was 352 ng/dL on 08/04/2017.  He is currently managing his testosterone deficiency with Clomid 50 mg, 1/2 daily.   His HCT and LFT's are wnl.  He has not been able to afford the medication for the last several weeks.       Androgen Deficiency in the Aging Male    [place] Name 08/09/17 1000         Androgen Deficiency in the Aging Male   Do you have a decrease in libido (sex drive) No     Do you have lack of energy No     Do you have a decrease in strength and/or endurance No     Have you lost height No     Have you noticed a decreased "enjoyment of life" No     Are you sad and/or grumpy No     Are your erections less strong Yes     Have you noticed a recent deterioration in your ability to play sports No     Are you falling asleep after dinner No     Has there been a recent deterioration in your work performance No        Erectile dysfunction His SHIM score is 6, which is severe erectile dysfunction.  His previous SHIM was 9.   He has been having difficulty with erections for 1 year.   His major complaint is achieving and maintaining an erection.  His libido is diminished.   His risk factors for ED are age, BPH, smoking, diabetes, testosterone deficiency, HTN and HLD.  He denies any painful erections or curvatures with his erections.   He is no longer  having spontaneous erections.   He has tried Viagra and Cialis in the past without success.   He has had good success with the Trimix injections.  He has not been able to afford the Trimix injections recently.       SHIM    Row Name 08/09/17 1019         SHIM: Over the last 6 months:   How do you rate your confidence that you could get and keep an erection? Very Low     When you had erections with sexual stimulation, how often were your erections hard enough for penetration (entering your partner)? Almost Never or Never     During sexual intercourse, how often were you able to maintain your erection after you had penetrated (entered) your partner? Almost Never or Never     During sexual intercourse, how difficult was it to maintain your erection to completion of intercourse? Extremely Difficult     When you attempted sexual intercourse, how often was it satisfactory for you? A Few Times (much less than half the time)       SHIM Total Score  SHIM 6        BPH WITH LUTS His IPSS score today is 2, which is mild lower urinary tract symptomatology.  He is delighted with his quality life due to his urinary symptoms.   His previous I PSS score was 2/2.  His major complaint today nocturia x 1.  He has had these symptoms for several years.  He denies any dysuria, hematuria or suprapubic pain.  He also denies any recent fevers, chills, nausea or vomiting.  He does not have a family history of PCa.     IPSS    Row Name 08/09/17 1000         International Prostate Symptom Score   How often have you had the sensation of not emptying your bladder? Not at All     How often have you had to urinate less than every two hours? Less than 1 in 5 times     How often have you found you stopped and started again several times when you urinated? Not at All     How often have you found it difficult to postpone urination? Not at All     How often have you had a weak urinary stream? Not at All     How often  have you had to strain to start urination? Not at All     How many times did you typically get up at night to urinate? 1 Time     Total IPSS Score 2       Quality of Life due to urinary symptoms   If you were to spend the rest of your life with your urinary condition just the way it is now how would you feel about that? Delighted        PMH: Past Medical History:  Diagnosis Date  . Cataract march, april   bilateral removed   . Chickenpox   . Diabetes mellitus without complication (HCC)    metformin   . 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 Gastric ulcer   . Heart murmur    When he was younger, no recent issues  . High blood pressure    controlled with meds  . Hypercholesteremia    controlled with medication  . Knee injury    left  . Motion sickness    boats    Surgical History: Past Surgical History:  Procedure Laterality Date  . CATARACT EXTRACTION W/PHACO Right 02/01/2016   Procedure: CATARACT EXTRACTION PHACO AND INTRAOCULAR LENS PLACEMENT ([place]) right;  Surgeon: Ronnell Freshwater, MD;  Location: [facility];  Service: Ophthalmology;  Laterality: Right;  DIABETIC - oral meds  . CATARACT EXTRACTION W/PHACO Left 03/07/2016   Procedure: CATARACT EXTRACTION PHACO AND INTRAOCULAR LENS PLACEMENT ([facility]);  Surgeon: Ronnell Freshwater, MD;  Location: [facility];  Service: Ophthalmology;  Laterality: Left;  DIABETIC - oral meds   . circumscision      Home Medications:  Allergies as of 08/09/2017   No Known Allergies     Medication List       Accurate as of 08/09/17 10:42 AM. Always use your most recent med list.          ACCU-CHEK AVIVA PLUS test strip Generic drug:  glucose blood USE AS DIRECTED   ACCU-CHEK SOFTCLIX LANCETS lancets USE AS DIRECTED   AMITIZA 24 MCG capsule Generic drug:  lubiprostone Take 24 mcg by mouth 2 (two) times daily.   amLODipine 10 MG tablet  Commonly known as:  NORVASC TAKE 1 TABLET (10 MG  TOTAL) BY MOUTH DAILY.   atorvastatin 40 MG tablet Commonly known as:  LIPITOR Take 1 tablet (40 mg total) by mouth daily.   clomiPHENE 50 MG tablet Commonly known as:  CLOMID Take 1/2 tablet daily   FISH OIL PO Take by mouth.   fluticasone 50 MCG/ACT nasal spray Commonly known as:  FLONASE Place 2 sprays into both nostrils daily.   gabapentin 300 MG capsule Commonly known as:  NEURONTIN Take 1 capsule by mouth 2 (two) times daily. Take 1 cap in AM and 2 caps at bedtime   lisinopril 5 MG tablet Commonly known as:  PRINIVIL,ZESTRIL Take by mouth.   loratadine 10 MG tablet Commonly known as:  CLARITIN Take 1 tablet (10 mg total) by mouth daily.   losartan 50 MG tablet Commonly known as:  COZAAR Take 1 tablet (50 mg total) by mouth daily.   metFORMIN 500 MG tablet Commonly known as:  GLUCOPHAGE TAKE 2 TABLETS IN MORNING AND 1 TABLET AT NIGHT   multivitamin capsule Take 1 capsule by mouth daily.   PROAIR HFA 108 (90 Base) MCG/ACT inhaler Generic drug:  albuterol 2 INH INHALATION EVERY FOUR TO SIX HOURS AS NEEDED   traZODone 50 MG tablet Commonly known as:  DESYREL Take 1 tablet (50 mg total) by mouth at bedtime as needed.   triamcinolone cream 0.1 % Commonly known as:  KENALOG Apply 1 application topically 2 (two) times daily.       Allergies: No Known Allergies  Family History: Family History  Problem Relation Age of Onset  . Alcoholism Father   . Hyperlipidemia Father   . Hypertension Father   . Throat cancer Father        smoked but had stopped 20 yeras before dx  . Diabetes Mother   . Kidney disease Neg Hx   . Prostate cancer Neg Hx   . Colon cancer Neg Hx   . Colon polyps Neg Hx   . Esophageal cancer Neg Hx   . Rectal cancer Neg Hx   . Stomach cancer Neg Hx   . Kidney cancer Neg Hx   . Bladder Cancer Neg Hx     Social History:  reports that he has quit smoking. His smoking use included Cigars. He has quit using smokeless tobacco. He  reports that he drinks alcohol. He reports that he does not use drugs.  ROS: UROLOGY Frequent Urination?: No Hard to postpone urination?: No Burning/pain with urination?: No Get up at night to urinate?: No Leakage of urine?: No Urine stream starts and stops?: No Trouble starting stream?: No Do you have to strain to urinate?: No Blood in urine?: No Urinary tract infection?: No Sexually transmitted disease?: No Injury to kidneys or bladder?: No Painful intercourse?: No Weak stream?: No Erection problems?: No Penile pain?: No  Gastrointestinal Nausea?: No Vomiting?: No Indigestion/heartburn?: No Diarrhea?: No Constipation?: No  Constitutional Fever: No Night sweats?: No Weight loss?: No Fatigue?: No  Skin Skin rash/lesions?: No Itching?: No  Eyes Blurred vision?: No Double vision?: No  Ears/Nose/Throat Sore throat?: No Sinus problems?: No  Hematologic/Lymphatic Swollen glands?: No Easy bruising?: No  Cardiovascular Leg swelling?: No Chest pain?: No  Respiratory Cough?: No Shortness of breath?: No  Endocrine Excessive thirst?: No  Musculoskeletal Back pain?: No Joint pain?: No  Neurological Headaches?: No Dizziness?: No  Psychologic Depression?: No Anxiety?: No  Physical Exam: BP (!) 150/83   Pulse 88  Ht 5' 10.5" (1.791 m)   Wt 182 lb 9.6 oz (82.8 kg)   BMI 25.83 kg/m   Constitutional: Well nourished. Alert and oriented, No acute distress. HEENT: [place] AT, moist mucus membranes. Trachea midline, no masses. Cardiovascular: No clubbing, cyanosis, or edema. Respiratory: Normal respiratory effort, no increased work of breathing. GI: Abdomen is soft, non tender, non distended, no abdominal masses. Liver and spleen not palpable.  No hernias appreciated.  Stool sample for occult testing is not indicated.   GU: No CVA tenderness.  No bladder fullness or masses.  Patient with uncircumcised phallus. Foreskin easily retracted.  Urethral meatus is  patent.  No penile discharge. No penile lesions or rashes. Vitiligo on glans.  Scrotum without lesions, cysts, rashes and/or edema.  Testicles are located scrotally bilaterally. No masses are appreciated in the testicles. Left and right epididymis are normal. Rectal: Deferred.   Skin: No rashes, bruises or suspicious lesions. Lymph: No cervical or inguinal adenopathy. Neurologic: Grossly intact, no focal deficits, moving all 4 extremities. Psychiatric: Normal mood and affect.  Laboratory Data: PSA History  0.3 ng/mL on 01/24/2017  0.4 ng/mL on 08/04/2017  Lab Results  Component Value Date   WBC 4.1 05/23/2017   HGB 14.0 08/04/2017   HCT 42.8 08/04/2017   MCV 90.3 05/23/2017   PLT 173.0 05/23/2017    Lab Results  Component Value Date   CREATININE 0.88 05/16/2017     Lab Results  Component Value Date   TESTOSTERONE 352 08/04/2017    Lab Results  Component Value Date   HGBA1C 5.7 05/16/2017    Lab Results  Component Value Date   TSH 1.68 01/19/2016       Component Value Date/Time   CHOL 197 01/19/2016 1131   HDL 45.00 01/19/2016 1131   CHOLHDL 4 01/19/2016 1131   VLDL 22.0 01/19/2016 1131   LDLCALC 130 (H) 01/19/2016 1131    Lab Results  Component Value Date   AST 28 08/04/2017   Lab Results  Component Value Date   ALT 29 08/04/2017   I have reviewed the labs.  Assessment & Plan:    1. Testosterone deficiency    -most recent testosterone level is 352ng/dL on 08/04/2017 (therapeutic levels 450-600)  -restart Clomid 50 mg, 1/2 tablet daily; script given  -RTC in 6 months for HCT, testosterone, PSA, LFT's, ADAM and exam  2. BPH with LUTS  - IPSS score is 2/0, it is Improved  - Continue conservative management, avoiding bladder irritants and timed voiding's  - RTC in 6 months for IPSS, PSA and exam, as testosterone therapy can cause prostate enlargement and worsen LUTS  3. Erectile dysfunction:     -SHIM score is 6, it is worsening  -Continue Trimix    -RTC in 6 months for SHIM score and exam, as testosterone therapy can affect erections     Return in about 6 months (around 02/07/2018) for PSA, LFT's, HCT, HBG, testosterone (before 10 AM), ADAM, IPSS, SHIM and exam.  These notes generated with voice recognition software. I apologize for typographical errors.  Zara Council, [facility] [address] [place], [place] [address] [phone]

## 2017-08-09 ENCOUNTER — Ambulatory Visit (INDEPENDENT_AMBULATORY_CARE_PROVIDER_SITE_OTHER): Payer: Medicaid Other | Admitting: Family Medicine

## 2017-08-09 ENCOUNTER — Encounter: Payer: Self-pay | Admitting: Urology

## 2017-08-09 ENCOUNTER — Encounter: Payer: Self-pay | Admitting: Family Medicine

## 2017-08-09 ENCOUNTER — Ambulatory Visit (INDEPENDENT_AMBULATORY_CARE_PROVIDER_SITE_OTHER): Payer: Medicaid Other | Admitting: Urology

## 2017-08-09 VITALS — BP 150/83 | HR 88 | Ht 70.5 in | Wt 182.6 lb

## 2017-08-09 DIAGNOSIS — N138 Other obstructive and reflux uropathy: Secondary | ICD-10-CM | POA: Diagnosis not present

## 2017-08-09 DIAGNOSIS — E119 Type 2 diabetes mellitus without complications: Secondary | ICD-10-CM | POA: Diagnosis not present

## 2017-08-09 DIAGNOSIS — N401 Enlarged prostate with lower urinary tract symptoms: Secondary | ICD-10-CM

## 2017-08-09 DIAGNOSIS — E349 Endocrine disorder, unspecified: Secondary | ICD-10-CM

## 2017-08-09 DIAGNOSIS — E785 Hyperlipidemia, unspecified: Secondary | ICD-10-CM

## 2017-08-09 DIAGNOSIS — N529 Male erectile dysfunction, unspecified: Secondary | ICD-10-CM

## 2017-08-09 DIAGNOSIS — I10 Essential (primary) hypertension: Secondary | ICD-10-CM | POA: Diagnosis not present

## 2017-08-09 DIAGNOSIS — E291 Testicular hypofunction: Secondary | ICD-10-CM | POA: Insufficient documentation

## 2017-08-09 MED ORDER — CLOMIPHENE CITRATE 50 MG PO TABS: ORAL_TABLET | ORAL | 3 refills | Status: DC

## 2017-08-09 NOTE — Assessment & Plan Note (Signed)
Well-controlled. Plan to check A1c at next visit.

## 2017-08-09 NOTE — Assessment & Plan Note (Signed)
Patient reports he has been out of his losartan for 3-4 days. Prior to that his blood pressure is well-controlled. He'll resume this and contact us in 1 week to let us know what his blood pressures running.

## 2017-08-09 NOTE — Progress Notes (Signed)
  [name], MD Phone: [phone]  Michael Montgomery is a 56 y.o. male who presents today for f/u.  HYPERTENSION Disease Monitoring: Blood pressure range-120/70 Chest pain- no      Dyspnea- no Medications: Compliance- taking amlodipine, losartan   Edema- no  DIABETES Disease Monitoring: Blood Sugar ranges-90-100 Polyuria/phagia/dipsia- no      optho-saw 1 month ago Medications: Compliance- taking metformin 500 bid Hypoglycemic symptoms- no  HYPERLIPIDEMIA Disease Monitoring: See symptoms for Hypertension Medications: Compliance- taking lipitor Right upper quadrant pain- no  Muscle aches- no  Hypogonadism: Following with urology. Currently on Clomid. Notes his testosterones up some.  PMH: Rarely smokes cigars   ROS see history of present illness  Objective  Physical Exam Vitals:   08/09/17 1515  BP: 140/82  Pulse: 88  Temp: 98.6 F (37 C)  SpO2: 97%    BP Readings from Last 3 Encounters:  08/09/17 140/82  08/09/17 (!) 150/83  05/16/17 132/70   Wt Readings from Last 3 Encounters:  08/09/17 183 lb 6.4 oz (83.2 kg)  08/09/17 182 lb 9.6 oz (82.8 kg)  05/16/17 182 lb 9.6 oz (82.8 kg)    Physical Exam  Constitutional: No distress.  Cardiovascular: Normal rate, regular rhythm and normal heart sounds.   Pulmonary/Chest: Effort normal and breath sounds normal.  Musculoskeletal: He exhibits no edema.  Neurological: He is alert.  Skin: He is not diaphoretic.     Assessment/Plan: Please see individual problem list.  Essential hypertension Patient reports he has been out of his losartan for 3-4 days. Prior to that his blood pressure is well-controlled. He'll resume this and contact us in 1 week to let us know what his blood pressures running.  Diabetes ([place]) Well-controlled. Plan to check A1c at next visit.  Hyperlipidemia Continue current medication.  Hypogonadism in male He'll continue to follow with urology.   Orders Placed This Encounter    Procedures  . Lipid panel    Standing Status:   Future    Standing Expiration Date:   08/09/2018    No orders of the defined types were placed in this encounter.    [name], MD [place]

## 2017-08-09 NOTE — Assessment & Plan Note (Signed)
Continue current medication.

## 2017-08-09 NOTE — Patient Instructions (Signed)
Nice to see you. Please continue checking your blood pressure consistently. Please call someone week to let us know what it has been running.

## 2017-08-09 NOTE — Assessment & Plan Note (Signed)
He'll continue to follow with urology.

## 2017-08-11 NOTE — Progress Notes (Signed)
Patient scheduled please place order

## 2017-08-16 ENCOUNTER — Other Ambulatory Visit: Payer: Self-pay

## 2017-08-16 MED ORDER — ATORVASTATIN CALCIUM 40 MG PO TABS: 40.0000 mg | ORAL_TABLET | Freq: Every day | ORAL | 3 refills | Status: DC

## 2017-08-16 MED ORDER — LORATADINE 10 MG PO TABS: 10.0000 mg | ORAL_TABLET | Freq: Every day | ORAL | 3 refills | Status: DC

## 2017-08-16 MED ORDER — METFORMIN HCL 500 MG PO TABS: 500.0000 mg | ORAL_TABLET | Freq: Two times a day (BID) | ORAL | 2 refills | Status: DC

## 2017-08-16 MED ORDER — ACCU-CHEK SOFTCLIX LANCETS MISC: 0 refills | Status: DC

## 2017-08-16 MED ORDER — AMLODIPINE BESYLATE 10 MG PO TABS: 10.0000 mg | ORAL_TABLET | Freq: Every day | ORAL | 1 refills | Status: DC

## 2017-08-16 MED ORDER — ALCOHOL PREP PADS: MEDICATED_PAD | 1 refills | Status: DC

## 2017-08-16 MED ORDER — LOSARTAN POTASSIUM 50 MG PO TABS: 50.0000 mg | ORAL_TABLET | Freq: Every day | ORAL | 3 refills | Status: DC

## 2017-08-16 MED ORDER — GLUCOSE BLOOD VI STRP: 1.0000 | ORAL_STRIP | Freq: Every day | 3 refills | Status: DC

## 2017-08-16 NOTE — Telephone Encounter (Signed)
Sent to pharmacy 

## 2017-08-16 NOTE — Telephone Encounter (Signed)
Patient would like to start using [facility]. Last OV 08/09/17 last filled  Amlodipine 03/13/17 90 1rf Atorvastatin 12/08/16 90 3rf Loratadine 05/02/17 30 11rf Metformin 12/08/16 180 3rf Losartan 05/02/17 90 3rf Strips 06/27/17 100 3rf Lancets 01/13/2017 100 0rf Patient would also like alcohol swabs sent to [facility] for testing

## 2017-08-25 ENCOUNTER — Other Ambulatory Visit (INDEPENDENT_AMBULATORY_CARE_PROVIDER_SITE_OTHER): Payer: Medicaid Other

## 2017-08-25 ENCOUNTER — Other Ambulatory Visit: Payer: Self-pay | Admitting: Family Medicine

## 2017-08-25 DIAGNOSIS — E785 Hyperlipidemia, unspecified: Secondary | ICD-10-CM | POA: Diagnosis not present

## 2017-08-25 LAB — LIPID PANEL
Cholesterol: 102 mg/dL (ref 0–200)
HDL: 35.7 mg/dL — ABNORMAL LOW (ref >39.00)
LDL Cholesterol: 48 mg/dL (ref 0–99)
NonHDL: 66.78
Total CHOL/HDL Ratio: 3
Triglycerides: 93 mg/dL (ref 0.0–149.0)
VLDL: 18.6 mg/dL (ref 0.0–40.0)

## 2017-10-19 ENCOUNTER — Telehealth: Payer: Self-pay

## 2017-10-19 NOTE — Telephone Encounter (Signed)
Copied from [place]. Topic: Referral - Request >> Oct 19, 2017 11:16 AM Michael Montgomery wrote: Reason for CRM: PT is requesting a referral to a dermatologist for a break out on his face he has had 3 weeks he thinks its because he uses a dye on his beard

## 2017-10-19 NOTE — Telephone Encounter (Signed)
Patient is coming in tomorrow at 75

## 2017-10-19 NOTE — Telephone Encounter (Signed)
I am happy to refer him to a dermatologist though we could also see him in the office to evaluate first.  You could place him on the schedule at 3:15 tomorrow if he is available at that time.  Thanks.

## 2017-10-19 NOTE — Telephone Encounter (Signed)
Please advise 

## 2017-10-20 ENCOUNTER — Encounter: Payer: Self-pay | Admitting: Family Medicine

## 2017-10-20 ENCOUNTER — Ambulatory Visit (INDEPENDENT_AMBULATORY_CARE_PROVIDER_SITE_OTHER): Payer: Medicaid Other | Admitting: Family Medicine

## 2017-10-20 ENCOUNTER — Other Ambulatory Visit: Payer: Self-pay

## 2017-10-20 DIAGNOSIS — F330 Major depressive disorder, recurrent, mild: Secondary | ICD-10-CM | POA: Insufficient documentation

## 2017-10-20 DIAGNOSIS — L0292 Furuncle, unspecified: Secondary | ICD-10-CM

## 2017-10-20 DIAGNOSIS — F321 Major depressive disorder, single episode, moderate: Secondary | ICD-10-CM

## 2017-10-20 DIAGNOSIS — F325 Major depressive disorder, single episode, in full remission: Secondary | ICD-10-CM | POA: Insufficient documentation

## 2017-10-20 MED ORDER — DOXYCYCLINE HYCLATE 100 MG PO TABS: 100.0000 mg | ORAL_TABLET | Freq: Two times a day (BID) | ORAL | 0 refills | Status: DC

## 2017-10-20 MED ORDER — ESCITALOPRAM OXALATE 10 MG PO TABS: 10.0000 mg | ORAL_TABLET | Freq: Every day | ORAL | 3 refills | Status: DC

## 2017-10-20 NOTE — Assessment & Plan Note (Signed)
Depression noted.  Patient interested in treatment.  Will start on Lexapro.  Return in 2 months.  Given return precautions.

## 2017-10-20 NOTE — Progress Notes (Signed)
[name], MD Phone: [phone]  Michael Montgomery is a 56 y.o. male who presents today for same-day visit.  Patient notes rash developed on his face after he grew beard out and then shaved.  He has had several bumps pop up on the left side of his face.  Several of them have drained purulent material and then dried up.  He has one prominent nodule that has not drained anything.  No fevers.  No redness.  Minimal discomfort.  He does use a clean razor and changes it frequently.  Depression: Patient notes he has felt depressed for some time now.  Mostly related to not being able to work and just does not feel like doing very much of anything.  Not sleeping all that well.  No SI.  He is interested in medication.  Depression screen [facility] 2/9 10/20/2017 10/20/2017 10/04/2016  Decreased Interest 2 0 0  Down, Depressed, Hopeless 1 0 0  PHQ - 2 Score 3 0 0  Altered sleeping 3 - -  Tired, decreased energy 3 - -  Change in appetite 3 - -  Feeling bad or failure about yourself  0 - -  Trouble concentrating 2 - -  Moving slowly or fidgety/restless 0 - -  Suicidal thoughts 0 - -  PHQ-9 Score 14 - -  Difficult doing work/chores Somewhat difficult - -     Social History   Tobacco Use  Smoking Status Former Smoker  . Types: Cigars  Smokeless Tobacco Former Systems developer  Tobacco Comment   one cigar once a month to once every 2 months      ROS see history of present illness  Objective  Physical Exam Vitals:   10/20/17 1505  BP: 140/86  Pulse: 85  Temp: 98.2 F (36.8 C)  SpO2: 97%    BP Readings from Last 3 Encounters:  10/20/17 140/86  08/09/17 140/82  08/09/17 (!) 150/83   Wt Readings from Last 3 Encounters:  10/20/17 189 lb 9.6 oz (86 kg)  08/09/17 183 lb 6.4 oz (83.2 kg)  08/09/17 182 lb 9.6 oz (82.8 kg)    Physical Exam  Constitutional: No distress.  HENT:  Head:    No lesions on the inside of his mouth left cheek  Pulmonary/Chest: Effort normal.  Skin: He is not  diaphoretic.     Assessment/Plan: Please see individual problem list.  Furuncle Suspect folliculitis barbae leading to a furuncle.  No fluctuance.  Will cover with doxycycline.  Use warm compresses.  Advised to avoid shaving until this heals.  If worsens should be reevaluated.  Given return precautions.  Depression, major, single episode, moderate ([place]) Depression noted.  Patient interested in treatment.  Will start on Lexapro.  Return in 2 months.  Given return precautions.   [name] was seen today for rash.  Diagnoses and all orders for this visit:  Furuncle  Depression, major, single episode, moderate ([place])  Other orders -     doxycycline (VIBRA-TABS) 100 MG tablet; Take 1 tablet (100 mg total) by mouth 2 (two) times daily. -     escitalopram (LEXAPRO) 10 MG tablet; Take 1 tablet (10 mg total) by mouth daily.    No orders of the defined types were placed in this encounter.   Meds ordered this encounter  Medications  . doxycycline (VIBRA-TABS) 100 MG tablet    Sig: Take 1 tablet (100 mg total) by mouth 2 (two) times daily.    Dispense:  14 tablet    Refill:  0  . escitalopram (LEXAPRO) 10 MG tablet    Sig: Take 1 tablet (10 mg total) by mouth daily.    Dispense:  30 tablet    Refill:  [place], MD [place]

## 2017-10-20 NOTE — Assessment & Plan Note (Signed)
Suspect folliculitis barbae leading to a furuncle.  No fluctuance.  Will cover with doxycycline.  Use warm compresses.  Advised to avoid shaving until this heals.  If worsens should be reevaluated.  Given return precautions.

## 2017-10-20 NOTE — Patient Instructions (Signed)
Nice to see you. Please start on the doxycycline.  Please use a warm compress for 10-15 minutes 2-3 times a day.  If you develop a worsening lesion or you develop spreading redness or fevers please be evaluated immediately.  If you develop thoughts of harming your self please go to the emergency room.

## 2017-10-31 ENCOUNTER — Ambulatory Visit (INDEPENDENT_AMBULATORY_CARE_PROVIDER_SITE_OTHER): Payer: Medicaid Other | Admitting: Family Medicine

## 2017-10-31 ENCOUNTER — Encounter: Payer: Self-pay | Admitting: Family Medicine

## 2017-10-31 DIAGNOSIS — L0292 Furuncle, unspecified: Secondary | ICD-10-CM

## 2017-10-31 DIAGNOSIS — F321 Major depressive disorder, single episode, moderate: Secondary | ICD-10-CM | POA: Diagnosis not present

## 2017-10-31 DIAGNOSIS — I10 Essential (primary) hypertension: Secondary | ICD-10-CM

## 2017-10-31 NOTE — Assessment & Plan Note (Signed)
Very well controlled today.  Intermittently elevated over the last several weeks.  Suspect related to inactivity and dietary indiscretion.  He is going to get back to normal diet.  He will get back to exercising.  He will monitor over the next 2 weeks and if still elevated consider increasing losartan or adding additional medication.  Nurse visit in 2 weeks.

## 2017-10-31 NOTE — Patient Instructions (Signed)
Nice to see you. Please check your blood pressure daily for the next 2 weeks and we will have you come in for a nurse visit for blood pressure check.  If you notice over the next week your blood pressure is running elevated please contact us and we can alter your blood pressure regimen as well. Please monitor the depression and if it worsens please let us know.

## 2017-10-31 NOTE — Progress Notes (Signed)
  Michael Rumps, MD Phone: [phone]  Michael Montgomery is a 57 y.o. male who presents today for follow-up.  Hypertension: Intermittently running up into the 150s-160s/90s.  This has been on 5-6 occasions over the last 2 weeks.  Previously was well controlled.  He has been eating saltier food and exercising less.  He notes in the past when he has been exercising his blood pressure has been well controlled.  He continues on amlodipine and losartan.  No chest pain, shortness of breath, or edema.  Depression: He notes this is improved after just a week of the Lexapro.  He is much less irritable with regards to his wife.  He notes no SI or HI.  He notes the furuncle on his face has improved significantly.  He completed the antibiotics.  It has not worsened again.  Social History   Tobacco Use  Smoking Status Former Smoker  . Types: Cigars  Smokeless Tobacco Former Systems developer  Tobacco Comment   one cigar once a month to once every 2 months      ROS see history of present illness  Objective  Physical Exam Vitals:   10/31/17 1322  BP: 110/66  Pulse: 87  Temp: 98.3 F (36.8 C)  SpO2: 98%    BP Readings from Last 3 Encounters:  10/31/17 110/66  10/20/17 140/86  08/09/17 140/82   Wt Readings from Last 3 Encounters:  10/31/17 187 lb (84.8 kg)  10/20/17 189 lb 9.6 oz (86 kg)  08/09/17 183 lb 6.4 oz (83.2 kg)    Physical Exam  Constitutional: No distress.  HENT:  Scar from prior furuncle noted, no significant swelling or tenderness, no tenderness, no erythema, no fluctuance  Cardiovascular: Normal rate, regular rhythm and normal heart sounds.  Pulmonary/Chest: Effort normal and breath sounds normal.  Neurological: He is alert. Gait normal.  Skin: Skin is warm and dry. He is not diaphoretic.     Assessment/Plan: Please see individual problem list.  Essential hypertension Very well controlled today.  Intermittently elevated over the last several weeks.  Suspect related to  inactivity and dietary indiscretion.  He is going to get back to normal diet.  He will get back to exercising.  He will monitor over the next 2 weeks and if still elevated consider increasing losartan or adding additional medication.  Nurse visit in 2 weeks.  Furuncle Resolved.  Monitor for recurrence.  Depression, major, single episode, moderate ([place]) Improved in a very short time.  He will continue Lexapro.  Continue to monitor.   Michael Montgomery was seen today for follow-up.  Diagnoses and all orders for this visit:  Essential hypertension  Furuncle  Depression, major, single episode, moderate ([place])    No orders of the defined types were placed in this encounter.   No orders of the defined types were placed in this encounter.    Michael Rumps, MD [place]

## 2017-10-31 NOTE — Assessment & Plan Note (Signed)
Improved in a very short time.  He will continue Lexapro.  Continue to monitor.

## 2017-10-31 NOTE — Assessment & Plan Note (Signed)
Resolved. Monitor for recurrence. 

## 2017-11-03 ENCOUNTER — Encounter: Payer: Self-pay | Admitting: Podiatry

## 2017-11-03 ENCOUNTER — Ambulatory Visit: Payer: Medicaid Other | Admitting: Podiatry

## 2017-11-03 DIAGNOSIS — M79676 Pain in unspecified toe(s): Secondary | ICD-10-CM

## 2017-11-03 DIAGNOSIS — E119 Type 2 diabetes mellitus without complications: Secondary | ICD-10-CM

## 2017-11-03 DIAGNOSIS — M79609 Pain in unspecified limb: Principal | ICD-10-CM

## 2017-11-03 DIAGNOSIS — B351 Tinea unguium: Secondary | ICD-10-CM

## 2017-11-03 NOTE — Progress Notes (Signed)
Complaint:  Visit Type: Patient returns to my office for continued preventative foot care services. Complaint: Patient states" my nails have grown long and thick and become painful to walk and wear shoes" Patient has been diagnosed with DM with no foot complications. The patient presents for preventative foot care services. No changes to ROS  Podiatric Exam: Vascular: dorsalis pedis and posterior tibial pulses are palpable bilateral. Capillary return is immediate. Temperature gradient is WNL. Skin turgor WNL  Sensorium: Normal Semmes Weinstein monofilament test. Normal tactile sensation bilaterally. Nail Exam: Pt has thick disfigured discolored nails with subungual debris noted bilateral entire nail hallux through fifth toenails Ulcer Exam: There is no evidence of ulcer or pre-ulcerative changes or infection. Orthopedic Exam: Muscle tone and strength are WNL. No limitations in general ROM. No crepitus or effusions noted. Hallux limitus  Right.  Hammer toes 2-4  B/L Skin: No Porokeratosis. No infection or ulcers  Diagnosis:  Onychomycosis, , Pain in right toe, pain in left toes  Treatment & Plan Procedures and Treatment: Consent by patient was obtained for treatment procedures. The patient understood the discussion of treatment and procedures well. All questions were answered thoroughly reviewed. Debridement of mycotic and hypertrophic toenails, 1 through 5 bilateral and clearing of subungual debris. No ulceration, no infection noted.  Return Visit-Office Procedure: Patient instructed to return to the office for a follow up visit 3 months for continued evaluation and treatment.    Gardiner Barefoot DPM

## 2017-12-02 ENCOUNTER — Other Ambulatory Visit: Payer: Self-pay | Admitting: Family Medicine

## 2017-12-08 IMAGING — MR MR LUMBAR SPINE W/O CM
5 series · 47 of 48 positions shown · non-contrast
Comparison: Lumbar spine radiographs 01/21/2016

CLINICAL DATA: Severe left leg pain from the upper thigh to the
knee. Fall 1 month ago.

EXAM:
MRI LUMBAR SPINE WITHOUT CONTRAST
TECHNIQUE: Multiplanar, multisequence MR imaging of the lumbar spine was
performed. No intravenous contrast was administered.

[Series 3: T2 · sagittal · 4.0mm · 0.88mm/px · 5 of 13 slices shown (1 of 2)]
[im 1/13]
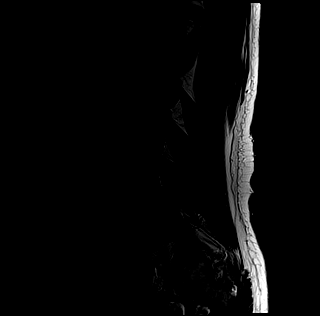
[im 4/13]
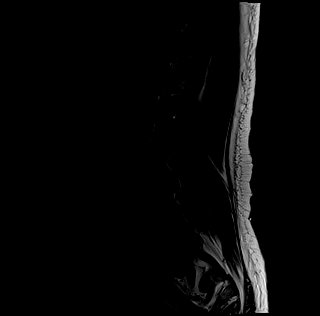
[im 7/13]
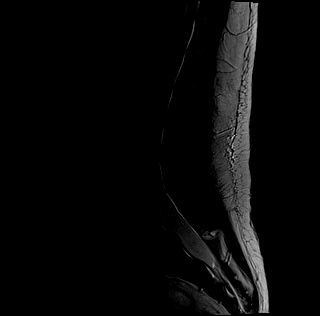
[im 10/13]
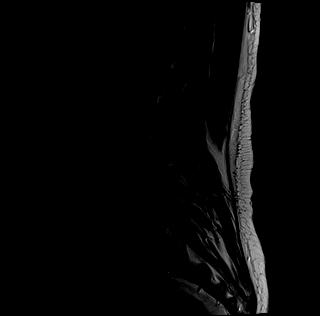
[im 13/13]
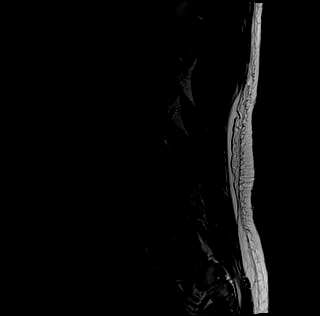

[Series 4: tirm sag · sagittal · 4.0mm · 0.55mm/px · 5 of 13 slices shown]
[im 1/13]
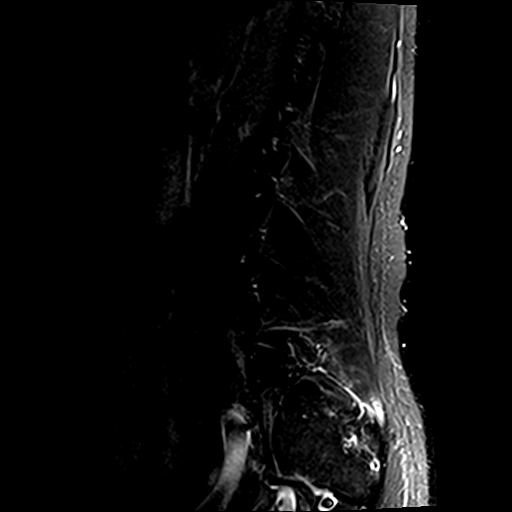
[im 4/13]
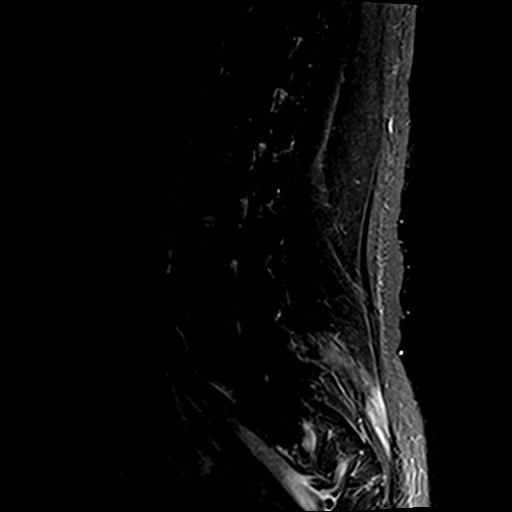
[im 7/13]
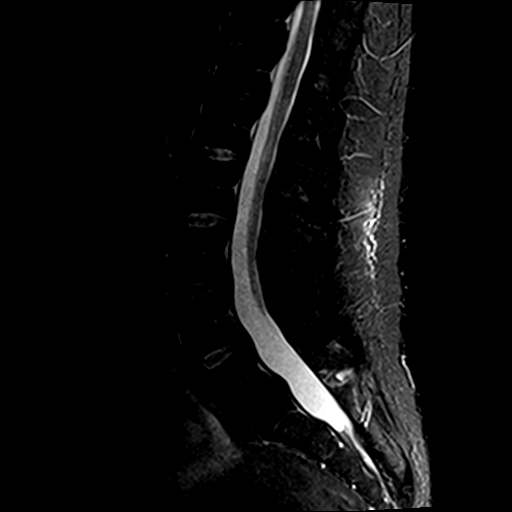
[im 10/13]
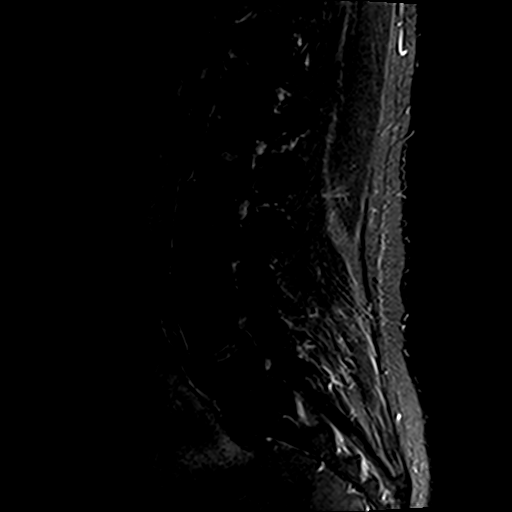
[im 13/13]
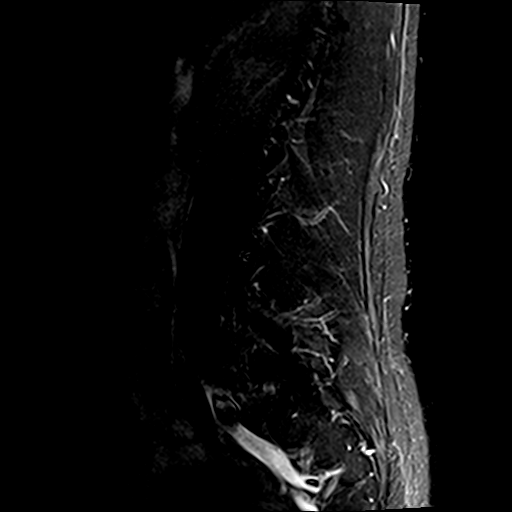

[Series 5: T1 · sagittal · 4.0mm · 0.88mm/px · 6 of 13 slices shown (1 of 2)]
[im 1/13]
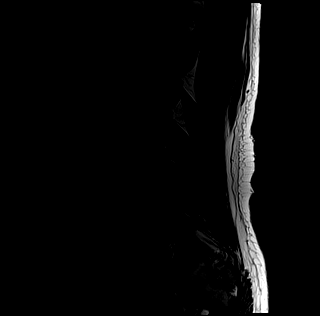
[im 3/13]
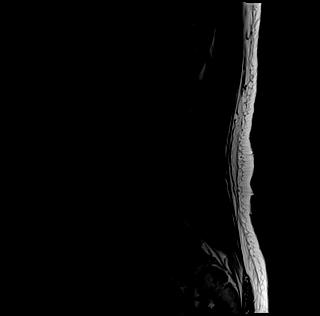
[im 5/13]
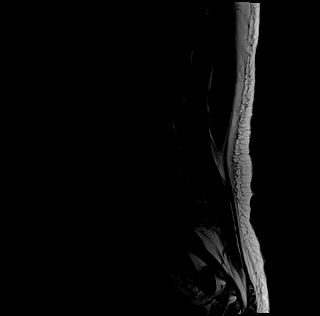
[im 8/13]
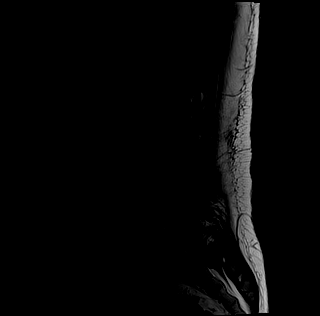
[im 10/13]
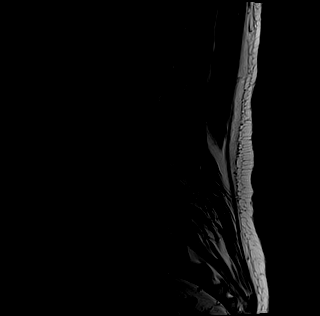
[im 13/13]
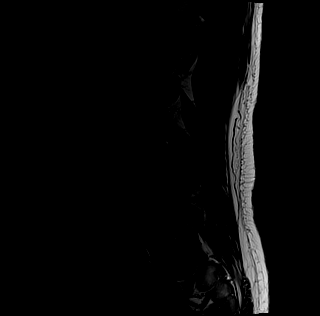

[Series 6: T1 · axial · 4.0mm · 0.70mm/px · z∈[-67,+131]mm · 15 of 36 slices shown (2 of 2)]
[im 1/36]
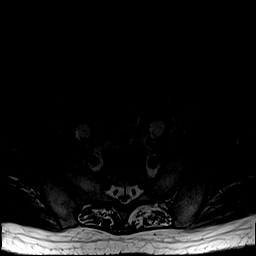
[im 3/36]
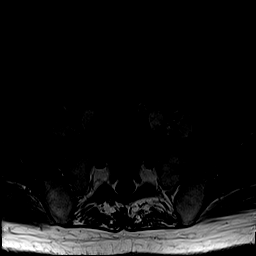
[im 5/36]
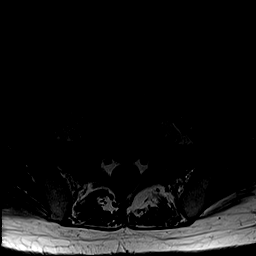
[im 8/36]
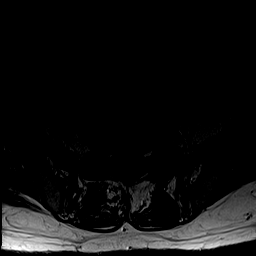
[im 10/36]
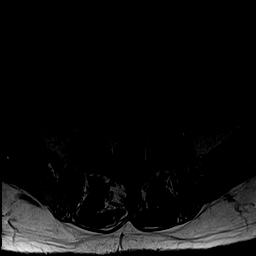
[im 12/36]
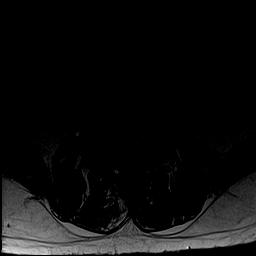
[im 15/36]
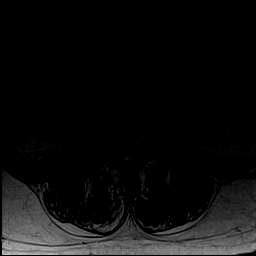
[im 17/36]
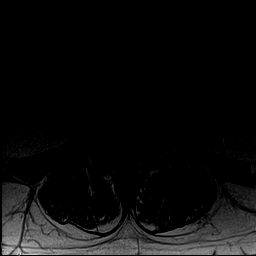
[im 19/36]
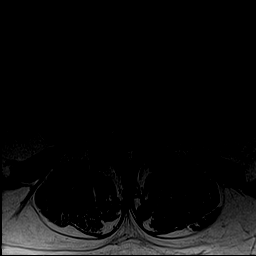
[im 22/36]
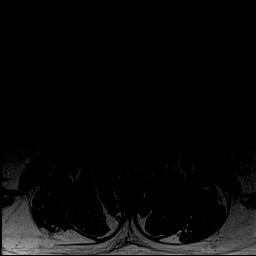
[im 24/36]
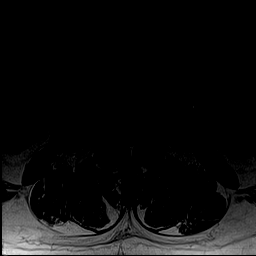
[im 26/36]
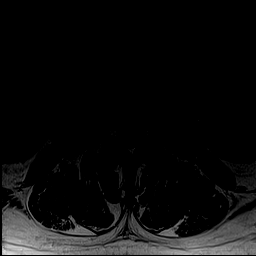
[im 29/36]
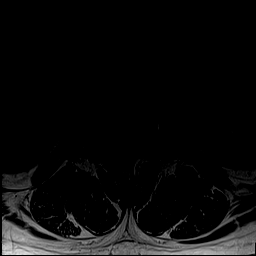
[im 31/36]
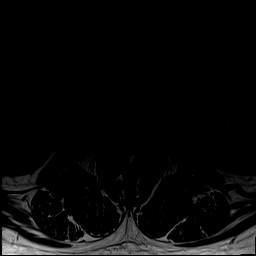
[im 36/36]
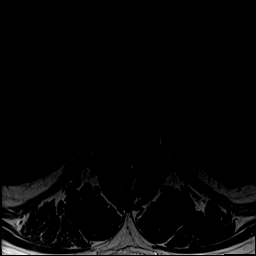

[Series 7: T2 · axial · 4.0mm · 0.70mm/px · z∈[-67,+131]mm · 16 of 36 slices shown (2 of 2)]
[im 1/36]
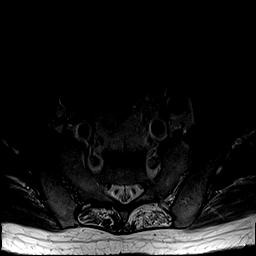
[im 3/36]
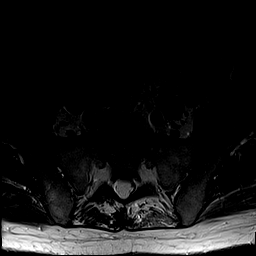
[im 5/36]
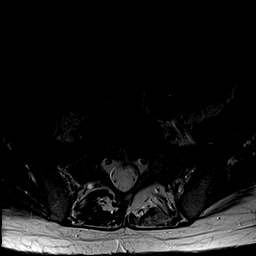
[im 8/36]
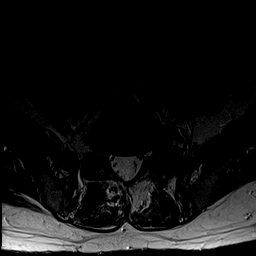
[im 10/36]
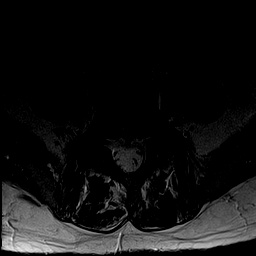
[im 12/36]
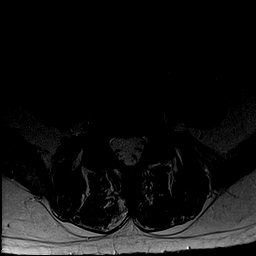
[im 15/36]
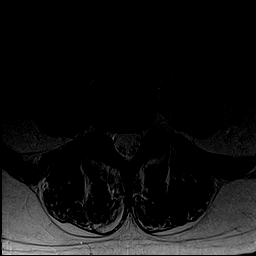
[im 17/36]
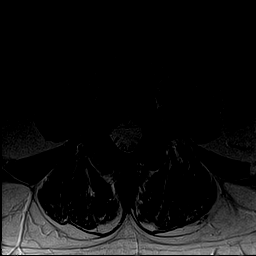
[im 19/36]
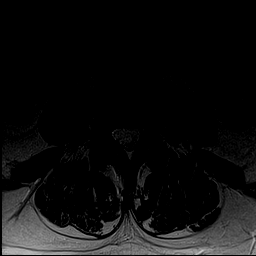
[im 22/36]
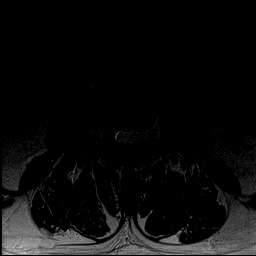
[im 24/36]
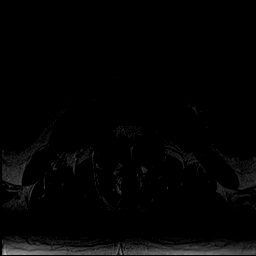
[im 26/36]
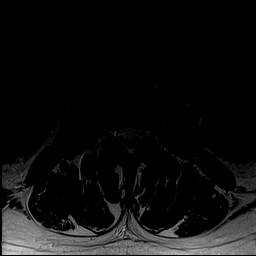
[im 29/36]
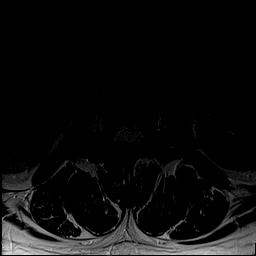
[im 31/36]
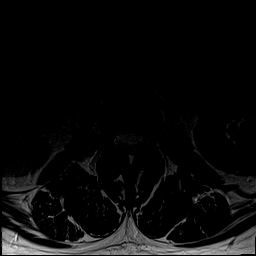
[im 33/36]
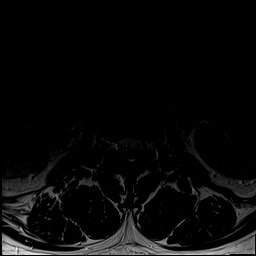
[im 36/36]
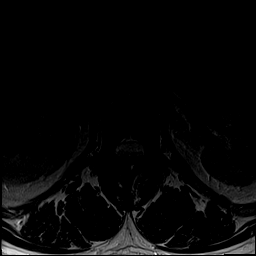

[47 of 48 positions shown; findings below may reference images not displayed]

FINDINGS: For the purposes of this exam, the lowest fully formed vertebral
body is labeled L5 as it was on the radiographs. Normal signal is
present in the conus medullaris which terminates at L1. Mild
endplate marrow changes are present at L5-S1.

Limited imaging of the abdomen is unremarkable. There is no
significant adenopathy.

The disc levels at L2-3 and above are normal.

L3-4: A mild broad-based disc protrusion is present. Mild facet
hypertrophy is noted bilaterally.

L4-5: A mild broad-based disc protrusion is present. Moderate facet
hypertrophy is noted bilaterally. This results in mild bilateral
foraminal narrowing.

L5-S1: A mild broad-based disc bulge is present. Asymmetric
left-sided facet hypertrophy is noted. This results in moderate to
severe left foraminal stenosis. Mild right foraminal narrowing is
evident.
IMPRESSION: 1. Moderate to severe left foraminal stenosis secondary to
transitional anatomy and advanced facet hypertrophy.
2. Mild right foraminal narrowing at L5-S1.
3. Mild foraminal narrowing bilaterally at L4-5.

## 2017-12-16 ENCOUNTER — Other Ambulatory Visit: Payer: Self-pay | Admitting: Family Medicine

## 2017-12-20 ENCOUNTER — Ambulatory Visit: Payer: Medicaid Other | Admitting: Family Medicine

## 2017-12-29 ENCOUNTER — Ambulatory Visit: Payer: Medicaid Other | Admitting: Family Medicine

## 2018-01-18 ENCOUNTER — Other Ambulatory Visit: Payer: Self-pay | Admitting: Family Medicine

## 2018-01-19 ENCOUNTER — Other Ambulatory Visit: Payer: Self-pay | Admitting: Family Medicine

## 2018-02-02 ENCOUNTER — Ambulatory Visit: Payer: Medicaid Other | Admitting: Podiatry

## 2018-02-05 ENCOUNTER — Other Ambulatory Visit: Payer: Medicaid Other

## 2018-02-05 ENCOUNTER — Other Ambulatory Visit: Payer: Self-pay

## 2018-02-05 ENCOUNTER — Encounter: Payer: Self-pay | Admitting: Urology

## 2018-02-05 DIAGNOSIS — E349 Endocrine disorder, unspecified: Secondary | ICD-10-CM

## 2018-02-07 ENCOUNTER — Ambulatory Visit: Payer: Medicaid Other | Admitting: Urology

## 2018-02-07 ENCOUNTER — Encounter: Payer: Self-pay | Admitting: Podiatry

## 2018-02-07 ENCOUNTER — Other Ambulatory Visit: Payer: Medicaid Other

## 2018-02-07 ENCOUNTER — Ambulatory Visit: Payer: Medicaid Other | Admitting: Podiatry

## 2018-02-07 DIAGNOSIS — M79676 Pain in unspecified toe(s): Secondary | ICD-10-CM

## 2018-02-07 DIAGNOSIS — M79609 Pain in unspecified limb: Principal | ICD-10-CM

## 2018-02-07 DIAGNOSIS — B351 Tinea unguium: Secondary | ICD-10-CM | POA: Diagnosis not present

## 2018-02-07 DIAGNOSIS — E119 Type 2 diabetes mellitus without complications: Secondary | ICD-10-CM

## 2018-02-07 DIAGNOSIS — E349 Endocrine disorder, unspecified: Secondary | ICD-10-CM

## 2018-02-07 NOTE — Progress Notes (Signed)
Complaint:  Visit Type: Patient returns to my office for continued preventative foot care services. Complaint: Patient states" my nails have grown long and thick and become painful to walk and wear shoes" Patient has been diagnosed with DM with no foot complications. The patient presents for preventative foot care services. No changes to ROS  Podiatric Exam: Vascular: dorsalis pedis and posterior tibial pulses are palpable bilateral. Capillary return is immediate. Temperature gradient is WNL. Skin turgor WNL  Sensorium: Normal Semmes Weinstein monofilament test. Normal tactile sensation bilaterally. Nail Exam: Pt has thick disfigured discolored nails with subungual debris noted bilateral entire nail hallux through fifth toenails Ulcer Exam: There is no evidence of ulcer or pre-ulcerative changes or infection. Orthopedic Exam: Muscle tone and strength are WNL. No limitations in general ROM. No crepitus or effusions noted. Hallux limitus  Right.  Hammer toes 2-4  B/L Skin: No Porokeratosis. No infection or ulcers  Diagnosis:  Onychomycosis, , Pain in right toe, pain in left toes  Treatment & Plan Procedures and Treatment: Consent by patient was obtained for treatment procedures. The patient understood the discussion of treatment and procedures well. All questions were answered thoroughly reviewed. Debridement of mycotic and hypertrophic toenails, 1 through 5 bilateral and clearing of subungual debris. No ulceration, no infection noted.  Return Visit-Office Procedure: Patient instructed to return to the office for a follow up visit 3 months for continued evaluation and treatment.    Gardiner Barefoot DPM

## 2018-02-08 ENCOUNTER — Telehealth: Payer: Self-pay

## 2018-02-08 LAB — HEPATIC FUNCTION PANEL
ALT: 44 IU/L (ref 0–44)
AST: 63 IU/L — ABNORMAL HIGH (ref 0–40)
Albumin: 4.3 g/dL (ref 3.5–5.5)
Alkaline Phosphatase: 113 IU/L (ref 39–117)
Bilirubin Total: 0.4 mg/dL (ref 0.0–1.2)
Bilirubin, Direct: 0.09 mg/dL (ref 0.00–0.40)
Total Protein: 7.9 g/dL (ref 6.0–8.5)

## 2018-02-08 LAB — TESTOSTERONE: Testosterone: 333 ng/dL (ref 264–916)

## 2018-02-08 LAB — PSA: Prostate Specific Ag, Serum: 0.3 ng/mL (ref 0.0–4.0)

## 2018-02-08 LAB — HEMOGLOBIN: Hemoglobin: 14.2 g/dL (ref 13.0–17.7)

## 2018-02-08 LAB — HEMATOCRIT: Hematocrit: 44.2 % (ref 37.5–51.0)

## 2018-02-08 NOTE — Telephone Encounter (Signed)
Spoke with patient and notified him. He states he has not taken Clomid in over a year and that he is currently not taking anything for Low T. Should he follow up with PCP in regards to elevated LFT?

## 2018-02-08 NOTE — Telephone Encounter (Signed)
-----   Message from Nori Riis, PA-C sent at 02/08/2018  8:00 AM EDT ----- Michael Montgomery's LFT's are elevated.  He will need to stop the Clomid at this time.  We will need to change to a different testosterone therapy if he is still interested in treatments.

## 2018-02-09 NOTE — Telephone Encounter (Signed)
Yes.  He should she his PCP regarding his elevation in his LFT's.

## 2018-02-12 NOTE — Telephone Encounter (Signed)
Pt informed, he will f/u w/ pcp.

## 2018-02-19 ENCOUNTER — Other Ambulatory Visit: Payer: Self-pay

## 2018-02-19 ENCOUNTER — Ambulatory Visit (INDEPENDENT_AMBULATORY_CARE_PROVIDER_SITE_OTHER): Payer: Medicaid Other | Admitting: Family Medicine

## 2018-02-19 ENCOUNTER — Encounter: Payer: Self-pay | Admitting: Family Medicine

## 2018-02-19 VITALS — BP 138/88 | HR 83 | Temp 98.1°F | Ht 71.0 in | Wt 192.0 lb

## 2018-02-19 DIAGNOSIS — R7989 Other specified abnormal findings of blood chemistry: Secondary | ICD-10-CM

## 2018-02-19 DIAGNOSIS — R945 Abnormal results of liver function studies: Secondary | ICD-10-CM

## 2018-02-19 DIAGNOSIS — Z1159 Encounter for screening for other viral diseases: Secondary | ICD-10-CM

## 2018-02-19 DIAGNOSIS — I10 Essential (primary) hypertension: Secondary | ICD-10-CM

## 2018-02-19 DIAGNOSIS — E119 Type 2 diabetes mellitus without complications: Secondary | ICD-10-CM

## 2018-02-19 DIAGNOSIS — F325 Major depressive disorder, single episode, in full remission: Secondary | ICD-10-CM | POA: Diagnosis not present

## 2018-02-19 DIAGNOSIS — M7542 Impingement syndrome of left shoulder: Secondary | ICD-10-CM | POA: Insufficient documentation

## 2018-02-19 NOTE — Assessment & Plan Note (Signed)
Asymptomatic currently.  Continue Lexapro.

## 2018-02-19 NOTE — Assessment & Plan Note (Signed)
Well-controlled at home.  Continue current regimen. 

## 2018-02-19 NOTE — Patient Instructions (Signed)
Nice to see you. Please try the exercises for your shoulder.  You can take Aleve over-the-counter 1 pill twice daily for the next 3 to 4 days on a schedule and then twice daily as needed. We will check lab work today and contact you with the results.   Shoulder Impingement Syndrome Rehab Ask your health care provider which exercises are safe for you. Do exercises exactly as told by your health care provider and adjust them as directed. It is normal to feel mild stretching, pulling, tightness, or discomfort as you do these exercises, but you should stop right away if you feel sudden pain or your pain gets worse.Do not begin these exercises until told by your health care provider. Stretching and range of motion exercise This exercise warms up your muscles and joints and improves the movement and flexibility of your shoulder. This exercise also helps to relieve pain and stiffness. Exercise A: Passive horizontal adduction  1. Sit or stand and pull your left / right elbow across your chest, toward your other shoulder. Stop when you feel a gentle stretch in the back of your shoulder and upper arm. ? Keep your arm at shoulder height. ? Keep your arm as close to your body as you comfortably can. 2. Hold for __________ seconds. 3. Slowly return to the starting position. Repeat __________ times. Complete this exercise __________ times a day. Strengthening exercises These exercises build strength and endurance in your shoulder. Endurance is the ability to use your muscles for a long time, even after they get tired. Exercise B: External rotation, isometric 1. Stand or sit in a doorway, facing the door frame. 2. Bend your left / right elbow and place the back of your wrist against the door frame. Only your wrist should be touching the frame. Keep your upper arm at your side. 3. Gently press your wrist against the door frame, as if you are trying to push your arm away from your abdomen. ? Avoid shrugging  your shoulder while you press your hand against the door frame. Keep your shoulder blade tucked down toward the middle of your back. 4. Hold for __________ seconds. 5. Slowly release the tension, and relax your muscles completely before you do the exercise again. Repeat __________ times. Complete this exercise __________ times a day. Exercise C: Internal rotation, isometric  1. Stand or sit in a doorway, facing the door frame. 2. Bend your left / right elbow and place the inside of your wrist against the door frame. Only your wrist should be touching the frame. Keep your upper arm at your side. 3. Gently press your wrist against the door frame, as if you are trying to push your arm toward your abdomen. ? Avoid shrugging your shoulder while you press your hand against the door frame. Keep your shoulder blade tucked down toward the middle of your back. 4. Hold for __________ seconds. 5. Slowly release the tension, and relax your muscles completely before you do the exercise again. Repeat __________ times. Complete this exercise __________ times a day. Exercise D: Scapular protraction, supine  1. Lie on your back on a firm surface. Hold a __________ weight in your left / right hand. 2. Raise your left / right arm straight into the air so your hand is directly above your shoulder joint. 3. Push the weight into the air so your shoulder lifts off of the surface that you are lying on. Do not move your head, neck, or back. 4. Hold for __________  seconds. 5. Slowly return to the starting position. Let your muscles relax completely before you repeat this exercise. Repeat __________ times. Complete this exercise __________ times a day. Exercise E: Scapular retraction  1. Sit in a stable chair without armrests, or stand. 2. Secure an exercise band to a stable object in front of you so the band is at shoulder height. 3. Hold one end of the exercise band in each hand. Your palms should face  down. 4. Squeeze your shoulder blades together and move your elbows slightly behind you. Do not shrug your shoulders while you do this. 5. Hold for __________ seconds. 6. Slowly return to the starting position. Repeat __________ times. Complete this exercise __________ times a day. Exercise F: Shoulder extension  1. Sit in a stable chair without armrests, or stand. 2. Secure an exercise band to a stable object in front of you where the band is above shoulder height. 3. Hold one end of the exercise band in each hand. 4. Straighten your elbows and lift your hands up to shoulder height. 5. Squeeze your shoulder blades together and pull your hands down to the sides of your thighs. Stop when your hands are straight down by your sides. Do not let your hands go behind your body. 6. Hold for __________ seconds. 7. Slowly return to the starting position. Repeat __________ times. Complete this exercise __________ times a day. This information is not intended to replace advice given to you by your health care provider. Make sure you discuss any questions you have with your health care provider. Document Released: 10/10/2005 Document Revised: 06/16/2016 Document Reviewed: 09/12/2015 Elsevier Interactive Patient Education  [employer].

## 2018-02-19 NOTE — Assessment & Plan Note (Signed)
Well-controlled at home.  Continue metformin.  Check lab work.

## 2018-02-19 NOTE — Progress Notes (Signed)
  [name], MD Phone: [phone]  Michael Montgomery is a 57 y.o. male who presents today for f/u.  HYPERTENSION  Disease Monitoring  Home BP Monitoring 120/78 Chest pain- no    Dyspnea- no Medications  Compliance-  Taking amlodipine, losartan.   Edema- no  DIABETES Disease Monitoring: Blood Sugar ranges-80-110 Polyuria/phagia/dipsia- no      Optho- UTD, will request records Medications: Compliance- taking metformin Hypoglycemic symptoms- no  Depression: Patient notes no depressive symptoms.  No SI.  He continues on Lexapro.  Patient reports left shoulder discomfort particularly with active abduction, internal rotation, and external rotation.  Notes it aches when he lifts it.  He notes no injury.  Possibly has gotten worse.  He has not done anything for this.     Social History   Tobacco Use  Smoking Status Former Smoker  . Types: Cigars  Smokeless Tobacco Former Systems developer  Tobacco Comment   one cigar once a month to once every 2 months      ROS see history of present illness  Objective  Physical Exam Vitals:   02/19/18 1443  BP: 138/88  Pulse: 83  Temp: 98.1 F (36.7 C)  SpO2: 97%    BP Readings from Last 3 Encounters:  02/19/18 138/88  10/31/17 110/66  10/20/17 140/86   Wt Readings from Last 3 Encounters:  02/19/18 192 lb (87.1 kg)  10/31/17 187 lb (84.8 kg)  10/20/17 189 lb 9.6 oz (86 kg)    Physical Exam  Constitutional: No distress.  Cardiovascular: Normal rate, regular rhythm and normal heart sounds.  Pulmonary/Chest: Effort normal and breath sounds normal.  Musculoskeletal: He exhibits no edema.  Slight discomfort over the left anterior lateral deltoid on palpation, no other tenderness in the left or right shoulder, full range of motion actively and passively bilateral shoulders, left shoulder with discomfort on active and passive internal and external range of motion as well as abduction, positive empty can and speeds on the left    Neurological: He is alert.  Skin: Skin is warm and dry. He is not diaphoretic.     Assessment/Plan: Please see individual problem list.  Essential hypertension Well-controlled at home.  Continue current regimen.  Diabetes ([place]) Well-controlled at home.  Continue metformin.  Check lab work.  Depression, major, single episode, complete remission ([place]) Asymptomatic currently.  Continue Lexapro.  Rotator cuff impingement syndrome of left shoulder Symptoms and exam most consistent with left rotator cuff impingement.  Discussed anti-inflammatory (Aleve) use on a twice daily schedule for the next 3 to 4 days and then twice daily as needed.  Advised to take this with food.  Given exercises to complete.  If not improving would refer to sports medicine.  Orders Placed This Encounter  Procedures  . HgB A1c  . Comp Met (CMET)  . Hepatitis C Antibody    No orders of the defined types were placed in this encounter.    [name], MD [place]

## 2018-02-19 NOTE — Assessment & Plan Note (Addendum)
Symptoms and exam most consistent with left rotator cuff impingement.  Discussed anti-inflammatory (Aleve) use on a twice daily schedule for the next 3 to 4 days and then twice daily as needed.  Advised to take this with food.  Given exercises to complete.  If not improving would refer to sports medicine.

## 2018-02-20 ENCOUNTER — Ambulatory Visit: Payer: Medicaid Other | Admitting: Urology

## 2018-02-20 ENCOUNTER — Telehealth: Payer: Self-pay | Admitting: Family Medicine

## 2018-02-20 LAB — HEPATITIS C ANTIBODY
Hepatitis C Ab: NONREACTIVE
SIGNAL TO CUT-OFF: 0.03 (ref <1.00)

## 2018-02-20 LAB — COMPREHENSIVE METABOLIC PANEL
ALT: 29 U/L (ref 0–53)
AST: 28 U/L (ref 0–37)
Albumin: 4.1 g/dL (ref 3.5–5.2)
Alkaline Phosphatase: 101 U/L (ref 39–117)
BUN: 14 mg/dL (ref 6–23)
CO2: 28 mEq/L (ref 19–32)
Calcium: 9.3 mg/dL (ref 8.4–10.5)
Chloride: 102 mEq/L (ref 96–112)
Creatinine, Ser: 0.83 mg/dL (ref 0.40–1.50)
GFR: 123.7 mL/min (ref >60.00)
Glucose, Bld: 221 mg/dL — ABNORMAL HIGH (ref 70–99)
Potassium: 3.9 mEq/L (ref 3.5–5.1)
Sodium: 138 mEq/L (ref 135–145)
Total Bilirubin: 0.4 mg/dL (ref 0.2–1.2)
Total Protein: 7.2 g/dL (ref 6.0–8.3)

## 2018-02-20 LAB — HEMOGLOBIN A1C: Hgb A1c MFr Bld: 5.9 % (ref 4.6–6.5)

## 2018-02-20 NOTE — Telephone Encounter (Signed)
Copied from [place] [id number]. Topic: Quick Communication - Lab Results >> Feb 20, 2018  1:18 PM Juanda Chance, [place] wrote: Left message to return call, ok for [facility] to give results and speak to patient  >> Feb 20, 2018  1:31 PM Waylan Rocher, [place] wrote: No [facility] RN free for results.

## 2018-02-20 NOTE — Telephone Encounter (Signed)
Called pt and results given.

## 2018-02-23 ENCOUNTER — Ambulatory Visit: Payer: Medicaid Other | Admitting: Family Medicine

## 2018-03-20 ENCOUNTER — Other Ambulatory Visit: Payer: Self-pay | Admitting: Family Medicine

## 2018-03-20 ENCOUNTER — Ambulatory Visit: Payer: Medicaid Other | Admitting: Urology

## 2018-03-27 ENCOUNTER — Ambulatory Visit: Payer: Medicaid Other | Admitting: Urology

## 2018-04-14 NOTE — Progress Notes (Deleted)
3:27 PM   Michael Montgomery [birth year] [id number]  Referring provider: Leone Haven, MD [address] [place], [place] [address]  No chief complaint on file.   HPI: 57 yo AAM who with testosterone deficiency, ED and BPH with LU TS who presents for a 6 month follow up.    Testosterone deficiency He is no longer having spontaneous erections at night.  He does not have sleep apnea.   His current testosterone level was 333 ng/dL on 02/07/2018.  He was instructed to stop his Clomid therapy due to an elevation of his LFT"s.  His current LFT's are normal on 02/19/2018.  His HCT was 44.2% and Hbg was 14.2 on 02/07/2018.       Erectile dysfunction ***      BPH WITH LUTS His previous I PSS score was 2/0.  His major complaint today nocturia x 1.  He has had these symptoms for several years.  He denies any dysuria, hematuria or suprapubic pain.  He also denies any recent fevers, chills, nausea or vomiting.  He does not have a family history of PCa.  ***   PMH: Past Medical History:  Diagnosis Date  . Cataract march, april   bilateral removed   . Chickenpox   . Diabetes mellitus without complication (HCC)    metformin   . 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 Gastric ulcer   . Heart murmur    When he was younger, no recent issues  . High blood pressure    controlled with meds  . Hypercholesteremia    controlled with medication  . Knee injury    left  . Motion sickness    boats    Surgical History: Past Surgical History:  Procedure Laterality Date  . CATARACT EXTRACTION W/PHACO Right 02/01/2016   Procedure: CATARACT EXTRACTION PHACO AND INTRAOCULAR LENS PLACEMENT ([place]) right;  Surgeon: Ronnell Freshwater, MD;  Location: [facility];  Service: Ophthalmology;  Laterality: Right;  DIABETIC - oral meds  . CATARACT EXTRACTION W/PHACO Left 03/07/2016   Procedure: CATARACT EXTRACTION PHACO AND INTRAOCULAR LENS PLACEMENT ([facility]);  Surgeon:  Ronnell Freshwater, MD;  Location: [place];  Service: Ophthalmology;  Laterality: Left;  DIABETIC - oral meds   . circumscision      Home Medications:  Allergies as of 04/16/2018   No Known Allergies     Medication List        Accurate as of 04/14/18  3:27 PM. Always use your most recent med list.          ACCU-CHEK SOFTCLIX LANCETS lancets Used to check blood glucose once daily   Alcohol Prep Pads Use once daily when checking blood sugar   AMITIZA 24 MCG capsule Generic drug:  lubiprostone Take 24 mcg by mouth 2 (two) times daily.   amLODipine 10 MG tablet Commonly known as:  NORVASC TAKE 1 TABLET (10 MG TOTAL) BY MOUTH DAILY.   atorvastatin 40 MG tablet Commonly known as:  LIPITOR Take 1 tablet (40 mg total) by mouth daily.   clomiPHENE 50 MG tablet Commonly known as:  CLOMID Take 1/2 tablet daily   escitalopram 10 MG tablet Commonly known as:  LEXAPRO TAKE 1 TABLET BY MOUTH EVERY DAY   FISH OIL PO Take by mouth.   fluticasone 50 MCG/ACT nasal spray Commonly known as:  FLONASE Place 2 sprays into both nostrils daily.   gabapentin 300 MG capsule Commonly known  as:  NEURONTIN Take 1 capsule by mouth 2 (two) times daily. Take 1 cap in AM and 2 caps at bedtime   glucose blood test strip Commonly known as:  ACCU-CHEK AVIVA PLUS 1 each by Other route daily. Use as instructed   ACCU-CHEK AVIVA PLUS test strip Generic drug:  glucose blood USE AS DIRECTED   loratadine 10 MG tablet Commonly known as:  CLARITIN Take 1 tablet (10 mg total) by mouth daily.   losartan 50 MG tablet Commonly known as:  COZAAR Take 1 tablet (50 mg total) by mouth daily.   metFORMIN 500 MG tablet Commonly known as:  GLUCOPHAGE TAKE 2 TABLETS IN MORNING AND 1 TABLET AT NIGHT   multivitamin capsule Take 1 capsule by mouth daily.   PROAIR HFA 108 (90 Base) MCG/ACT inhaler Generic drug:  albuterol 2 INH INHALATION EVERY FOUR TO SIX HOURS AS NEEDED        Allergies: No Known Allergies  Family History: Family History  Problem Relation Age of Onset  . Alcoholism Father   . Hyperlipidemia Father   . Hypertension Father   . Throat cancer Father        smoked but had stopped 20 yeras before dx  . Diabetes Mother   . Kidney disease Neg Hx   . Prostate cancer Neg Hx   . Colon cancer Neg Hx   . Colon polyps Neg Hx   . Esophageal cancer Neg Hx   . Rectal cancer Neg Hx   . Stomach cancer Neg Hx   . Kidney cancer Neg Hx   . Bladder Cancer Neg Hx     Social History:  reports that he has quit smoking. His smoking use included cigars. He has quit using smokeless tobacco. He reports that he drinks alcohol. He reports that he does not use drugs.  ROS:                                        Physical Exam: There were no vitals taken for this visit.  Constitutional: Well nourished. Alert and oriented, No acute distress. HEENT: [place] AT, moist mucus membranes. Trachea midline, no masses. Cardiovascular: No clubbing, cyanosis, or edema. Respiratory: Normal respiratory effort, no increased work of breathing. GI: Abdomen is soft, non tender, non distended, no abdominal masses. Liver and spleen not palpable.  No hernias appreciated.  Stool sample for occult testing is not indicated.   GU: No CVA tenderness.  No bladder fullness or masses.  Patient with uncircumcised phallus. Foreskin easily retracted.  Urethral meatus is patent.  No penile discharge. No penile lesions or rashes. Vitiligo on glans.  Scrotum without lesions, cysts, rashes and/or edema.  Testicles are located scrotally bilaterally. No masses are appreciated in the testicles. Left and right epididymis are normal. Rectal: Deferred.   Skin: No rashes, bruises or suspicious lesions. Lymph: No cervical or inguinal adenopathy. Neurologic: Grossly intact, no focal deficits, moving all 4 extremities. Psychiatric: Normal mood and affect. ***  Constitutional: Well  nourished. Alert and oriented, No acute distress. HEENT: [place] AT, moist mucus membranes. Trachea midline, no masses. Cardiovascular: No clubbing, cyanosis, or edema. Respiratory: Normal respiratory effort, no increased work of breathing. GI: Abdomen is soft, non tender, non distended, no abdominal masses. Liver and spleen not palpable.  No hernias appreciated.  Stool sample for occult testing is not indicated.   GU: No CVA tenderness.  No  bladder fullness or masses.  Patient with circumcised/uncircumcised phallus. ***Foreskin easily retracted***  Urethral meatus is patent.  No penile discharge. No penile lesions or rashes. Scrotum without lesions, cysts, rashes and/or edema.  Testicles are located scrotally bilaterally. No masses are appreciated in the testicles. Left and right epididymis are normal. Rectal: Patient with  normal sphincter tone. Anus and perineum without scarring or rashes. No rectal masses are appreciated. Prostate is approximately *** grams, *** nodules are appreciated. Seminal vesicles are normal. Skin: No rashes, bruises or suspicious lesions. Lymph: No cervical or inguinal adenopathy. Neurologic: Grossly intact, no focal deficits, moving all 4 extremities. Psychiatric: Normal mood and affect.   Laboratory Data: PSA History  0.3 ng/mL on 01/24/2017  0.4 ng/mL on 08/04/2017  0.3 ng/mL on 02/07/2018  Lab Results  Component Value Date   WBC 4.1 05/23/2017   HGB 14.2 02/07/2018   HCT 44.2 02/07/2018   MCV 90.3 05/23/2017   PLT 173.0 05/23/2017    Lab Results  Component Value Date   CREATININE 0.83 02/19/2018     Lab Results  Component Value Date   TESTOSTERONE 333 02/07/2018    Lab Results  Component Value Date   HGBA1C 5.9 02/19/2018    Lab Results  Component Value Date   TSH 1.68 01/19/2016       Component Value Date/Time   CHOL 102 08/25/2017 0937   HDL 35.70 (L) 08/25/2017 0937   CHOLHDL 3 08/25/2017 0937   VLDL 18.6 08/25/2017 0937   LDLCALC  48 08/25/2017 0937    Lab Results  Component Value Date   AST 28 02/19/2018   Lab Results  Component Value Date   ALT 29 02/19/2018   I have reviewed the labs.  Assessment & Plan:    1. Testosterone deficiency    -most recent testosterone level is 333 ng/dL on 02/07/2018 (therapeutic levels 450-600)  -restart Clomid 50 mg, 1/2 tablet daily; script given  -RTC in 6 months for HCT, testosterone, PSA, LFT's, ADAM and exam  2. BPH with LUTS  - IPSS score is 2/0, it is Improved  - Continue conservative management, avoiding bladder irritants and timed voiding's  - RTC in 6 months for IPSS, PSA and exam, as testosterone therapy can cause prostate enlargement and worsen LUTS  3. Erectile dysfunction:    Continue Trimix RTC in 6 months for exam, as testosterone therapy can affect erections     No follow-ups on file.  These notes generated with voice recognition software. I apologize for typographical errors.  Zara Council, PA-C  [facility] [address] [place], [place] [address] [phone]

## 2018-04-16 ENCOUNTER — Ambulatory Visit: Payer: Medicaid Other | Admitting: Urology

## 2018-04-18 NOTE — Progress Notes (Signed)
12:16 PM   Michael Montgomery [birth year] [id number]  Referring provider: Leone Haven, MD [address] [place], [place] [address]  No chief complaint on file.   HPI: 57 yo AAM who with testosterone deficiency, ED and BPH with LU TS who presents for a 6 month follow up.    Testosterone deficiency He is no longer having spontaneous erections at night.  He does not have sleep apnea.   His current testosterone level was 333 ng/dL on 02/07/2018.  He was instructed to stop his Clomid therapy due to an elevation of his LFT"s. His current LFT's are normal on 02/19/2018.  His HCT was 44.2% and Hbg was 14.2 on 02/07/2018.       Erectile dysfunction He needs a refill on the Trimex medication.  He is also interested in a penile prosthesis.   BPH WITH LUTS He does not have any urinary complaints at this time.  Patient denies any gross hematuria, dysuria or suprapubic/flank pain.  Patient denies any fevers, chills, nausea or vomiting.   He does not have a family history of prostate cancer.   PMH: Past Medical History:  Diagnosis Date  . Cataract march, april   bilateral removed   . Chickenpox   . Diabetes mellitus without complication (HCC)    metformin   . 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 Gastric ulcer   . Heart murmur    When he was younger, no recent issues  . High blood pressure    controlled with meds  . Hypercholesteremia    controlled with medication  . Knee injury    left  . Motion sickness    boats    Surgical History: Past Surgical History:  Procedure Laterality Date  . CATARACT EXTRACTION W/PHACO Right 02/01/2016   Procedure: CATARACT EXTRACTION PHACO AND INTRAOCULAR LENS PLACEMENT ([place]) right;  Surgeon: Ronnell Freshwater, MD;  Location: [facility];  Service: Ophthalmology;  Laterality: Right;  DIABETIC - oral meds  . CATARACT EXTRACTION W/PHACO Left 03/07/2016   Procedure: CATARACT EXTRACTION PHACO AND INTRAOCULAR  LENS PLACEMENT ([facility]);  Surgeon: Ronnell Freshwater, MD;  Location: [facility];  Service: Ophthalmology;  Laterality: Left;  DIABETIC - oral meds   . circumscision      Home Medications:  Allergies as of 04/19/2018   No Known Allergies     Medication List        Accurate as of 04/19/18 12:16 PM. Always use your most recent med list.          ACCU-CHEK SOFTCLIX LANCETS lancets Used to check blood glucose once daily   Alcohol Prep Pads Use once daily when checking blood sugar   AMITIZA 24 MCG capsule Generic drug:  lubiprostone Take 24 mcg by mouth 2 (two) times daily.   amLODipine 10 MG tablet Commonly known as:  NORVASC TAKE 1 TABLET (10 MG TOTAL) BY MOUTH DAILY.   atorvastatin 40 MG tablet Commonly known as:  LIPITOR Take 1 tablet (40 mg total) by mouth daily.   clomiPHENE 50 MG tablet Commonly known as:  CLOMID Take 1/2 tablet daily   escitalopram 10 MG tablet Commonly known as:  LEXAPRO TAKE 1 TABLET BY MOUTH EVERY DAY   FISH OIL PO Take by mouth.   fluticasone 50 MCG/ACT nasal spray Commonly known as:  FLONASE Place 2 sprays into both nostrils daily.   gabapentin 300 MG capsule Commonly known as:  NEURONTIN Take  1 capsule by mouth 2 (two) times daily. Take 1 cap in AM and 2 caps at bedtime   glucose blood test strip Commonly known as:  ACCU-CHEK AVIVA PLUS 1 each by Other route daily. Use as instructed   ACCU-CHEK AVIVA PLUS test strip Generic drug:  glucose blood USE AS DIRECTED   loratadine 10 MG tablet Commonly known as:  CLARITIN Take 1 tablet (10 mg total) by mouth daily.   losartan 50 MG tablet Commonly known as:  COZAAR Take 1 tablet (50 mg total) by mouth daily.   metFORMIN 500 MG tablet Commonly known as:  GLUCOPHAGE TAKE 2 TABLETS IN MORNING AND 1 TABLET AT NIGHT   multivitamin capsule Take 1 capsule by mouth daily.   PROAIR HFA 108 (90 Base) MCG/ACT inhaler Generic drug:  albuterol 2 INH INHALATION EVERY FOUR  TO SIX HOURS AS NEEDED       Allergies: No Known Allergies  Family History: Family History  Problem Relation Age of Onset  . Alcoholism Father   . Hyperlipidemia Father   . Hypertension Father   . Throat cancer Father        smoked but had stopped 20 yeras before dx  . Diabetes Mother   . Kidney disease Neg Hx   . Prostate cancer Neg Hx   . Colon cancer Neg Hx   . Colon polyps Neg Hx   . Esophageal cancer Neg Hx   . Rectal cancer Neg Hx   . Stomach cancer Neg Hx   . Kidney cancer Neg Hx   . Bladder Cancer Neg Hx     Social History:  reports that he has quit smoking. His smoking use included cigars. He has quit using smokeless tobacco. He reports that he drinks alcohol. He reports that he does not use drugs.  ROS: UROLOGY Frequent Urination?: No Hard to postpone urination?: No Burning/pain with urination?: No Get up at night to urinate?: No Leakage of urine?: No Urine stream starts and stops?: No Trouble starting stream?: No Do you have to strain to urinate?: No Blood in urine?: No Urinary tract infection?: No Sexually transmitted disease?: No Injury to kidneys or bladder?: No Painful intercourse?: No Weak stream?: No Erection problems?: Yes Penile pain?: No  Gastrointestinal Nausea?: No Vomiting?: No Indigestion/heartburn?: No Diarrhea?: No Constipation?: No  Constitutional Fever: No Night sweats?: No Weight loss?: No Fatigue?: No  Skin Skin rash/lesions?: No Itching?: No  Eyes Blurred vision?: No Double vision?: No  Ears/Nose/Throat Sore throat?: No Sinus problems?: No  Hematologic/Lymphatic Swollen glands?: No Easy bruising?: No  Cardiovascular Leg swelling?: No Chest pain?: No  Respiratory Cough?: No Shortness of breath?: No  Endocrine Excessive thirst?: No  Musculoskeletal Back pain?: No Joint pain?: No  Neurological Headaches?: No Dizziness?: No  Psychologic Depression?: No Anxiety?: No  Physical Exam: BP 127/81    Pulse 88   Resp 16   Ht 5\' 11"  (1.803 m)   Wt 195 lb (88.5 kg)   SpO2 96%   BMI 27.20 kg/m   Constitutional: Well nourished. Alert and oriented, No acute distress. HEENT: [place] AT, moist mucus membranes. Trachea midline, no masses. Cardiovascular: No clubbing, cyanosis, or edema. Respiratory: Normal respiratory effort, no increased work of breathing. GI: Abdomen is soft, non tender, non distended, no abdominal masses. Liver and spleen not palpable.  No hernias appreciated.  Stool sample for occult testing is not indicated.   GU: No CVA tenderness.  No bladder fullness or masses.  Patient with uncircumcised phallus.  Foreskin easily retracted.  Urethral meatus is patent.  No penile discharge. No penile lesions or rashes. Scrotum without lesions, cysts, rashes and/or edema.  Testicles are located scrotally bilaterally. No masses are appreciated in the testicles. Left and right epididymis are normal. Rectal: Patient with  normal sphincter tone. Anus and perineum without scarring or rashes. No rectal masses are appreciated. Prostate is approximately 45 grams, no nodules are appreciated. Seminal vesicles are normal. Skin: No rashes, bruises or suspicious lesions. Lymph: No cervical or inguinal adenopathy. Neurologic: Grossly intact, no focal deficits, moving all 4 extremities. Psychiatric: Normal mood and affect.   Laboratory Data: PSA History  0.3 ng/mL on 01/24/2017  0.4 ng/mL on 08/04/2017  0.3 ng/mL on 02/07/2018  Lab Results  Component Value Date   WBC 4.1 05/23/2017   HGB 14.2 02/07/2018   HCT 44.2 02/07/2018   MCV 90.3 05/23/2017   PLT 173.0 05/23/2017    Lab Results  Component Value Date   CREATININE 0.83 02/19/2018     Lab Results  Component Value Date   TESTOSTERONE 333 02/07/2018    Lab Results  Component Value Date   HGBA1C 5.9 02/19/2018    Lab Results  Component Value Date   TSH 1.68 01/19/2016       Component Value Date/Time   CHOL 102 08/25/2017  0937   HDL 35.70 (L) 08/25/2017 0937   CHOLHDL 3 08/25/2017 0937   VLDL 18.6 08/25/2017 0937   LDLCALC 48 08/25/2017 0937    Lab Results  Component Value Date   AST 28 02/19/2018   Lab Results  Component Value Date   ALT 29 02/19/2018   I have reviewed the labs.  Assessment & Plan:    1. Testosterone deficiency   Most recent testosterone level is 333 ng/dL on 02/07/2018 (therapeutic levels 450-600) Restart Clomid 50 mg, 1/2 tablet daily; script given - RTC in one monthfor  RTC in 6 months for HCT, testosterone, PSA, LFT's, ADAM and exam  2. BPH with LUTS Continue conservative management, avoiding bladder irritants and timed voiding's RTC in 6 months for IPSS, PSA and exam, as testosterone therapy can cause prostate enlargement and worsen LUTS  3. Erectile dysfunction:    Continue Trimix RTC in 6 months for exam, as testosterone therapy can affect erections     Return in about 1 month (around 05/19/2018) for testosterone level and LFT's in the am .  These notes generated with voice recognition software. I apologize for typographical errors.  Zara Council, PA-C  [facility] [address] [place], [place] [address] [phone]

## 2018-04-19 ENCOUNTER — Telehealth: Payer: Self-pay | Admitting: Urology

## 2018-04-19 ENCOUNTER — Encounter: Payer: Self-pay | Admitting: Urology

## 2018-04-19 ENCOUNTER — Ambulatory Visit (INDEPENDENT_AMBULATORY_CARE_PROVIDER_SITE_OTHER): Payer: Medicaid Other | Admitting: Urology

## 2018-04-19 VITALS — BP 127/81 | HR 88 | Resp 16 | Ht 71.0 in | Wt 195.0 lb

## 2018-04-19 DIAGNOSIS — N401 Enlarged prostate with lower urinary tract symptoms: Secondary | ICD-10-CM | POA: Diagnosis not present

## 2018-04-19 DIAGNOSIS — N138 Other obstructive and reflux uropathy: Secondary | ICD-10-CM | POA: Diagnosis not present

## 2018-04-19 DIAGNOSIS — N529 Male erectile dysfunction, unspecified: Secondary | ICD-10-CM | POA: Diagnosis not present

## 2018-04-19 DIAGNOSIS — E349 Endocrine disorder, unspecified: Secondary | ICD-10-CM | POA: Diagnosis not present

## 2018-04-19 MED ORDER — CLOMIPHENE CITRATE 50 MG PO TABS: ORAL_TABLET | ORAL | 3 refills | Status: DC

## 2018-04-19 NOTE — Telephone Encounter (Signed)
Would you call in a script for Trimix for [place] to [place]?

## 2018-04-23 NOTE — Telephone Encounter (Signed)
[facility]ed to [facility]

## 2018-05-03 ENCOUNTER — Other Ambulatory Visit: Payer: Self-pay | Admitting: Family Medicine

## 2018-05-04 ENCOUNTER — Other Ambulatory Visit: Payer: Self-pay | Admitting: Family Medicine

## 2018-05-15 ENCOUNTER — Other Ambulatory Visit: Payer: Self-pay | Admitting: Family Medicine

## 2018-05-15 DIAGNOSIS — E349 Endocrine disorder, unspecified: Secondary | ICD-10-CM

## 2018-05-17 ENCOUNTER — Other Ambulatory Visit: Payer: Medicaid Other

## 2018-05-17 DIAGNOSIS — E349 Endocrine disorder, unspecified: Secondary | ICD-10-CM

## 2018-05-18 ENCOUNTER — Telehealth: Payer: Self-pay

## 2018-05-18 LAB — HEPATIC FUNCTION PANEL
ALT: 55 IU/L — ABNORMAL HIGH (ref 0–44)
AST: 47 IU/L — ABNORMAL HIGH (ref 0–40)
Albumin: 4.4 g/dL (ref 3.5–5.5)
Alkaline Phosphatase: 126 IU/L — ABNORMAL HIGH (ref 39–117)
Bilirubin Total: 0.6 mg/dL (ref 0.0–1.2)
Bilirubin, Direct: 0.13 mg/dL (ref 0.00–0.40)
Total Protein: 7.3 g/dL (ref 6.0–8.5)

## 2018-05-18 LAB — TESTOSTERONE: Testosterone: 322 ng/dL (ref 264–916)

## 2018-05-18 NOTE — Telephone Encounter (Signed)
-----   Message from Nori Riis, PA-C sent at 05/18/2018  7:55 AM EDT ----- Please let Michael Montgomery know that his LFT's have increased.  He cannot take the Clomid for testosterone therapy.   He may either try the gels or the injections at this time for therapy.

## 2018-05-18 NOTE — Telephone Encounter (Signed)
Pt states he is not currently taking clomid, it was to expensive to purchase. He states as far as labs go, he had eaten a high fat meal, but will follow-up with his pcp to make sure no further testing is needed.

## 2018-06-02 ENCOUNTER — Other Ambulatory Visit: Payer: Self-pay | Admitting: Family Medicine

## 2018-06-12 ENCOUNTER — Other Ambulatory Visit: Payer: Self-pay | Admitting: Family Medicine

## 2018-06-13 ENCOUNTER — Encounter: Payer: Self-pay | Admitting: Podiatry

## 2018-06-13 ENCOUNTER — Ambulatory Visit: Payer: Medicaid Other | Admitting: Podiatry

## 2018-06-13 DIAGNOSIS — B351 Tinea unguium: Secondary | ICD-10-CM

## 2018-06-13 DIAGNOSIS — E119 Type 2 diabetes mellitus without complications: Secondary | ICD-10-CM

## 2018-06-13 DIAGNOSIS — M79609 Pain in unspecified limb: Secondary | ICD-10-CM | POA: Diagnosis not present

## 2018-06-13 NOTE — Progress Notes (Signed)
Complaint:  Visit Type: Patient returns to my office for continued preventative foot care services. Complaint: Patient states" my nails have grown long and thick and become painful to walk and wear shoes" Patient has been diagnosed with DM with no foot complications. The patient presents for preventative foot care services. No changes to ROS  Podiatric Exam: Vascular: dorsalis pedis and posterior tibial pulses are palpable bilateral. Capillary return is immediate. Temperature gradient is WNL. Skin turgor WNL  Sensorium: Normal Semmes Weinstein monofilament test. Normal tactile sensation bilaterally. Nail Exam: Pt has thick disfigured discolored nails with subungual debris noted bilateral entire nail hallux through fifth toenails Ulcer Exam: There is no evidence of ulcer or pre-ulcerative changes or infection. Orthopedic Exam: Muscle tone and strength are WNL. No limitations in general ROM. No crepitus or effusions noted. Hallux limitus  Right.  Hammer toes 2-4  B/L Skin: No Porokeratosis. No infection or ulcers  Diagnosis:  Onychomycosis, , Pain in right toe, pain in left toes  Treatment & Plan Procedures and Treatment: Consent by patient was obtained for treatment procedures. The patient understood the discussion of treatment and procedures well. All questions were answered thoroughly reviewed. Debridement of mycotic and hypertrophic toenails, 1 through 5 bilateral and clearing of subungual debris. No ulceration, no infection noted.  Return Visit-Office Procedure: Patient instructed to return to the office for a follow up visit 4 months for continued evaluation and treatment.    Gardiner Barefoot DPM

## 2018-07-11 ENCOUNTER — Telehealth: Payer: Self-pay | Admitting: Family Medicine

## 2018-07-11 NOTE — Telephone Encounter (Signed)
Rx change request  Can a substitute be sent in for this medication.

## 2018-07-11 NOTE — Telephone Encounter (Signed)
Copied from [place] [id number]. Topic: Quick Communication - See Telephone Encounter >> Jul 11, 2018  5:07 PM Ivar Drape wrote: CRM for notification. See Telephone encounter for: 07/11/18. Patient stated that his Pharmacy [facility] said they are out of the losartan (COZAAR) 50 MG tablets medication and they don't know when they will get it in.  It might be several weeks.  Please advise.

## 2018-07-12 NOTE — Telephone Encounter (Signed)
Called the pt's pharmacy the do have the Losartan 100 MG we could send in a new Rx for 100 Mg half table once daily.   Sent to PCP to approve if OK with sending this in as the alternative for the pt.  Thanks

## 2018-07-13 MED ORDER — LOSARTAN POTASSIUM 100 MG PO TABS: 50.0000 mg | ORAL_TABLET | Freq: Every day | ORAL | 1 refills | Status: DC

## 2018-07-13 NOTE — Telephone Encounter (Signed)
Called and spoke with pt. Pt advised and voiced understanding.  

## 2018-07-13 NOTE — Telephone Encounter (Signed)
New prescription sent to pharmacy 

## 2018-08-18 ENCOUNTER — Other Ambulatory Visit: Payer: Self-pay | Admitting: Family Medicine

## 2018-08-19 ENCOUNTER — Other Ambulatory Visit: Payer: Self-pay | Admitting: Family Medicine

## 2018-08-22 ENCOUNTER — Ambulatory Visit: Payer: Medicaid Other | Admitting: Family Medicine

## 2018-09-01 ENCOUNTER — Other Ambulatory Visit: Payer: Self-pay | Admitting: Family Medicine

## 2018-10-09 DIAGNOSIS — E538 Deficiency of other specified B group vitamins: Secondary | ICD-10-CM | POA: Diagnosis not present

## 2018-10-09 DIAGNOSIS — E519 Thiamine deficiency, unspecified: Secondary | ICD-10-CM | POA: Diagnosis not present

## 2018-10-09 DIAGNOSIS — G6181 Chronic inflammatory demyelinating polyneuritis: Secondary | ICD-10-CM | POA: Diagnosis not present

## 2018-10-09 DIAGNOSIS — R413 Other amnesia: Secondary | ICD-10-CM | POA: Diagnosis not present

## 2018-10-10 ENCOUNTER — Ambulatory Visit: Payer: Medicaid Other | Admitting: Podiatry

## 2018-10-10 ENCOUNTER — Other Ambulatory Visit: Payer: Self-pay | Admitting: Neurology

## 2018-10-10 DIAGNOSIS — R413 Other amnesia: Secondary | ICD-10-CM

## 2018-10-13 ENCOUNTER — Other Ambulatory Visit: Payer: Self-pay | Admitting: Family Medicine

## 2018-10-16 DIAGNOSIS — R899 Unspecified abnormal finding in specimens from other organs, systems and tissues: Secondary | ICD-10-CM | POA: Diagnosis not present

## 2018-10-16 DIAGNOSIS — Z79899 Other long term (current) drug therapy: Secondary | ICD-10-CM | POA: Diagnosis not present

## 2018-10-19 ENCOUNTER — Other Ambulatory Visit: Payer: Self-pay | Admitting: Family Medicine

## 2018-10-27 ENCOUNTER — Ambulatory Visit
Admission: RE | Admit: 2018-10-27 | Discharge: 2018-10-27 | Disposition: A | Discharge status: Home / Self Care | Payer: Medicare Other | Source: Ambulatory Visit | Attending: Neurology | Admitting: Neurology

## 2018-10-27 DIAGNOSIS — R413 Other amnesia: Secondary | ICD-10-CM | POA: Insufficient documentation

## 2018-10-27 MED ORDER — GADOBUTROL 1 MMOL/ML IV SOLN
8.0000 mL | Freq: Once | INTRAVENOUS | Status: AC | PRN
  Administered 2018-10-27: 8 mL via INTRAVENOUS

## 2018-10-31 ENCOUNTER — Other Ambulatory Visit: Payer: Self-pay | Admitting: Family Medicine

## 2018-10-31 MED ORDER — LOSARTAN POTASSIUM 100 MG PO TABS: 50.0000 mg | ORAL_TABLET | Freq: Every day | ORAL | 0 refills | Status: DC

## 2018-10-31 NOTE — Telephone Encounter (Signed)
Copied from [place] [id number]. Topic: Quick Communication - Rx Refill/Question >> Oct 31, 2018  1:37 PM [place] Quan A wrote: Medication: losartan (COZAAR) 100 MG tablet  Has the patient contacted their pharmacy? Yes.   (Agent: If no, request that the patient contact the pharmacy for the refill.) (Agent: If yes, when and what did the pharmacy advise?)  Preferred Pharmacy (with phone number or street name): [id number] [place], [place] [phone] (Phone) [phone] (Fax)    Agent: Please be advised that RX refills may take up to 3 business days. We ask that you follow-up with your pharmacy.

## 2018-10-31 NOTE — Telephone Encounter (Signed)
Requested Prescriptions  Pending Prescriptions Disp Refills  . losartan (COZAAR) 100 MG tablet 45 tablet 0    Sig: Take 0.5 tablets (50 mg total) by mouth daily.     Cardiovascular:  Angiotensin Receptor Blockers Failed - 10/31/2018  1:42 PM      Failed - Cr in normal range and within 180 days    Creatinine, Ser  Date Value Ref Range Status  02/19/2018 0.83 0.40 - 1.50 mg/dL Final         Failed - K in normal range and within 180 days    Potassium  Date Value Ref Range Status  02/19/2018 3.9 3.5 - 5.1 mEq/L Final         Failed - Valid encounter within last 6 months    Recent Outpatient Visits          8 months ago Type 2 diabetes mellitus without complication, without long-term current use of insulin ([place])   [place] Leone Haven, MD   1 year ago Essential hypertension   [place], Angela Adam, MD   1 year ago [place] Leone Haven, MD   1 year ago Essential hypertension   [place] Leone Haven, MD   1 year ago Essential hypertension   [place], Angela Adam, MD      Future Appointments            In 2 weeks Caryl Bis Angela Adam, MD [facility] [place] - Patient is not pregnant      Passed - Last BP in normal range    BP Readings from Last 1 Encounters:  04/19/18 127/81

## 2018-11-14 ENCOUNTER — Ambulatory Visit: Payer: Medicaid Other | Admitting: Family Medicine

## 2018-11-23 ENCOUNTER — Encounter: Payer: Self-pay | Admitting: Podiatry

## 2018-11-23 ENCOUNTER — Ambulatory Visit: Payer: Medicaid Other | Admitting: Podiatry

## 2018-11-23 DIAGNOSIS — M79609 Pain in unspecified limb: Principal | ICD-10-CM

## 2018-11-23 DIAGNOSIS — B351 Tinea unguium: Secondary | ICD-10-CM | POA: Diagnosis not present

## 2018-11-23 DIAGNOSIS — M79676 Pain in unspecified toe(s): Secondary | ICD-10-CM

## 2018-11-23 DIAGNOSIS — E119 Type 2 diabetes mellitus without complications: Secondary | ICD-10-CM

## 2018-11-23 NOTE — Progress Notes (Signed)
Complaint:  Visit Type: Patient returns to my office for continued preventative foot care services. Complaint: Patient states" my nails have grown long and thick and become painful to walk and wear shoes" Patient has been diagnosed with DM with no foot complications. The patient presents for preventative foot care services. No changes to ROS  Podiatric Exam: Vascular: dorsalis pedis and posterior tibial pulses are palpable bilateral. Capillary return is immediate. Temperature gradient is WNL. Skin turgor WNL  Sensorium: Normal Semmes Weinstein monofilament test. Normal tactile sensation bilaterally. Nail Exam: Pt has thick disfigured discolored nails with subungual debris noted bilateral entire nail hallux through fifth toenails Ulcer Exam: There is no evidence of ulcer or pre-ulcerative changes or infection. Orthopedic Exam: Muscle tone and strength are WNL. No limitations in general ROM. No crepitus or effusions noted. Hallux limitus  Right.  Hammer toes 2-4  B/L Skin: No Porokeratosis. No infection or ulcers  Diagnosis:  Onychomycosis, , Pain in right toe, pain in left toes  Treatment & Plan Procedures and Treatment: Consent by patient was obtained for treatment procedures. The patient understood the discussion of treatment and procedures well. All questions were answered thoroughly reviewed. Debridement of mycotic and hypertrophic toenails, 1 through 5 bilateral and clearing of subungual debris. No ulceration, no infection noted.  Return Visit-Office Procedure: Patient instructed to return to the office for a follow up visit 4 months for continued evaluation and treatment.    Gardiner Barefoot DPM

## 2018-12-13 ENCOUNTER — Other Ambulatory Visit: Payer: Self-pay | Admitting: Family Medicine

## 2018-12-16 ENCOUNTER — Other Ambulatory Visit: Payer: Self-pay | Admitting: Family Medicine

## 2018-12-18 DIAGNOSIS — G6181 Chronic inflammatory demyelinating polyneuritis: Secondary | ICD-10-CM | POA: Diagnosis not present

## 2018-12-18 DIAGNOSIS — E118 Type 2 diabetes mellitus with unspecified complications: Secondary | ICD-10-CM | POA: Diagnosis not present

## 2018-12-18 DIAGNOSIS — R413 Other amnesia: Secondary | ICD-10-CM | POA: Diagnosis not present

## 2018-12-18 DIAGNOSIS — E538 Deficiency of other specified B group vitamins: Secondary | ICD-10-CM | POA: Diagnosis not present

## 2018-12-18 DIAGNOSIS — R899 Unspecified abnormal finding in specimens from other organs, systems and tissues: Secondary | ICD-10-CM | POA: Diagnosis not present

## 2018-12-18 DIAGNOSIS — R4589 Other symptoms and signs involving emotional state: Secondary | ICD-10-CM | POA: Diagnosis not present

## 2018-12-18 DIAGNOSIS — E1165 Type 2 diabetes mellitus with hyperglycemia: Secondary | ICD-10-CM | POA: Diagnosis not present

## 2019-01-09 ENCOUNTER — Ambulatory Visit: Payer: Medicaid Other | Admitting: Family Medicine

## 2019-01-14 ENCOUNTER — Other Ambulatory Visit: Payer: Self-pay | Admitting: Family Medicine

## 2019-01-22 ENCOUNTER — Telehealth: Payer: Self-pay | Admitting: Family Medicine

## 2019-01-22 ENCOUNTER — Encounter: Payer: Self-pay | Admitting: Family Medicine

## 2019-01-22 ENCOUNTER — Other Ambulatory Visit: Payer: Self-pay

## 2019-01-22 ENCOUNTER — Encounter

## 2019-01-22 ENCOUNTER — Ambulatory Visit (INDEPENDENT_AMBULATORY_CARE_PROVIDER_SITE_OTHER): Payer: Medicare Other | Admitting: Family Medicine

## 2019-01-22 DIAGNOSIS — F325 Major depressive disorder, single episode, in full remission: Secondary | ICD-10-CM

## 2019-01-22 DIAGNOSIS — E119 Type 2 diabetes mellitus without complications: Secondary | ICD-10-CM

## 2019-01-22 DIAGNOSIS — I10 Essential (primary) hypertension: Secondary | ICD-10-CM | POA: Diagnosis not present

## 2019-01-22 DIAGNOSIS — G6181 Chronic inflammatory demyelinating polyneuritis: Secondary | ICD-10-CM

## 2019-01-22 NOTE — Progress Notes (Signed)
Virtual Visit via Telephone Note  I connected with Michael Montgomery on 01/22/19 at  2:15 PM EDT by telephone and verified that I am speaking with the correct person using two identifiers.   I discussed the limitations, risks, security and privacy concerns of performing an evaluation and management service by telephone and the availability of in person appointments. I also discussed with the patient that there may be a patient responsible charge related to this service. The patient expressed understanding and agreed to proceed.  The patient Michael Montgomery) was at home and I Michael Montgomery) was in the office.  We were the only participants in this visit.  Reason for visit: 51-month follow-up  History of Present Illness: Diabetes: Typically running 130s-160.  Taking metformin.  No polyuria or polydipsia.  No hypoglycemia.  He does report having seen ophthalmology in the last year.  He has tried to watch what he eats as he has not been able to exercise as much given that the gyms are closed.  Hypertension: Blood pressure running 148/70-80.  Taking amlodipine.  Taking losartan 50 mg daily.  No chest pain, shortness of breath, or edema.  CIDP: He continues to see neurology and continues to do home IVIG.  Some days his leg weakness is better than others.  He does exercise to try to strengthen his left leg.  He is trying to do walking for exercise currently.  Depression: He notes no depressive symptoms.  He remains on Lexapro.   Observations/Objective: No physical exam was completed given that this was a telehealth visit.  Assessment and Plan: Essential hypertension Above goal.  Will have him switch his losartan to nighttime dosing to see if that is beneficial.  We will get him set up for a follow-up phone visit in 2 weeks to determine if this has been beneficial.  If not we will plan on increasing losartan.  We will have him come into the office for lab work.  Diabetes ([name]) Relatively well  controlled.  I will continue metformin.  We will have him come in for an A1c.  CIDP (chronic inflammatory demyelinating polyneuropathy) (HCC) This is stable.  He continues IVIG through neurology.  Depression, major, single episode, complete remission ([place]) Asymptomatic.  Continue Lexapro.    Follow Up Instructions: CMA to contact patient to set up 2-week phone visit for follow-up.  She will also set up lab work to be completed in the office.  She will also set up 3 to 2-month follow-up in the office.   I discussed the assessment and treatment plan with the patient. The patient was provided an opportunity to ask questions and all were answered. The patient agreed with the plan and demonstrated an understanding of the instructions.   The patient was advised to call back or seek an in-person evaluation if the symptoms worsen or if the condition fails to improve as anticipated.  I provided 15 minutes of non-face-to-face time during this encounter.   Michael Rumps, MD

## 2019-01-22 NOTE — Assessment & Plan Note (Signed)
Above goal.  Will have him switch his losartan to nighttime dosing to see if that is beneficial.  We will get him set up for a follow-up phone visit in 2 weeks to determine if this has been beneficial.  If not we will plan on increasing losartan.  We will have him come into the office for lab work.

## 2019-01-22 NOTE — Assessment & Plan Note (Signed)
Asymptomatic.  Continue Lexapro. 

## 2019-01-22 NOTE — Telephone Encounter (Signed)
Called and spoke with pt. Pt has been scheduled for lab work and follow up phone visit in 2 weeks.

## 2019-01-22 NOTE — Telephone Encounter (Signed)
Patient seen for a telehealth visit today.  He needs lab work sometime in the next week.  Orders have been placed.  He also needs a follow-up phone visit in 2 weeks to discuss his blood pressure.  He needs in an office visit scheduled for 3 to 4 months.  Thanks.

## 2019-01-22 NOTE — Assessment & Plan Note (Signed)
This is stable.  He continues IVIG through neurology.

## 2019-01-22 NOTE — Assessment & Plan Note (Signed)
Relatively well controlled.  I will continue metformin.  We will have him come in for an A1c.

## 2019-01-31 ENCOUNTER — Other Ambulatory Visit (INDEPENDENT_AMBULATORY_CARE_PROVIDER_SITE_OTHER): Payer: Medicare Other

## 2019-01-31 ENCOUNTER — Other Ambulatory Visit: Payer: Self-pay | Admitting: Family Medicine

## 2019-01-31 ENCOUNTER — Other Ambulatory Visit: Payer: Self-pay

## 2019-01-31 DIAGNOSIS — I10 Essential (primary) hypertension: Secondary | ICD-10-CM

## 2019-01-31 DIAGNOSIS — E119 Type 2 diabetes mellitus without complications: Secondary | ICD-10-CM

## 2019-01-31 LAB — COMPREHENSIVE METABOLIC PANEL
ALT: 217 U/L — ABNORMAL HIGH (ref 0–53)
AST: 179 U/L — ABNORMAL HIGH (ref 0–37)
Albumin: 4.3 g/dL (ref 3.5–5.2)
Alkaline Phosphatase: 116 U/L (ref 39–117)
BUN: 13 mg/dL (ref 6–23)
CO2: 28 mEq/L (ref 19–32)
Calcium: 9.5 mg/dL (ref 8.4–10.5)
Chloride: 101 mEq/L (ref 96–112)
Creatinine, Ser: 0.75 mg/dL (ref 0.40–1.50)
GFR: 130.37 mL/min (ref >60.00)
Glucose, Bld: 152 mg/dL — ABNORMAL HIGH (ref 70–99)
Potassium: 4.6 mEq/L (ref 3.5–5.1)
Sodium: 137 mEq/L (ref 135–145)
Total Bilirubin: 0.6 mg/dL (ref 0.2–1.2)
Total Protein: 7.2 g/dL (ref 6.0–8.3)

## 2019-01-31 LAB — LIPID PANEL
Cholesterol: 132 mg/dL (ref 0–200)
HDL: 37.7 mg/dL — ABNORMAL LOW (ref >39.00)
LDL Cholesterol: 74 mg/dL (ref 0–99)
NonHDL: 93.8
Total CHOL/HDL Ratio: 3
Triglycerides: 101 mg/dL (ref 0.0–149.0)
VLDL: 20.2 mg/dL (ref 0.0–40.0)

## 2019-01-31 LAB — HEMOGLOBIN A1C: Hgb A1c MFr Bld: 6.7 % — ABNORMAL HIGH (ref 4.6–6.5)

## 2019-01-31 MED ORDER — FLUTICASONE PROPIONATE 50 MCG/ACT NA SUSP: 2.0000 | Freq: Every day | NASAL | 6 refills | Status: DC

## 2019-01-31 MED ORDER — AMITIZA 24 MCG PO CAPS: 24.0000 ug | ORAL_CAPSULE | Freq: Two times a day (BID) | ORAL | 5 refills | Status: DC

## 2019-01-31 NOTE — Telephone Encounter (Signed)
Sent to pharmacy 

## 2019-01-31 NOTE — Telephone Encounter (Signed)
Please confirm with the patient what he needs the albuterol for.  It does not appear that he has a history of asthma.  Please also confirm whether or not he has been taking the amitiza as it does not appear that this is been filled by [place] previously.  Thanks.

## 2019-01-31 NOTE — Telephone Encounter (Signed)
Please see my prior note.

## 2019-01-31 NOTE — Telephone Encounter (Signed)
Sent to PCP for approval.  

## 2019-01-31 NOTE — Telephone Encounter (Signed)
Last OV 01/22/2019   Next OV 02/08/2019  Sent to PCP for approval

## 2019-01-31 NOTE — Telephone Encounter (Signed)
Copied from [place] [id number]. Topic: Quick Communication - Rx Refill/Question >> Jan 31, 2019  8:32 AM Sheppard Coil, [employer] wrote: Medication:  AMITIZA 24 MCG capsule albuterol (PROAIR HFA) 108 (90 Base) MCG/ACT inhaler fluticasone (FLONASE) 50 MCG/ACT nasal spray  Has the patient contacted their pharmacy? yes (Agent: If no, request that the patient contact the pharmacy for the refill.) (Agent: If yes, when and what did the pharmacy advise?)  Preferred Pharmacy (with phone number or street name): [id number] - [place], [address] [address] [phone] (Phone) [phone] (Fax)  Agent: Please be advised that RX refills may take up to 3 business days. We ask that you follow-up with your pharmacy.

## 2019-01-31 NOTE — Telephone Encounter (Signed)
Called and spoke with pt. He does not have asthma and DOES NOT need HFA. Pt does take the amitiza and still using flonase. Will need these two Rx's refilled   Sent to PCP

## 2019-02-04 ENCOUNTER — Other Ambulatory Visit: Payer: Self-pay | Admitting: Family Medicine

## 2019-02-04 MED ORDER — AMITIZA 24 MCG PO CAPS: 24.0000 ug | ORAL_CAPSULE | Freq: Two times a day (BID) | ORAL | 5 refills | Status: DC

## 2019-02-04 NOTE — Telephone Encounter (Signed)
[facility] called and spoke to Adin, [facility] about the refill requests. She says they did not receive Amitizia, but received the Flonase.

## 2019-02-05 ENCOUNTER — Other Ambulatory Visit: Payer: Self-pay | Admitting: Family Medicine

## 2019-02-05 DIAGNOSIS — R7989 Other specified abnormal findings of blood chemistry: Secondary | ICD-10-CM

## 2019-02-05 DIAGNOSIS — R945 Abnormal results of liver function studies: Secondary | ICD-10-CM

## 2019-02-07 ENCOUNTER — Other Ambulatory Visit: Payer: Self-pay

## 2019-02-07 ENCOUNTER — Other Ambulatory Visit (INDEPENDENT_AMBULATORY_CARE_PROVIDER_SITE_OTHER): Payer: Medicare Other

## 2019-02-07 DIAGNOSIS — R7989 Other specified abnormal findings of blood chemistry: Secondary | ICD-10-CM

## 2019-02-07 DIAGNOSIS — R945 Abnormal results of liver function studies: Secondary | ICD-10-CM

## 2019-02-07 LAB — HEPATIC FUNCTION PANEL
ALT: 197 U/L — ABNORMAL HIGH (ref 0–53)
AST: 166 U/L — ABNORMAL HIGH (ref 0–37)
Albumin: 4.5 g/dL (ref 3.5–5.2)
Alkaline Phosphatase: 120 U/L — ABNORMAL HIGH (ref 39–117)
Bilirubin, Direct: 0.1 mg/dL (ref 0.0–0.3)
Total Bilirubin: 0.4 mg/dL (ref 0.2–1.2)
Total Protein: 7.4 g/dL (ref 6.0–8.3)

## 2019-02-08 ENCOUNTER — Encounter: Payer: Self-pay | Admitting: Family Medicine

## 2019-02-08 ENCOUNTER — Telehealth: Payer: Self-pay | Admitting: Family Medicine

## 2019-02-08 ENCOUNTER — Ambulatory Visit (INDEPENDENT_AMBULATORY_CARE_PROVIDER_SITE_OTHER): Payer: Medicare Other | Admitting: Family Medicine

## 2019-02-08 DIAGNOSIS — R7989 Other specified abnormal findings of blood chemistry: Secondary | ICD-10-CM

## 2019-02-08 DIAGNOSIS — R945 Abnormal results of liver function studies: Secondary | ICD-10-CM

## 2019-02-08 DIAGNOSIS — K5900 Constipation, unspecified: Secondary | ICD-10-CM

## 2019-02-08 DIAGNOSIS — I10 Essential (primary) hypertension: Secondary | ICD-10-CM

## 2019-02-08 MED ORDER — LOSARTAN POTASSIUM 100 MG PO TABS: 100.0000 mg | ORAL_TABLET | Freq: Every day | ORAL | 1 refills | Status: DC

## 2019-02-08 NOTE — Progress Notes (Signed)
Virtual Visit via video Note  This visit type was conducted due to national recommendations for restrictions regarding the COVID-19 pandemic (e.g. social distancing).  This format is felt to be most appropriate for this patient at this time.  All issues noted in this document were discussed and addressed.  No physical exam was performed (except for noted visual exam findings with Video Visits).   I connected with Michael Montgomery on 02/08/19 at  1:15 PM EDT by a video enabled telemedicine application or telephone and verified that I am speaking with the correct person using two identifiers. Location patient: home Location provider: work Persons participating in the virtual visit: patient, provider  I discussed the limitations, risks, security and privacy concerns of performing an evaluation and management service by telephone and the availability of in person appointments. I also discussed with the patient that there may be a patient responsible charge related to this service. The patient expressed understanding and agreed to proceed.   Reason for visit: Follow-up.  HPI: Hypertension: Typically 140s over 73s.  Taking losartan and amlodipine.  No chest pain, shortness of breath, or edema.  Elevated LFTs: He has had no abdominal pain.  He has cut down on alcohol intake and has not been drinking excessively.  No Tylenol intake.  He does get IVIG every other month.  He had this done a few weeks ago.  On review it appears that his LFTs have been trending up slightly over the past 8 months.  Constipation: He notes being on amitiza it has helped significantly.  He has normal bowel movements daily with this.  Notes his insurance will pay for this now.  When he is not on this he has a bowel movement every 3 days with straining.  No blood in his stool or abdominal pain.   ROS: See pertinent positives and negatives per HPI.  Past Medical History:  Diagnosis Date  . Cataract march, april   bilateral  removed   . Chickenpox   . Diabetes mellitus without complication (HCC)    metformin   . Diabetic amyotrophy [facility])    sees dr at [facility]  . Gastric ulcer   . Heart murmur    When he was younger, no recent issues  . High blood pressure    controlled with meds  . Hypercholesteremia    controlled with medication  . Knee injury    left  . Motion sickness    boats    Past Surgical History:  Procedure Laterality Date  . CATARACT EXTRACTION W/PHACO Right 02/01/2016   Procedure: CATARACT EXTRACTION PHACO AND INTRAOCULAR LENS PLACEMENT ([place]) right;  Surgeon: Ronnell Freshwater, MD;  Location: [facility];  Service: Ophthalmology;  Laterality: Right;  DIABETIC - oral meds  . CATARACT EXTRACTION W/PHACO Left 03/07/2016   Procedure: CATARACT EXTRACTION PHACO AND INTRAOCULAR LENS PLACEMENT ([facility]);  Surgeon: Ronnell Freshwater, MD;  Location: [facility];  Service: Ophthalmology;  Laterality: Left;  DIABETIC - oral meds   . circumscision      Family History  Problem Relation Age of Onset  . Alcoholism Father   . Hyperlipidemia Father   . Hypertension Father   . Throat cancer Father        smoked but had stopped 20 yeras before dx  . Diabetes Mother   . Kidney disease Neg Hx   . Prostate cancer Neg Hx   . Colon cancer Neg Hx   . Colon polyps Neg Hx   . Esophageal  cancer Neg Hx   . Rectal cancer Neg Hx   . Stomach cancer Neg Hx   . Kidney cancer Neg Hx   . Bladder Cancer Neg Hx     SOCIAL HX: former smoker   Current Outpatient Medications:  .  ACCU-CHEK SMARTVIEW test strip, USE AS DIRECTED, Disp: 100 each, Rfl: 3 .  ACCU-CHEK SOFTCLIX LANCETS lancets, Used to check blood glucose once daily, Disp: 100 each, Rfl: 0 .  albuterol (PROAIR HFA) 108 (90 Base) MCG/ACT inhaler, 2 INH INHALATION EVERY FOUR TO SIX HOURS AS NEEDED, Disp: , Rfl:  .  Alcohol Swabs (ALCOHOL PREP) PADS, Use once daily when checking blood sugar, Disp: 100 each, Rfl: 1 .  AMITIZA 24 MCG  capsule, Take 1 capsule (24 mcg total) by mouth 2 (two) times daily., Disp: 60 capsule, Rfl: 5 .  amLODipine (NORVASC) 10 MG tablet, TAKE 1 TABLET BY MOUTH EVERY DAY, Disp: 120 tablet, Rfl: 0 .  atorvastatin (LIPITOR) 40 MG tablet, TAKE 1 TABLET BY MOUTH DAILY, Disp: 30 tablet, Rfl: 9 .  clomiPHENE (CLOMID) 50 MG tablet, Take 1/2 tablet daily, Disp: 30 tablet, Rfl: 3 .  escitalopram (LEXAPRO) 10 MG tablet, TAKE 1 TABLET BY MOUTH EVERY DAY, Disp: 90 tablet, Rfl: 2 .  fluticasone (FLONASE) 50 MCG/ACT nasal spray, Place 2 sprays into both nostrils daily., Disp: 16 g, Rfl: 6 .  gabapentin (NEURONTIN) 300 MG capsule, Take 1 capsule by mouth 2 (two) times daily. Take 1 cap in AM and 2 caps at bedtime, Disp: , Rfl:  .  glucose blood (ACCU-CHEK AVIVA PLUS) test strip, 1 each by Other route daily. Use as instructed, Disp: 100 each, Rfl: 3 .  loratadine (CLARITIN) 10 MG tablet, TAKE 1 TABLET EVERY DAY, Disp: 30 tablet, Rfl: 9 .  losartan (COZAAR) 100 MG tablet, Take 1 tablet (100 mg total) by mouth daily., Disp: 90 tablet, Rfl: 1 .  metFORMIN (GLUCOPHAGE) 500 MG tablet, TAKE 2 TABLETS IN MORNING AND 1 TABLET AT NIGHT, Disp: 180 tablet, Rfl: 0 .  Multiple Vitamin (MULTIVITAMIN) capsule, Take 1 capsule by mouth daily., Disp: , Rfl:  .  Omega-3 Fatty Acids (FISH OIL PO), Take by mouth., Disp: , Rfl:  .  sildenafil (VIAGRA) 50 MG tablet, TAKE 1 TABLET (50 MG TOTAL) BY MOUTH DAILY AS NEEDED FOR ERECTILE DYSFUNCTION., Disp: , Rfl:  .  zolpidem (AMBIEN CR) 6.25 MG CR tablet, TAKE 1 TABLET BY MOUTH AT BEDTIME AS NEEDED SLEEP, Disp: , Rfl:   EXAM:  VITALS per patient if applicable: none  GENERAL: alert, oriented, appears well and in no acute distress  HEENT: atraumatic, conjunttiva clear, no obvious abnormalities on inspection of external nose and ears  NECK: normal movements of the head and neck  LUNGS: on inspection no signs of respiratory distress, breathing rate appears normal, no obvious gross SOB,  gasping or wheezing  CV: no obvious cyanosis  MS: moves all visible extremities without noticeable abnormality  PSYCH/NEURO: pleasant and cooperative, no obvious depression or anxiety, speech and thought processing grossly intact  ASSESSMENT AND PLAN:  Discussed the following assessment and plan:  Elevated LFTs - Plan: US Abdomen Limited RUQ  Essential hypertension - Plan: Basic Metabolic Panel (BMET)  Constipation, unspecified constipation type  Essential hypertension Not at goal.  We will increase losartan to 100 mg daily.  We will check a BMP in 1 week.  Follow-up in 1 month for recheck.  Elevated LFTs Noted to be more elevated than previously.  Discussed  limiting alcohol intake.  Will obtain an ultrasound.  I have placed a call to his neurology office to determine if his IVIG could be contributing though they do not accept calls after 12 PM on Fridays.  We will attempt to call them next week.  Constipation Well treated with amitiza.  His insurance will not pay for this.  We will have our clinical pharmacist check on his insurance plan to see what is preferred.    I discussed the assessment and treatment plan with the patient. The patient was provided an opportunity to ask questions and all were answered. The patient agreed with the plan and demonstrated an understanding of the instructions.   The patient was advised to call back or seek an in-person evaluation if the symptoms worsen or if the condition fails to improve as anticipated.   [name], MD

## 2019-02-08 NOTE — Assessment & Plan Note (Signed)
Well treated with amitiza.  His insurance will not pay for this.  We will have our clinical pharmacist check on his insurance plan to see what is preferred.

## 2019-02-08 NOTE — Telephone Encounter (Signed)
Attempted to call the patients neurologist to ask about whether or not his IVIG could be contributing to his elevated LFTs. They do not accept phone calls after noon on Friday.  Please contact them at [phone] Monday.  If you could get them on the phone I can talk with them.  I would be happy to talk with Dr Manuella Ghazi or Gayland Curry regarding this.   Please additionally contact the patient and get him set up for lab work in 1 week.  Please also get him set up for a virtual visit in 1 month for his hypertension.

## 2019-02-08 NOTE — Progress Notes (Signed)
Call home number [phone]

## 2019-02-08 NOTE — Assessment & Plan Note (Addendum)
Not at goal.  We will increase losartan to 100 mg daily.  We will check a BMP in 1 week.  Follow-up in 1 month for recheck.

## 2019-02-08 NOTE — Assessment & Plan Note (Signed)
Noted to be more elevated than previously.  Discussed limiting alcohol intake.  Will obtain an ultrasound.  I have placed a call to his neurology office to determine if his IVIG could be contributing though they do not accept calls after 12 PM on Fridays.  We will attempt to call them next week.

## 2019-02-09 ENCOUNTER — Telehealth: Payer: Self-pay | Admitting: Family Medicine

## 2019-02-09 NOTE — Telephone Encounter (Signed)
Please let the patient know that Baker Pierini is not covered, though linzess is and we could try that if he is willing.

## 2019-02-09 NOTE — Telephone Encounter (Signed)
-----   Message from De Hollingshead, [facility] sent at 02/08/2019  2:58 PM EDT ----- Marykay Lex.   I was confused because Amitiza is on the [place] Medicaid PDL; called the pharmacy, turns out the patient now has a [employer] Part D plan as well as Medicaid. [facility]; they couldn't give me a covered alternative, but I asked about Linzess specifically and its covered.   Catie ----- Message ----- From: Leone Haven, MD Sent: 02/08/2019   1:46 PM EDT To: De Hollingshead, [facility]  Hey Catie,   This patient has been on amitiza with good benefit for his constipation. He notes his insurance will no longer cover this. Could you look at his insurance and see if there is an alternative that is covered for chronic constipation? Thanks.   Randall Hiss

## 2019-02-12 MED ORDER — LINACLOTIDE 72 MCG PO CAPS: 72.0000 ug | ORAL_CAPSULE | Freq: Every day | ORAL | 1 refills | Status: DC

## 2019-02-12 NOTE — Telephone Encounter (Signed)
Linzess sent to pharmacy. Patient should call us in 1-2 weeks to let us know if this is beneficial.

## 2019-02-12 NOTE — Telephone Encounter (Signed)
Patient states he is willing to try the medication Linzess.  Earsie Humm,cma

## 2019-02-12 NOTE — Telephone Encounter (Signed)
Called pt and scheduled a lab and virtual with provider.  Abria Vannostrand,cma

## 2019-02-12 NOTE — Telephone Encounter (Signed)
Called pt and left a VM to call back. CRM created and sent to [facility] pool. 

## 2019-02-12 NOTE — Telephone Encounter (Signed)
Called [facility].  Dr. Manuella Ghazi and Gayland Curry were not in the office today.  I left a message with the receptionist who will relay it to the physician.  I advised that the patient's liver function tests had come back elevated.  I wanted to see if they felt as though this could be related to the IVIG that the patient has been receiving.  If it is related to that I wanted know what the next step would be.  I advised of the LFT values and that the patient was asymptomatic with this.

## 2019-02-12 NOTE — Addendum Note (Signed)
Addended by: Caryl Bis, ERIC G on: 02/12/2019 01:11 PM   Modules accepted: Orders

## 2019-02-21 ENCOUNTER — Other Ambulatory Visit: Payer: Self-pay

## 2019-02-21 ENCOUNTER — Other Ambulatory Visit (INDEPENDENT_AMBULATORY_CARE_PROVIDER_SITE_OTHER): Payer: Medicare Other

## 2019-02-21 DIAGNOSIS — R7989 Other specified abnormal findings of blood chemistry: Secondary | ICD-10-CM

## 2019-02-21 DIAGNOSIS — R945 Abnormal results of liver function studies: Secondary | ICD-10-CM | POA: Diagnosis not present

## 2019-02-21 DIAGNOSIS — I10 Essential (primary) hypertension: Secondary | ICD-10-CM

## 2019-02-21 LAB — BASIC METABOLIC PANEL
BUN: 17 mg/dL (ref 6–23)
CO2: 29 mEq/L (ref 19–32)
Calcium: 9.5 mg/dL (ref 8.4–10.5)
Chloride: 101 mEq/L (ref 96–112)
Creatinine, Ser: 0.75 mg/dL (ref 0.40–1.50)
GFR: 130.34 mL/min (ref >60.00)
Glucose, Bld: 141 mg/dL — ABNORMAL HIGH (ref 70–99)
Potassium: 4.7 mEq/L (ref 3.5–5.1)
Sodium: 138 mEq/L (ref 135–145)

## 2019-02-21 LAB — HEPATIC FUNCTION PANEL
ALT: 224 U/L — ABNORMAL HIGH (ref 0–53)
AST: 179 U/L — ABNORMAL HIGH (ref 0–37)
Albumin: 4.4 g/dL (ref 3.5–5.2)
Alkaline Phosphatase: 113 U/L (ref 39–117)
Bilirubin, Direct: 0.1 mg/dL (ref 0.0–0.3)
Total Bilirubin: 0.4 mg/dL (ref 0.2–1.2)
Total Protein: 7 g/dL (ref 6.0–8.3)

## 2019-02-26 NOTE — Telephone Encounter (Signed)
Called and spoke with pt. Pt already did lab work per PCP while he was out of the office. No lab work needed. Pt would like referral placed to see GI.   Melissa could you keep [place] up to date if [place] can be done sooner or not?  Thanks   Sent to PCP and Melissa.

## 2019-02-26 NOTE — Telephone Encounter (Signed)
Ultrasound rescheduled for Wednesday May 13-pt is aware. GI referral sent to [facility] as urgent.

## 2019-02-26 NOTE — Addendum Note (Signed)
Addended by: Caryl Bis ERIC G on: 02/26/2019 12:25 PM   Modules accepted: Orders

## 2019-02-26 NOTE — Telephone Encounter (Signed)
Please contact the patient. It appears Dr Manuella Ghazi reviewed his lab work and recommended a GI evaluation. I did not receive a call back on this. I would like to get the patient in to see GI for the elevated LFTs and to recheck the LFTs in our office. If he is willing to do this I can place a referral and order lab work. Thanks.

## 2019-02-26 NOTE — Telephone Encounter (Signed)
Referral placed. I will forward to [facility] to see if she can send the referral as urgent as that is what the neurologist recommended. Also, can we move the [place] up?

## 2019-02-26 NOTE — Telephone Encounter (Signed)
Michael Montgomery, please see my message below.   Melissa, can you move up his [place]? It needs to be done some time in the next week or so. We can change the priority if needed.

## 2019-02-28 ENCOUNTER — Other Ambulatory Visit: Payer: Self-pay | Admitting: Family Medicine

## 2019-03-05 ENCOUNTER — Encounter: Payer: Self-pay | Admitting: Gastroenterology

## 2019-03-05 ENCOUNTER — Ambulatory Visit (INDEPENDENT_AMBULATORY_CARE_PROVIDER_SITE_OTHER): Payer: Medicare Other | Admitting: Gastroenterology

## 2019-03-05 DIAGNOSIS — R748 Abnormal levels of other serum enzymes: Secondary | ICD-10-CM

## 2019-03-05 NOTE — Progress Notes (Signed)
Lucilla Lame, MD [address]  [place]  [place], [place] [address]  Main: [phone]  Fax: [phone]    Gastroenterology Virtual/Video Visit  Referring Provider:     Leone Haven, MD Primary Care Physician:  Leone Haven, MD Primary Gastroenterologist:  Dr.Njeri Vicente Allen Norris Reason for Consultation:     Abnormal liver enzymes        HPI:    Virtual Visit via Video Note Location of the patient: Home Location of provider: Office  Participating persons: The patient myself and Ginger Feldpausch.  I connected with Michael Montgomery on 03/05/19 at  9:30 AM EDT by a video enabled telemedicine application and verified that I am speaking with the correct person using two identifiers.   I discussed the limitations of evaluation and management by telemedicine and the availability of in person appointments. The patient expressed understanding and agreed to proceed.  Verbal consent to proceed obtained.  History of Present Illness: Michael Montgomery is a 58 y.o. male referred by Dr. Caryl Bis, Angela Adam, MD  for consultation & management of abnormal liver enzymes.  This patient comes in after being found to have abnormal liver enzymes that dates back to April 17 of 2019 with an isolated increased AST of 63.  Later that month the patient had his liver enzymes back to normal.  Subsequent to that in July 2019 the patient had an increased alkaline phosphatase at 126 with AST of 47 and ALT of 55.  Repeat labs were done in April 9th of 2020 that showed his liver enzymes to have increased with the alkaline phosphatase remaining normal.  The AST was 179 and the ALT was 217.  1 week later the labs were checked again and showed the alkaline phosphatase to have increased to 120 AST of 166 and ALT of 179.  The patient was then brought back 2 weeks later by his PCP and repeat liver enzyme showed the AST of 179 with the ALT of 224.  At this point it was recommended that the patient see me for his  abnormal liver enzymes. The patient had a negative hepatitis C back in 2019 but it does not appear that the patient has had any other labs for his increased liver enzymes.  The patient was set up for a right upper quadrant ultrasound which is scheduled for tomorrow.  The patient also had a colonoscopy in 2017 with a tubular adenoma and a recommendation for repeat colonoscopy in 5 years. The patient reports that he has been drinking more since he has been home during this pandemic.  He reports that he drinks approximately 1 pint of whiskey 3 times a week.  The patient denies any abdominal pain nausea vomiting fevers or chills.  He also reports that he has not had any dark urine or jaundice.  Past Medical History:  Diagnosis Date   Cataract march, april   bilateral removed    Chickenpox    Diabetes mellitus without complication ([facility])    metformin    Diabetic amyotrophy ([place])    sees dr at [facility]   Gastric ulcer    Heart murmur    When he was younger, no recent issues   High blood pressure    controlled with meds   Hypercholesteremia    controlled with medication   Knee injury    left   Motion sickness    boats    Past Surgical History:  Procedure Laterality Date   CATARACT EXTRACTION  W/PHACO Right 02/01/2016   Procedure: CATARACT EXTRACTION PHACO AND INTRAOCULAR LENS PLACEMENT ([place]) right;  Surgeon: Ronnell Freshwater, MD;  Location: [place];  Service: Ophthalmology;  Laterality: Right;  DIABETIC - oral meds   CATARACT EXTRACTION W/PHACO Left 03/07/2016   Procedure: CATARACT EXTRACTION PHACO AND INTRAOCULAR LENS PLACEMENT ([facility]);  Surgeon: Ronnell Freshwater, MD;  Location: [place];  Service: Ophthalmology;  Laterality: Left;  DIABETIC - oral meds    circumscision      Prior to Admission medications   Medication Sig Start Date End Date Taking? Authorizing Provider  ACCU-CHEK SMARTVIEW test strip USE AS DIRECTED 05/04/18   Leone Haven, MD  ACCU-CHEK SOFTCLIX LANCETS lancets Used to check blood glucose once daily 08/16/17   Leone Haven, MD  albuterol (PROAIR HFA) 108 (90 Base) MCG/ACT inhaler 2 INH INHALATION EVERY FOUR TO SIX HOURS AS NEEDED 09/18/16   [provider]  Alcohol Swabs (ALCOHOL PREP) PADS Use once daily when checking blood sugar 08/16/17   Leone Haven, MD  AMITIZA 24 MCG capsule Take 1 capsule (24 mcg total) by mouth 2 (two) times daily. 02/04/19   Leone Haven, MD  amLODipine (NORVASC) 10 MG tablet TAKE 1 TABLET BY MOUTH EVERY DAY 12/13/18   Leone Haven, MD  atorvastatin (LIPITOR) 40 MG tablet TAKE 1 TABLET BY MOUTH DAILY 08/20/18   Leone Haven, MD  clomiPHENE (CLOMID) 50 MG tablet Take 1/2 tablet daily 04/19/18   Zara Council A, PA-C  escitalopram (LEXAPRO) 10 MG tablet TAKE 1 TABLET BY MOUTH EVERY DAY 10/15/18   Leone Haven, MD  fluticasone (FLONASE) 50 MCG/ACT nasal spray Place 2 sprays into both nostrils daily. 01/31/19   Leone Haven, MD  gabapentin (NEURONTIN) 300 MG capsule Take 1 capsule by mouth 2 (two) times daily. Take 1 cap in AM and 2 caps at bedtime 09/13/16   [provider]  glucose blood (ACCU-CHEK AVIVA PLUS) test strip 1 each by Other route daily. Use as instructed 08/16/17   Leone Haven, MD  linaclotide [facility]) 72 MCG capsule Take 1 capsule (72 mcg total) by mouth daily before breakfast. 02/12/19   Leone Haven, MD  loratadine (CLARITIN) 10 MG tablet TAKE 1 TABLET EVERY DAY 09/03/18   Leone Haven, MD  losartan (COZAAR) 100 MG tablet Take 1 tablet (100 mg total) by mouth daily. 02/08/19   Leone Haven, MD  metFORMIN (GLUCOPHAGE) 500 MG tablet TAKE 2 TABLETS IN MORNING AND 1 TABLET AT NIGHT 02/28/19   Leone Haven, MD  Multiple Vitamin (MULTIVITAMIN) capsule Take 1 capsule by mouth daily.    [provider]  Omega-3 Fatty Acids (FISH OIL PO) Take by mouth.    [provider]    sildenafil (VIAGRA) 50 MG tablet TAKE 1 TABLET (50 MG TOTAL) BY MOUTH DAILY AS NEEDED FOR ERECTILE DYSFUNCTION. 02/12/16   [provider]  zolpidem (AMBIEN CR) 6.25 MG CR tablet TAKE 1 TABLET BY MOUTH AT BEDTIME AS NEEDED SLEEP 06/03/16   [provider]    Family History  Problem Relation Age of Onset   Alcoholism Father    Hyperlipidemia Father    Hypertension Father    Throat cancer Father        smoked but had stopped 62 yeras before dx   Diabetes Mother    Kidney disease Neg Hx    Prostate cancer Neg Hx    Colon cancer Neg Hx  Colon polyps Neg Hx    Esophageal cancer Neg Hx    Rectal cancer Neg Hx    Stomach cancer Neg Hx    Kidney cancer Neg Hx    Bladder Cancer Neg Hx      Social History   Tobacco Use   Smoking status: Former Smoker    Types: Cigars   Smokeless tobacco: Former Systems developer   Tobacco comment: one cigar once a month to once every 2 months   Substance Use Topics   Alcohol use: Yes    Alcohol/week: 0.0 standard drinks    Comment: rare   Drug use: No    Allergies as of 03/05/2019   (No Known Allergies)    Review of Systems:    All systems reviewed and negative except where noted in HPI.   Observations/Objective:  Labs: CBC    Component Value Date/Time   WBC 4.1 05/23/2017 1041   RBC 4.56 05/23/2017 1041   HGB 14.2 02/07/2018 0903   HCT 44.2 02/07/2018 0903   PLT 173.0 05/23/2017 1041   MCV 90.3 05/23/2017 1041   MCH 29.4 04/11/2016 0750   MCHC 33.9 05/23/2017 1041   RDW 13.2 05/23/2017 1041   LYMPHSABS 1.7 05/23/2017 1041   MONOABS 0.4 05/23/2017 1041   EOSABS 0.1 05/23/2017 1041   BASOSABS 0.0 05/23/2017 1041   CMP     Component Value Date/Time   NA 138 02/21/2019 1101   K 4.7 02/21/2019 1101   CL 101 02/21/2019 1101   CO2 29 02/21/2019 1101   GLUCOSE 141 (H) 02/21/2019 1101   BUN 17 02/21/2019 1101   CREATININE 0.75 02/21/2019 1101   CALCIUM 9.5 02/21/2019 1101   PROT 7.0 02/21/2019 1101    PROT 7.3 05/17/2018 0957   ALBUMIN 4.4 02/21/2019 1101   ALBUMIN 4.4 05/17/2018 0957   AST 179 (H) 02/21/2019 1101   ALT 224 (H) 02/21/2019 1101   ALKPHOS 113 02/21/2019 1101   BILITOT 0.4 02/21/2019 1101   BILITOT 0.6 05/17/2018 0957   GFRNONAA >60 04/13/2016 0427   GFRAA >60 04/13/2016 0427    Imaging Studies: No results found.  Assessment and Plan:   Michael Montgomery is a 57 y.o. y/o male has been referred for abnormal liver enzymes.  The patient has had abnormal liver enzymes dating back to April 2019 with his most recent labs showing increasing liver enzymes.  The patient states he has been drinking more heavily since being home during this pandemic.  The patient is due for an ultrasound tomorrow and when he goes for the ultrasound he will also have lab sent off for other possible causes of abnormal liver enzymes.  The patient has been informed that the amount of alcohol intake he is consuming is putting him at risk for liver damage.  The calculations come out to approximately 5 shots of whiskey per day.  The patient will be notified of the results of his ultrasound and lab work when they come back.  Follow Up Instructions:  I discussed the assessment and treatment plan with the patient. The patient was provided an opportunity to ask questions and all were answered. The patient agreed with the plan and demonstrated an understanding of the instructions.   The patient was advised to call back or seek an in-person evaluation if the symptoms worsen or if the condition fails to improve as anticipated.  I provided 15 minutes of non-face-to-face time during this encounter.   Lucilla Lame, MD  Speech recognition software was  used to dictate the above note.

## 2019-03-06 ENCOUNTER — Other Ambulatory Visit
Admission: RE | Admit: 2019-03-06 | Discharge: 2019-03-06 | Disposition: A | Discharge status: Home / Self Care | Payer: Medicare Other | Source: Ambulatory Visit | Attending: Gastroenterology | Admitting: Gastroenterology

## 2019-03-06 ENCOUNTER — Ambulatory Visit
Admission: RE | Admit: 2019-03-06 | Discharge: 2019-03-06 | Disposition: A | Discharge status: Home / Self Care | Payer: Medicare Other | Source: Ambulatory Visit | Attending: Family Medicine | Admitting: Family Medicine

## 2019-03-06 ENCOUNTER — Other Ambulatory Visit: Payer: Self-pay

## 2019-03-06 DIAGNOSIS — R7989 Other specified abnormal findings of blood chemistry: Secondary | ICD-10-CM | POA: Diagnosis not present

## 2019-03-06 DIAGNOSIS — R945 Abnormal results of liver function studies: Secondary | ICD-10-CM | POA: Diagnosis not present

## 2019-03-06 DIAGNOSIS — R748 Abnormal levels of other serum enzymes: Secondary | ICD-10-CM | POA: Diagnosis not present

## 2019-03-06 LAB — IRON AND TIBC
Iron: 79 ug/dL (ref 45–182)
Saturation Ratios: 24 % (ref 17.9–39.5)
TIBC: 335 ug/dL (ref 250–450)
UIBC: 256 ug/dL

## 2019-03-06 LAB — FERRITIN: Ferritin: 150 ng/mL (ref 24–336)

## 2019-03-06 LAB — HEPATIC FUNCTION PANEL
ALT: 248 U/L — ABNORMAL HIGH (ref 0–44)
AST: 179 U/L — ABNORMAL HIGH (ref 15–41)
Albumin: 4.3 g/dL (ref 3.5–5.0)
Alkaline Phosphatase: 118 U/L (ref 38–126)
Bilirubin, Direct: <0.1 mg/dL (ref 0.0–0.2)
Total Bilirubin: 0.7 mg/dL (ref 0.3–1.2)
Total Protein: 7.7 g/dL (ref 6.5–8.1)

## 2019-03-07 LAB — CERULOPLASMIN: Ceruloplasmin: 27.4 mg/dL (ref 16.0–31.0)

## 2019-03-07 LAB — ANTI-SMOOTH MUSCLE ANTIBODY, IGG: F-Actin IgG: 3 Units (ref 0–19)

## 2019-03-07 LAB — ANA: Anti Nuclear Antibody (ANA): NEGATIVE

## 2019-03-07 LAB — HEPATITIS A ANTIBODY, TOTAL: hep A Total Ab: POSITIVE — AB

## 2019-03-07 LAB — HEPATITIS B SURFACE ANTIGEN: Hepatitis B Surface Ag: NEGATIVE

## 2019-03-07 LAB — ALPHA-1-ANTITRYPSIN: A-1 Antitrypsin, Ser: 154 mg/dL (ref 101–187)

## 2019-03-07 LAB — MITOCHONDRIAL ANTIBODIES: Mitochondrial M2 Ab, IgG: <20 Units (ref 0.0–20.0)

## 2019-03-07 LAB — HEPATITIS B SURFACE ANTIBODY,QUALITATIVE: Hep B S Ab: REACTIVE

## 2019-03-11 ENCOUNTER — Other Ambulatory Visit: Payer: Self-pay | Admitting: Family Medicine

## 2019-03-25 ENCOUNTER — Other Ambulatory Visit: Payer: Self-pay

## 2019-03-25 ENCOUNTER — Ambulatory Visit (INDEPENDENT_AMBULATORY_CARE_PROVIDER_SITE_OTHER): Payer: Medicare Other | Admitting: Family Medicine

## 2019-03-25 ENCOUNTER — Encounter: Payer: Self-pay | Admitting: Family Medicine

## 2019-03-25 ENCOUNTER — Other Ambulatory Visit: Payer: Self-pay | Admitting: Family Medicine

## 2019-03-25 DIAGNOSIS — R7989 Other specified abnormal findings of blood chemistry: Secondary | ICD-10-CM

## 2019-03-25 DIAGNOSIS — R945 Abnormal results of liver function studies: Secondary | ICD-10-CM

## 2019-03-25 DIAGNOSIS — I10 Essential (primary) hypertension: Secondary | ICD-10-CM

## 2019-03-25 MED ORDER — HYDROCHLOROTHIAZIDE 12.5 MG PO TABS: 12.5000 mg | ORAL_TABLET | Freq: Every day | ORAL | 1 refills | Status: DC

## 2019-03-25 NOTE — Assessment & Plan Note (Signed)
Above goal.  He will continue his current medication and we will add HCTZ.  He will come back to the office for BP check with nurse in 1 month and lab work in 1 month.  Discussed potential for kidney injury as well as hypokalemia and hyponatremia with HCTZ.  Monitor for symptoms.

## 2019-03-25 NOTE — Assessment & Plan Note (Signed)
Likely related to alcohol intake.  Discussed remaining off of alcohol and rechecking his liver function tests in 1 month.  If they remain elevated he will need to see GI for consideration of a liver biopsy.  We will also confirm with GI whether or not the patient needs a hepatitis B vaccine as it appears he is immune based on testing.

## 2019-03-25 NOTE — Progress Notes (Signed)
Virtual Visit via video Note  This visit type was conducted due to national recommendations for restrictions regarding the COVID-19 pandemic (e.g. social distancing).  This format is felt to be most appropriate for this patient at this time.  All issues noted in this document were discussed and addressed.  No physical exam was performed (except for noted visual exam findings with Video Visits).   I connected with Michael Montgomery today at  3:15 PM EDT by a video enabled telemedicine application and verified that I am speaking with the correct person using two identifiers. Location patient: home Location provider: work Persons participating in the virtual visit: patient, provider  I discussed the limitations, risks, security and privacy concerns of performing an evaluation and management service by telephone and the availability of in person appointments. I also discussed with the patient that there may be a patient responsible charge related to this service. The patient expressed understanding and agreed to proceed.  Reason for visit: follow-up  HPI: Hypertension: Has been running greater than 140 over 48s.  Has been less than 160 over 67s.  He is taking losartan and amlodipine.  He has not been able to exercise with the COVID-19 pandemic and thinks this may be contributing.  No chest pain, shortness of breath, or edema.  He plans to start walking at the park and riding his bike.  He does note a strong family history of hypertension with all of his brothers having hypertension.  Elevated LFTs: He notes no abdominal pain.  He did see GI and had lab work revealed continued elevated LFTs.  No other significant abnormalities were noted.  He did note that he was drinking alcohol previously though he has discontinued this after GI advised him to quit.   ROS: See pertinent positives and negatives per HPI.  Past Medical History:  Diagnosis Date  . Cataract march, april   bilateral removed   .  Chickenpox   . Diabetes mellitus without complication ([facility])    metformin   . Diabetic amyotrophy [facility])    sees dr at [facility]  . Gastric ulcer   . Heart murmur    When he was younger, no recent issues  . High blood pressure    controlled with meds  . Hypercholesteremia    controlled with medication  . Knee injury    left  . Motion sickness    boats    Past Surgical History:  Procedure Laterality Date  . CATARACT EXTRACTION W/PHACO Right 02/01/2016   Procedure: CATARACT EXTRACTION PHACO AND INTRAOCULAR LENS PLACEMENT ([place]) right;  Surgeon: Ronnell Freshwater, MD;  Location: [place];  Service: Ophthalmology;  Laterality: Right;  DIABETIC - oral meds  . CATARACT EXTRACTION W/PHACO Left 03/07/2016   Procedure: CATARACT EXTRACTION PHACO AND INTRAOCULAR LENS PLACEMENT ([facility]);  Surgeon: Ronnell Freshwater, MD;  Location: [place];  Service: Ophthalmology;  Laterality: Left;  DIABETIC - oral meds   . circumscision      Family History  Problem Relation Age of Onset  . Alcoholism Father   . Hyperlipidemia Father   . Hypertension Father   . Throat cancer Father        smoked but had stopped 20 yeras before dx  . Diabetes Mother   . Kidney disease Neg Hx   . Prostate cancer Neg Hx   . Colon cancer Neg Hx   . Colon polyps Neg Hx   . Esophageal cancer Neg Hx   . Rectal cancer Neg  Hx   . Stomach cancer Neg Hx   . Kidney cancer Neg Hx   . Bladder Cancer Neg Hx     SOCIAL HX: Former smoker   Current Outpatient Medications:  .  ACCU-CHEK SMARTVIEW test strip, USE AS DIRECTED, Disp: 100 each, Rfl: 3 .  ACCU-CHEK SOFTCLIX LANCETS lancets, Used to check blood glucose once daily, Disp: 100 each, Rfl: 0 .  albuterol (PROAIR HFA) 108 (90 Base) MCG/ACT inhaler, 2 INH INHALATION EVERY FOUR TO SIX HOURS AS NEEDED, Disp: , Rfl:  .  Alcohol Swabs (ALCOHOL PREP) PADS, Use once daily when checking blood sugar, Disp: 100 each, Rfl: 1 .  AMITIZA 24 MCG capsule, Take  1 capsule (24 mcg total) by mouth 2 (two) times daily., Disp: 60 capsule, Rfl: 5 .  amLODipine (NORVASC) 10 MG tablet, TAKE 1 TABLET BY MOUTH EVERY DAY, Disp: 90 tablet, Rfl: 1 .  atorvastatin (LIPITOR) 40 MG tablet, TAKE 1 TABLET BY MOUTH DAILY, Disp: 30 tablet, Rfl: 9 .  clomiPHENE (CLOMID) 50 MG tablet, Take 1/2 tablet daily, Disp: 30 tablet, Rfl: 3 .  escitalopram (LEXAPRO) 10 MG tablet, TAKE 1 TABLET BY MOUTH EVERY DAY, Disp: 90 tablet, Rfl: 2 .  fluticasone (FLONASE) 50 MCG/ACT nasal spray, Place 2 sprays into both nostrils daily., Disp: 16 g, Rfl: 6 .  gabapentin (NEURONTIN) 300 MG capsule, Take 1 capsule by mouth 2 (two) times daily. Take 1 cap in AM and 2 caps at bedtime, Disp: , Rfl:  .  GAMUNEX-C 20 GM/200ML SOLN, , Disp: , Rfl:  .  glucose blood (ACCU-CHEK AVIVA PLUS) test strip, 1 each by Other route daily. Use as instructed, Disp: 100 each, Rfl: 3 .  linaclotide (LINZESS) 72 MCG capsule, Take 1 capsule (72 mcg total) by mouth daily before breakfast., Disp: 30 capsule, Rfl: 1 .  loratadine (CLARITIN) 10 MG tablet, TAKE 1 TABLET EVERY DAY, Disp: 30 tablet, Rfl: 9 .  losartan (COZAAR) 100 MG tablet, Take 1 tablet (100 mg total) by mouth daily., Disp: 90 tablet, Rfl: 1 .  metFORMIN (GLUCOPHAGE) 500 MG tablet, TAKE 2 TABLETS IN MORNING AND 1 TABLET AT NIGHT, Disp: 180 tablet, Rfl: 0 .  Multiple Vitamin (MULTIVITAMIN) capsule, Take 1 capsule by mouth daily., Disp: , Rfl:  .  Omega-3 Fatty Acids (FISH OIL PO), Take by mouth., Disp: , Rfl:  .  sildenafil (VIAGRA) 50 MG tablet, TAKE 1 TABLET (50 MG TOTAL) BY MOUTH DAILY AS NEEDED FOR ERECTILE DYSFUNCTION., Disp: , Rfl:  .  zolpidem (AMBIEN CR) 6.25 MG CR tablet, TAKE 1 TABLET BY MOUTH AT BEDTIME AS NEEDED SLEEP, Disp: , Rfl:  .  hydrochlorothiazide (HYDRODIURIL) 12.5 MG tablet, Take 1 tablet (12.5 mg total) by mouth daily., Disp: 90 tablet, Rfl: 1  EXAM:  VITALS per patient if applicable: None.  GENERAL: alert, oriented, appears well and  in no acute distress  HEENT: atraumatic, conjunttiva clear, no obvious abnormalities on inspection of external nose and ears  NECK: normal movements of the head and neck  LUNGS: on inspection no signs of respiratory distress, breathing rate appears normal, no obvious gross SOB, gasping or wheezing  CV: no obvious cyanosis  MS: moves all visible extremities without noticeable abnormality  PSYCH/NEURO: pleasant and cooperative, no obvious depression or anxiety, speech and thought processing grossly intact  ASSESSMENT AND PLAN:  Discussed the following assessment and plan:  Essential hypertension - Plan: Comp Met (CMET), Urine Microalbumin w/creat. ratio  Elevated LFTs  Essential hypertension Above goal.  He will continue his current medication and we will add HCTZ.  He will come back to the office for BP check with nurse in 1 month and lab work in 1 month.  Discussed potential for kidney injury as well as hypokalemia and hyponatremia with HCTZ.  Monitor for symptoms.  Elevated LFTs Likely related to alcohol intake.  Discussed remaining off of alcohol and rechecking his liver function tests in 1 month.  If they remain elevated he will need to see GI for consideration of a liver biopsy.  We will also confirm with GI whether or not the patient needs a hepatitis B vaccine as it appears he is immune based on testing.  Phone office staff will contact the patient to schedule lab work and a nurse BP check for 1 month.  Follow-up with me in 3 months.   I discussed the assessment and treatment plan with the patient. The patient was provided an opportunity to ask questions and all were answered. The patient agreed with the plan and demonstrated an understanding of the instructions.   The patient was advised to call back or seek an in-person evaluation if the symptoms worsen or if the condition fails to improve as anticipated.   [name], MD

## 2019-03-26 ENCOUNTER — Telehealth: Payer: Self-pay | Admitting: Family Medicine

## 2019-03-26 NOTE — Telephone Encounter (Signed)
Called Pt and told him that he does not need the Hep B vaccine, Pt stated okay

## 2019-03-26 NOTE — Telephone Encounter (Signed)
Please let the patient know that I heard back from GI and they noted that he did not need the hepatitis B vaccine.

## 2019-03-26 NOTE — Telephone Encounter (Signed)
-----   Message from Lucilla Lame, MD sent at 03/25/2019  5:12 PM EDT ----- Hi, You are correct. I am so sorry. I must have seen the control of non-reactive and read it incorrectly. He does not need any vaccinations. Thank you for rechecking his LFT's. They can take some time for the LFT's to come down after stopping etoh due to the fatty liver load but it would be nice to see it heading in the right direction or even normalizing. Thanks again. ----- Message ----- From: Leone Haven, MD Sent: 03/25/2019   5:01 PM EDT To: Lucilla Lame, MD  Hi Dr Allen Norris,   I saw Mr Tamez for follow-up today. He noted he has stopped drinking alcohol. It looks like you recommended he get a hepatitis B vaccine. I wanted to confirm the need for this as it looks like his Hep B surface antibody was reactive indicating immunity. I am also planning on checking his LFTs again in a month and can get him back to you for a liver biopsy if they remain elevated and he is off alcohol until then. Thanks for your help.  Randall Hiss

## 2019-03-27 ENCOUNTER — Other Ambulatory Visit: Payer: Self-pay

## 2019-03-27 MED ORDER — METFORMIN HCL 500 MG PO TABS: ORAL_TABLET | ORAL | 5 refills | Status: DC

## 2019-03-27 NOTE — Telephone Encounter (Signed)
Refill sent to pharmacy with 5 refills, per protocol.  [name],cma

## 2019-03-28 ENCOUNTER — Ambulatory Visit: Payer: Medicare Other

## 2019-03-29 ENCOUNTER — Ambulatory Visit (INDEPENDENT_AMBULATORY_CARE_PROVIDER_SITE_OTHER): Payer: Medicare Other | Admitting: Podiatry

## 2019-03-29 ENCOUNTER — Encounter: Payer: Self-pay | Admitting: Podiatry

## 2019-03-29 ENCOUNTER — Other Ambulatory Visit: Payer: Self-pay

## 2019-03-29 VITALS — Temp 97.3°F

## 2019-03-29 DIAGNOSIS — B351 Tinea unguium: Secondary | ICD-10-CM | POA: Diagnosis not present

## 2019-03-29 DIAGNOSIS — M79676 Pain in unspecified toe(s): Secondary | ICD-10-CM

## 2019-03-29 DIAGNOSIS — E119 Type 2 diabetes mellitus without complications: Secondary | ICD-10-CM

## 2019-03-29 DIAGNOSIS — M79609 Pain in unspecified limb: Secondary | ICD-10-CM

## 2019-03-29 NOTE — Progress Notes (Signed)
Complaint:  Visit Type: Patient returns to my office for continued preventative foot care services. Complaint: Patient states" my nails have grown long and thick and become painful to walk and wear shoes" Patient has been diagnosed with DM with no foot complications. The patient presents for preventative foot care services. No changes to ROS  Podiatric Exam: Vascular: dorsalis pedis and posterior tibial pulses are palpable bilateral. Capillary return is immediate. Temperature gradient is WNL. Skin turgor WNL  Sensorium: Normal Semmes Weinstein monofilament test. Normal tactile sensation bilaterally. Nail Exam: Pt has thick disfigured discolored nails with subungual debris noted bilateral entire nail hallux through fifth toenails Ulcer Exam: There is no evidence of ulcer or pre-ulcerative changes or infection. Orthopedic Exam: Muscle tone and strength are WNL. No limitations in general ROM. No crepitus or effusions noted. Hallux limitus  Right.  Hammer toes 2-4  B/L Skin: No Porokeratosis. No infection or ulcers  Diagnosis:  Onychomycosis, , Pain in right toe, pain in left toes  Treatment & Plan Procedures and Treatment: Consent by patient was obtained for treatment procedures. The patient understood the discussion of treatment and procedures well. All questions were answered thoroughly reviewed. Debridement of mycotic and hypertrophic toenails, 1 through 5 bilateral and clearing of subungual debris. No ulceration, no infection noted.  Return Visit-Office Procedure: Patient instructed to return to the office for a follow up visit 4 months for continued evaluation and treatment.    Gardiner Barefoot DPM

## 2019-04-04 ENCOUNTER — Telehealth: Payer: Self-pay

## 2019-04-04 NOTE — Telephone Encounter (Signed)
-----   Message from Lucilla Lame, MD sent at 03/12/2019 10:27 AM EDT ----- Let the patient know that he is immune to hepatitis a but needs a vaccination for hepatitis B.  His liver enzymes are still elevated and if he is continuing to drink he should stop for at least the next 1 to 2 months and then we can recheck his liver enzymes to see if they come back down.  Otherwise if he continues to have abnormal liver enzymes after stopping his alcohol intake he may need a liver biopsy for definitive diagnosis.

## 2019-04-04 NOTE — Telephone Encounter (Signed)
Pt notified of lab results. Pt stated he had a drink yesterday but would stop so we can recheck his liver enzymes. Will contact pt in August.

## 2019-04-17 DIAGNOSIS — G6181 Chronic inflammatory demyelinating polyneuritis: Secondary | ICD-10-CM | POA: Diagnosis not present

## 2019-04-19 ENCOUNTER — Encounter: Payer: Self-pay | Admitting: Oncology

## 2019-04-19 ENCOUNTER — Other Ambulatory Visit: Payer: Self-pay

## 2019-04-19 ENCOUNTER — Other Ambulatory Visit: Payer: Self-pay | Admitting: Family Medicine

## 2019-04-19 ENCOUNTER — Inpatient Hospital Stay: Payer: Medicare Other | Admitting: Oncology

## 2019-04-19 NOTE — Progress Notes (Signed)
Called patient today for Telehealth visit via [place].

## 2019-04-25 NOTE — Progress Notes (Signed)
Patient was not seen. Encounter was cancelled.

## 2019-05-05 ENCOUNTER — Other Ambulatory Visit: Payer: Self-pay | Admitting: Family Medicine

## 2019-05-31 DIAGNOSIS — E113293 Type 2 diabetes mellitus with mild nonproliferative diabetic retinopathy without macular edema, bilateral: Secondary | ICD-10-CM | POA: Diagnosis not present

## 2019-05-31 LAB — HM DIABETES EYE EXAM

## 2019-06-03 ENCOUNTER — Other Ambulatory Visit: Payer: Self-pay | Admitting: Family Medicine

## 2019-06-09 DIAGNOSIS — Z23 Encounter for immunization: Secondary | ICD-10-CM | POA: Diagnosis not present

## 2019-06-18 ENCOUNTER — Other Ambulatory Visit: Payer: Self-pay | Admitting: Family Medicine

## 2019-07-03 ENCOUNTER — Other Ambulatory Visit: Payer: Self-pay

## 2019-07-03 ENCOUNTER — Telehealth: Payer: Self-pay

## 2019-07-03 DIAGNOSIS — R748 Abnormal levels of other serum enzymes: Secondary | ICD-10-CM

## 2019-07-03 NOTE — Telephone Encounter (Signed)
Contacted pt to repeat LFT's. Pt will go to [facility] as soon as he can to repeat.

## 2019-07-03 NOTE — Telephone Encounter (Signed)
-----   Message from [place], [place] sent at 04/04/2019  9:57 AM EDT ----- Pt needs repeat LFT's.

## 2019-07-04 ENCOUNTER — Other Ambulatory Visit: Payer: Self-pay | Admitting: Family Medicine

## 2019-07-08 ENCOUNTER — Other Ambulatory Visit
Admission: RE | Admit: 2019-07-08 | Discharge: 2019-07-08 | Disposition: A | Discharge status: Home / Self Care | Payer: Medicare Other | Source: Ambulatory Visit | Attending: Gastroenterology | Admitting: Gastroenterology

## 2019-07-08 ENCOUNTER — Other Ambulatory Visit: Payer: Self-pay

## 2019-07-08 DIAGNOSIS — R748 Abnormal levels of other serum enzymes: Secondary | ICD-10-CM | POA: Diagnosis not present

## 2019-07-08 LAB — HEPATIC FUNCTION PANEL
ALT: 324 U/L — ABNORMAL HIGH (ref 0–44)
AST: 299 U/L — ABNORMAL HIGH (ref 15–41)
Albumin: 4 g/dL (ref 3.5–5.0)
Alkaline Phosphatase: 86 U/L (ref 38–126)
Bilirubin, Direct: <0.1 mg/dL (ref 0.0–0.2)
Total Bilirubin: 0.7 mg/dL (ref 0.3–1.2)
Total Protein: 7.4 g/dL (ref 6.5–8.1)

## 2019-07-09 ENCOUNTER — Telehealth: Payer: Self-pay | Admitting: Family Medicine

## 2019-07-09 NOTE — Telephone Encounter (Signed)
Called and scheduled a appointment with the patient for this Friday with the provider. Both legs are swollen.    [place],ma

## 2019-07-09 NOTE — Telephone Encounter (Signed)
Thanks

## 2019-07-09 NOTE — Telephone Encounter (Signed)
Patient should be seen for this. Please call to get him scheduled and see if he has ben having any other symptoms with this. Please make sure that it is both of his legs that are swelling and not just one of them. Thanks.

## 2019-07-09 NOTE — Telephone Encounter (Signed)
Pt states his legs have been swelling in the past month. No new medication changes. No exercise changes. Pt may need to be seen but I told him I would ask if you had any recomendations first.  Thanks    Last OV 03/25/19 AWV scheduled 07/11/19

## 2019-07-10 ENCOUNTER — Other Ambulatory Visit: Payer: Self-pay

## 2019-07-11 ENCOUNTER — Telehealth: Payer: Self-pay

## 2019-07-11 ENCOUNTER — Ambulatory Visit (INDEPENDENT_AMBULATORY_CARE_PROVIDER_SITE_OTHER): Payer: Medicare Other

## 2019-07-11 DIAGNOSIS — Z0000 Encounter for general adult medical examination without abnormal findings: Secondary | ICD-10-CM

## 2019-07-11 NOTE — Patient Instructions (Addendum)
  Michael Montgomery , Thank you for taking time to come for your Medicare Wellness Visit. I appreciate your ongoing commitment to your health goals. Please review the following plan we discussed and let me know if I can assist you in the future.   These are the goals we discussed: Goals      Patient Stated   . Prevent falls (pt-stated)       This is a list of the screening recommended for you and due dates:  Health Maintenance  Topic Date Due  . Hemoglobin A1C  08/02/2019  . Complete foot exam   03/28/2020  . Eye exam for diabetics  05/30/2020  . Colon Cancer Screening  03/31/2021  . Tetanus Vaccine  06/08/2029  . Flu Shot  Completed  . Pneumococcal vaccine  Completed  .  Hepatitis C: One time screening is recommended by Center for Disease Control  (CDC) for  adults born from [birth year] through 1963.   Completed  . HIV Screening  Completed

## 2019-07-11 NOTE — Telephone Encounter (Signed)
-----   Message from Lucilla Lame, MD sent at 07/09/2019  1:27 PM EDT ----- Let the patient know his liver tests are worse then before and he should come in to the office to plan our next steps and to discuss what may be causing this.

## 2019-07-11 NOTE — Progress Notes (Signed)
Subjective:   Michael Montgomery is a 58 y.o. male who presents for an Initial Medicare Annual Wellness Visit.  Review of Systems  No ROS.  Medicare Wellness Virtual Visit.  Visual/audio telehealth visit, UTA vital signs.   See social history for additional risk factors.   Cardiac Risk Factors include: advanced age (>38men, >43 women);male gender;hypertension;diabetes mellitus    Objective:    Today's Vitals   There is no height or weight on file to calculate BMI.  Advanced Directives 07/11/2019 04/19/2019 10/04/2016 10/04/2016 04/20/2016 03/17/2016 02/01/2016  Does Patient Have a Medical Advance Directive? No No No;Yes No No No No  Type of Advance Directive - Public librarian;Living will - - - -  Would patient like information on creating a medical advance directive? Yes (MAU/Ambulatory/Procedural Areas - Information given) - No - Patient declined - No - patient declined information - No - patient declined information  Some encounter information is confidential and restricted. Go to Review Flowsheets activity to see all data.    Current Medications (verified) Outpatient Encounter Medications as of 07/11/2019  Medication Sig  . ACCU-CHEK SMARTVIEW test strip USE AS DIRECTED  . ACCU-CHEK SOFTCLIX LANCETS lancets Used to check blood glucose once daily  . albuterol (PROAIR HFA) 108 (90 Base) MCG/ACT inhaler 2 INH INHALATION EVERY FOUR TO SIX HOURS AS NEEDED  . Alcohol Swabs (ALCOHOL PREP) PADS Use once daily when checking blood sugar  . amLODipine (NORVASC) 10 MG tablet TAKE 1 TABLET BY MOUTH EVERY DAY  . atorvastatin (LIPITOR) 40 MG tablet TAKE 1 TABLET BY MOUTH DAILY  . clomiPHENE (CLOMID) 50 MG tablet Take 1/2 tablet daily  . escitalopram (LEXAPRO) 10 MG tablet TAKE 1 TABLET BY MOUTH EVERY DAY  . fluticasone (FLONASE) 50 MCG/ACT nasal spray Place 2 sprays into both nostrils daily.  Marland Kitchen gabapentin (NEURONTIN) 300 MG capsule Take 1 capsule by mouth 2 (two) times daily.  Take 1 cap in AM and 2 caps at bedtime  . GAMUNEX-C 20 GM/200ML SOLN   . glucose blood (ACCU-CHEK AVIVA PLUS) test strip 1 each by Other route daily. Use as instructed  . hydrochlorothiazide (HYDRODIURIL) 12.5 MG tablet Take 1 tablet (12.5 mg total) by mouth daily.  Marland Kitchen linaclotide (LINZESS) 72 MCG capsule Take 1 capsule (72 mcg total) by mouth daily before breakfast.  . loratadine (CLARITIN) 10 MG tablet TAKE 1 TABLET EVERY DAY  . losartan (COZAAR) 100 MG tablet Take 1 tablet (100 mg total) by mouth daily.  . metFORMIN (GLUCOPHAGE) 500 MG tablet TAKE 2 TABLETS IN MORNING AND 1 TABLET AT NIGHT  . Multiple Vitamin (MULTIVITAMIN) capsule Take 1 capsule by mouth daily.  . Omega-3 Fatty Acids (FISH OIL PO) Take by mouth.  . sildenafil (VIAGRA) 50 MG tablet TAKE 1 TABLET (50 MG TOTAL) BY MOUTH DAILY AS NEEDED FOR ERECTILE DYSFUNCTION.   No facility-administered encounter medications on file as of 07/11/2019.     Allergies (verified) Patient has no known allergies.   History: Past Medical History:  Diagnosis Date  . Cataract march, april   bilateral removed   . Chickenpox   . Diabetes mellitus without complication (HCC)    metformin   . 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 Gastric ulcer   . Heart murmur    When he was younger, no recent issues  . High blood pressure    controlled with meds  . Hypercholesteremia    controlled with medication  . Knee  injury    left  . Motion sickness    boats   Past Surgical History:  Procedure Laterality Date  . CATARACT EXTRACTION W/PHACO Right 02/01/2016   Procedure: CATARACT EXTRACTION PHACO AND INTRAOCULAR LENS PLACEMENT ([place]) right;  Surgeon: Ronnell Freshwater, MD;  Location: [place];  Service: Ophthalmology;  Laterality: Right;  DIABETIC - oral meds  . CATARACT EXTRACTION W/PHACO Left 03/07/2016   Procedure: CATARACT EXTRACTION PHACO AND INTRAOCULAR LENS PLACEMENT ([facility]);  Surgeon: Ronnell Freshwater, MD;   Location: [place];  Service: Ophthalmology;  Laterality: Left;  DIABETIC - oral meds   . circumscision     Family History  Problem Relation Age of Onset  . Alcoholism Father   . Hyperlipidemia Father   . Hypertension Father   . Throat cancer Father        smoked but had stopped 20 yeras before dx  . Diabetes Mother   . Emphysema Mother   . Kidney disease Neg Hx   . Prostate cancer Neg Hx   . Colon cancer Neg Hx   . Colon polyps Neg Hx   . Esophageal cancer Neg Hx   . Rectal cancer Neg Hx   . Stomach cancer Neg Hx   . Kidney cancer Neg Hx   . Bladder Cancer Neg Hx    Social History   Socioeconomic History  . Marital status: Married    Spouse name: Not on file  . Number of children: Not on file  . Years of education: Not on file  . Highest education level: Not on file  Occupational History  . Not on file  Social Needs  . Financial resource strain: Not hard at all  . Food insecurity    Worry: Never true    Inability: Never true  . Transportation needs    Medical: No    Non-medical: No  Tobacco Use  . Smoking status: Former Smoker    Types: Cigars  . Smokeless tobacco: Former Systems developer  . Tobacco comment: one cigar once a month to once every 2 months   Substance and Sexual Activity  . Alcohol use: Not Currently    Alcohol/week: 5.0 standard drinks    Types: 5 Shots of liquor per week  . Drug use: No  . Sexual activity: Not on file  Lifestyle  . Physical activity    Days per week: 1 day    Minutes per session: 60 min  . Stress: Not at all  Relationships  . Social Herbalist on phone: Not on file    Gets together: Not on file    Attends religious service: Not on file    Active member of club or organization: Not on file    Attends meetings of clubs or organizations: Not on file    Relationship status: Not on file  Other Topics Concern  . Not on file  Social History Narrative  . Not on file   Tobacco Counseling Counseling given: Not  Answered Comment: one cigar once a month to once every 2 months    Clinical Intake:  Pre-visit preparation completed: Yes        Diabetes: Yes(Followed by pcp)  How often do you need to have someone help you when you read instructions, pamphlets, or other written materials from your doctor or pharmacy?: 1 - Never  Interpreter Needed?: No     Activities of Daily Living In your present state of health, do you have any difficulty  performing the following activities: 07/11/2019  Hearing? N  Vision? N  Difficulty concentrating or making decisions? N  Walking or climbing stairs? N  Dressing or bathing? N  Doing errands, shopping? N  Preparing Food and eating ? N  Using the Toilet? N  In the past six months, have you accidently leaked urine? N  Do you have problems with loss of bowel control? N  Managing your Medications? N  Managing your Finances? N  Housekeeping or managing your Housekeeping? N  Some recent data might be hidden     Immunizations and Health Maintenance Immunization History  Administered Date(s) Administered  . Influenza,inj,Quad PF,6+ Mos 06/17/2017, 06/21/2018  . Influenza-Unspecified 08/24/2016, 06/21/2018, 07/12/2018  . Pneumococcal Polysaccharide-23 12/08/2016  . Tdap 12/08/2016   There are no preventive care reminders to display for this patient.  Patient Care Team: Leone Haven, MD as PCP - General (Family Medicine)  Indicate any recent Medical Services you may have received from other than [facility] providers in the past year (date may be approximate).    Assessment:   This is a routine wellness examination for [place].  I connected with patient 07/11/19 at 11:00 AM EDT by an audio enabled telemedicine application and verified that I am speaking with the correct person using two identifiers. Patient stated full name and DOB. Patient gave permission to continue with virtual visit. Patient's location was at home and Nurse's location was at [place]  office.   Health Maintenance Due: See completed HM at the end of note.   Eye: Visual acuity not assessed. Virtual visit. Wears corrective lenses. Followed by their ophthalmologist every 12 months.  Retinopathy- none reported  Dental: Visits every 12 months.    Hearing: Demonstrates normal hearing during visit.  Safety:  Patient feels safe at home- yes Patient does have smoke detectors at home- yes Patient does wear sunscreen or protective clothing when in direct sunlight - yes Patient does wear seat belt when in a moving vehicle - yes Patient drives- yes Adequate lighting in walkways free from debris- yes Grab bars and handrails used as appropriate- yes Ambulates with yes assistive device Cell phone on person when ambulating outside of the home- yes  Social: Alcohol intake - no      Smoking history- former    Smokers in home? none Illicit drug use? none  Depression: PHQ 2 &9 complete. See screening below. Denies irritability, anhedonia, sadness/tearfullness.  Stable.   Falls: See screening below.    Medication: Taking as directed and without issues.   Covid-19: Precautions and sickness symptoms discussed. Wears mask, social distancing, hand hygiene as appropriate.   Activities of Daily Living Patient denies needing assistance with: household chores, feeding themselves, getting from bed to chair, getting to the toilet, bathing/showering, dressing, managing money, or preparing meals.  Assisted by wife with home management as needed.   Memory: Patient is alert. Patient denies difficulty focusing or concentrating. Correctly identified the president of the [place], season and recall. Patient attends a Insurance underwriter for brain stimulation.  BMI- discussed the importance of a healthy diet, water intake and the benefits of aerobic exercise.  Educational material provided.  Physical activity- online exercise 1 hour per week. Walking as tolerated.   Diet:  Diabetic diet  Water: good intake Caffeine: 1 cup of coffee  Advanced Directive: End of life planning; Advance aging; Advanced directives discussed.  Copy of current HCPOA/Living Will requested.    Other Providers Patient Care Team: Leone Haven, MD as PCP -  General (Family Medicine)  Hearing/Vision screen  Hearing Screening   125Hz  250Hz  500Hz  1000Hz  2000Hz  3000Hz  4000Hz  6000Hz  8000Hz   Right ear:           Left ear:           Comments: Patient is able to hear conversational tones without difficulty.  No issues reported.  Vision Screening Comments: Wears corrective lenses Cataract extraction, bilateral Visual acuity not assessed, virtual visit.  They have seen their ophthalmologist in the last 12 months.     Dietary issues and exercise activities discussed: Current Exercise Habits: Structured exercise class, Time (Minutes): 60, Frequency (Times/Week): 1, Weekly Exercise (Minutes/Week): 60, Intensity: Mild  Goals      Patient Stated   . Prevent falls (pt-stated)      Depression Screen PHQ 2/9 Scores 07/11/2019 10/20/2017 10/20/2017 10/04/2016  PHQ - 2 Score 0 3 0 0  PHQ- 9 Score - 14 - -    Fall Risk Fall Risk  07/11/2019 01/16/2017 12/05/2016 11/28/2016 10/04/2016  Falls in the past year? 1 Yes - Yes Yes  Number falls in past yr: 1 2 or more - 1 2 or more  Comment - - - - no injuries  Injury with Fall? 0 No - No No  Comment Fell down the stair - - - -  Risk for fall due to : History of fall(s) - (No Data) - -  Risk for fall due to: Comment - - no falls since last visit - -   Timed Get Up and Go performed: no, virtual visit  Cognitive Function:     6CIT Screen 07/11/2019  What Year? 0 points  What month? 0 points  What time? 0 points  Count back from 20 0 points  Months in reverse 0 points  Repeat phrase 0 points  Total Score 0    Screening Tests Health Maintenance  Topic Date Due  . HEMOGLOBIN A1C  08/02/2019  . FOOT EXAM  03/28/2020  . OPHTHALMOLOGY EXAM   05/30/2020  . COLONOSCOPY  03/31/2021  . TETANUS/TDAP  06/08/2029  . INFLUENZA VACCINE  Completed  . PNEUMOCOCCAL POLYSACCHARIDE VACCINE AGE 46-64 HIGH RISK  Completed  . Hepatitis C Screening  Completed  . HIV Screening  Completed      Plan:    Keep all routine maintenance appointments.   Follow up 07/12/19; discuss the need for a ramp to prevent falls and elevated toilet seat. [facility] consulted as a potential resource.    Medicare Attestation I have personally reviewed: The patient's medical and social history Their use of alcohol, tobacco or illicit drugs Their current medications and supplements The patient's functional ability including ADLs,fall risks, home safety risks, cognitive, and hearing and visual impairment Diet and physical activities Evidence for depression   In addition, I have reviewed and discussed with patient certain preventive protocols, quality metrics, and best practice recommendations. A written personalized care plan for preventive services as well as general preventive health recommendations were provided to patient via mail.     Varney Biles, LPN   [id number]

## 2019-07-11 NOTE — Telephone Encounter (Signed)
Pt notified of lab results. Pt has an appt with his PCP this Friday and I will contact him Monday to schedule a follow up with Dr. Allen Norris.

## 2019-07-12 ENCOUNTER — Ambulatory Visit (INDEPENDENT_AMBULATORY_CARE_PROVIDER_SITE_OTHER): Payer: Medicare Other | Admitting: Family Medicine

## 2019-07-12 ENCOUNTER — Other Ambulatory Visit: Payer: Self-pay

## 2019-07-12 ENCOUNTER — Telehealth: Payer: Self-pay

## 2019-07-12 ENCOUNTER — Encounter: Payer: Self-pay | Admitting: Family Medicine

## 2019-07-12 VITALS — BP 144/70 | HR 103 | Temp 98.0°F | Ht 71.0 in | Wt 197.2 lb

## 2019-07-12 DIAGNOSIS — R0600 Dyspnea, unspecified: Secondary | ICD-10-CM

## 2019-07-12 DIAGNOSIS — I10 Essential (primary) hypertension: Secondary | ICD-10-CM

## 2019-07-12 DIAGNOSIS — S39012A Strain of muscle, fascia and tendon of lower back, initial encounter: Secondary | ICD-10-CM

## 2019-07-12 DIAGNOSIS — W19XXXA Unspecified fall, initial encounter: Secondary | ICD-10-CM | POA: Diagnosis not present

## 2019-07-12 DIAGNOSIS — R0609 Other forms of dyspnea: Secondary | ICD-10-CM | POA: Diagnosis not present

## 2019-07-12 NOTE — Telephone Encounter (Signed)
Copied from [place] [id number]. Topic: Referral - Status >> Jul 12, 2019  [id number] PM Simone Curia D wrote: [id number] Spoke with patient about a commode chair which is covered by Medicare and ramp access for the home.  Sent message via Epic to [employer] regarding [employer] and [employer] information for commode chair. Emailed referral form to [employer]. Ambrose Mantle [phone]

## 2019-07-12 NOTE — Patient Instructions (Signed)
Nice to see you. We will contact you with your lab results. If you develop shortness of breath that does not resolve with rest or if you develop chest pain please seek medical attention immediately.

## 2019-07-12 NOTE — Progress Notes (Signed)
[name], MD Phone: [phone]  Michael Montgomery is a 58 y.o. male who presents today for follow-up.  Bilateral leg swelling: Patient notes this has been going on for about a month.  Does go down some when his legs are propped up and overnight.  He notes no orthopnea or PND though does have some dyspnea on exertion.  No dyspnea at rest.  No cough, wheezing, or fevers.  No COVID-19 exposure.  No chest pain.  He is on amlodipine.  No cardiac history.  Hypertension: Typically 130/70 when he is resting at home though does go up when he walks.  He is taking his blood pressure medications.  Fall: This occurred 2 weeks ago.  It occurred coming down his stairs.  He caught his heel.  No injury.  No loss of consciousness.  No head injury.  He wonders about getting a ramp at his home as he does have difficulty ambulating down stairs relating to his CIDP.  Back tightness: Patient reports his left low back feels tight at times.  Notes he has to help himself up when he bends over relating to this tightness.  No specific pain or weakness.  Stable leg function.  Social History   Tobacco Use  Smoking Status Former Smoker  . Types: Cigars  Smokeless Tobacco Former Systems developer  Tobacco Comment   one cigar once a month to once every 2 months      ROS see history of present illness  Objective  Physical Exam Vitals:   07/12/19 1525 07/12/19 1630  BP: (!) 160/70 (!) 144/70  Pulse: (!) 103   Temp: 98 F (36.7 C)   SpO2: 97%     BP Readings from Last 3 Encounters:  07/12/19 (!) 144/70  04/19/18 127/81  02/19/18 138/88   Wt Readings from Last 3 Encounters:  07/12/19 197 lb 3.2 oz (89.4 kg)  04/19/18 195 lb (88.5 kg)  02/19/18 192 lb (87.1 kg)    Physical Exam Constitutional:      General: He is not in acute distress.    Appearance: He is not diaphoretic.  Cardiovascular:     Rate and Rhythm: Normal rate and regular rhythm.     Heart sounds: Normal heart sounds.  Pulmonary:   Effort: Pulmonary effort is normal.     Breath sounds: Normal breath sounds.  Musculoskeletal:     Comments: 1+ pitting edema bilateral lower extremities  Skin:    General: Skin is warm and dry.  Neurological:     Mental Status: He is alert.     Comments: 5/5 strength bilateral quads and hamstrings, right dorsiflexion and plantarflexion, 4/5 left dorsiflexion and plantar flexion, sensation light touch intact bilateral lower extremities, absent patellar reflexes bilaterally    EKG: Normal sinus rhythm, rate 88, diffuse nonspecific T wave abnormality, no prior EKG to compare to,  Assessment/Plan: Please see individual problem list.  Essential hypertension Adequately controlled at home although elevated here.  We will obtain lab work.  We may change his amlodipine to an alternative medication given his swelling.  Will await his lab work.  DOE (dyspnea on exertion) This in combination with his swelling is concerning for a potential cardiac cause.  EKG and BNP ordered today.  Will obtain other lab work to evaluate a cause for swelling.  Given return precautions.  Fall Likely due to chronic leg weakness issues from his CIDP.  Discussed falls precautions.  LPN has already been in touch with [facility] regarding potential resources for rehab  placement.  Back strain Suspect back strain is contributing to his back discomfort.  Discussed likely completing physical therapy though his lab evaluation first.   Orders Placed This Encounter  Procedures  . CBC  . TSH  . Comp Met (CMET)  . B Nat Peptide  . EKG 12-Lead    No orders of the defined types were placed in this encounter.    [name], MD [place]

## 2019-07-12 NOTE — Assessment & Plan Note (Signed)
This in combination with his swelling is concerning for a potential cardiac cause.  EKG and BNP ordered today.  Will obtain other lab work to evaluate a cause for swelling.  Given return precautions.

## 2019-07-12 NOTE — Assessment & Plan Note (Signed)
Likely due to chronic leg weakness issues from his CIDP.  Discussed falls precautions.  LPN has already been in touch with [facility] regarding potential resources for rehab placement.

## 2019-07-12 NOTE — Assessment & Plan Note (Signed)
Adequately controlled at home although elevated here.  We will obtain lab work.  We may change his amlodipine to an alternative medication given his swelling.  Will await his lab work.

## 2019-07-13 LAB — CBC
HCT: 38 % — ABNORMAL LOW (ref 38.5–50.0)
Hemoglobin: 12.3 g/dL — ABNORMAL LOW (ref 13.2–17.1)
MCH: 29.9 pg (ref 27.0–33.0)
MCHC: 32.4 g/dL (ref 32.0–36.0)
MCV: 92.2 fL (ref 80.0–100.0)
MPV: 10.5 fL (ref 7.5–12.5)
Platelets: 226 10*3/uL (ref 140–400)
RBC: 4.12 10*6/uL — ABNORMAL LOW (ref 4.20–5.80)
RDW: 12.6 % (ref 11.0–15.0)
WBC: 5.9 10*3/uL (ref 3.8–10.8)

## 2019-07-13 LAB — COMPREHENSIVE METABOLIC PANEL
AG Ratio: 1.3 (calc) (ref 1.0–2.5)
ALT: 293 U/L — ABNORMAL HIGH (ref 9–46)
AST: 289 U/L — ABNORMAL HIGH (ref 10–35)
Albumin: 3.7 g/dL (ref 3.6–5.1)
Alkaline phosphatase (APISO): 96 U/L (ref 35–144)
BUN/Creatinine Ratio: 22 (calc) (ref 6–22)
BUN: 12 mg/dL (ref 7–25)
CO2: 27 mmol/L (ref 20–32)
Calcium: 9.2 mg/dL (ref 8.6–10.3)
Chloride: 101 mmol/L (ref 98–110)
Creat: 0.55 mg/dL — ABNORMAL LOW (ref 0.70–1.33)
Globulin: 2.8 g/dL (calc) (ref 1.9–3.7)
Glucose, Bld: 167 mg/dL — ABNORMAL HIGH (ref 65–99)
Potassium: 4.4 mmol/L (ref 3.5–5.3)
Sodium: 138 mmol/L (ref 135–146)
Total Bilirubin: 0.5 mg/dL (ref 0.2–1.2)
Total Protein: 6.5 g/dL (ref 6.1–8.1)

## 2019-07-13 LAB — BRAIN NATRIURETIC PEPTIDE: Brain Natriuretic Peptide: 50 pg/mL (ref <100)

## 2019-07-13 LAB — TSH: TSH: 1.26 mIU/L (ref 0.40–4.50)

## 2019-07-15 NOTE — Telephone Encounter (Signed)
Noted. Thank you for following up on this.

## 2019-07-16 ENCOUNTER — Other Ambulatory Visit: Payer: Self-pay | Admitting: Family Medicine

## 2019-07-16 DIAGNOSIS — S39012A Strain of muscle, fascia and tendon of lower back, initial encounter: Secondary | ICD-10-CM | POA: Insufficient documentation

## 2019-07-16 NOTE — Assessment & Plan Note (Signed)
Suspect back strain is contributing to his back discomfort.  Discussed likely completing physical therapy though his lab evaluation first.

## 2019-07-18 ENCOUNTER — Telehealth: Payer: Self-pay | Admitting: Family Medicine

## 2019-07-18 NOTE — Telephone Encounter (Signed)
Called and gave lab results and schedule a lab appointment in 1 week.  Michael Montgomery,cma

## 2019-07-18 NOTE — Telephone Encounter (Signed)
Patient returning call to [facility] for lab results. Please advise

## 2019-07-25 ENCOUNTER — Other Ambulatory Visit: Payer: Medicare Other

## 2019-07-26 ENCOUNTER — Other Ambulatory Visit: Payer: Self-pay

## 2019-07-26 ENCOUNTER — Other Ambulatory Visit (INDEPENDENT_AMBULATORY_CARE_PROVIDER_SITE_OTHER): Payer: Medicare Other

## 2019-07-26 DIAGNOSIS — I10 Essential (primary) hypertension: Secondary | ICD-10-CM | POA: Diagnosis not present

## 2019-07-26 LAB — MICROALBUMIN / CREATININE URINE RATIO
Creatinine,U: 101.6 mg/dL
Microalb Creat Ratio: 10.7 mg/g (ref 0.0–30.0)
Microalb, Ur: 10.8 mg/dL — ABNORMAL HIGH (ref 0.0–1.9)

## 2019-07-26 LAB — COMPREHENSIVE METABOLIC PANEL
ALT: 225 U/L — ABNORMAL HIGH (ref 0–53)
AST: 202 U/L — ABNORMAL HIGH (ref 0–37)
Albumin: 4.2 g/dL (ref 3.5–5.2)
Alkaline Phosphatase: 99 U/L (ref 39–117)
BUN: 13 mg/dL (ref 6–23)
CO2: 30 mEq/L (ref 19–32)
Calcium: 9.8 mg/dL (ref 8.4–10.5)
Chloride: 102 mEq/L (ref 96–112)
Creatinine, Ser: 0.68 mg/dL (ref 0.40–1.50)
GFR: 145.72 mL/min (ref >60.00)
Glucose, Bld: 153 mg/dL — ABNORMAL HIGH (ref 70–99)
Potassium: 4.1 mEq/L (ref 3.5–5.1)
Sodium: 141 mEq/L (ref 135–145)
Total Bilirubin: 0.4 mg/dL (ref 0.2–1.2)
Total Protein: 7.3 g/dL (ref 6.0–8.3)

## 2019-07-30 ENCOUNTER — Ambulatory Visit: Payer: Medicare Other | Admitting: Podiatry

## 2019-07-31 ENCOUNTER — Telehealth: Payer: Self-pay

## 2019-07-31 NOTE — Telephone Encounter (Signed)
Copied from [place] [phone]. Topic: Referral - Status >> Jul 31, 2019  123456 PM Simone Curia D wrote: [id number] Spoke with patient to update him on [facility]'s process regarding grab bar and ramp installation. Ambrose Mantle [phone]

## 2019-08-06 ENCOUNTER — Other Ambulatory Visit: Payer: Self-pay

## 2019-08-06 ENCOUNTER — Ambulatory Visit (INDEPENDENT_AMBULATORY_CARE_PROVIDER_SITE_OTHER): Payer: Medicare Other | Admitting: Gastroenterology

## 2019-08-06 ENCOUNTER — Encounter: Payer: Self-pay | Admitting: Gastroenterology

## 2019-08-06 VITALS — BP 135/74 | HR 97 | Temp 98.6°F | Wt 184.6 lb

## 2019-08-06 DIAGNOSIS — R748 Abnormal levels of other serum enzymes: Secondary | ICD-10-CM

## 2019-08-06 NOTE — Progress Notes (Signed)
Primary Care Physician: Leone Haven, MD  Primary Gastroenterologist:  Dr. Lucilla Lame  Chief Complaint  Patient presents with  . Follow-up    elevated liver enzymes    HPI: Michael Montgomery is a 58 y.o. male here for follow-up of abnormal liver enzymes.  The patient had been seen by a virtual visit previously due to abnormal liver enzymes.  The patient had reported at that time that he was drinking more than usual since he was home due to the pandemic.  The patient has been having waxing and waning abnormal liver enzymes for some time.  The levels were normal back in April 2019 and started to steadily increase in July 2019.  The patient's lab work did not show any cause for his abnormal liver enzymes.  He was noted to be immune to both hepatitis A and hepatitis B on his blood work.  The patient's ultrasound was unremarkable in May of this year. The patient states that he had been drinking up until approximately 2 weeks ago.  His most recent labs 11 days ago did not return to normal but did show a slight decrease.  He now reports that he drinks only 2 beers a week.  Current Outpatient Medications  Medication Sig Dispense Refill  . ACCU-CHEK SMARTVIEW test strip USE AS DIRECTED 100 each 3  . ACCU-CHEK SOFTCLIX LANCETS lancets Used to check blood glucose once daily 100 each 0  . albuterol (PROAIR HFA) 108 (90 Base) MCG/ACT inhaler 2 INH INHALATION EVERY FOUR TO SIX HOURS AS NEEDED    . Alcohol Swabs (ALCOHOL PREP) PADS Use once daily when checking blood sugar 100 each 1  . amLODipine (NORVASC) 10 MG tablet TAKE 1 TABLET BY MOUTH EVERY DAY 30 tablet 0  . atorvastatin (LIPITOR) 40 MG tablet TAKE 1 TABLET BY MOUTH DAILY 30 tablet 1  . clomiPHENE (CLOMID) 50 MG tablet Take 1/2 tablet daily 30 tablet 3  . escitalopram (LEXAPRO) 10 MG tablet TAKE 1 TABLET BY MOUTH EVERY DAY 90 tablet 2  . fluticasone (FLONASE) 50 MCG/ACT nasal spray Place 2 sprays into both nostrils daily. 16 g 6  .  gabapentin (NEURONTIN) 300 MG capsule Take 1 capsule by mouth 2 (two) times daily. Take 1 cap in AM and 2 caps at bedtime    . GAMUNEX-C 20 GM/200ML SOLN     . glucose blood (ACCU-CHEK AVIVA PLUS) test strip 1 each by Other route daily. Use as instructed 100 each 3  . hydrochlorothiazide (HYDRODIURIL) 12.5 MG tablet Take 1 tablet (12.5 mg total) by mouth daily. 90 tablet 1  . linaclotide (LINZESS) 72 MCG capsule Take 1 capsule (72 mcg total) by mouth daily before breakfast. 30 capsule 1  . loratadine (CLARITIN) 10 MG tablet TAKE 1 TABLET EVERY DAY 30 tablet 2  . losartan (COZAAR) 100 MG tablet TAKE 1 TABLET BY MOUTH EVERY DAY 90 tablet 3  . metFORMIN (GLUCOPHAGE) 500 MG tablet TAKE 2 TABLETS IN MORNING AND 1 TABLET AT NIGHT 180 tablet 5  . Multiple Vitamin (MULTIVITAMIN) capsule Take 1 capsule by mouth daily.    . Omega-3 Fatty Acids (FISH OIL PO) Take by mouth.    . sildenafil (VIAGRA) 50 MG tablet TAKE 1 TABLET (50 MG TOTAL) BY MOUTH DAILY AS NEEDED FOR ERECTILE DYSFUNCTION.     No current facility-administered medications for this visit.     Allergies as of 08/06/2019  . (No Known Allergies)    ROS:  General: Negative for  anorexia, weight loss, fever, chills, fatigue, weakness. ENT: Negative for hoarseness, difficulty swallowing , nasal congestion. CV: Negative for chest pain, angina, palpitations, dyspnea on exertion, peripheral edema.  Respiratory: Negative for dyspnea at rest, dyspnea on exertion, cough, sputum, wheezing.  GI: See history of present illness. GU:  Negative for dysuria, hematuria, urinary incontinence, urinary frequency, nocturnal urination.  Endo: Negative for unusual weight change.    Physical Examination:   BP 135/74 (BP Location: Right Arm, Patient Position: Sitting)   Pulse 97   Temp 98.6 F (37 C) (Oral)   Wt 184 lb 9.6 oz (83.7 kg)   BMI 25.75 kg/m   General: Well-nourished, well-developed in no acute distress.  Eyes: No icterus. Conjunctivae pink.  Lungs: Clear to auscultation bilaterally. Non-labored. Heart: Regular rate and rhythm, no murmurs rubs or gallops.  Abdomen: Bowel sounds are normal, nontender, nondistended, no hepatosplenomegaly or masses, no abdominal bruits or hernia , no rebound or guarding.   Extremities: No lower extremity edema. No clubbing or deformities. Neuro: Alert and oriented x 3.  Grossly intact. Skin: Warm and dry, no jaundice.   Psych: Alert and cooperative, normal mood and affect.  Labs:    Imaging Studies: No results found.  Assessment and Plan:   Michael Montgomery is a 58 y.o. y/o male who comes in for follow-up of abnormal liver enzymes.  The patient had been drinking more heavily while in quarantine for the pandemic. He reports that he has stopped drinking approximately 2 weeks ago.  The patient has been told that we would like to check his liver enzymes today and if they're going down because he stopped drinking then we will continue to monitor them.  If they have not significantly decreased then he may need to undergo her liver biopsy since his blood test did not show any cause for his of her enzymes.  The patient has been explained the plan and agrees with it.  This visit consisted of 20 minutes face to face contact with myself and at least 50% of this time was spent in counseling and education regarding diagnosis, treatment options, medication management, risks and benefits of treatment.   Lucilla Lame, MD. Marval Regal    Note: This dictation was prepared with Dragon dictation along with smaller phrase technology. Any transcriptional errors that result from this process are unintentional.

## 2019-08-07 ENCOUNTER — Encounter: Payer: Self-pay | Admitting: Podiatry

## 2019-08-07 ENCOUNTER — Telehealth: Payer: Self-pay

## 2019-08-07 ENCOUNTER — Other Ambulatory Visit: Payer: Self-pay

## 2019-08-07 ENCOUNTER — Ambulatory Visit (INDEPENDENT_AMBULATORY_CARE_PROVIDER_SITE_OTHER): Payer: Medicare Other | Admitting: Podiatry

## 2019-08-07 DIAGNOSIS — M79676 Pain in unspecified toe(s): Secondary | ICD-10-CM

## 2019-08-07 DIAGNOSIS — B351 Tinea unguium: Secondary | ICD-10-CM

## 2019-08-07 DIAGNOSIS — R748 Abnormal levels of other serum enzymes: Secondary | ICD-10-CM

## 2019-08-07 DIAGNOSIS — E119 Type 2 diabetes mellitus without complications: Secondary | ICD-10-CM

## 2019-08-07 LAB — HEPATIC FUNCTION PANEL
ALT: 322 IU/L — ABNORMAL HIGH (ref 0–44)
AST: 345 IU/L — ABNORMAL HIGH (ref 0–40)
Albumin: 4.3 g/dL (ref 3.8–4.9)
Alkaline Phosphatase: 139 IU/L — ABNORMAL HIGH (ref 39–117)
Bilirubin Total: 0.3 mg/dL (ref 0.0–1.2)
Bilirubin, Direct: 0.15 mg/dL (ref 0.00–0.40)
Total Protein: 6.9 g/dL (ref 6.0–8.5)

## 2019-08-07 NOTE — Telephone Encounter (Signed)
Pt notified of lab results. Pt agreed to move forward with liver biopsy. Orders placed and faxed. Will contact pt with a date once its scheduled.

## 2019-08-07 NOTE — Telephone Encounter (Signed)
-----   Message from Lucilla Lame, MD sent at 08/07/2019  7:15 AM EDT ----- Let the patient know that his liver enzymes markedly increased higher than they have been in the past.  He has been told that if his liver enzymes should go up he should undergo a liver biopsy.  The patient should be set up for a liver biopsy.

## 2019-08-07 NOTE — Progress Notes (Signed)
Complaint:  Visit Type: Patient returns to my office for continued preventative foot care services. Complaint: Patient states" my nails have grown long and thick and become painful to walk and wear shoes" Patient has been diagnosed with DM with no foot complications. The patient presents for preventative foot care services. No changes to ROS  Podiatric Exam: Vascular: dorsalis pedis and posterior tibial pulses are palpable bilateral. Capillary return is immediate. Temperature gradient is WNL. Skin turgor WNL  Sensorium: Normal Semmes Weinstein monofilament test. Normal tactile sensation bilaterally. Nail Exam: Pt has thick disfigured discolored nails with subungual debris noted bilateral entire nail hallux through fifth toenails Ulcer Exam: There is no evidence of ulcer or pre-ulcerative changes or infection. Orthopedic Exam: Muscle tone and strength are WNL. No limitations in general ROM. No crepitus or effusions noted. Hallux limitus  Right.  Hammer toes 2-4  B/L Skin: No Porokeratosis. No infection or ulcers  Diagnosis:  Onychomycosis, , Pain in right toe, pain in left toes  Treatment & Plan Procedures and Treatment: Consent by patient was obtained for treatment procedures. The patient understood the discussion of treatment and procedures well. All questions were answered thoroughly reviewed. Debridement of mycotic and hypertrophic toenails, 1 through 5 bilateral and clearing of subungual debris. No ulceration, no infection noted.  Return Visit-Office Procedure: Patient instructed to return to the office for a follow up visit 4 months for continued evaluation and treatment.    Gardiner Barefoot DPM

## 2019-08-13 ENCOUNTER — Other Ambulatory Visit: Payer: Self-pay | Admitting: Physician Assistant

## 2019-08-14 ENCOUNTER — Other Ambulatory Visit: Payer: Self-pay

## 2019-08-14 ENCOUNTER — Ambulatory Visit
Admission: RE | Admit: 2019-08-14 | Discharge: 2019-08-14 | Disposition: A | Discharge status: Home / Self Care | Payer: Medicare Other | Source: Ambulatory Visit | Attending: Gastroenterology | Admitting: Gastroenterology

## 2019-08-14 ENCOUNTER — Telehealth: Payer: Self-pay

## 2019-08-14 DIAGNOSIS — R748 Abnormal levels of other serum enzymes: Secondary | ICD-10-CM | POA: Diagnosis not present

## 2019-08-14 DIAGNOSIS — E7800 Pure hypercholesterolemia, unspecified: Secondary | ICD-10-CM | POA: Diagnosis not present

## 2019-08-14 DIAGNOSIS — R945 Abnormal results of liver function studies: Secondary | ICD-10-CM | POA: Diagnosis not present

## 2019-08-14 DIAGNOSIS — I10 Essential (primary) hypertension: Secondary | ICD-10-CM | POA: Insufficient documentation

## 2019-08-14 DIAGNOSIS — Z7984 Long term (current) use of oral hypoglycemic drugs: Secondary | ICD-10-CM | POA: Insufficient documentation

## 2019-08-14 DIAGNOSIS — E1144 Type 2 diabetes mellitus with diabetic amyotrophy: Secondary | ICD-10-CM | POA: Diagnosis not present

## 2019-08-14 DIAGNOSIS — Z79899 Other long term (current) drug therapy: Secondary | ICD-10-CM | POA: Diagnosis not present

## 2019-08-14 DIAGNOSIS — Z87891 Personal history of nicotine dependence: Secondary | ICD-10-CM | POA: Diagnosis not present

## 2019-08-14 LAB — GLUCOSE, CAPILLARY
Glucose-Capillary: 129 mg/dL — ABNORMAL HIGH (ref 70–99)
Glucose-Capillary: 113 mg/dL — ABNORMAL HIGH (ref 70–99)

## 2019-08-14 LAB — CBC
HCT: 42 % (ref 39.0–52.0)
Hemoglobin: 13.7 g/dL (ref 13.0–17.0)
MCH: 29.5 pg (ref 26.0–34.0)
MCHC: 32.6 g/dL (ref 30.0–36.0)
MCV: 90.5 fL (ref 80.0–100.0)
Platelets: 195 10*3/uL (ref 150–400)
RBC: 4.64 MIL/uL (ref 4.22–5.81)
RDW: 12.1 % (ref 11.5–15.5)
WBC: 4.1 10*3/uL (ref 4.0–10.5)
nRBC: 0 % (ref 0.0–0.2)

## 2019-08-14 LAB — PROTIME-INR
INR: 0.9 (ref 0.8–1.2)
Prothrombin Time: 12.4 seconds (ref 11.4–15.2)

## 2019-08-14 MED ORDER — MIDAZOLAM HCL 5 MG/5ML IJ SOLN
INTRAMUSCULAR | Status: AC | PRN
  Administered 2019-08-14: 1 mg via INTRAVENOUS

## 2019-08-14 MED ORDER — FENTANYL CITRATE (PF) 100 MCG/2ML IJ SOLN
INTRAMUSCULAR | Status: AC | PRN
  Administered 2019-08-14: 50 ug via INTRAVENOUS

## 2019-08-14 MED ORDER — MIDAZOLAM HCL 5 MG/5ML IJ SOLN
INTRAMUSCULAR | Status: AC
  Filled 2019-08-14: qty 5

## 2019-08-14 MED ORDER — OXYCODONE HCL 5 MG PO TABS: 5.0000 mg | ORAL_TABLET | ORAL | Status: DC | PRN

## 2019-08-14 MED ORDER — FENTANYL CITRATE (PF) 100 MCG/2ML IJ SOLN
INTRAMUSCULAR | Status: AC
  Filled 2019-08-14: qty 2

## 2019-08-14 MED ORDER — SODIUM CHLORIDE 0.9 % IV SOLN
INTRAVENOUS | Status: DC
  Administered 2019-08-14: 08:00:00 1000 mL via INTRAVENOUS

## 2019-08-14 NOTE — Procedures (Signed)
Interventional Radiology Procedure:   Indications: Abnormal liver enzymes  Procedure: US guided liver biopsy (random)  Findings: 3 cores from right hepatic lobe.  Gelfoam slurry injected after biopsy.    Complications: None     EBL: Minimal, less than 10 ml  Plan: Bedrest 3 hours, then discharge to home.    Lennin Osmond R. Anselm Pancoast, MD  Pager: [phone]

## 2019-08-14 NOTE — Telephone Encounter (Signed)
Copied from [place] [id number]. Topic: Referral - Status >> Aug 14, 2019  [id number] PM Simone Curia D wrote: [id number] Spoke with patient to follow up about ramp.  [facility] and he is on the waiting list. Ambrose Mantle [phone]

## 2019-08-14 NOTE — Consult Note (Signed)
Chief Complaint: Patient was seen in consultation today for US guided liver biopsy  at the request of [place]  Referring Physician(s): [place]  Patient Status: [facility] - Out-pt  History of Present Illness: Michael Montgomery is a 58 y.o. male with elevated liver enzymes and request for US guided liver biopsy.  Patient has no current complaints.  He has a chronic weakness and neuropathy on left side of his body affecting his left upper and lower extremities.  He uses a cane to ambulate.  Patient denies fevers or chills.  He has no chest pain, shortness of breath or abdominal pain.  He denies bowel or urinary symptoms.  Past Medical History:  Diagnosis Date  . Cataract march, april   bilateral removed   . Chickenpox   . Diabetes mellitus without complication (HCC)    metformin   . 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 Gastric ulcer   . Heart murmur    When he was younger, no recent issues  . High blood pressure    controlled with meds  . Hypercholesteremia    controlled with medication  . Knee injury    left  . Motion sickness    boats    Past Surgical History:  Procedure Laterality Date  . CATARACT EXTRACTION W/PHACO Right 02/01/2016   Procedure: CATARACT EXTRACTION PHACO AND INTRAOCULAR LENS PLACEMENT ([place]) right;  Surgeon: Ronnell Freshwater, MD;  Location: [facility];  Service: Ophthalmology;  Laterality: Right;  DIABETIC - oral meds  . CATARACT EXTRACTION W/PHACO Left 03/07/2016   Procedure: CATARACT EXTRACTION PHACO AND INTRAOCULAR LENS PLACEMENT ([facility]);  Surgeon: Ronnell Freshwater, MD;  Location: [facility];  Service: Ophthalmology;  Laterality: Left;  DIABETIC - oral meds   . circumscision      Allergies: Patient has no known allergies.  Medications: Prior to Admission medications   Medication Sig Start Date End Date Taking? Authorizing Provider  amLODipine (NORVASC) 10 MG tablet TAKE 1 TABLET BY MOUTH EVERY DAY  06/18/19  Yes Leone Haven, MD  atorvastatin (LIPITOR) 40 MG tablet TAKE 1 TABLET BY MOUTH DAILY 07/04/19  Yes Leone Haven, MD  clomiPHENE (CLOMID) 50 MG tablet Take 1/2 tablet daily 04/19/18  Yes McGowan, Larene Beach A, PA-C  escitalopram (LEXAPRO) 10 MG tablet TAKE 1 TABLET BY MOUTH EVERY DAY 10/15/18  Yes Leone Haven, MD  hydrochlorothiazide (HYDRODIURIL) 12.5 MG tablet Take 1 tablet (12.5 mg total) by mouth daily. 03/25/19  Yes Leone Haven, MD  losartan (COZAAR) 100 MG tablet TAKE 1 TABLET BY MOUTH EVERY DAY 07/16/19  Yes Leone Haven, MD  metFORMIN (GLUCOPHAGE) 500 MG tablet TAKE 2 TABLETS IN MORNING AND 1 TABLET AT NIGHT 03/27/19  Yes Leone Haven, MD  Multiple Vitamin (MULTIVITAMIN) capsule Take 1 capsule by mouth daily.   Yes [provider]  Omega-3 Fatty Acids (FISH OIL PO) Take by mouth.   Yes [provider]  ACCU-CHEK SMARTVIEW test strip USE AS DIRECTED 05/04/18   Leone Haven, MD  ACCU-CHEK SOFTCLIX LANCETS lancets Used to check blood glucose once daily 08/16/17   Leone Haven, MD  albuterol (PROAIR HFA) 108 (90 Base) MCG/ACT inhaler 2 INH INHALATION EVERY FOUR TO SIX HOURS AS NEEDED 09/18/16   [provider]  Alcohol Swabs (ALCOHOL PREP) PADS Use once daily when checking blood sugar 08/16/17   Leone Haven, MD  fluticasone (FLONASE) 50 MCG/ACT nasal spray Place 2 sprays  into both nostrils daily. 01/31/19   Leone Haven, MD  gabapentin (NEURONTIN) 300 MG capsule Take 1 capsule by mouth 2 (two) times daily. Take 1 cap in AM and 2 caps at bedtime 09/13/16   [provider]  GAMUNEX-C 20 GM/200ML SOLN  01/02/19   [provider]  glucose blood (ACCU-CHEK AVIVA PLUS) test strip 1 each by Other route daily. Use as instructed 08/16/17   Leone Haven, MD  linaclotide [facility]) 72 MCG capsule Take 1 capsule (72 mcg total) by mouth daily before breakfast. 02/12/19   Leone Haven, MD   loratadine (CLARITIN) 10 MG tablet TAKE 1 TABLET EVERY DAY 06/04/19   Leone Haven, MD  sildenafil (VIAGRA) 50 MG tablet TAKE 1 TABLET (50 MG TOTAL) BY MOUTH DAILY AS NEEDED FOR ERECTILE DYSFUNCTION. 02/12/16   [provider]     Family History  Problem Relation Age of Onset  . Alcoholism Father   . Hyperlipidemia Father   . Hypertension Father   . Throat cancer Father        smoked but had stopped 20 yeras before dx  . Diabetes Mother   . Emphysema Mother   . Kidney disease Neg Hx   . Prostate cancer Neg Hx   . Colon cancer Neg Hx   . Colon polyps Neg Hx   . Esophageal cancer Neg Hx   . Rectal cancer Neg Hx   . Stomach cancer Neg Hx   . Kidney cancer Neg Hx   . Bladder Cancer Neg Hx     Social History   Socioeconomic History  . Marital status: Married    Spouse name: Not on file  . Number of children: Not on file  . Years of education: Not on file  . Highest education level: Not on file  Occupational History  . Not on file  Social Needs  . Financial resource strain: Not hard at all  . Food insecurity    Worry: Never true    Inability: Never true  . Transportation needs    Medical: No    Non-medical: No  Tobacco Use  . Smoking status: Former Smoker    Types: Cigars  . Smokeless tobacco: Never Used  . Tobacco comment: one cigar once a month to once every 2 months   Substance and Sexual Activity  . Alcohol use: Yes    Alcohol/week: 5.0 standard drinks    Types: 5 Shots of liquor per week    Comment: pt stated " every once in a while"  . Drug use: No  . Sexual activity: Not on file  Lifestyle  . Physical activity    Days per week: 1 day    Minutes per session: 60 min  . Stress: Not at all  Relationships  . Social Herbalist on phone: Not on file    Gets together: Not on file    Attends religious service: Not on file    Active member of club or organization: Not on file    Attends meetings of clubs or organizations: Not on file     Relationship status: Not on file  Other Topics Concern  . Not on file  Social History Narrative  . Not on file      Review of Systems: A 12 point ROS discussed and pertinent positives are indicated in the HPI above.  All other systems are negative.  Review of Systems  Constitutional: Negative.   Respiratory: Negative.  Cardiovascular: Negative.   Gastrointestinal: Negative.   Genitourinary: Negative.   Musculoskeletal: Positive for gait problem.  Neurological: Positive for weakness.    Vital Signs: BP 140/80   Pulse 84   Temp 97.6 F (36.4 C) (Oral)   Resp 19   Ht 5\' 10"  (1.778 m)   Wt 84.4 kg   SpO2 100%   BMI 26.69 kg/m   Physical Exam Constitutional:      Appearance: Normal appearance.  Cardiovascular:     Rate and Rhythm: Normal rate and regular rhythm.     Heart sounds: Normal heart sounds.  Pulmonary:     Effort: Pulmonary effort is normal.     Breath sounds: Normal breath sounds.  Abdominal:     General: Abdomen is flat. There is no distension.     Palpations: Abdomen is soft.     Tenderness: There is no abdominal tenderness.  Neurological:     Mental Status: He is alert.     Motor: Weakness present.     Comments: Mild weakness in the left hand strength compared to the right.   Mallampati: 2  Imaging: No results found.  Labs:  CBC: Recent Labs    07/12/19 1550 08/14/19 0803  WBC 5.9 4.1  HGB 12.3* 13.7  HCT 38.0* 42.0  PLT 226 195    COAGS: Recent Labs    08/14/19 0803  INR 0.9    BMP: Recent Labs    01/31/19 1002 02/21/19 1101 07/12/19 1550 07/26/19 1128  NA 137 138 138 141  K 4.6 4.7 4.4 4.1  CL 101 101 101 102  CO2 28 29 27 30   GLUCOSE 152* 141* 167* 153*  BUN 13 17 12 13   CALCIUM 9.5 9.5 9.2 9.8  CREATININE 0.75 0.75 0.55* 0.68    LIVER FUNCTION TESTS: Recent Labs    03/06/19 0959 07/08/19 1154 07/12/19 1550 07/26/19 1128 08/06/19 1358  BILITOT 0.7 0.7 0.5 0.4 0.3  AST 179* 299* 289* 202* 345*  ALT 248*  324* 293* 225* 322*  ALKPHOS 118 86  --  99 139*  PROT 7.7 7.4 6.5 7.3 6.9  ALBUMIN 4.3 4.0  --  4.2 4.3    TUMOR MARKERS: No results for input(s): AFPTM, CEA, CA199, CHROMGRNA in the last 8760 hours.  Assessment and Plan:  58 year old with elevated liver enzymes and request for ultrasound-guided liver biopsy.  Risks and benefits of ultrasound-guided liver biopsy was discussed with the patient and/or patient's family including, but not limited to bleeding, infection, damage to adjacent structures or low yield requiring additional tests.  All of the questions were answered and there is agreement to proceed.  Consent signed and in chart.  Plan for ultrasound-guided random liver biopsy with moderate sedation.   Thank you for this interesting consult.  I greatly enjoyed meeting DONATELLO MCGAFFEY and look forward to participating in their care.  A copy of this report was sent to the requesting provider on this date.  Electronically Signed: Burman Riis, MD 08/14/2019, 8:31 AM   I spent a total of  15 Minutes   in face to face in clinical consultation, greater than 50% of which was counseling/coordinating care for abnormal liver enzymes.

## 2019-08-16 ENCOUNTER — Other Ambulatory Visit: Payer: Self-pay | Admitting: Family Medicine

## 2019-08-16 LAB — SURGICAL PATHOLOGY

## 2019-08-20 ENCOUNTER — Telehealth: Payer: Self-pay

## 2019-08-20 NOTE — Telephone Encounter (Signed)
Pt notified of liver biopsy results.

## 2019-08-20 NOTE — Telephone Encounter (Signed)
-----   Message from Lucilla Lame, MD sent at 08/19/2019  7:52 PM EDT ----- Let the patient know that his liver biopsy shows signs of healing liver injury likely from his past drinking.

## 2019-08-23 DIAGNOSIS — Z79899 Other long term (current) drug therapy: Secondary | ICD-10-CM | POA: Diagnosis not present

## 2019-08-23 DIAGNOSIS — G6181 Chronic inflammatory demyelinating polyneuritis: Secondary | ICD-10-CM | POA: Diagnosis not present

## 2019-08-23 DIAGNOSIS — R4189 Other symptoms and signs involving cognitive functions and awareness: Secondary | ICD-10-CM | POA: Diagnosis not present

## 2019-08-26 ENCOUNTER — Emergency Department
Admission: EM | Admit: 2019-08-26 | Discharge: 2019-08-27 | Disposition: A | Discharge status: Home / Self Care | Payer: Medicare Other | Attending: Emergency Medicine | Admitting: Emergency Medicine

## 2019-08-26 ENCOUNTER — Emergency Department: Payer: Medicare Other

## 2019-08-26 ENCOUNTER — Other Ambulatory Visit: Payer: Self-pay

## 2019-08-26 ENCOUNTER — Telehealth: Payer: Self-pay | Admitting: *Deleted

## 2019-08-26 DIAGNOSIS — J988 Other specified respiratory disorders: Secondary | ICD-10-CM | POA: Diagnosis not present

## 2019-08-26 DIAGNOSIS — B9789 Other viral agents as the cause of diseases classified elsewhere: Secondary | ICD-10-CM

## 2019-08-26 DIAGNOSIS — R197 Diarrhea, unspecified: Secondary | ICD-10-CM | POA: Diagnosis not present

## 2019-08-26 DIAGNOSIS — J069 Acute upper respiratory infection, unspecified: Secondary | ICD-10-CM | POA: Insufficient documentation

## 2019-08-26 DIAGNOSIS — Z209 Contact with and (suspected) exposure to unspecified communicable disease: Secondary | ICD-10-CM | POA: Diagnosis not present

## 2019-08-26 DIAGNOSIS — R0602 Shortness of breath: Secondary | ICD-10-CM | POA: Diagnosis not present

## 2019-08-26 DIAGNOSIS — R05 Cough: Secondary | ICD-10-CM | POA: Diagnosis not present

## 2019-08-26 DIAGNOSIS — U071 COVID-19: Secondary | ICD-10-CM | POA: Diagnosis not present

## 2019-08-26 DIAGNOSIS — Z20822 Contact with and (suspected) exposure to covid-19: Secondary | ICD-10-CM

## 2019-08-26 DIAGNOSIS — Z79899 Other long term (current) drug therapy: Secondary | ICD-10-CM | POA: Diagnosis not present

## 2019-08-26 DIAGNOSIS — R531 Weakness: Secondary | ICD-10-CM | POA: Diagnosis not present

## 2019-08-26 DIAGNOSIS — Z7984 Long term (current) use of oral hypoglycemic drugs: Secondary | ICD-10-CM | POA: Diagnosis not present

## 2019-08-26 DIAGNOSIS — Z87891 Personal history of nicotine dependence: Secondary | ICD-10-CM | POA: Diagnosis not present

## 2019-08-26 DIAGNOSIS — G6181 Chronic inflammatory demyelinating polyneuritis: Secondary | ICD-10-CM

## 2019-08-26 DIAGNOSIS — E119 Type 2 diabetes mellitus without complications: Secondary | ICD-10-CM | POA: Insufficient documentation

## 2019-08-26 DIAGNOSIS — I10 Essential (primary) hypertension: Secondary | ICD-10-CM | POA: Diagnosis not present

## 2019-08-26 MED ORDER — ALBUTEROL SULFATE HFA 108 (90 BASE) MCG/ACT IN AERS: INHALATION_SPRAY | RESPIRATORY_TRACT | 1 refills | Status: DC

## 2019-08-26 MED ORDER — ONDANSETRON 4 MG PO TBDP: ORAL_TABLET | ORAL | 0 refills | Status: DC

## 2019-08-26 MED ORDER — ONDANSETRON HCL 4 MG/2ML IJ SOLN
4.0000 mg | INTRAMUSCULAR | Status: AC
  Administered 2019-08-27: 01:00:00 4 mg via INTRAVENOUS
  Filled 2019-08-26: qty 2

## 2019-08-26 NOTE — Telephone Encounter (Signed)
It may be helpful to have him see an occupational therapist to see if they can help him with the things he is having issues with. I'm not sure he would qualify for an electric wheelchair and it may be best to have PT involved as well to help determine need for assistive devices.

## 2019-08-26 NOTE — ED Provider Notes (Signed)
[facility] Emergency Department Provider Note  ____________________________________________   First MD Initiated Contact with Patient 08/26/19 2314     (approximate)  I have reviewed the triage vital signs and the nursing notes.   HISTORY  Chief Complaint covid-19    HPI Michael Montgomery is a 58 y.o. male with medical history as listed below who presents by EMS for evaluation of possible COVID-19.  He reports that he had acute onset of symptoms today that include shortness of breath particular with exertion, mild cough, generalized weakness, body aches all over his entire body, nausea, vomiting, diarrhea, and loss of smell and taste.   He has not had any fever or chills.  He denies chest pain and abdominal pain.  He reports that he has 2 grandkids that tested positive for COVID-19 within the last couple of weeks.  He said that his symptoms are severe nothing particular is making them better or worse.  He said that he knows there probably is not that much to do but he feels very sick and wanted to be checked out.  He does not smoke tobacco and has no history of asthma nor COPD.  He is diabetic with a history of diabetic neuropathy.        Past Medical History:  Diagnosis Date  . Cataract march, april   bilateral removed   . Chickenpox   . Diabetes mellitus without complication (HCC)    metformin   . Diabetic amyotrophy [facility])    sees dr at [facility]  . Gastric ulcer   . Heart murmur    When he was younger, no recent issues  . High blood pressure    controlled with meds  . Hypercholesteremia    controlled with medication  . Knee injury    left  . Motion sickness    boats    Patient Active Problem List   Diagnosis Date Noted  . Back strain 07/16/2019  . DOE (dyspnea on exertion) 07/12/2019  . Fall 07/12/2019  . Elevated LFTs 02/08/2019  . Rotator cuff impingement syndrome of left shoulder 02/19/2018  . Depression, major, single episode, complete  remission ([place]) 10/20/2017  . Hypogonadism in male 08/09/2017  . Attention deficit 02/14/2017  . Constipation 01/18/2017  . Mood swings 09/07/2016  . Allergic rhinitis 09/07/2016  . Poor dentition 09/07/2016  . Hyperlipidemia 05/24/2016  . Sleeping difficulty 04/21/2016  . CIDP (chronic inflammatory demyelinating polyneuropathy) ([place]) 04/11/2016  . Erectile dysfunction of organic origin 03/17/2016  . Phimosis 03/17/2016  . BPH with obstruction/lower urinary tract symptoms 03/17/2016  . Gingivitis 03/01/2016  . Diabetes ([place]) 02/07/2016  . Erectile dysfunction 02/07/2016  . Muscle wasting and atrophy, not elsewhere classified, unspecified lower leg 01/21/2016  . Left leg weakness 01/21/2016  . Essential hypertension 01/19/2016  . Cataracts, bilateral 01/19/2016  . Left knee pain 01/19/2016    Past Surgical History:  Procedure Laterality Date  . CATARACT EXTRACTION W/PHACO Right 02/01/2016   Procedure: CATARACT EXTRACTION PHACO AND INTRAOCULAR LENS PLACEMENT ([place]) right;  Surgeon: Ronnell Freshwater, MD;  Location: [facility];  Service: Ophthalmology;  Laterality: Right;  DIABETIC - oral meds  . CATARACT EXTRACTION W/PHACO Left 03/07/2016   Procedure: CATARACT EXTRACTION PHACO AND INTRAOCULAR LENS PLACEMENT ([facility]);  Surgeon: Ronnell Freshwater, MD;  Location: [facility];  Service: Ophthalmology;  Laterality: Left;  DIABETIC - oral meds   . circumscision      Prior to Admission medications   Medication Sig Start  Date End Date Taking? Authorizing Provider  ACCU-CHEK AVIVA PLUS test strip USE AS DIRECTED 08/19/19   Leone Haven, MD  ACCU-CHEK SMARTVIEW test strip USE AS DIRECTED 05/04/18   Leone Haven, MD  ACCU-CHEK SOFTCLIX LANCETS lancets Used to check blood glucose once daily 08/16/17   Leone Haven, MD  albuterol (VENTOLIN HFA) 108 (90 Base) MCG/ACT inhaler Inhale 2-4 puffs by mouth every 4 hours as needed for wheezing, cough, and/or  shortness of breath 08/26/19   Hinda Kehr, MD  Alcohol Swabs (ALCOHOL PREP) PADS Use once daily when checking blood sugar 08/16/17   Leone Haven, MD  amLODipine (NORVASC) 10 MG tablet TAKE 1 TABLET BY MOUTH EVERY DAY 06/18/19   Leone Haven, MD  atorvastatin (LIPITOR) 40 MG tablet TAKE 1 TABLET BY MOUTH DAILY 07/04/19   Leone Haven, MD  clomiPHENE (CLOMID) 50 MG tablet Take 1/2 tablet daily 04/19/18   Zara Council A, PA-C  escitalopram (LEXAPRO) 10 MG tablet TAKE 1 TABLET BY MOUTH EVERY DAY 10/15/18   Leone Haven, MD  fluticasone (FLONASE) 50 MCG/ACT nasal spray Place 2 sprays into both nostrils daily. 01/31/19   Leone Haven, MD  gabapentin (NEURONTIN) 300 MG capsule Take 1 capsule by mouth 2 (two) times daily. Take 1 cap in AM and 2 caps at bedtime 09/13/16   [provider]  GAMUNEX-C 20 GM/200ML SOLN  01/02/19   [provider]  glucose blood (ACCU-CHEK AVIVA PLUS) test strip 1 each by Other route daily. Use as instructed 08/16/17   Leone Haven, MD  hydrochlorothiazide (HYDRODIURIL) 12.5 MG tablet Take 1 tablet (12.5 mg total) by mouth daily. 03/25/19   Leone Haven, MD  linaclotide Rolan Lipa) 72 MCG capsule Take 1 capsule (72 mcg total) by mouth daily before breakfast. 02/12/19   Leone Haven, MD  loratadine (CLARITIN) 10 MG tablet TAKE 1 TABLET EVERY DAY 06/04/19   Leone Haven, MD  losartan (COZAAR) 100 MG tablet TAKE 1 TABLET BY MOUTH EVERY DAY 07/16/19   Leone Haven, MD  metFORMIN (GLUCOPHAGE) 500 MG tablet TAKE 2 TABLETS IN MORNING AND 1 TABLET AT NIGHT 03/27/19   Leone Haven, MD  Multiple Vitamin (MULTIVITAMIN) capsule Take 1 capsule by mouth daily.    [provider]  Omega-3 Fatty Acids (FISH OIL PO) Take by mouth.    [provider]  ondansetron (ZOFRAN ODT) 4 MG disintegrating tablet Allow 1-2 tablets to dissolve in your mouth every 8 hours as needed for nausea/vomiting 08/26/19    Hinda Kehr, MD  sildenafil (VIAGRA) 50 MG tablet TAKE 1 TABLET (50 MG TOTAL) BY MOUTH DAILY AS NEEDED FOR ERECTILE DYSFUNCTION. 02/12/16   [provider]    Allergies Patient has no known allergies.  Family History  Problem Relation Age of Onset  . Alcoholism Father   . Hyperlipidemia Father   . Hypertension Father   . Throat cancer Father        smoked but had stopped 20 yeras before dx  . Diabetes Mother   . Emphysema Mother   . Kidney disease Neg Hx   . Prostate cancer Neg Hx   . Colon cancer Neg Hx   . Colon polyps Neg Hx   . Esophageal cancer Neg Hx   . Rectal cancer Neg Hx   . Stomach cancer Neg Hx   . Kidney cancer Neg Hx   . Bladder Cancer Neg Hx     Social History  Social History   Tobacco Use  . Smoking status: Former Smoker    Types: Cigars  . Smokeless tobacco: Never Used  . Tobacco comment: one cigar once a month to once every 2 months   Substance Use Topics  . Alcohol use: Yes    Alcohol/week: 5.0 standard drinks    Types: 5 Shots of liquor per week    Comment: pt stated " every once in a while"  . Drug use: No    Review of Systems Constitutional: General malaise.  No fever/chills Eyes: No visual changes. ENT: Loss of smell and taste.  No sore throat. Cardiovascular: Denies chest pain. Respiratory: Shortness of breath with exertion.  Mild cough. Gastrointestinal: Severe nausea, vomiting, and diarrhea.  No abdominal pain. Genitourinary: Negative for dysuria. Musculoskeletal: Generalized body aches "everywhere".  Negative for neck pain.  Negative for back pain. Integumentary: Negative for rash. Neurological: Negative for headaches, focal weakness or numbness.   ____________________________________________   PHYSICAL EXAM:  VITAL SIGNS: ED Triage Vitals  Enc Vitals Group     BP 08/26/19 2251 (!) 154/86     Pulse Rate 08/26/19 2251 94     Resp 08/26/19 2251 19     Temp 08/26/19 2251 98.9 F (37.2 C)     Temp Source 08/26/19  2251 Oral     SpO2 08/26/19 2251 98 %     Weight 08/26/19 2248 84.4 kg (186 lb)     Height 08/26/19 2248 1.778 m (5\' 10" )     Head Circumference --      Peak Flow --      Pain Score 08/26/19 2247 7     Pain Loc --      Pain Edu? --      Excl. in [place]? --     Constitutional: Alert and oriented.  Well-appearing in spite of his medical comorbidities and current chief complaints. Eyes: Conjunctivae are normal.  Head: Atraumatic. Nose: No congestion/rhinnorhea. Mouth/Throat: Patient is wearing a mask. Neck: No stridor.  No meningeal signs.   Cardiovascular: Normal rate, regular rhythm. Good peripheral circulation. Grossly normal heart sounds. Respiratory: Normal respiratory effort.  No retractions. Gastrointestinal: Soft and nontender. No distention.  Musculoskeletal: No lower extremity tenderness nor edema. No gross deformities of extremities. Neurologic:  Normal speech and language. No gross focal neurologic deficits are appreciated.  Skin:  Skin is warm, dry and intact. Psychiatric: Mood and affect are normal. Speech and behavior are normal.  ____________________________________________   LABS (all labs ordered are listed, but only abnormal results are displayed)  Labs Reviewed  BASIC METABOLIC PANEL - Abnormal; Notable for the following components:      Result Value   Glucose, Bld 143 (*)    BUN 24 (*)    All other components within normal limits  SARS CORONAVIRUS 2 (TAT 6-24 HRS)  CBC WITH DIFFERENTIAL/PLATELET   ____________________________________________  EKG  None - EKG not ordered by ED physician ____________________________________________  RADIOLOGY Ursula Alert, personally viewed and evaluated these images (plain radiographs) as part of my medical decision making, as well as reviewing the written report by the radiologist.  ED MD interpretation: No indication of infiltrate on chest x-ray  Official radiology report(s): Dg Chest Portable 1 View  Result  Date: 08/26/2019 CLINICAL DATA:  Cough, shortness of breath EXAM: PORTABLE CHEST 1 VIEW COMPARISON:  02/03/2006 FINDINGS: Heart and mediastinal contours are within normal limits. No focal opacities or effusions. No acute bony abnormality. IMPRESSION: No active disease. Electronically Signed  By: Rolm Baptise M.D.   On: 08/26/2019 23:29    ____________________________________________   PROCEDURES   Procedure(s) performed (including Critical Care):  Procedures   ____________________________________________   INITIAL IMPRESSION / MDM / [place] / ED COURSE  As part of my medical decision making, I reviewed the following data within the [place] notes reviewed and incorporated, Labs reviewed , Old chart reviewed, Radiograph reviewed , Notes from prior ED visits and [place] Controlled Substance Database   Differential diagnosis includes, but is not limited to, COVID-19, community-acquired pneumonia, other nonspecific viral respiratory infection, less likely urinary tract infection or intra-abdominal infection such as appendicitis or diverticulitis.  The patient is well appearing in spite his medical history and his current complaints.  He is awake, alert, speaking in full and complete sentences without difficulty, and is in no distress.  Vital signs are all stable and within normal limits including his oxygenation and his heart rate.  He has a reassuring physical exam including no tenderness to palpation of his abdomen.  His chest x-ray is clear.  I had my usual and customary COVID-19 discussion with the patient and explained that at this point based on his evaluation he does not meet criteria for admission to the hospital.  We talked about self-care at home as well as isolation.  Based on his history of numerous episodes of diarrhea and vomiting I will check some basic lab work to make sure his electrolytes are within normal limits and his renal function is  normal, but he understands he needs to stay at home, drink plenty of fluids, and use the medications as prescribed below according to instructions for symptomatic care.  I gave my usual and customary return precautions.  He understands and agrees with the plan.      Clinical Course as of Aug 26 424  Mon Aug 26, 2019  2355 Of note, given the patient's diabetes history, I will not prescribe any steroids at this point given that he is not having any respiratory issues (at least at rest), normal chest x-ray, normal oxygenation.  I am afraid that even a single dose of Decadron which is unlikely to be all that helpful clinically will substantially affect his glucose and I do not want to create unnecessary issues for questionable clinical benefit.   [CF]  Tue Aug 27, 2019  0026 Normal CBC  CBC with Differential/Platelet [CF]  0038 In spite of the patient's reported numerous episodes of diarrhea and vomiting, he fortunately has a normal basic metabolic panel except for a very slightly elevated BUN, but normal anion gap, normal potassium, normal sodium, etc.  The patient is appropriate and stable for discharge with outpatient follow-up as previously described.  Basic metabolic panel(!) [CF]    Clinical Course User Index [CF] Hinda Kehr, MD     ____________________________________________  FINAL CLINICAL IMPRESSION(S) / ED DIAGNOSES  Final diagnoses:  Suspected COVID-19 virus infection  Viral respiratory infection     MEDICATIONS GIVEN DURING THIS VISIT:  Medications  ondansetron (ZOFRAN) injection 4 mg (4 mg Intravenous Given 08/27/19 0042)     ED Discharge Orders         Ordered    ondansetron (ZOFRAN ODT) 4 MG disintegrating tablet     08/26/19 2356    albuterol (VENTOLIN HFA) 108 (90 Base) MCG/ACT inhaler     08/26/19 2356          *Please note:  Alysia Penna Splawn was evaluated in Emergency  Department on 08/27/2019 for the symptoms described in the history of present  illness. He was evaluated in the context of the global COVID-19 pandemic, which necessitated consideration that the patient might be at risk for infection with the SARS-CoV-2 virus that causes COVID-19. Institutional protocols and algorithms that pertain to the evaluation of patients at risk for COVID-19 are in a state of rapid change based on information released by regulatory bodies including the CDC and federal and state organizations. These policies and algorithms were followed during the patient's care in the ED.  Some ED evaluations and interventions may be delayed as a result of limited staffing during the pandemic.*  Note:  This document was prepared using Dragon voice recognition software and may include unintentional dictation errors.   Hinda Kehr, MD 08/27/19 [phone]

## 2019-08-26 NOTE — Telephone Encounter (Signed)
Copied from [place] [id number]. Topic: General - Inquiry >> Aug 26, 2019  1:05 PM Scherrie Gerlach wrote: Reason for CRM: pt wants to know what he can do to get an electric wheelchair, and a ramp? Pt states it is getting hard for him to get around, getting off toilet, out of car, and wants to knowhow he can go about this?

## 2019-08-26 NOTE — Discharge Instructions (Addendum)
As we discussed, we believe your symptoms are caused by a respiratory virus.  However, because we cannot rule out the possibility of COVID-19 at this time, we sent a nasal swab test from the Emergency Department (ED).  Please read through the included information in this paperwork for information about how to set up a MyChart account so that you can log in over the next couple of days for your test results (including COVID-19 swab results if one was obtained during your Emergency Department visit).  Your lab work, vital signs, and chest x-ray were all normal tonight, which is reasuring.  Read through all the included information including the recommendations from the CDC.  If your test is positive, we recommend that you self-quarantine at home for 10-14 days after your fever has gone away (without taking medication to make your temperature come down, such as Tylenol (acetaminophen)), after your respiratory symptoms have improved, and after at least 14 days have passed since your symptoms first appeared.  You should have as minimal contact as possible with anyone else including close family as per the [facility] paperwork guidelines listed below. Follow-up with your doctor by phone or online as needed and return immediately to the emergency department or call 911 only if you develop new or worsening symptoms that concern you.  You can find up-to-date information about COVID-19 in [place] by calling the [place]: [phone]. You may also call 2-1-1, or [phone], or additional resources.  You can also find information online at https://miller-white.com/, or on the Center for Disease Control (CDC) website at BeginnerSteps.be.

## 2019-08-26 NOTE — ED Triage Notes (Signed)
PT to ED via EMS from home. PT lives with covid positive pt's. Today has started having symptoms such as N/V/D, SOB, loss of taste and smell. PT alert and oriented, VSS per EMS

## 2019-08-27 ENCOUNTER — Telehealth: Payer: Self-pay

## 2019-08-27 ENCOUNTER — Other Ambulatory Visit: Payer: Self-pay

## 2019-08-27 ENCOUNTER — Telehealth: Payer: Self-pay | Admitting: Emergency Medicine

## 2019-08-27 DIAGNOSIS — U071 COVID-19: Secondary | ICD-10-CM | POA: Diagnosis not present

## 2019-08-27 DIAGNOSIS — G6181 Chronic inflammatory demyelinating polyneuritis: Secondary | ICD-10-CM

## 2019-08-27 LAB — CBC WITH DIFFERENTIAL/PLATELET
Abs Immature Granulocytes: 0.05 10*3/uL (ref 0.00–0.07)
Basophils Absolute: 0 10*3/uL (ref 0.0–0.1)
Basophils Relative: 0 %
Eosinophils Absolute: 0.1 10*3/uL (ref 0.0–0.5)
Eosinophils Relative: 2 %
HCT: 45.5 % (ref 39.0–52.0)
Hemoglobin: 14.5 g/dL (ref 13.0–17.0)
Immature Granulocytes: 1 %
Lymphocytes Relative: 24 %
Lymphs Abs: 2.1 10*3/uL (ref 0.7–4.0)
MCH: 28.9 pg (ref 26.0–34.0)
MCHC: 31.9 g/dL (ref 30.0–36.0)
MCV: 90.8 fL (ref 80.0–100.0)
Monocytes Absolute: 0.6 10*3/uL (ref 0.1–1.0)
Monocytes Relative: 7 %
Neutro Abs: 5.7 10*3/uL (ref 1.7–7.7)
Neutrophils Relative %: 66 %
Platelets: 312 10*3/uL (ref 150–400)
RBC: 5.01 MIL/uL (ref 4.22–5.81)
RDW: 12.3 % (ref 11.5–15.5)
WBC: 8.6 10*3/uL (ref 4.0–10.5)
nRBC: 0 % (ref 0.0–0.2)

## 2019-08-27 LAB — BASIC METABOLIC PANEL
Anion gap: 11 (ref 5–15)
BUN: 24 mg/dL — ABNORMAL HIGH (ref 6–20)
CO2: 28 mmol/L (ref 22–32)
Calcium: 9 mg/dL (ref 8.9–10.3)
Chloride: 102 mmol/L (ref 98–111)
Creatinine, Ser: 0.63 mg/dL (ref 0.61–1.24)
GFR calc Af Amer: >60 mL/min (ref >60)
GFR calc non Af Amer: >60 mL/min (ref >60)
Glucose, Bld: 143 mg/dL — ABNORMAL HIGH (ref 70–99)
Potassium: 3.7 mmol/L (ref 3.5–5.1)
Sodium: 141 mmol/L (ref 135–145)

## 2019-08-27 LAB — SARS CORONAVIRUS 2 (TAT 6-24 HRS): SARS Coronavirus 2: POSITIVE — AB

## 2019-08-27 MED ORDER — SUNBEAM INCONTINENT UNDER PAD MISC: 3 refills | Status: DC

## 2019-08-27 MED ORDER — "SIMPLICITY ADULT BRIEF 45""-58"" MISC": 1.0000 | 3 refills | Status: DC | PRN

## 2019-08-27 NOTE — Telephone Encounter (Signed)
It sounds as though things have changed since the last time I saw him. I will forward to Juliann Pulse to see if she can place the DME orders for these things. Can you see if someone in the office can sign off on them? If they are not able to sign off on them I can sign them tomorrow when I am back in the office. The Dx code is G61.81. Please also find out if the patient has discussed these changes with his neurologist. It sounds as though his CIDP has gotten worse and he needs to follow-up with neurology for that.  We can write a prescription for a regular wheel chair as well.

## 2019-08-27 NOTE — Telephone Encounter (Signed)
Copied from [place] [id number]. Topic: General - Inquiry >> Aug 27, 2019  1:57 PM Virl Axe D wrote: Reason for CRM: Pt's wife called to request a medical alert bracelet for pt. She wants to know if Dr. Caryl Bis can order this? Please advise.

## 2019-08-27 NOTE — Telephone Encounter (Signed)
Copied from [place] [id number]. Topic: General - Other >> Aug 27, 2019  9:29 AM Michael Montgomery wrote: Reason for CRM: pt would like to have his wife updated on his current situation. Pt says that he is going through a lot.    CBJethro Bastos [phone]

## 2019-08-27 NOTE — Telephone Encounter (Signed)
I do not believe that is not something that I can order. She would have to call the company to get this for him.

## 2019-08-27 NOTE — Telephone Encounter (Signed)
Pt wife called stating she wants to add a hand urinal to the DME and medical alert. Thank you!

## 2019-08-27 NOTE — Telephone Encounter (Signed)
Pt's wife called to request a medical alert bracelet for pt. She wants to know if Dr. Caryl Bis can order this? Please advise.  Michael Montgomery,cma

## 2019-08-27 NOTE — Telephone Encounter (Signed)
Patient notified everything sent to [facility].

## 2019-08-27 NOTE — Telephone Encounter (Signed)
Wife called upset and states that the patient cannot walk and she needs things and she wants them taken care of today, patient needs a bedside commode, diapers, and pads to put under him in the bed and she wants them to go to [employer] [facility] in [place] She also needs a wheelchair asap because the patient cannot walk.  Please advise.  She wants theses things today she states.  Omolara Carol,cma

## 2019-08-27 NOTE — Telephone Encounter (Signed)
Called patient to inform him of positive covid 19 result and cdc guidelines for isolation and contact quarantine.  Says his family is getting tested as well.

## 2019-08-28 NOTE — Telephone Encounter (Signed)
Talked with patient wife and assumed her we have sent all DME and script called [facility] and verified there.

## 2019-08-29 NOTE — Telephone Encounter (Signed)
Juliann Pulse called and spoke with the wife and got all taking care of.  Azarian Starace,cma

## 2019-09-01 ENCOUNTER — Other Ambulatory Visit: Payer: Self-pay | Admitting: Family Medicine

## 2019-09-02 NOTE — Telephone Encounter (Signed)
Pt states [facility] does not handle electric wheelchair.  Needs this sent somewhere else that does.

## 2019-09-09 NOTE — Telephone Encounter (Signed)
I faxed the DME after getting a signature from Philis Nettle I received a confmation.  Michael Montgomery,cma

## 2019-09-09 NOTE — Telephone Encounter (Signed)
I faxed the wheelchair paperwork on Friday 09/06/2019 to [facility].  [name],cma

## 2019-09-09 NOTE — Telephone Encounter (Signed)
I have placed DME order will have NP sign in place of PCP out of office. Please fax to [facility].

## 2019-09-09 NOTE — Telephone Encounter (Signed)
Pt is requesting a rx/order for a standard walker to be sent to [facility]. He would like to have this done as soon as possible as he is not comfortable using a cane when he is alone. Please advise.

## 2019-09-10 NOTE — Telephone Encounter (Signed)
I called and informed the patient that I had not heard back from the [facility] let him know that I faxed over the documents to them for the electric wheelchair and I am waiting for a response. Patient understood.    [name],cma

## 2019-09-10 NOTE — Telephone Encounter (Signed)
Pt wants to check the status of his electric wheel chair order. [facility] does not do electric wheelchairs and Pt was advise order would be placed with another office/ please advise Pt of the order status

## 2019-09-11 ENCOUNTER — Telehealth: Payer: Self-pay | Admitting: Family Medicine

## 2019-09-11 NOTE — Telephone Encounter (Signed)
In order to get home health aid he will need an in office visit for insurance purposes but cant come to the office x 21 days with + covid testing 14 days ago   OR   Or contact neurology for in person visit with them to see if they can help coordinate this   This is easier to coordinate from the hospital instead of outpatient but if needed outpatient we have to have a in person visit  Due to him having covid he cant have office visit here for 21 days post covid diagnosis  If leg weakness worse go to ED and social work can help coordinate home services and he can be evaluated for leg weakness    There are no options for covid 19 testing where they come to him  He has to go to testing or set up his own test with [facility] [address] online and do his own test in the car but has to get far up in his nose to get a good sample    What would he like to do?   [place]

## 2019-09-11 NOTE — Telephone Encounter (Signed)
Patient calling and states that he is needing to get a home health aide to come in and help him since his wife has to go back to work within the next few weeks. States that he has difficulty getting around due to leg weakness.  Also, would like to know if there are any options for getting retested for COVID that are able to come to him? States that he is unable to get out. Please advise. [name],cma

## 2019-09-11 NOTE — Telephone Encounter (Signed)
Patient calling and states that he is needing to get a home health aide to come in and help him since his wife has to go back to work within the next few weeks. States that he has difficulty getting around due to leg weakness.  Also, would like to know if there are any options for getting retested for COVID that are able to come to him? States that he is unable to get out. Please advise.

## 2019-09-11 NOTE — Telephone Encounter (Signed)
[place] fax this to neurology Dr. Lanelle Bal neurology please   In order to get home health aid he will need an in office visit for insurance purposes but cant come to the office x 21 days with + covid testing 14 days ago   OR   Or contact neurology for in person visit with them to see if they can help coordinate this   This is easier to coordinate from the hospital instead of outpatient but if needed outpatient we have to have a in person visit  Due to him having covid he cant have office visit here for 21 days post covid diagnosis  If leg weakness worse go to ED and social work can help coordinate home services and he can be evaluated for leg weakness    There are no options for covid 19 testing where they come to him  He has to go to testing or set up his own test with [facility] [address] online and do his own test in the car but has to get far up in his nose to get a good sample    What would he like to do?   [place]

## 2019-09-12 NOTE — Telephone Encounter (Signed)
I called Dr. Manuella Ghazi @ [facility] neurology and a nurse will be calling me back.  Taseen Marasigan,cma

## 2019-09-13 NOTE — Telephone Encounter (Signed)
I called and spoke with the patient and informed him that I spoke with the nurse at the office of Dr. Manuella Ghazi the neurologist and they will be coordinating the home health nurse.  I informed him that they will call me or he could call them.  Synai Prettyman,cma

## 2019-09-13 NOTE — Telephone Encounter (Signed)
Patient called about Home Health nurse that he will be needing once wife returns to work [id number]. Please advise

## 2019-09-16 NOTE — Telephone Encounter (Signed)
Pt called and wants to know where the referral for the electric wheelchair stands.

## 2019-09-17 ENCOUNTER — Telehealth: Payer: Self-pay | Admitting: Family Medicine

## 2019-09-17 DIAGNOSIS — Z7984 Long term (current) use of oral hypoglycemic drugs: Secondary | ICD-10-CM | POA: Diagnosis not present

## 2019-09-17 DIAGNOSIS — Z8619 Personal history of other infectious and parasitic diseases: Secondary | ICD-10-CM | POA: Diagnosis not present

## 2019-09-17 DIAGNOSIS — E785 Hyperlipidemia, unspecified: Secondary | ICD-10-CM | POA: Diagnosis not present

## 2019-09-17 DIAGNOSIS — E119 Type 2 diabetes mellitus without complications: Secondary | ICD-10-CM | POA: Diagnosis not present

## 2019-09-17 DIAGNOSIS — G6181 Chronic inflammatory demyelinating polyneuritis: Secondary | ICD-10-CM | POA: Diagnosis not present

## 2019-09-17 DIAGNOSIS — I10 Essential (primary) hypertension: Secondary | ICD-10-CM | POA: Diagnosis not present

## 2019-09-17 NOTE — Telephone Encounter (Signed)
Called and patient stated they already have the bracelet.  Soriyah Osberg,cma

## 2019-09-17 NOTE — Telephone Encounter (Signed)
Pt called in to follow up, pt says that he was told by the office that they were going to assist him with getting a referral for a wheelchair. Pt says that he reached out to the location that he was advised and they made him aware that they have not received a referral or information about a wheelchair, pt would like further assistance.    CB: [phone]

## 2019-09-17 NOTE — Telephone Encounter (Signed)
I called the company [employer] where I faxed the order for the electric wheelchair and the patient's information could not be found, I gave the customer service rep the fax number I sent it to and she stated they were not using that fax number anymore and she gave me a new fax number and the DME and patient demographics and insurance information was faxed to [facility] today, I notified the patient of this error and he understood.  Yulanda Diggs,cma

## 2019-09-26 NOTE — Telephone Encounter (Signed)
Will await this fax.

## 2019-09-26 NOTE — Telephone Encounter (Signed)
Copied from [place] [id number] [phone]. Topic: General - Other >> Sep 26, 2019 10:35 AM Celene Kras wrote: Reason for CRM: Mardene Celeste, from [facility], called and is needing more information regarding the prescription sent over for pts power wheelchair. Please advise.   [phone]

## 2019-09-30 ENCOUNTER — Telehealth: Payer: Self-pay | Admitting: Family Medicine

## 2019-09-30 DIAGNOSIS — F321 Major depressive disorder, single episode, moderate: Secondary | ICD-10-CM

## 2019-09-30 DIAGNOSIS — M62569 Muscle wasting and atrophy, not elsewhere classified, unspecified lower leg: Secondary | ICD-10-CM

## 2019-09-30 DIAGNOSIS — G6181 Chronic inflammatory demyelinating polyneuritis: Secondary | ICD-10-CM

## 2019-09-30 NOTE — Telephone Encounter (Signed)
Patient called in to see if office received request for home health aid / hosp bed . Please advise

## 2019-09-30 NOTE — Telephone Encounter (Signed)
Have we received a fax requesting this?

## 2019-09-30 NOTE — Telephone Encounter (Signed)
Patient requesting hospital bed due to [facility] PT recommendation, to assist with patient getting in and out of bed, please advise

## 2019-10-01 ENCOUNTER — Telehealth: Payer: Self-pay

## 2019-10-01 NOTE — Telephone Encounter (Signed)
I called patient per Juliann Pulse & let him know that she was working on DME order for hospital bed. I let him know that due to printer issues we were running behind. Patient thanked me for the update & I told him I would let him know when anything else was heard.

## 2019-10-01 NOTE — Telephone Encounter (Signed)
Mrs.Rogers called in and checking on status of getting information needed for power wheelchair. Please advise and fax back paperwork to [phone], attention: Verdie Drown.

## 2019-10-01 NOTE — Telephone Encounter (Signed)
I will forward to Juliann Pulse to see if she can place the order for the DME hospital bed. If you could check and see if Dr Nicki Reaper would be willing to sign this order while I am out that would be great. I am unable to place him in a rehab facility. Placement in rehab typically comes after someone is hospitalized. He should contact his neurologist to get their input on the next step with his PT as they may know of options for more extensive PT.

## 2019-10-01 NOTE — Telephone Encounter (Signed)
I called and informed Michael Montgomery of [facility] that we received the form for the wheelchair and the provider is out right now but we will take care of this as soon as he returns.  [name],cma

## 2019-10-03 ENCOUNTER — Telehealth: Payer: Self-pay | Admitting: Family Medicine

## 2019-10-03 DIAGNOSIS — Z20828 Contact with and (suspected) exposure to other viral communicable diseases: Secondary | ICD-10-CM | POA: Diagnosis not present

## 2019-10-03 NOTE — Telephone Encounter (Signed)
Patient can get these OTC. Gae Bon noted she informed him.

## 2019-10-03 NOTE — Telephone Encounter (Signed)
My printer fixed today was just able to print gave copy to Dr. Nicki Reaper for signature. Will give to Gae Bon once signed.

## 2019-10-03 NOTE — Telephone Encounter (Signed)
Patient is requesting prescription for gloves. Please advise. Preferred [phone]

## 2019-10-03 NOTE — Telephone Encounter (Signed)
Signed and gave to [place]

## 2019-10-03 NOTE — Telephone Encounter (Signed)
Patient is requesting prescription for gloves. Please advise. Preferred [place]

## 2019-10-03 NOTE — Telephone Encounter (Signed)
Patient is calling to state that [facility] does not do hospital beds. Can the order be submitted to [facility] for the hospital bed?  [phone]

## 2019-10-03 NOTE — Telephone Encounter (Signed)
DME was signed by Dr. Nicki Reaper and I faxed it to [place] today with confirmation.  Camdan Burdi,cma

## 2019-10-04 ENCOUNTER — Telehealth: Payer: Self-pay

## 2019-10-04 NOTE — Telephone Encounter (Signed)
I sent all the paperwork (DME,insurance) to [facility] for a hospital bed per patient request.  Lateisha Thurlow,cma

## 2019-10-04 NOTE — Telephone Encounter (Signed)
Patient is calling back stating that [facility] does not have hospital beds either. Patient is fine with the order going where ever [facility] sends the Order. Please advise [phone]

## 2019-10-08 ENCOUNTER — Telehealth: Payer: Self-pay | Admitting: Family Medicine

## 2019-10-08 NOTE — Telephone Encounter (Signed)
Pt called about wanting to get a hoyer lift due to having some break down on rear side from laying in the bed due to not being able to walk at this time. Please advise and Thank you!  Call pt @ [phone]

## 2019-10-08 NOTE — Telephone Encounter (Signed)
Pt called to check on the order for the hospital bed and when I told him it was faxed to [facility] he said they do not have beds.  He asked that the order be sent to [facility].  He said we should have their fax number.  He states that [employer] called [place] and gave [place] all the information.  Pt's CB#  [phone]

## 2019-10-10 ENCOUNTER — Encounter: Payer: Self-pay | Admitting: Intensive Care

## 2019-10-10 ENCOUNTER — Emergency Department
Admission: EM | Admit: 2019-10-10 | Discharge: 2019-10-15 | Disposition: A | Discharge status: Skilled Nursing Facility | Payer: Medicare Other | Attending: Emergency Medicine | Admitting: Emergency Medicine

## 2019-10-10 ENCOUNTER — Telehealth: Payer: Self-pay | Admitting: Family Medicine

## 2019-10-10 ENCOUNTER — Emergency Department: Payer: Medicare Other

## 2019-10-10 ENCOUNTER — Other Ambulatory Visit: Payer: Self-pay

## 2019-10-10 DIAGNOSIS — R531 Weakness: Secondary | ICD-10-CM | POA: Diagnosis present

## 2019-10-10 DIAGNOSIS — Z659 Problem related to unspecified psychosocial circumstances: Secondary | ICD-10-CM

## 2019-10-10 DIAGNOSIS — I10 Essential (primary) hypertension: Secondary | ICD-10-CM | POA: Insufficient documentation

## 2019-10-10 DIAGNOSIS — E1144 Type 2 diabetes mellitus with diabetic amyotrophy: Secondary | ICD-10-CM | POA: Insufficient documentation

## 2019-10-10 DIAGNOSIS — G629 Polyneuropathy, unspecified: Secondary | ICD-10-CM

## 2019-10-10 DIAGNOSIS — Z609 Problem related to social environment, unspecified: Secondary | ICD-10-CM | POA: Diagnosis not present

## 2019-10-10 DIAGNOSIS — G6181 Chronic inflammatory demyelinating polyneuritis: Secondary | ICD-10-CM | POA: Insufficient documentation

## 2019-10-10 DIAGNOSIS — Z7984 Long term (current) use of oral hypoglycemic drugs: Secondary | ICD-10-CM | POA: Diagnosis not present

## 2019-10-10 DIAGNOSIS — Z79899 Other long term (current) drug therapy: Secondary | ICD-10-CM | POA: Insufficient documentation

## 2019-10-10 DIAGNOSIS — Z8619 Personal history of other infectious and parasitic diseases: Secondary | ICD-10-CM | POA: Diagnosis not present

## 2019-10-10 DIAGNOSIS — Z87891 Personal history of nicotine dependence: Secondary | ICD-10-CM | POA: Diagnosis not present

## 2019-10-10 DIAGNOSIS — G589 Mononeuropathy, unspecified: Secondary | ICD-10-CM | POA: Diagnosis not present

## 2019-10-10 DIAGNOSIS — R262 Difficulty in walking, not elsewhere classified: Secondary | ICD-10-CM | POA: Insufficient documentation

## 2019-10-10 HISTORY — DX: Polyneuropathy, unspecified: G62.9

## 2019-10-10 LAB — CBC
HCT: 42.9 % (ref 39.0–52.0)
Hemoglobin: 14.6 g/dL (ref 13.0–17.0)
MCH: 29 pg (ref 26.0–34.0)
MCHC: 34 g/dL (ref 30.0–36.0)
MCV: 85.3 fL (ref 80.0–100.0)
Platelets: 323 10*3/uL (ref 150–400)
RBC: 5.03 MIL/uL (ref 4.22–5.81)
RDW: 13.2 % (ref 11.5–15.5)
WBC: 10.5 10*3/uL (ref 4.0–10.5)
nRBC: 0 % (ref 0.0–0.2)

## 2019-10-10 LAB — BASIC METABOLIC PANEL
Anion gap: 12 (ref 5–15)
BUN: 20 mg/dL (ref 6–20)
CO2: 24 mmol/L (ref 22–32)
Calcium: 9.5 mg/dL (ref 8.9–10.3)
Chloride: 99 mmol/L (ref 98–111)
Creatinine, Ser: 0.34 mg/dL — ABNORMAL LOW (ref 0.61–1.24)
GFR calc Af Amer: >60 mL/min (ref >60)
GFR calc non Af Amer: >60 mL/min (ref >60)
Glucose, Bld: 182 mg/dL — ABNORMAL HIGH (ref 70–99)
Potassium: 4.5 mmol/L (ref 3.5–5.1)
Sodium: 135 mmol/L (ref 135–145)

## 2019-10-10 MED ORDER — ONDANSETRON 4 MG PO TBDP
ORAL_TABLET | ORAL | Status: AC
  Filled 2019-10-10: qty 1

## 2019-10-10 MED ORDER — ONDANSETRON 4 MG PO TBDP
4.0000 mg | ORAL_TABLET | Freq: Once | ORAL | Status: AC | PRN
  Administered 2019-10-10: 18:00:00 4 mg via ORAL

## 2019-10-10 NOTE — Telephone Encounter (Signed)
Noted.  Thank you for contacting him.

## 2019-10-10 NOTE — Telephone Encounter (Signed)
Noted  

## 2019-10-10 NOTE — Telephone Encounter (Signed)
[facility] called. She is concerned about patient being at home by his self. Patient is not getting the care at home that he is needed, wife is working. Please call Kathlee Nations at [phone], for further discussion on issues.

## 2019-10-10 NOTE — Telephone Encounter (Signed)
Noted. Please make sure he has follow-up with his neurologist. With the things he is having issues with he needs to have close follow-up with them. Thanks.

## 2019-10-10 NOTE — Telephone Encounter (Signed)
It sounds like he needs a skilled nursing facility though if he is not able to walk he needs to go to the emergency department for evaluation.  They would also be able to evaluate the skin tears.  If he truly is unable to walk then they would likely be able to help facilitate placement quicker than we can.  If he is willing to go to the ED he should go.  If he is not willing to go to the ED I will also forward to [facility] to see if we can do anything to facilitate placement.

## 2019-10-10 NOTE — ED Triage Notes (Signed)
Pt in via EMS from home. Pt was dx'd with COVID on 11/4. EMS reports pt had neuropathy prior to [place] but since [place] he has not been able to walk. EMS reports PT has been coming to his home but pt is not able to ambulate or care for himself.

## 2019-10-10 NOTE — Telephone Encounter (Signed)
I called the patient and he has a virtual visit with Neurologist on next Tuesday, he stated the provider wanted to check out his medications and see what he could do for him.  Nadalie Laughner,cma

## 2019-10-10 NOTE — ED Provider Notes (Signed)
[facility] Emergency Department Provider Note   ____________________________________________   First MD Initiated Contact with Patient 10/10/19 2309     (approximate)  I have reviewed the triage vital signs and the nursing notes.   HISTORY  Chief Complaint Weakness and Peripheral Neuropathy    HPI Michael Montgomery is a 58 y.o. male brought to the ED from home via EMS with a chief complaint of "rehab placement".  Patient has a history of peripheral neuropathy and reports he has not been able to ambulate x6 weeks.  Has physical therapy come out to the house.  Wife works so patient is alone for long periods of time.  Physical therapist found patient in his own feces today.  Message to his PCP who referred him to the ED because he felt that placement would be quicker through the ED then through his office.  Patient tested positive for Covid on 11/3.  States his only symptoms were nasal congestion and last of taste and smell.  Currently denies fever, cough, chest pain, shortness of breath, abdominal pain, nausea, vomiting, diarrhea.       Past Medical History:  Diagnosis Date  . Cataract march, april   bilateral removed   . Chickenpox   . Diabetes mellitus without complication (HCC)    metformin   . Diabetic amyotrophy [facility])    sees dr at [facility]  . Gastric ulcer   . Heart murmur    When he was younger, no recent issues  . High blood pressure    controlled with meds  . Hypercholesteremia    controlled with medication  . Knee injury    left  . Motion sickness    boats  . Neuropathy     Patient Active Problem List   Diagnosis Date Noted  . Back strain 07/16/2019  . DOE (dyspnea on exertion) 07/12/2019  . Fall 07/12/2019  . Elevated LFTs 02/08/2019  . Rotator cuff impingement syndrome of left shoulder 02/19/2018  . Depression, major, single episode, complete remission ([place]) 10/20/2017  . Hypogonadism in male 08/09/2017  . Attention deficit  02/14/2017  . Constipation 01/18/2017  . Mood swings 09/07/2016  . Allergic rhinitis 09/07/2016  . Poor dentition 09/07/2016  . Hyperlipidemia 05/24/2016  . Sleeping difficulty 04/21/2016  . CIDP (chronic inflammatory demyelinating polyneuropathy) ([place]) 04/11/2016  . Erectile dysfunction of organic origin 03/17/2016  . Phimosis 03/17/2016  . BPH with obstruction/lower urinary tract symptoms 03/17/2016  . Gingivitis 03/01/2016  . Diabetes ([place]) 02/07/2016  . Erectile dysfunction 02/07/2016  . Muscle wasting and atrophy, not elsewhere classified, unspecified lower leg 01/21/2016  . Left leg weakness 01/21/2016  . Essential hypertension 01/19/2016  . Cataracts, bilateral 01/19/2016  . Left knee pain 01/19/2016    Past Surgical History:  Procedure Laterality Date  . CATARACT EXTRACTION W/PHACO Right 02/01/2016   Procedure: CATARACT EXTRACTION PHACO AND INTRAOCULAR LENS PLACEMENT ([place]) right;  Surgeon: Ronnell Freshwater, MD;  Location: [facility];  Service: Ophthalmology;  Laterality: Right;  DIABETIC - oral meds  . CATARACT EXTRACTION W/PHACO Left 03/07/2016   Procedure: CATARACT EXTRACTION PHACO AND INTRAOCULAR LENS PLACEMENT ([facility]);  Surgeon: Ronnell Freshwater, MD;  Location: [facility];  Service: Ophthalmology;  Laterality: Left;  DIABETIC - oral meds   . circumscision      Prior to Admission medications   Medication Sig Start Date End Date Taking? Authorizing Provider  albuterol (VENTOLIN HFA) 108 (90 Base) MCG/ACT inhaler Inhale 2-4 puffs by mouth every  4 hours as needed for wheezing, cough, and/or shortness of breath 08/26/19   Hinda Kehr, MD  amLODipine (NORVASC) 10 MG tablet TAKE 1 TABLET BY MOUTH EVERY DAY 06/18/19   Leone Haven, MD  atorvastatin (LIPITOR) 40 MG tablet TAKE 1 TABLET BY MOUTH DAILY 09/03/19   Leone Haven, MD  clomiPHENE (CLOMID) 50 MG tablet Take 1/2 tablet daily 04/19/18   Zara Council A, PA-C  escitalopram  (LEXAPRO) 20 MG tablet Take 20 mg by mouth daily. 09/16/19   [provider]  fluticasone (FLONASE) 50 MCG/ACT nasal spray Place 2 sprays into both nostrils daily. 01/31/19   Leone Haven, MD  gabapentin (NEURONTIN) 300 MG capsule Take 1 capsule by mouth 2 (two) times daily. Take 1 cap in AM and 2 caps at bedtime 09/13/16   [provider]  GAMUNEX-C 20 GM/200ML SOLN  01/02/19   [provider]  hydrochlorothiazide (HYDRODIURIL) 12.5 MG tablet Take 1 tablet (12.5 mg total) by mouth daily. 03/25/19   Leone Haven, MD  linaclotide Rolan Lipa) 72 MCG capsule Take 1 capsule (72 mcg total) by mouth daily before breakfast. 02/12/19   Leone Haven, MD  loratadine (CLARITIN) 10 MG tablet TAKE 1 TABLET EVERY DAY 09/03/19   Leone Haven, MD  losartan (COZAAR) 100 MG tablet TAKE 1 TABLET BY MOUTH EVERY DAY 07/16/19   Leone Haven, MD  metFORMIN (GLUCOPHAGE) 500 MG tablet TAKE 2 TABLETS IN MORNING AND 1 TABLET AT NIGHT 03/27/19   Leone Haven, MD  Multiple Vitamin (MULTIVITAMIN) capsule Take 1 capsule by mouth daily.    [provider]  mycophenolate (CELLCEPT) 500 MG tablet Take 2 tablets by mouth 2 (two) times daily. 08/23/19   [provider]  Omega-3 Fatty Acids (FISH OIL PO) Take 1 capsule by mouth daily.     [provider]  ondansetron (ZOFRAN ODT) 4 MG disintegrating tablet Allow 1-2 tablets to dissolve in your mouth every 8 hours as needed for nausea/vomiting 08/26/19   Hinda Kehr, MD  sildenafil (VIAGRA) 50 MG tablet TAKE 1 TABLET (50 MG TOTAL) BY MOUTH DAILY AS NEEDED FOR ERECTILE DYSFUNCTION. 02/12/16   [provider]    Allergies Patient has no known allergies.  Family History  Problem Relation Age of Onset  . Alcoholism Father   . Hyperlipidemia Father   . Hypertension Father   . Throat cancer Father        smoked but had stopped 20 yeras before dx  . Diabetes Mother   . Emphysema Mother   . Kidney  disease Neg Hx   . Prostate cancer Neg Hx   . Colon cancer Neg Hx   . Colon polyps Neg Hx   . Esophageal cancer Neg Hx   . Rectal cancer Neg Hx   . Stomach cancer Neg Hx   . Kidney cancer Neg Hx   . Bladder Cancer Neg Hx     Social History Social History   Tobacco Use  . Smoking status: Former Smoker    Types: Cigars  . Smokeless tobacco: Never Used  . Tobacco comment: one cigar once a month to once every 2 months   Substance Use Topics  . Alcohol use: Yes    Alcohol/week: 5.0 standard drinks    Types: 5 Shots of liquor per week    Comment: pt stated " every once in a while"  . Drug use: No    Review of Systems  Constitutional: No fever/chills Eyes:  No visual changes. ENT: No sore throat. Cardiovascular: Denies chest pain. Respiratory: Denies shortness of breath. Gastrointestinal: No abdominal pain.  No nausea, no vomiting.  No diarrhea.  No constipation. Genitourinary: Negative for dysuria. Musculoskeletal: Negative for back pain. Skin: Negative for rash. Neurological: Positive for peripheral neuropathy.  Negative for headaches, focal weakness or numbness.   ____________________________________________   PHYSICAL EXAM:  VITAL SIGNS: ED Triage Vitals  Enc Vitals Group     BP 10/10/19 1738 (!) 148/79     Pulse Rate 10/10/19 1738 (!) 109     Resp 10/10/19 2237 16     Temp 10/10/19 1738 98.7 F (37.1 C)     Temp Source 10/10/19 1738 Oral     SpO2 10/10/19 1738 99 %     Weight 10/10/19 1744 187 lb (84.8 kg)     Height 10/10/19 1744 5\' 11"  (1.803 m)     Head Circumference --      Peak Flow --      Pain Score 10/10/19 1744 8     Pain Loc --      Pain Edu? --      Excl. in [place]? --     Constitutional: Alert and oriented. Well appearing and in no acute distress. Eyes: Conjunctivae are normal. PERRL. EOMI. Head: Atraumatic. Nose: No congestion/rhinnorhea. Mouth/Throat: Mucous membranes are moist.  Oropharynx non-erythematous. Neck: No stridor.     Cardiovascular: Normal rate, regular rhythm. Grossly normal heart sounds.  Good peripheral circulation. Respiratory: Normal respiratory effort.  No retractions. Lungs CTAB. Gastrointestinal: Soft and nontender. No distention. No abdominal bruits. No CVA tenderness. Musculoskeletal: No lower extremity tenderness nor edema.  No joint effusions. Neurologic:  Normal speech and language. No gross focal neurologic deficits are appreciated.  Diminished sensation bilateral feet. Skin:  Skin is warm, dry and intact. No rash noted. Psychiatric: Mood and affect are normal. Speech and behavior are normal.  ____________________________________________   LABS (all labs ordered are listed, but only abnormal results are displayed)  Labs Reviewed  BASIC METABOLIC PANEL - Abnormal; Notable for the following components:      Result Value   Glucose, Bld 182 (*)    Creatinine, Ser 0.34 (*)    All other components within normal limits  CBC  URINALYSIS, COMPLETE (UACMP) WITH MICROSCOPIC  TROPONIN I (HIGH SENSITIVITY)   ____________________________________________  EKG  ED ECG REPORT I, Ariyah Sedlack J, the attending physician, personally viewed and interpreted this ECG.   Date: 10/11/2019  EKG Time: 1746  Rate: 105  Rhythm: sinus tachycardia  Axis: Normal  Intervals:none  ST&T Change: Nonspecific  ____________________________________________  RADIOLOGY  ED MD interpretation: No acute cardiopulmonary process  Official radiology report(s): DG Chest Port 1 View  Result Date: 10/10/2019 CLINICAL DATA:  COVID positive. EXAM: PORTABLE CHEST 1 VIEW COMPARISON:  August 26, 2019 FINDINGS: The heart size and mediastinal contours are within normal limits. Both lungs are clear. The visualized skeletal structures are unremarkable. IMPRESSION: No active disease. Electronically Signed   By: Constance Holster M.D.   On: 10/10/2019 23:57     ____________________________________________   PROCEDURES  Procedure(s) performed (including Critical Care):  Procedures   ____________________________________________   INITIAL IMPRESSION / ASSESSMENT AND PLAN / ED COURSE  As part of my medical decision making, I reviewed the following data within the [place] notes reviewed and incorporated, Labs reviewed, EKG interpreted, Old chart reviewed, Radiograph reviewed and Notes from prior ED visits     Alysia Penna Lacorte was  evaluated in Emergency Department on 10/11/2019 for the symptoms described in the history of present illness. He was evaluated in the context of the global COVID-19 pandemic, which necessitated consideration that the patient might be at risk for infection with the SARS-CoV-2 virus that causes COVID-19. Institutional protocols and algorithms that pertain to the evaluation of patients at risk for COVID-19 are in a state of rapid change based on information released by regulatory bodies including the CDC and federal and state organizations. These policies and algorithms were followed during the patient's care in the ED.    58 year old male with peripheral neuropathy, and inability to ambulate for the past 6 weeks who was sent by his PCP for rehab/placement.  I did prepare the patient for the reality that he may be in the emergency department for several days pending placement.  He is accepting of this.  We will ask pharmacy tech to reconcile patient's medications so that I may enter them in the computer.      ____________________________________________   FINAL CLINICAL IMPRESSION(S) / ED DIAGNOSES  Final diagnoses:  Neuropathy  Unable to ambulate  Poor social situation     ED Discharge Orders    None       Note:  This document was prepared using Dragon voice recognition software and may include unintentional dictation errors.   Paulette Blanch, MD 10/11/19 [phone]

## 2019-10-10 NOTE — ED Triage Notes (Signed)
Patient sent by PCP for possible rehab placement. C/o weakness and being unable to ambulate. HX neuropathy Patient reports he has not been able to walk for a month and a half. Patient says he has a bedside commode at home and his wife has to physically lift him up and put him on it.

## 2019-10-10 NOTE — Telephone Encounter (Signed)
Advised patient per MD note , he is going to call EMS for transport due to he says he cannot walk at all he cannot stand to walk to a car .

## 2019-10-10 NOTE — ED Notes (Signed)
Blood drawn by seneka paramedic student

## 2019-10-10 NOTE — Telephone Encounter (Signed)
Dr. Caryl Bis I received a call from Michael Montgomery from Michael Montgomery and she stated she went to the home of Michael Montgomery and Michael Montgomery was sitting in feces and urine and Michael Montgomery had some type of rope tied to the bed to help him get out of the bed. She stated Michael Montgomery cannot walk at all. Michael Montgomery had a skin tear on hi buttocks and a place on his toe. She thinks Michael Montgomery needs to be in a physical Montgomery facility, she stated his wife is working and there is no one to take care of him, I informed her that I had just spoke with the patient and Michael Montgomery stated Michael Montgomery was fine and his wife wanted all these things. I don't know what's going on but she is also going back to his home later today as well.  But she states she is concerned.  She left her number [phone].  Please advise.  Aaniya Sterba,cma

## 2019-10-10 NOTE — Telephone Encounter (Signed)
I called the patient and I informed him that he has requested a hoyer lift and the provider stated at this point he would need a visit because of the things he is requesting.  The patient stated that he does not want all those things, he is fine, it is his wife.  He states all he wants is the hospital bed because of his legs, he does not want a hoyer lift. I scheduled him for a f/up in January with the provider and he understood.  Lunabelle Oatley,cma

## 2019-10-11 DIAGNOSIS — G589 Mononeuropathy, unspecified: Secondary | ICD-10-CM | POA: Diagnosis not present

## 2019-10-11 LAB — TROPONIN I (HIGH SENSITIVITY): Troponin I (High Sensitivity): 14 ng/L (ref <18)

## 2019-10-11 MED ORDER — METFORMIN HCL 500 MG PO TABS
500.0000 mg | ORAL_TABLET | Freq: Two times a day (BID) | ORAL | Status: DC
  Administered 2019-10-11 – 2019-10-15 (×9): 500 mg via ORAL
  Filled 2019-10-11 (×9): qty 1

## 2019-10-11 MED ORDER — MYCOPHENOLATE MOFETIL 250 MG PO CAPS
1000.0000 mg | ORAL_CAPSULE | Freq: Two times a day (BID) | ORAL | Status: DC
  Administered 2019-10-11 – 2019-10-15 (×8): 1000 mg via ORAL
  Filled 2019-10-11 (×11): qty 4

## 2019-10-11 MED ORDER — LINACLOTIDE 72 MCG PO CAPS
72.0000 ug | ORAL_CAPSULE | Freq: Every day | ORAL | Status: DC
  Filled 2019-10-11 (×5): qty 1

## 2019-10-11 MED ORDER — AMLODIPINE BESYLATE 5 MG PO TABS
10.0000 mg | ORAL_TABLET | Freq: Every day | ORAL | Status: DC
  Administered 2019-10-11 – 2019-10-15 (×5): 10 mg via ORAL
  Filled 2019-10-11 (×5): qty 2

## 2019-10-11 MED ORDER — ATORVASTATIN CALCIUM 20 MG PO TABS
40.0000 mg | ORAL_TABLET | Freq: Every day | ORAL | Status: DC
  Administered 2019-10-11 – 2019-10-14 (×4): 40 mg via ORAL
  Filled 2019-10-11 (×4): qty 2

## 2019-10-11 MED ORDER — ONDANSETRON 4 MG PO TBDP: 4.0000 mg | ORAL_TABLET | Freq: Three times a day (TID) | ORAL | Status: DC | PRN

## 2019-10-11 MED ORDER — ESCITALOPRAM OXALATE 10 MG PO TABS
20.0000 mg | ORAL_TABLET | Freq: Every day | ORAL | Status: DC
  Administered 2019-10-11 – 2019-10-15 (×5): 20 mg via ORAL
  Filled 2019-10-11 (×5): qty 2

## 2019-10-11 MED ORDER — LOSARTAN POTASSIUM 50 MG PO TABS
100.0000 mg | ORAL_TABLET | Freq: Every day | ORAL | Status: DC
  Administered 2019-10-11 – 2019-10-15 (×5): 100 mg via ORAL
  Filled 2019-10-11 (×5): qty 2

## 2019-10-11 MED ORDER — GABAPENTIN 300 MG PO CAPS
300.0000 mg | ORAL_CAPSULE | Freq: Two times a day (BID) | ORAL | Status: DC
  Administered 2019-10-11 – 2019-10-15 (×9): 300 mg via ORAL
  Filled 2019-10-11 (×9): qty 1

## 2019-10-11 MED ORDER — HYDROCHLOROTHIAZIDE 25 MG PO TABS
12.5000 mg | ORAL_TABLET | Freq: Every day | ORAL | Status: DC
  Administered 2019-10-11 – 2019-10-15 (×5): 12.5 mg via ORAL
  Filled 2019-10-11 (×5): qty 1

## 2019-10-11 NOTE — ED Notes (Signed)
PT in with pt.

## 2019-10-11 NOTE — ED Notes (Signed)
Pt provided with dinner and water by this RN

## 2019-10-11 NOTE — Progress Notes (Signed)
Rehab Admissions Coordinator Note:  Per PT recommendation, patient was screened by Michel Santee for appropriateness for an Inpatient Acute Rehab Consult.  Per TOC note, goal for pt placement as he is not safe at home alone during the day while wife works.  As pt is not admitted to the hospital, he is not a candidate for CIR.   Michel Santee, PT, DPT 10/11/2019, 1:16 PM  I can be reached at [phone].

## 2019-10-11 NOTE — ED Notes (Signed)
States he had some weakness in legs in Oct  Was dx'd with COVID and since his weakness is worse  Was bale to use a cane until this week

## 2019-10-11 NOTE — ED Notes (Signed)
Received report from night shift  Pt is awake and resting  Denies any complaints at present

## 2019-10-11 NOTE — ED Notes (Signed)
Pt repositioned by this RN.  

## 2019-10-11 NOTE — ED Notes (Signed)
Pt given diet ginger ale by this RN,per pt's request.

## 2019-10-11 NOTE — Evaluation (Signed)
Physical Therapy Evaluation Patient Details Name: Michael Montgomery MRN: [id number] DOB: Jul 29, 1961 Today's Date: 10/11/2019   History of Present Illness  58 y.o. male brought to the ED from home via EMS with subacute profound LE weakness.  Patient has a history of peripheral neuropathy but reports he has not been able to ambulate x6 weeks.  Has physical therapy come out to the house.  Wife works so patient is alone for long periods of time.  Message to his PCP who referred him to the ED because he felt that pt needed placement.   Patient tested positive for Covid on 11/3, with minimal associated acute symptoms.    Clinical Impression  Pt pleasant and eager to work with PT however he is very functionally limited recently.  He had a positive Covid test 11/3 after having some LE weakness (has chronic neuropathy issues/weakness) but acutely and subacutely he has been much weaker and in the last few weeks has not been able to stand/walk.  Prior to November he was able to get around as much as needed with [facility].  He is profoundly weak in b/l LEs (less so in UEs) with the L being weaker than R.  Pt needed a lot of assist to get to sitting and could not even make an attempt at standing secondary to weakness.  He was able to participate with light LE exercises but needed AAROM for any again gravity motions and had limited strength in limited ROM with other acts.  Pt excited about the idea of intense rehab and was very open to the idea of a more involved rehab setting, PT requesting inpatient rehab to address acute weakness and mobility deficits.    Follow Up Recommendations CIR    Equipment Recommendations  Other (comment)(TBD at rehab)    Recommendations for Other Services Rehab consult     Precautions / Restrictions Precautions Precautions: Fall Restrictions Weight Bearing Restrictions: No      Mobility  Bed Mobility Overal bed mobility: Needs Assistance Bed Mobility: Supine to Sit;Sit to  Supine     Supine to sit: Mod assist Sit to supine: Mod assist   General bed mobility comments: Pt showed very good effort in trying to use UEs to assist to sitting, ultimatley needed considerable assist and extra time to attain EOB sitting  Transfers                 General transfer comment: Pt, frankly, could not even attempt to get to sitting secondary to weakness.  Struggled to get knees bent past 90* and reports that over the last few weeks he has tried but been unable to get to standing with HHPT.  Pt spotted to block feet to try to allow some EOB scooting, but pt could not manage even minimal side scooting  Ambulation/Gait             General Gait Details: unable, has been unable to walk in weeks  Stairs            Wheelchair Mobility    Modified Rankin (Stroke Patients Only)       Balance Overall balance assessment: Needs assistance Sitting-balance support: Bilateral upper extremity supported Sitting balance-Leahy Scale: Fair Sitting balance - Comments: pt using b/l UEs to maintain sitting balance, feet not solidly taking weight on ground  Pertinent Vitals/Pain Pain Assessment: (minimal sacral pain, no nerve pain, etc)    Home Living Family/patient expects to be discharged to:: Inpatient rehab                 Additional Comments: Pt lives with wife (who works out of the home), 6 steps to enter home.    Prior Function Level of Independence: Independent with assistive device(s)         Comments: Pt has been using SPC since 2017 but was indepdent and mobile until weakness (associated with Covid?) became more and more of an issue in the last 6 weeks.  Currently he has been bed bound for ~1 month.     Hand Dominance        Extremity/Trunk Assessment   Upper Extremity Assessment Upper Extremity Assessment: Generalized weakness(lacks overhead elevation in b/l shlds, R grossly 3+/5, L  3-)    Lower Extremity Assessment Lower Extremity Assessment: Generalized weakness(R LE grossly 2/5, L grossly 2-/5)       Communication   Communication: No difficulties  Cognition Arousal/Alertness: Awake/alert Behavior During Therapy: WFL for tasks assessed/performed Overall Cognitive Status: Within Functional Limits for tasks assessed                                 General Comments: Pt very pleasant, motivated and eager to do what he can      General Comments      Exercises     Assessment/Plan    PT Assessment Patient needs continued PT services  PT Problem List Decreased strength;Decreased range of motion;Decreased activity tolerance;Decreased balance;Decreased coordination;Decreased mobility;Decreased knowledge of use of DME;Decreased safety awareness       PT Treatment Interventions DME instruction;Gait training;Functional mobility training;Therapeutic activities;Therapeutic exercise;Balance training;Neuromuscular re-education;Patient/family education    PT Goals (Current goals can be found in the Care Plan section)  Acute Rehab PT Goals Patient Stated Goal: get stronger at rehab and back to walking PT Goal Formulation: With patient Time For Goal Achievement: 10/25/19 Potential to Achieve Goals: Fair    Frequency Min 2X/week   Barriers to discharge        Co-evaluation               AM-PAC PT "6 Clicks" Mobility  Outcome Measure Help needed turning from your back to your side while in a flat bed without using bedrails?: A Lot Help needed moving from lying on your back to sitting on the side of a flat bed without using bedrails?: A Lot Help needed moving to and from a bed to a chair (including a wheelchair)?: Total Help needed standing up from a chair using your arms (e.g., wheelchair or bedside chair)?: Total Help needed to walk in hospital room?: Total Help needed climbing 3-5 steps with a railing? : Total 6 Click Score: 8    End  of Session   Activity Tolerance: Patient limited by fatigue;Patient tolerated treatment well Patient left: with bed alarm set;with call bell/phone within reach Nurse Communication: Mobility status(possible need for neuro consult) PT Visit Diagnosis: Muscle weakness (generalized) (M62.81);Difficulty in walking, not elsewhere classified (R26.2);Other symptoms and signs involving the nervous system (R29.898)    Time: [id number] PT Time Calculation (min) (ACUTE ONLY): 43 min   Charges:   PT Evaluation $PT Eval Low Complexity: 1 Low PT Treatments $Therapeutic Activity: 8-22 mins        Kreg Shropshire, DPT 10/11/2019, 12:16 PM

## 2019-10-11 NOTE — Progress Notes (Signed)
Attempted to speak with pt about meds, pt states he does not know. Attempted to call spouse twice with no answer, left HIPPA appropriate message for wife to call me back. Pts medication list needs to be verified.

## 2019-10-11 NOTE — NC FL2 (Signed)
[place] LEVEL OF CARE SCREENING TOOL     IDENTIFICATION  Patient Name: Michael Montgomery Birthdate: 22-Jan-1961 Sex: male Admission Date (Current Location): 10/10/2019  [facility] and [place] Number:  Engineering geologist and Address:         Provider Number:    Attending Physician Name and Address:  No att. providers found  Relative Name and Phone Number:       Current Level of Care: Hospital Recommended Level of Care: [place] Prior Approval Number:    Date Approved/Denied:   PASRR Number: [id number]  Discharge Plan: SNF    Current Diagnoses: Patient Active Problem List   Diagnosis Date Noted  . Back strain 07/16/2019  . DOE (dyspnea on exertion) 07/12/2019  . Fall 07/12/2019  . Elevated LFTs 02/08/2019  . Rotator cuff impingement syndrome of left shoulder 02/19/2018  . Depression, major, single episode, complete remission ([place]) 10/20/2017  . Hypogonadism in male 08/09/2017  . Attention deficit 02/14/2017  . Constipation 01/18/2017  . Mood swings 09/07/2016  . Allergic rhinitis 09/07/2016  . Poor dentition 09/07/2016  . Hyperlipidemia 05/24/2016  . Sleeping difficulty 04/21/2016  . CIDP (chronic inflammatory demyelinating polyneuropathy) ([place]) 04/11/2016  . Erectile dysfunction of organic origin 03/17/2016  . Phimosis 03/17/2016  . BPH with obstruction/lower urinary tract symptoms 03/17/2016  . Gingivitis 03/01/2016  . Diabetes ([place]) 02/07/2016  . Erectile dysfunction 02/07/2016  . Muscle wasting and atrophy, not elsewhere classified, unspecified lower leg 01/21/2016  . Left leg weakness 01/21/2016  . Essential hypertension 01/19/2016  . Cataracts, bilateral 01/19/2016  . Left knee pain 01/19/2016    Orientation RESPIRATION BLADDER Height & Weight     Self, Time, Situation, Place  Normal Continent Weight: 84.8 kg Height:  5\' 11"  (180.3 cm)  BEHAVIORAL SYMPTOMS/MOOD NEUROLOGICAL BOWEL NUTRITION STATUS      Continent  Diet  AMBULATORY STATUS COMMUNICATION OF NEEDS Skin   Total Care Verbally Normal                       Personal Care Assistance Level of Assistance  Bathing, Dressing Bathing Assistance: Maximum assistance   Dressing Assistance: Maximum assistance     Functional Limitations Info             SPECIAL CARE FACTORS FREQUENCY  PT (By licensed PT), OT (By licensed OT)     PT Frequency: min 5xweekly OT Frequency: min 5xweekly            Contractures      Additional Factors Info                  Current Medications (10/11/2019):  This is the current hospital active medication list Current Facility-Administered Medications  Medication Dose Route Frequency Provider Last Rate Last Admin  . amLODipine (NORVASC) tablet 10 mg  10 mg Oral Daily Merlyn Lot, MD   10 mg at 10/11/19 1016  . atorvastatin (LIPITOR) tablet 40 mg  40 mg Oral q1800 Merlyn Lot, MD      . escitalopram [facility]) tablet 20 mg  20 mg Oral Daily Merlyn Lot, MD   20 mg at 10/11/19 1016  . gabapentin (NEURONTIN) capsule 300 mg  300 mg Oral BID Merlyn Lot, MD   300 mg at 10/11/19 1017  . hydrochlorothiazide (HYDRODIURIL) tablet 12.5 mg  12.5 mg Oral Daily Merlyn Lot, MD   12.5 mg at 10/11/19 1017  . linaclotide (LINZESS) capsule 72 mcg  72 mcg Oral  QAC breakfast Merlyn Lot, MD      . losartan (COZAAR) tablet 100 mg  100 mg Oral Daily Merlyn Lot, MD   100 mg at 10/11/19 1017  . metFORMIN (GLUCOPHAGE) tablet 500-1,000 mg  500-1,000 mg Oral BID WC Merlyn Lot, MD   500 mg at 10/11/19 0929  . mycophenolate (CELLCEPT) capsule 1,000 mg  1,000 mg Oral BID Merlyn Lot, MD   1,000 mg at 10/11/19 1016  . ondansetron (ZOFRAN-ODT) disintegrating tablet 4-8 mg  4-8 mg Oral Q8H PRN Merlyn Lot, MD       Current Outpatient Medications  Medication Sig Dispense Refill  . amLODipine (NORVASC) 10 MG tablet TAKE 1 TABLET BY MOUTH EVERY DAY (Patient taking  differently: Take 10 mg by mouth daily. ) 30 tablet 0  . escitalopram (LEXAPRO) 20 MG tablet Take 20 mg by mouth daily.    . fluticasone (FLONASE) 50 MCG/ACT nasal spray Place 2 sprays into both nostrils daily. 16 g 6  . gabapentin (NEURONTIN) 300 MG capsule Take 1 capsule by mouth 2 (two) times daily. Take 1 cap in AM and 2 caps at bedtime    . hydrochlorothiazide (HYDRODIURIL) 12.5 MG tablet Take 1 tablet (12.5 mg total) by mouth daily. 90 tablet 1  . linaclotide (LINZESS) 72 MCG capsule Take 1 capsule (72 mcg total) by mouth daily before breakfast. 30 capsule 1  . loratadine (CLARITIN) 10 MG tablet TAKE 1 TABLET EVERY DAY (Patient taking differently: Take 10 mg by mouth daily. ) 30 tablet 2  . losartan (COZAAR) 100 MG tablet TAKE 1 TABLET BY MOUTH EVERY DAY (Patient taking differently: Take 100 mg by mouth daily. ) 90 tablet 3  . metFORMIN (GLUCOPHAGE) 500 MG tablet TAKE 2 TABLETS IN MORNING AND 1 TABLET AT NIGHT (Patient taking differently: Take 500-1,000 mg by mouth 2 (two) times daily with a meal. TAKE 2 TABLETS IN MORNING AND 1 TABLET AT NIGHT) 180 tablet 5  . Multiple Vitamin (MULTIVITAMIN) capsule Take 1 capsule by mouth daily.    . mycophenolate (CELLCEPT) 500 MG tablet Take 2 tablets by mouth 2 (two) times daily.    . Omega-3 Fatty Acids (FISH OIL PO) Take 1 capsule by mouth daily.     Marland Kitchen albuterol (VENTOLIN HFA) 108 (90 Base) MCG/ACT inhaler Inhale 2-4 puffs by mouth every 4 hours as needed for wheezing, cough, and/or shortness of breath 8 g 1  . atorvastatin (LIPITOR) 40 MG tablet TAKE 1 TABLET BY MOUTH DAILY (Patient taking differently: Take 40 mg by mouth daily at 6 PM. ) 30 tablet 1  . clomiPHENE (CLOMID) 50 MG tablet Take 1/2 tablet daily (Patient not taking: Reported on 10/11/2019) 30 tablet 3  . GAMUNEX-C 20 GM/200ML SOLN     . ondansetron (ZOFRAN ODT) 4 MG disintegrating tablet Allow 1-2 tablets to dissolve in your mouth every 8 hours as needed for nausea/vomiting (Patient taking  differently: Take 4-8 mg by mouth every 8 (eight) hours as needed for nausea or vomiting. Allow 1-2 tablets to dissolve in your mouth every 8 hours as needed for nausea/vomiting) 30 tablet 0  . sildenafil (VIAGRA) 50 MG tablet Take 50 mg by mouth as needed for erectile dysfunction.        Discharge Medications: Please see discharge summary for a list of discharge medications.  Relevant Imaging Results:  Relevant Lab Results:   Additional Information [id number]  Anselm Pancoast, RN

## 2019-10-11 NOTE — TOC Initial Note (Signed)
Transition of Care [facility]) - Initial/Assessment Note    Patient Details  Name: Michael Montgomery MRN: [id number] Date of Birth: 11-21-1960  Transition of Care [facility]) CM/SW Contact:    Anselm Pancoast, RN Phone Number: 10/11/2019, 11:55 AM  Clinical Narrative:                 58 year old male admitted from home after failing outpatient treatment with home health care. Patient began developing increased weakness in lower extremities 3 months ago and has been working with home health PT. Wife was able to take some time off work however she has had to return to work and patient is home alone during the day. Patient is not able to get up and go to bathroom or prepare meals during the day and PT advised patient to come to ED for SNF placement to work with more intensive therapy.   Expected Discharge Plan: [place]     Patient Goals and CMS Choice Patient states their goals for this hospitalization and ongoing recovery are:: get to rehab and get strong enough to return home      Expected Discharge Plan and Services Expected Discharge Plan: [place] arrangements for the past 2 months: Single Family Home                                      Prior Living Arrangements/Services Living arrangements for the past 2 months: Single Family Home Lives with:: Spouse Patient language and need for interpreter reviewed:: Yes Do you feel safe going back to the place where you live?: No   wife works and patient is home alone-unable to provide basic care for himself at home  Need for Family Participation in Patient Care: Yes (Comment) Care giver support system in place?: Yes (comment) Current home services: DME(Cane, walker, BSC) Criminal Activity/Legal Involvement Pertinent to Current Situation/Hospitalization: No - Comment as needed  Activities of Daily Living      Permission Sought/Granted Permission sought to share information with : Facility  Art therapist granted to share information with : Yes, Verbal Permission Granted              Emotional Assessment Appearance:: Appears older than stated age Attitude/Demeanor/Rapport: Engaged Affect (typically observed): Accepting   Alcohol / Substance Use: Never Used Psych Involvement: No (comment)  Admission diagnosis:  weakness Patient Active Problem List   Diagnosis Date Noted  . Back strain 07/16/2019  . DOE (dyspnea on exertion) 07/12/2019  . Fall 07/12/2019  . Elevated LFTs 02/08/2019  . Rotator cuff impingement syndrome of left shoulder 02/19/2018  . Depression, major, single episode, complete remission ([place]) 10/20/2017  . Hypogonadism in male 08/09/2017  . Attention deficit 02/14/2017  . Constipation 01/18/2017  . Mood swings 09/07/2016  . Allergic rhinitis 09/07/2016  . Poor dentition 09/07/2016  . Hyperlipidemia 05/24/2016  . Sleeping difficulty 04/21/2016  . CIDP (chronic inflammatory demyelinating polyneuropathy) ([place]) 04/11/2016  . Erectile dysfunction of organic origin 03/17/2016  . Phimosis 03/17/2016  . BPH with obstruction/lower urinary tract symptoms 03/17/2016  . Gingivitis 03/01/2016  . Diabetes ([place]) 02/07/2016  . Erectile dysfunction 02/07/2016  . Muscle wasting and atrophy, not elsewhere classified, unspecified lower leg 01/21/2016  . Left leg weakness 01/21/2016  . Essential hypertension 01/19/2016  . Cataracts, bilateral 01/19/2016  . Left knee pain 01/19/2016   PCP:  Leone Haven, MD Pharmacy:   [id number] [place], [place] [address] [address] Phone: [phone] Fax: [phone]  [id number] [place] [place] - [address] AT [facility] [address] [place] [address] Phone: [phone] Fax: [phone]     Social Determinants of Health (SDOH) Interventions    Readmission Risk Interventions No  flowsheet data found.

## 2019-10-11 NOTE — ED Notes (Signed)
Patient moved over to hospital bed

## 2019-10-11 NOTE — Care Management (Addendum)
RN CM: Bed offer made by [place] agreed. RN talked with Claiborne Billings in admissions who states she will request 3 day waiver and clarify admission approval due to COVID positive in November. Will proceed as appropriate if accepted.   RN CM: Wife called and states she does not want patient to go to [facility] and is really wanting patient to go somewhere else. RN CM notified Claiborne Billings @ [facility] of change and will wait for more bed offers to arrive.

## 2019-10-12 DIAGNOSIS — G589 Mononeuropathy, unspecified: Secondary | ICD-10-CM | POA: Diagnosis not present

## 2019-10-12 LAB — URINALYSIS, COMPLETE (UACMP) WITH MICROSCOPIC
Bacteria, UA: NONE SEEN
Bilirubin Urine: NEGATIVE
Glucose, UA: NEGATIVE mg/dL
Ketones, ur: NEGATIVE mg/dL
Leukocytes,Ua: NEGATIVE
Nitrite: NEGATIVE
Protein, ur: 30 mg/dL — AB
Specific Gravity, Urine: 1.021 (ref 1.005–1.030)
Squamous Epithelial / HPF: NONE SEEN (ref 0–5)
pH: 5 (ref 5.0–8.0)

## 2019-10-12 NOTE — ED Notes (Signed)
Assisted pt with the bedpan

## 2019-10-12 NOTE — ED Notes (Signed)
Report given to [place] RN.

## 2019-10-12 NOTE — ED Notes (Addendum)
Report received from [place], RN.

## 2019-10-12 NOTE — ED Notes (Signed)
Report received - pt to be admitted to a snf. Had a bed at [facility] house but pt declined so sw is starting over.

## 2019-10-12 NOTE — ED Notes (Signed)
Pt resting with eyes closed, resps even and non labored

## 2019-10-13 DIAGNOSIS — G589 Mononeuropathy, unspecified: Secondary | ICD-10-CM | POA: Diagnosis not present

## 2019-10-13 LAB — GLUCOSE, CAPILLARY: Glucose-Capillary: 145 mg/dL — ABNORMAL HIGH (ref 70–99)

## 2019-10-13 MED ORDER — ZOLPIDEM TARTRATE 5 MG PO TABS
5.0000 mg | ORAL_TABLET | Freq: Every evening | ORAL | Status: DC | PRN
  Administered 2019-10-13 – 2019-10-14 (×2): 5 mg via ORAL
  Filled 2019-10-13 (×2): qty 1

## 2019-10-13 NOTE — ED Notes (Signed)
Pt given meal tray and ginger ale to drink

## 2019-10-13 NOTE — ED Notes (Signed)
Pt given meal tray.

## 2019-10-13 NOTE — Social Work (Addendum)
Treatment of Care social worker made contact with the patient by telephone. Updated patient on SNF bed offers: [facility] in [place] made bed offer. Patient noted that he has to talk to his wife before accepting offer.Pt also added that he wants to go to a facility outside of [place].  1648.   Bed search expanded to the [facility] area, [place], per patient's request.    Berenice Bouton, MSW, LCSW  [phone] 8am-6pm (weekends) or CSW ED # [phone]

## 2019-10-13 NOTE — ED Provider Notes (Signed)
-----------------------------------------   11:11 PM on 10/13/2019 -----------------------------------------   Blood pressure 124/71, pulse 90, temperature 98.9 F (37.2 C), temperature source Oral, resp. rate 20, height 5\' 11"  (1.803 m), weight 84.8 kg, SpO2 94 %.  The patient is calm and cooperative at this time.  There have been no acute events since the last update.  Awaiting disposition plan from Behavioral Medicine and/or Social Work team(s).    Nena Polio, MD 10/13/19 2312

## 2019-10-13 NOTE — ED Notes (Signed)
Pt resting in bed at this time. Currently denies any needs. Pt is in NAD

## 2019-10-14 DIAGNOSIS — G589 Mononeuropathy, unspecified: Secondary | ICD-10-CM | POA: Diagnosis not present

## 2019-10-14 NOTE — ED Notes (Signed)
This Rn to bedside at this time. Pt requesting Ambien to help him sleep. This RN explained it was 2030 and patient had meds due approx 10pm. Pt states understanding with waiting until 10pm med administration to receive ambien at this time. Pt visualized in NAD otherwise. Will continue to monitor for further patient needs.

## 2019-10-14 NOTE — ED Notes (Signed)
Meds administered per order, pt given meal tray at this time.

## 2019-10-14 NOTE — ED Notes (Signed)
This RN to bedside at this time, pt eating lunch tray, pt provided with a diet coke per his request with lunch tray. Pt visualized in NAD at this time. Urinal emptied by this RN.

## 2019-10-14 NOTE — ED Notes (Signed)
Pt resting in bed watching TV. NAD noted at this time. Pt denies further needs. Pt repositioned himself in bed. Pt in possession of personal cell phone at this time. Pt states understanding to hit call bell for further needs.

## 2019-10-14 NOTE — ED Notes (Signed)
Pt given night time meds and a diet coke per his request, denies further needs, visualized resting in bed eating chips and watching TV. NAD noted. VSS.

## 2019-10-14 NOTE — ED Notes (Signed)
Pt given his cell phone back at this time. Denies further needs. Call bell remains within reach. Pt remains A&O x4. Explained will bring lunch tray when they arrive. Pt states understanding, states will use call bell if further needs arise.

## 2019-10-14 NOTE — ED Notes (Signed)
Pt repositioned in bed at this time, denies any needs. Call bell remains within reach. Pt requests all lights be dimmed for his comfort. VSS.

## 2019-10-14 NOTE — Care Management (Addendum)
RN CM: Discussed 2 bed offers with patient and need to choose one. Patient requested CM contact his wife and have her decide.   RN contacted wife, Geni Bers and reviewed 2 bed offers in depth as well as goals for rehab. Wife advised to go ahead and start working on admission to [facility] and she would review it online. Wife concerned that patient was supposed to be getting measured for an electric wheelchair. RN CM advised that patient was sitting in the emergency department waiting for transfer to SNF and not receiving inpatient services. Wife wanted to know how patient would be transported to [facility] confirmed EMS.   RN CM outreach to [place] @ [facility] to confirm patient accepting bed at [facility]. Awaiting callback from [place].   RN CM: Confirmed with Olivia Mackie @ SNF-no bed available until tomorrow. Wife and patient updated. Wife states she will drop his belongings off at [facility] tomorrow after he arrives.

## 2019-10-14 NOTE — ED Provider Notes (Signed)
Patient will continue to wait for final plan through social work team.  Currently working on placement options.  No events during shift.  Ongoing care signed to Dr. Cherylann Banas  Vitals:   10/14/19 1300 10/14/19 1400  BP: (!) 146/88 131/73  Pulse: 93 92  Resp: 17 20  Temp:    SpO2: 100% 98%      Michael Kitten, MD 10/14/19 [phone]

## 2019-10-14 NOTE — Care Management (Signed)
RN CM: Multiple SNF offers arrived and reviewed with patient and spouse. Patient states he has chosen [facility]. RN CM advised patient should be able to transfer tomorrow. No further questions or concerns.

## 2019-10-14 NOTE — ED Notes (Signed)
This RN to bedside at this time, pt resting in hospital bed watching TV at this time. Pt A&O x 4, given deodorant per his request. Pt cell phone placed on charger at registration desk on side A per his request. Pt denies any needs at this time. Pt asking about Education officer, museum, this RN explained social worker working on patients and rounding. Pt states understanding.

## 2019-10-14 NOTE — ED Notes (Signed)
Pt requests this RN turn lights off. Pt visualized in NAD, denies further needs. Pt able to reposition himself in bed without difficulty. Will continue to monitor.

## 2019-10-15 DIAGNOSIS — G589 Mononeuropathy, unspecified: Secondary | ICD-10-CM | POA: Diagnosis not present

## 2019-10-15 MED ORDER — PREDNISONE 10 MG PO TABS: ORAL_TABLET | ORAL | 0 refills | Status: DC

## 2019-10-15 MED ORDER — PREDNISONE 20 MG PO TABS
60.0000 mg | ORAL_TABLET | Freq: Once | ORAL | Status: AC
  Administered 2019-10-15: 60 mg via ORAL
  Filled 2019-10-15: qty 3

## 2019-10-15 NOTE — ED Notes (Signed)
Pt taken off bed pan, new brief placed.  Given breakfast tray.  Watching TV at this time. Supposed to go to [facility] today.

## 2019-10-15 NOTE — ED Notes (Signed)
Pt on  Phone at bedside updating family and watching TV

## 2019-10-15 NOTE — ED Notes (Signed)
[facility]  CALLED  FOR  TRANSPORT 

## 2019-10-15 NOTE — Care Management (Signed)
RN CM: Incoming call from [place] @ [facility] requesting clarification regarding Gamunex injections. RN CM called wife and confirmed that patient gets injections every other month and will be due again in February. Injections are mailed to the home address. Wife states she will bring to facility if remains there at due date.

## 2019-10-15 NOTE — ED Provider Notes (Signed)
Dr. Manuella Ghazi called, had a televisit with the patient today and would like [place] to start the patient on steroid taper.  I have prescribed as requested by Dr. Brigitte Pulse a taper over the next 10 days.  Discussed with patient, he is in agreement, will start first dose today and provide prescription for him to have filled while at his care facility  Dr. Manuella Ghazi also reports he will follow up with the patient within the next several days to reassess how the patient is doing with regard to signs symptoms and treatment   Delman Kitten, MD 10/15/19 1301

## 2019-10-15 NOTE — TOC Progression Note (Signed)
Transition of Care [facility]) - Progression Note    Patient Details  Name: DAROL COLA MRN: [id number] Date of Birth: 08/19/1961  Transition of Care [facility]) CM/SW Contact  Anselm Pancoast, RN Phone Number: 10/15/2019, 10:37 AM  Clinical Narrative:    Confirmed with Olivia Mackie @ [facility] patient is cleared to transfer today. MD confirmed patient has resolved from [place] and is not on isolation precautions. Will notify nurse.   Expected Discharge Plan: [place]    Expected Discharge Plan and Services Expected Discharge Plan: [place] arrangements for the past 2 months: Single Family Home                                       Social Determinants of Health (SDOH) Interventions    Readmission Risk Interventions No flowsheet data found.

## 2019-10-15 NOTE — ED Provider Notes (Signed)
The patient is far greater than 20 days from date of COVID-19 diagnosis.  He is not having active symptoms of COVID-19. he is no longer considered infectious.   Delman Kitten, MD 10/15/19 1029

## 2019-10-15 NOTE — Telephone Encounter (Signed)
Error.  Aldena Worm,cma  

## 2019-10-15 NOTE — ED Provider Notes (Signed)
Patient has a bed secured at [facility].  EMS has been called for transport.  Patient alert, hemodynamically stable.  No acute distress.  Comfortable understanding plan for discharge  Patient reports no concerns, and he is understanding and agreeable with plan to go to [facility].  Reports he actually just got off of a FaceTime conversation with his neurologist Dr. Brigitte Pulse as well   Delman Kitten, MD 10/15/19 1141

## 2019-10-15 NOTE — ED Notes (Signed)
Pt asleep in bed. Resting, NAD.

## 2019-10-15 NOTE — ED Notes (Signed)
Pt resting in no acute distress, VSS.

## 2019-10-15 NOTE — TOC Transition Note (Signed)
Transition of Care [facility]) - CM/SW Discharge Note   Patient Details  Name: Michael Montgomery MRN: [id number] Date of Birth: Jun 25, 1961  Transition of Care [facility]) CM/SW Contact:  Anselm Pancoast, RN Phone Number: 10/15/2019, 11:16 AM   Clinical Narrative:     Patient will discharge to [facility] today, 10/15/19.        Patient Goals and CMS Choice Patient states their goals for this hospitalization and ongoing recovery are:: get to rehab and get strong enough to return home      Discharge Placement                       Discharge Plan and Services                                     Social Determinants of Health (SDOH) Interventions     Readmission Risk Interventions No flowsheet data found.

## 2019-10-16 NOTE — Telephone Encounter (Signed)
Pt called and stated that [facility] is now needing office notes in order for him to get a bed

## 2019-10-16 NOTE — Telephone Encounter (Signed)
I do not think he would need a hospital bed order there though he might. I would suggest calling him back to check and if needed we would then have to call [facility] to find out how to get him a hospital bed. Thanks.

## 2019-10-16 NOTE — Telephone Encounter (Signed)
This patient called today stating he needed office notes in order for him to get the hospital bed but, when I look in the chart the patient is staying at [facility]. I will not call him back, please advise.  Levina Boyack,cma

## 2019-10-19 ENCOUNTER — Other Ambulatory Visit: Payer: Self-pay | Admitting: Family Medicine

## 2019-10-21 ENCOUNTER — Telehealth: Payer: Self-pay | Admitting: Family Medicine

## 2019-10-21 NOTE — Telephone Encounter (Signed)
Pt called to let us know that [facility] was waiting on the paperwork to be sent over  Please contact them at [phone]

## 2019-10-22 NOTE — Telephone Encounter (Signed)
I called the patient about the hospital bed and he is in a facility and we will wait to see if the bed is needed after he gets out of the facility.  Kinney Sackmann,cma

## 2019-10-22 NOTE — Telephone Encounter (Signed)
I called and spoke with the patient and asked if he still needed a hospital bed and he stated he is at present at the [facility] and he does not know if he would need the hospital bed later when he goes home, I informed him to wait until he sees his progress and to give [place] a call when he is going home to see if he still needs the hospital bed, the patient agreed to this and understood.  Oskar Cretella,cma

## 2019-10-25 DIAGNOSIS — R262 Difficulty in walking, not elsewhere classified: Secondary | ICD-10-CM | POA: Diagnosis not present

## 2019-10-25 DIAGNOSIS — M6281 Muscle weakness (generalized): Secondary | ICD-10-CM | POA: Diagnosis not present

## 2019-10-25 DIAGNOSIS — R29898 Other symptoms and signs involving the musculoskeletal system: Secondary | ICD-10-CM | POA: Diagnosis not present

## 2019-10-25 DIAGNOSIS — Z79899 Other long term (current) drug therapy: Secondary | ICD-10-CM | POA: Diagnosis not present

## 2019-10-25 DIAGNOSIS — Z23 Encounter for immunization: Secondary | ICD-10-CM | POA: Diagnosis not present

## 2019-10-25 DIAGNOSIS — F330 Major depressive disorder, recurrent, mild: Secondary | ICD-10-CM | POA: Diagnosis not present

## 2019-10-25 DIAGNOSIS — E119 Type 2 diabetes mellitus without complications: Secondary | ICD-10-CM | POA: Diagnosis not present

## 2019-10-25 DIAGNOSIS — G9009 Other idiopathic peripheral autonomic neuropathy: Secondary | ICD-10-CM | POA: Diagnosis not present

## 2019-10-25 DIAGNOSIS — I10 Essential (primary) hypertension: Secondary | ICD-10-CM | POA: Diagnosis not present

## 2019-10-25 DIAGNOSIS — G6181 Chronic inflammatory demyelinating polyneuritis: Secondary | ICD-10-CM | POA: Diagnosis not present

## 2019-10-25 DIAGNOSIS — E46 Unspecified protein-calorie malnutrition: Secondary | ICD-10-CM | POA: Diagnosis not present

## 2019-10-25 DIAGNOSIS — L97519 Non-pressure chronic ulcer of other part of right foot with unspecified severity: Secondary | ICD-10-CM | POA: Diagnosis not present

## 2019-10-25 DIAGNOSIS — R488 Other symbolic dysfunctions: Secondary | ICD-10-CM | POA: Diagnosis not present

## 2019-10-25 DIAGNOSIS — R7989 Other specified abnormal findings of blood chemistry: Secondary | ICD-10-CM | POA: Diagnosis not present

## 2019-10-25 DIAGNOSIS — M6250 Muscle wasting and atrophy, not elsewhere classified, unspecified site: Secondary | ICD-10-CM | POA: Diagnosis not present

## 2019-10-25 DIAGNOSIS — G933 Postviral fatigue syndrome: Secondary | ICD-10-CM | POA: Diagnosis not present

## 2019-10-25 DIAGNOSIS — E782 Mixed hyperlipidemia: Secondary | ICD-10-CM | POA: Diagnosis not present

## 2019-10-25 DIAGNOSIS — M6259 Muscle wasting and atrophy, not elsewhere classified, multiple sites: Secondary | ICD-10-CM | POA: Diagnosis not present

## 2019-10-25 DIAGNOSIS — E785 Hyperlipidemia, unspecified: Secondary | ICD-10-CM | POA: Diagnosis not present

## 2019-10-25 DIAGNOSIS — D649 Anemia, unspecified: Secondary | ICD-10-CM | POA: Diagnosis not present

## 2019-10-25 DIAGNOSIS — G4700 Insomnia, unspecified: Secondary | ICD-10-CM | POA: Diagnosis not present

## 2019-10-25 DIAGNOSIS — U071 COVID-19: Secondary | ICD-10-CM | POA: Diagnosis not present

## 2019-10-25 DIAGNOSIS — E1144 Type 2 diabetes mellitus with diabetic amyotrophy: Secondary | ICD-10-CM | POA: Diagnosis not present

## 2019-10-30 ENCOUNTER — Other Ambulatory Visit: Payer: Self-pay | Admitting: Family Medicine

## 2019-11-04 DIAGNOSIS — G933 Postviral fatigue syndrome: Secondary | ICD-10-CM | POA: Diagnosis not present

## 2019-11-04 DIAGNOSIS — G6181 Chronic inflammatory demyelinating polyneuritis: Secondary | ICD-10-CM | POA: Diagnosis not present

## 2019-11-12 ENCOUNTER — Other Ambulatory Visit: Payer: Self-pay

## 2019-11-12 ENCOUNTER — Ambulatory Visit (INDEPENDENT_AMBULATORY_CARE_PROVIDER_SITE_OTHER): Payer: Medicare Other | Admitting: Family Medicine

## 2019-11-12 ENCOUNTER — Encounter: Payer: Self-pay | Admitting: Family Medicine

## 2019-11-12 DIAGNOSIS — I10 Essential (primary) hypertension: Secondary | ICD-10-CM

## 2019-11-12 DIAGNOSIS — G6181 Chronic inflammatory demyelinating polyneuritis: Secondary | ICD-10-CM

## 2019-11-12 DIAGNOSIS — R7989 Other specified abnormal findings of blood chemistry: Secondary | ICD-10-CM | POA: Diagnosis not present

## 2019-11-12 DIAGNOSIS — E119 Type 2 diabetes mellitus without complications: Secondary | ICD-10-CM | POA: Diagnosis not present

## 2019-11-12 NOTE — Progress Notes (Signed)
Virtual Visit via video Note  This visit type was conducted due to national recommendations for restrictions regarding the COVID-19 pandemic (e.g. social distancing).  This format is felt to be most appropriate for this patient at this time.  All issues noted in this document were discussed and addressed.  No physical exam was performed (except for noted visual exam findings with Video Visits).   I connected with Michael Montgomery today at 10:30 AM EST by a video enabled telemedicine application and verified that I am speaking with the correct person using two identifiers. Location patient: Michael Montgomery Location provider: work Persons participating in the virtual visit: patient, provider  I discussed the limitations, risks, security and privacy concerns of performing an evaluation and management service by telephone and the availability of in person appointments. I also discussed with the patient that there may be a patient responsible charge related to this service. The patient expressed understanding and agreed to proceed.   Reason for visit: Follow-up.  HPI: Diabetes: Typically 109-110.  Taking metformin.  No polyuria or polydipsia.  Due for A1c.  Hypertension: Typically 125-130/70-80.  Taking losartan.  No chest pain or shortness of breath.  Minimal chronic edema that is not as bad as it has been in the past.  CIDP: Patient notes he had progression of his symptoms back in September.  He started to decline and eventually ended up being evaluated in the emergency room for placement.  He has been at [facility] for rehab and notes he is progressing well.  He has put on about 12 pounds of muscle.  He is doing physical therapy and is now mostly able to stand on his own for several minutes.  He is currently on CellCept and IVIG.  Continues to follow with neurology.  Elevated LFTs: Likely related to prior alcohol use.  He did have a biopsy that revealed healing injury and steatosis.  He is no  longer drinking alcohol.   ROS: See pertinent positives and negatives per HPI.  Past Medical History:  Diagnosis Date  . Cataract march, april   bilateral removed   . Chickenpox   . Diabetes mellitus without complication (HCC)    metformin   . 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 Gastric ulcer   . Heart murmur    When he was younger, no recent issues  . High blood pressure    controlled with meds  . Hypercholesteremia    controlled with medication  . Knee injury    left  . Motion sickness    boats  . Neuropathy     Past Surgical History:  Procedure Laterality Date  . CATARACT EXTRACTION W/PHACO Right 02/01/2016   Procedure: CATARACT EXTRACTION PHACO AND INTRAOCULAR LENS PLACEMENT ([place]) right;  Surgeon: Ronnell Freshwater, MD;  Location: [facility];  Service: Ophthalmology;  Laterality: Right;  DIABETIC - oral meds  . CATARACT EXTRACTION W/PHACO Left 03/07/2016   Procedure: CATARACT EXTRACTION PHACO AND INTRAOCULAR LENS PLACEMENT ([facility]);  Surgeon: Ronnell Freshwater, MD;  Location: [facility];  Service: Ophthalmology;  Laterality: Left;  DIABETIC - oral meds   . circumscision      Family History  Problem Relation Age of Onset  . Alcoholism Father   . Hyperlipidemia Father   . Hypertension Father   . Throat cancer Father        smoked but had stopped 20 yeras before dx  . Diabetes Mother   . Emphysema  Mother   . Kidney disease Neg Hx   . Prostate cancer Neg Hx   . Colon cancer Neg Hx   . Colon polyps Neg Hx   . Esophageal cancer Neg Hx   . Rectal cancer Neg Hx   . Stomach cancer Neg Hx   . Kidney cancer Neg Hx   . Bladder Cancer Neg Hx     SOCIAL HX: Former smoker   Current Outpatient Medications:  .  albuterol (VENTOLIN HFA) 108 (90 Base) MCG/ACT inhaler, Inhale 2-4 puffs by mouth every 4 hours as needed for wheezing, cough, and/or shortness of breath, Disp: 8 g, Rfl: 1 .  amLODipine (NORVASC) 10 MG tablet, TAKE 1  TABLET BY MOUTH EVERY DAY, Disp: 30 tablet, Rfl: 0 .  atorvastatin (LIPITOR) 40 MG tablet, TAKE 1 TABLET BY MOUTH DAILY, Disp: 30 tablet, Rfl: 1 .  clomiPHENE (CLOMID) 50 MG tablet, Take 1/2 tablet daily, Disp: 30 tablet, Rfl: 3 .  escitalopram (LEXAPRO) 20 MG tablet, Take 20 mg by mouth daily., Disp: , Rfl:  .  fluticasone (FLONASE) 50 MCG/ACT nasal spray, Place 2 sprays into both nostrils daily., Disp: 16 g, Rfl: 6 .  gabapentin (NEURONTIN) 300 MG capsule, Take 1 capsule by mouth 2 (two) times daily. Take 1 cap in AM and 2 caps at bedtime, Disp: , Rfl:  .  GAMUNEX-C 20 GM/200ML SOLN, , Disp: , Rfl:  .  hydrochlorothiazide (HYDRODIURIL) 12.5 MG tablet, TAKE 1 TABLET BY MOUTH EVERY DAY, Disp: 90 tablet, Rfl: 1 .  linaclotide (LINZESS) 72 MCG capsule, Take 1 capsule (72 mcg total) by mouth daily before breakfast., Disp: 30 capsule, Rfl: 1 .  loratadine (CLARITIN) 10 MG tablet, TAKE 1 TABLET EVERY DAY (Patient taking differently: Take 10 mg by mouth daily. ), Disp: 30 tablet, Rfl: 2 .  losartan (COZAAR) 100 MG tablet, TAKE 1 TABLET BY MOUTH EVERY DAY (Patient taking differently: Take 100 mg by mouth daily. ), Disp: 90 tablet, Rfl: 3 .  metFORMIN (GLUCOPHAGE) 500 MG tablet, TAKE 2 TABLETS IN MORNING AND 1 TABLET AT NIGHT (Patient taking differently: Take 500-1,000 mg by mouth 2 (two) times daily with a meal. TAKE 2 TABLETS IN MORNING AND 1 TABLET AT NIGHT), Disp: 180 tablet, Rfl: 5 .  Multiple Vitamin (MULTIVITAMIN) capsule, Take 1 capsule by mouth daily., Disp: , Rfl:  .  Omega-3 Fatty Acids (FISH OIL PO), Take 1 capsule by mouth daily. , Disp: , Rfl:  .  ondansetron (ZOFRAN ODT) 4 MG disintegrating tablet, Allow 1-2 tablets to dissolve in your mouth every 8 hours as needed for nausea/vomiting (Patient taking differently: Take 4-8 mg by mouth every 8 (eight) hours as needed for nausea or vomiting. Allow 1-2 tablets to dissolve in your mouth every 8 hours as needed for nausea/vomiting), Disp: 30 tablet,  Rfl: 0 .  predniSONE (DELTASONE) 10 MG tablet, Take 50 mg daily on day 1 and 2 Take 40 mg daily on day 3 and 4 Take 30 mg daily on day 5 and 6 Take 20 mg daily on day 7 and 8 Take 10 mg daily on day 9 and 10, Disp: 30 tablet, Rfl: 0 .  sildenafil (VIAGRA) 50 MG tablet, Take 50 mg by mouth as needed for erectile dysfunction. , Disp: , Rfl:   EXAM:  VITALS per patient if applicable:  GENERAL: alert, oriented, appears well and in no acute distress  HEENT: atraumatic, conjunttiva clear, no obvious abnormalities on inspection of external nose and ears  NECK:  normal movements of the head and neck  LUNGS: on inspection no signs of respiratory distress, breathing rate appears normal, no obvious gross SOB, gasping or wheezing  CV: no obvious cyanosis  MS: moves all visible extremities without noticeable abnormality  PSYCH/NEURO: pleasant and cooperative, no obvious depression or anxiety, speech and thought processing grossly intact  ASSESSMENT AND PLAN:  Discussed the following assessment and plan:  Essential hypertension Well-controlled.  Continue current regimen.  Diabetes (Michael Montgomery) Seems to be well controlled.  Continue Metformin.  We will see if they can check an A1c at his facility.  CIDP (chronic inflammatory demyelinating polyneuropathy) (HCC) Seems to be improving after having had a decline in her function.  He will continue rehab and continue his medication through neurology.  Elevated LFTs Likely related to prior alcohol intake.  We will recheck his LFTs.   No orders of the defined types were placed in this encounter.   No orders of the defined types were placed in this encounter.    I discussed the assessment and treatment plan with the patient. The patient was provided an opportunity to ask questions and all were answered. The patient agreed with the plan and demonstrated an understanding of the instructions.   The patient was advised to call back or seek an in-person  evaluation if the symptoms worsen or if the condition fails to improve as anticipated.  Michael Rumps, MD

## 2019-11-12 NOTE — Progress Notes (Signed)
I called [facility] and they need a order faxed to [phone].  I will give to provider to sign and I will fax to the facility.  Taite Baldassari,cma

## 2019-11-12 NOTE — Assessment & Plan Note (Signed)
Seems to be well controlled.  Continue Metformin.  We will see if they can check an A1c at his facility.

## 2019-11-12 NOTE — Assessment & Plan Note (Signed)
Likely related to prior alcohol intake.  We will recheck his LFTs.

## 2019-11-12 NOTE — Assessment & Plan Note (Signed)
Seems to be improving after having had a decline in her function.  He will continue rehab and continue his medication through neurology.

## 2019-11-12 NOTE — Assessment & Plan Note (Signed)
Well-controlled.  Continue current regimen. 

## 2019-11-13 DIAGNOSIS — E1144 Type 2 diabetes mellitus with diabetic amyotrophy: Secondary | ICD-10-CM | POA: Diagnosis not present

## 2019-11-13 DIAGNOSIS — E785 Hyperlipidemia, unspecified: Secondary | ICD-10-CM | POA: Diagnosis not present

## 2019-11-13 DIAGNOSIS — I10 Essential (primary) hypertension: Secondary | ICD-10-CM | POA: Diagnosis not present

## 2019-11-13 DIAGNOSIS — U071 COVID-19: Secondary | ICD-10-CM | POA: Diagnosis not present

## 2019-11-20 DIAGNOSIS — E785 Hyperlipidemia, unspecified: Secondary | ICD-10-CM | POA: Diagnosis not present

## 2019-11-20 DIAGNOSIS — D649 Anemia, unspecified: Secondary | ICD-10-CM | POA: Diagnosis not present

## 2019-11-20 DIAGNOSIS — G6181 Chronic inflammatory demyelinating polyneuritis: Secondary | ICD-10-CM | POA: Diagnosis not present

## 2019-11-28 ENCOUNTER — Other Ambulatory Visit: Payer: Self-pay | Admitting: Family Medicine

## 2019-12-09 DIAGNOSIS — G4700 Insomnia, unspecified: Secondary | ICD-10-CM | POA: Diagnosis not present

## 2019-12-09 DIAGNOSIS — D649 Anemia, unspecified: Secondary | ICD-10-CM | POA: Diagnosis not present

## 2019-12-09 DIAGNOSIS — R29898 Other symptoms and signs involving the musculoskeletal system: Secondary | ICD-10-CM | POA: Diagnosis not present

## 2019-12-09 DIAGNOSIS — M6250 Muscle wasting and atrophy, not elsewhere classified, unspecified site: Secondary | ICD-10-CM | POA: Diagnosis not present

## 2019-12-11 ENCOUNTER — Ambulatory Visit: Payer: Medicare Other | Admitting: Podiatry

## 2019-12-28 ENCOUNTER — Other Ambulatory Visit: Payer: Self-pay | Admitting: Family Medicine

## 2020-01-03 DIAGNOSIS — I10 Essential (primary) hypertension: Secondary | ICD-10-CM | POA: Diagnosis not present

## 2020-01-03 DIAGNOSIS — E1144 Type 2 diabetes mellitus with diabetic amyotrophy: Secondary | ICD-10-CM | POA: Diagnosis not present

## 2020-01-03 DIAGNOSIS — E785 Hyperlipidemia, unspecified: Secondary | ICD-10-CM | POA: Diagnosis not present

## 2020-01-08 DIAGNOSIS — G6181 Chronic inflammatory demyelinating polyneuritis: Secondary | ICD-10-CM | POA: Diagnosis not present

## 2020-01-10 DIAGNOSIS — E785 Hyperlipidemia, unspecified: Secondary | ICD-10-CM | POA: Diagnosis not present

## 2020-01-10 DIAGNOSIS — R29898 Other symptoms and signs involving the musculoskeletal system: Secondary | ICD-10-CM | POA: Diagnosis not present

## 2020-01-10 DIAGNOSIS — I10 Essential (primary) hypertension: Secondary | ICD-10-CM | POA: Diagnosis not present

## 2020-01-10 DIAGNOSIS — G6181 Chronic inflammatory demyelinating polyneuritis: Secondary | ICD-10-CM | POA: Diagnosis not present

## 2020-01-14 ENCOUNTER — Telehealth: Payer: Self-pay | Admitting: Family Medicine

## 2020-01-14 DIAGNOSIS — G6181 Chronic inflammatory demyelinating polyneuritis: Secondary | ICD-10-CM

## 2020-01-14 NOTE — Telephone Encounter (Signed)
Pt would like to get an order for a power wheel chair. He said he still doesn't have enough strength in his arms to get over carpet at home. Please advise.

## 2020-01-15 DIAGNOSIS — Z87891 Personal history of nicotine dependence: Secondary | ICD-10-CM | POA: Diagnosis not present

## 2020-01-15 DIAGNOSIS — S39012D Strain of muscle, fascia and tendon of lower back, subsequent encounter: Secondary | ICD-10-CM | POA: Diagnosis not present

## 2020-01-15 DIAGNOSIS — Z8631 Personal history of diabetic foot ulcer: Secondary | ICD-10-CM | POA: Diagnosis not present

## 2020-01-15 DIAGNOSIS — Z8616 Personal history of COVID-19: Secondary | ICD-10-CM | POA: Diagnosis not present

## 2020-01-15 DIAGNOSIS — E785 Hyperlipidemia, unspecified: Secondary | ICD-10-CM | POA: Diagnosis not present

## 2020-01-15 DIAGNOSIS — F329 Major depressive disorder, single episode, unspecified: Secondary | ICD-10-CM | POA: Diagnosis not present

## 2020-01-15 DIAGNOSIS — Z9181 History of falling: Secondary | ICD-10-CM | POA: Diagnosis not present

## 2020-01-15 DIAGNOSIS — G6181 Chronic inflammatory demyelinating polyneuritis: Secondary | ICD-10-CM | POA: Diagnosis not present

## 2020-01-15 DIAGNOSIS — D649 Anemia, unspecified: Secondary | ICD-10-CM | POA: Diagnosis not present

## 2020-01-15 DIAGNOSIS — M6281 Muscle weakness (generalized): Secondary | ICD-10-CM | POA: Diagnosis not present

## 2020-01-15 DIAGNOSIS — E1144 Type 2 diabetes mellitus with diabetic amyotrophy: Secondary | ICD-10-CM | POA: Diagnosis not present

## 2020-01-15 DIAGNOSIS — E1140 Type 2 diabetes mellitus with diabetic neuropathy, unspecified: Secondary | ICD-10-CM | POA: Diagnosis not present

## 2020-01-15 DIAGNOSIS — Z7984 Long term (current) use of oral hypoglycemic drugs: Secondary | ICD-10-CM | POA: Diagnosis not present

## 2020-01-15 DIAGNOSIS — N400 Enlarged prostate without lower urinary tract symptoms: Secondary | ICD-10-CM | POA: Diagnosis not present

## 2020-01-15 DIAGNOSIS — I10 Essential (primary) hypertension: Secondary | ICD-10-CM | POA: Diagnosis not present

## 2020-01-15 NOTE — Telephone Encounter (Signed)
I called the patient and he stated to send the DME order to [facility], I faxed the order to [facility].  Servando Kyllonen,cma

## 2020-01-15 NOTE — Telephone Encounter (Signed)
DME order placed. Please find out where he wants this faxed. He may need to come in for an in person visit to complete an exam, though some of that will depend on insurance requirements.

## 2020-01-16 ENCOUNTER — Telehealth: Payer: Self-pay | Admitting: Family Medicine

## 2020-01-16 NOTE — Telephone Encounter (Signed)
Verbal order can be given.  

## 2020-01-16 NOTE — Telephone Encounter (Signed)
Anderson Malta [phone] with [facility] called needing [facility]  Verbal Order PT 2 times a week  For 6 weeks  1 time a week for 2 weeks  Delay the OT order til next week  Discontinuing  the order for a home health aid

## 2020-01-16 NOTE — Telephone Encounter (Signed)
I called and left verbal orders for this patient on Jennifer's VM.  Per Dr. Caryl Bis.  Ronniesha Seibold,cma

## 2020-01-16 NOTE — Telephone Encounter (Signed)
Anderson Malta [phone] with [facility] called needing [facility]   Verbal Order PT 2 times a week  For 6 weeks  1 time a week for 2 weeks   Delay the OT order til next week  Discontinuing  the order for a home health aid  Samit Sylve,cma

## 2020-01-17 ENCOUNTER — Telehealth: Payer: Self-pay | Admitting: Family Medicine

## 2020-01-17 ENCOUNTER — Telehealth: Payer: Self-pay

## 2020-01-17 DIAGNOSIS — M6281 Muscle weakness (generalized): Secondary | ICD-10-CM | POA: Diagnosis not present

## 2020-01-17 DIAGNOSIS — I10 Essential (primary) hypertension: Secondary | ICD-10-CM | POA: Diagnosis not present

## 2020-01-17 DIAGNOSIS — G6181 Chronic inflammatory demyelinating polyneuritis: Secondary | ICD-10-CM | POA: Diagnosis not present

## 2020-01-17 DIAGNOSIS — E1144 Type 2 diabetes mellitus with diabetic amyotrophy: Secondary | ICD-10-CM | POA: Diagnosis not present

## 2020-01-17 DIAGNOSIS — E1140 Type 2 diabetes mellitus with diabetic neuropathy, unspecified: Secondary | ICD-10-CM | POA: Diagnosis not present

## 2020-01-17 DIAGNOSIS — S39012D Strain of muscle, fascia and tendon of lower back, subsequent encounter: Secondary | ICD-10-CM | POA: Diagnosis not present

## 2020-01-17 NOTE — Telephone Encounter (Signed)
Pt wife called regarding pt Home health. Please call wife @ [phone]. Thank you!

## 2020-01-17 NOTE — Telephone Encounter (Signed)
A order from [facility] and rehab for [place] as signed  And faxed to [facility] on 01/17/2020 @ 2 pm. Confirmation given.  Fredis Malkiewicz,cma

## 2020-01-17 NOTE — Telephone Encounter (Signed)
Pt called and said that he was on Vitamin D in Rehab and wanted a rx called in

## 2020-01-20 NOTE — Telephone Encounter (Signed)
Wife called and she would like home health to help at home.  Chelbi Herber,cma

## 2020-01-20 NOTE — Telephone Encounter (Signed)
Home health ordered.

## 2020-01-20 NOTE — Addendum Note (Signed)
Addended by: Caryl Bis Juline Sanderford G on: 01/20/2020 12:27 PM   Modules accepted: Orders

## 2020-01-21 ENCOUNTER — Telehealth: Payer: Self-pay

## 2020-01-21 ENCOUNTER — Encounter: Payer: Self-pay | Admitting: Family Medicine

## 2020-01-21 ENCOUNTER — Ambulatory Visit (INDEPENDENT_AMBULATORY_CARE_PROVIDER_SITE_OTHER): Payer: Medicare Other | Admitting: Family Medicine

## 2020-01-21 ENCOUNTER — Other Ambulatory Visit: Payer: Self-pay

## 2020-01-21 DIAGNOSIS — E1144 Type 2 diabetes mellitus with diabetic amyotrophy: Secondary | ICD-10-CM | POA: Diagnosis not present

## 2020-01-21 DIAGNOSIS — G6181 Chronic inflammatory demyelinating polyneuritis: Secondary | ICD-10-CM | POA: Diagnosis not present

## 2020-01-21 DIAGNOSIS — I10 Essential (primary) hypertension: Secondary | ICD-10-CM

## 2020-01-21 DIAGNOSIS — Z8616 Personal history of covid-19: Secondary | ICD-10-CM

## 2020-01-21 DIAGNOSIS — E119 Type 2 diabetes mellitus without complications: Secondary | ICD-10-CM

## 2020-01-21 DIAGNOSIS — S39012D Strain of muscle, fascia and tendon of lower back, subsequent encounter: Secondary | ICD-10-CM | POA: Diagnosis not present

## 2020-01-21 DIAGNOSIS — Z87891 Personal history of nicotine dependence: Secondary | ICD-10-CM

## 2020-01-21 DIAGNOSIS — D649 Anemia, unspecified: Secondary | ICD-10-CM

## 2020-01-21 DIAGNOSIS — M6281 Muscle weakness (generalized): Secondary | ICD-10-CM

## 2020-01-21 DIAGNOSIS — N400 Enlarged prostate without lower urinary tract symptoms: Secondary | ICD-10-CM

## 2020-01-21 DIAGNOSIS — F329 Major depressive disorder, single episode, unspecified: Secondary | ICD-10-CM

## 2020-01-21 DIAGNOSIS — R197 Diarrhea, unspecified: Secondary | ICD-10-CM | POA: Diagnosis not present

## 2020-01-21 DIAGNOSIS — E785 Hyperlipidemia, unspecified: Secondary | ICD-10-CM

## 2020-01-21 DIAGNOSIS — R05 Cough: Secondary | ICD-10-CM | POA: Diagnosis not present

## 2020-01-21 DIAGNOSIS — E1140 Type 2 diabetes mellitus with diabetic neuropathy, unspecified: Secondary | ICD-10-CM | POA: Diagnosis not present

## 2020-01-21 DIAGNOSIS — Z8631 Personal history of diabetic foot ulcer: Secondary | ICD-10-CM

## 2020-01-21 DIAGNOSIS — R059 Cough, unspecified: Secondary | ICD-10-CM

## 2020-01-21 DIAGNOSIS — Z7984 Long term (current) use of oral hypoglycemic drugs: Secondary | ICD-10-CM

## 2020-01-21 DIAGNOSIS — Z9181 History of falling: Secondary | ICD-10-CM

## 2020-01-21 MED ORDER — OMEPRAZOLE 20 MG PO CPDR: 20.0000 mg | DELAYED_RELEASE_CAPSULE | Freq: Every day | ORAL | 3 refills | Status: DC

## 2020-01-21 NOTE — Assessment & Plan Note (Signed)
Could be related to reflux or allergic rhinitis.  He will use his Flonase daily and continue Claritin.  Will start on omeprazole.  If not improving consider further work-up.

## 2020-01-21 NOTE — Assessment & Plan Note (Signed)
Slightly elevated.  We will see what is running when he comes into the office for his wheelchair evaluation.  Continue current regimen.

## 2020-01-21 NOTE — Assessment & Plan Note (Addendum)
Seems to be well controlled.  Change  Metformin to extended release given diarrhea.

## 2020-01-21 NOTE — Progress Notes (Signed)
Virtual Visit via video Note  This visit type was conducted due to national recommendations for restrictions regarding the COVID-19 pandemic (e.g. social distancing).  This format is felt to be most appropriate for this patient at this time.  All issues noted in this document were discussed and addressed.  No physical exam was performed (except for noted visual exam findings with Video Visits).   I connected with Bary Leriche today at 10:00 AM EDT by a video enabled telemedicine application and verified that I am speaking with the correct person using two identifiers. Location patient: home Location provider: work Persons participating in the virtual visit: patient, provider  I discussed the limitations, risks, security and privacy concerns of performing an evaluation and management service by telephone and the availability of in person appointments. I also discussed with the patient that there may be a patient responsible charge related to this service. The patient expressed understanding and agreed to proceed.   Reason for visit: follow-up  HPI: CIDP: Patient was recently discharged from rehab.  He notes he got to the point where he could stand up for 2 minutes while at rehab and walked for 8 feet.  His legs are still weak and does still have some arm weakness that makes it difficult for him to push a self-propelled wheelchair.  Overall he feels pretty good otherwise.  No numbness.  He is ready to try to readapt himself to living at home.  He does have mobility issues and we have ordered a electric wheelchair.  His goal is to be mobile within the home on his own 7 days a week.  Hypertension: Typically less than [id number] systolically.  Taking amlodipine, losartan, and HCTZ.  No chest pain or shortness of breath.  Diabetes: Highest glucose was 114.  Taking metformin.  No polyuria or polydipsia.  No hypoglycemia.  Diarrhea: Patient notes this has been going on for a few months.  He notes  occasional bloating.  No blood in stool.  No abdominal pain.  Cough: This has been occurring at night for several months.  Does report some reflux with burning and sour taste.  He also has allergies and has some postnasal drip.  He does take Claritin and Flonase though does not consistently use the Flonase.   ROS: See pertinent positives and negatives per HPI.  Past Medical History:  Diagnosis Date  . Cataract march, april   bilateral removed   . Chickenpox   . Diabetes mellitus without complication (HCC)    metformin   . Diabetic amyotrophy [facility])    sees dr at [facility]  . Gastric ulcer   . Heart murmur    When he was younger, no recent issues  . High blood pressure    controlled with meds  . Hypercholesteremia    controlled with medication  . Knee injury    left  . Motion sickness    boats  . Neuropathy     Past Surgical History:  Procedure Laterality Date  . CATARACT EXTRACTION W/PHACO Right 02/01/2016   Procedure: CATARACT EXTRACTION PHACO AND INTRAOCULAR LENS PLACEMENT ([place]) right;  Surgeon: Ronnell Freshwater, MD;  Location: [place];  Service: Ophthalmology;  Laterality: Right;  DIABETIC - oral meds  . CATARACT EXTRACTION W/PHACO Left 03/07/2016   Procedure: CATARACT EXTRACTION PHACO AND INTRAOCULAR LENS PLACEMENT ([facility]);  Surgeon: Ronnell Freshwater, MD;  Location: [place];  Service: Ophthalmology;  Laterality: Left;  DIABETIC - oral meds   . circumscision  Family History  Problem Relation Age of Onset  . Alcoholism Father   . Hyperlipidemia Father   . Hypertension Father   . Throat cancer Father        smoked but had stopped 20 yeras before dx  . Diabetes Mother   . Emphysema Mother   . Kidney disease Neg Hx   . Prostate cancer Neg Hx   . Colon cancer Neg Hx   . Colon polyps Neg Hx   . Esophageal cancer Neg Hx   . Rectal cancer Neg Hx   . Stomach cancer Neg Hx   . Kidney cancer Neg Hx   . Bladder Cancer Neg Hx      SOCIAL HX: Former smoker   Current Outpatient Medications:  .  albuterol (VENTOLIN HFA) 108 (90 Base) MCG/ACT inhaler, Inhale 2-4 puffs by mouth every 4 hours as needed for wheezing, cough, and/or shortness of breath, Disp: 8 g, Rfl: 1 .  amLODipine (NORVASC) 10 MG tablet, TAKE 1 TABLET BY MOUTH EVERY DAY, Disp: 30 tablet, Rfl: 0 .  atorvastatin (LIPITOR) 40 MG tablet, TAKE 1 TABLET BY MOUTH DAILY, Disp: 30 tablet, Rfl: 1 .  clomiPHENE (CLOMID) 50 MG tablet, Take 1/2 tablet daily, Disp: 30 tablet, Rfl: 3 .  escitalopram (LEXAPRO) 20 MG tablet, Take 20 mg by mouth daily., Disp: , Rfl:  .  fluticasone (FLONASE) 50 MCG/ACT nasal spray, Place 2 sprays into both nostrils daily., Disp: 16 g, Rfl: 6 .  gabapentin (NEURONTIN) 300 MG capsule, Take 1 capsule by mouth 2 (two) times daily. Take 1 cap in AM and 2 caps at bedtime, Disp: , Rfl:  .  GAMUNEX-C 20 GM/200ML SOLN, , Disp: , Rfl:  .  hydrochlorothiazide (HYDRODIURIL) 12.5 MG tablet, TAKE 1 TABLET BY MOUTH EVERY DAY, Disp: 90 tablet, Rfl: 1 .  linaclotide (LINZESS) 72 MCG capsule, Take 1 capsule (72 mcg total) by mouth daily before breakfast., Disp: 30 capsule, Rfl: 1 .  loratadine (CLARITIN) 10 MG tablet, TAKE 1 TABLET EVERY DAY, Disp: 30 tablet, Rfl: 2 .  losartan (COZAAR) 100 MG tablet, TAKE 1 TABLET BY MOUTH EVERY DAY (Patient taking differently: Take 100 mg by mouth daily. ), Disp: 90 tablet, Rfl: 3 .  metFORMIN (GLUCOPHAGE) 500 MG tablet, TAKE 2 TABLETS IN MORNING AND 1 TABLET AT NIGHT (Patient taking differently: Take 500-1,000 mg by mouth 2 (two) times daily with a meal. TAKE 2 TABLETS IN MORNING AND 1 TABLET AT NIGHT), Disp: 180 tablet, Rfl: 5 .  Multiple Vitamin (MULTIVITAMIN) capsule, Take 1 capsule by mouth daily., Disp: , Rfl:  .  Omega-3 Fatty Acids (FISH OIL PO), Take 1 capsule by mouth daily. , Disp: , Rfl:  .  omeprazole (PRILOSEC) 20 MG capsule, Take 1 capsule (20 mg total) by mouth daily., Disp: 30 capsule, Rfl: 3 .   ondansetron (ZOFRAN ODT) 4 MG disintegrating tablet, Allow 1-2 tablets to dissolve in your mouth every 8 hours as needed for nausea/vomiting (Patient not taking: Reported on 01/21/2020), Disp: 30 tablet, Rfl: 0 .  predniSONE (DELTASONE) 10 MG tablet, Take 50 mg daily on day 1 and 2 Take 40 mg daily on day 3 and 4 Take 30 mg daily on day 5 and 6 Take 20 mg daily on day 7 and 8 Take 10 mg daily on day 9 and 10 (Patient not taking: Reported on 01/21/2020), Disp: 30 tablet, Rfl: 0 .  sildenafil (VIAGRA) 50 MG tablet, Take 50 mg by mouth as needed for erectile dysfunction. ,  Disp: , Rfl:   EXAM:  VITALS per patient if applicable:  GENERAL: alert, oriented, appears well and in no acute distress  HEENT: atraumatic, conjunttiva clear, no obvious abnormalities on inspection of external nose and ears  NECK: normal movements of the head and neck  LUNGS: on inspection no signs of respiratory distress, breathing rate appears normal, no obvious gross SOB, gasping or wheezing  CV: no obvious cyanosis  MS: moves all visible extremities without noticeable abnormality  PSYCH/NEURO: pleasant and cooperative, no obvious depression or anxiety, speech and thought processing grossly intact  ASSESSMENT AND PLAN:  Discussed the following assessment and plan:  Essential hypertension Slightly elevated.  We will see what is running when he comes into the office for his wheelchair evaluation.  Continue current regimen.  Diabetes ([place]) Seems to be well controlled.  Change  Metformin to extended release given diarrhea.  Diarrhea Possibly related to Metformin.  We will see how he does on extended release Metformin.  CIDP (chronic inflammatory demyelinating polyneuropathy) (HCC) Patient has recovered some strength though still has significant issues.  We will get home health set up for him.  He is going to be coming in for an Comptroller.  I will have Gae Bon contact the company to see if they can fax  his paperwork for his visit on the fifth.  Cough Could be related to reflux or allergic rhinitis.  He will use his Flonase daily and continue Claritin.  Will start on omeprazole.  If not improving consider further work-up.   No orders of the defined types were placed in this encounter.   Meds ordered this encounter  Medications  . omeprazole (PRILOSEC) 20 MG capsule    Sig: Take 1 capsule (20 mg total) by mouth daily.    Dispense:  30 capsule    Refill:  3     I discussed the assessment and treatment plan with the patient. The patient was provided an opportunity to ask questions and all were answered. The patient agreed with the plan and demonstrated an understanding of the instructions.   The patient was advised to call back or seek an in-person evaluation if the symptoms worsen or if the condition fails to improve as anticipated.    [name], MD

## 2020-01-21 NOTE — Assessment & Plan Note (Signed)
Patient has recovered some strength though still has significant issues.  We will get home health set up for him.  He is going to be coming in for an Comptroller.  I will have Gae Bon contact the company to see if they can fax his paperwork for his visit on the fifth.

## 2020-01-21 NOTE — Telephone Encounter (Signed)
-----   Message from Leone Haven, MD sent at 01/21/2020 11:32 AM EDT ----- Can you contact the company that we are using to get the electric wheel chair and make sure they fax the forms needed so I can have them for his visit on 4/5?

## 2020-01-21 NOTE — Assessment & Plan Note (Signed)
Possibly related to Metformin.  We will see how he does on extended release Metformin.

## 2020-01-22 ENCOUNTER — Other Ambulatory Visit: Payer: Self-pay

## 2020-01-22 DIAGNOSIS — M6281 Muscle weakness (generalized): Secondary | ICD-10-CM | POA: Diagnosis not present

## 2020-01-22 DIAGNOSIS — E1144 Type 2 diabetes mellitus with diabetic amyotrophy: Secondary | ICD-10-CM | POA: Diagnosis not present

## 2020-01-22 DIAGNOSIS — I10 Essential (primary) hypertension: Secondary | ICD-10-CM | POA: Diagnosis not present

## 2020-01-22 DIAGNOSIS — E1140 Type 2 diabetes mellitus with diabetic neuropathy, unspecified: Secondary | ICD-10-CM | POA: Diagnosis not present

## 2020-01-22 DIAGNOSIS — G6181 Chronic inflammatory demyelinating polyneuritis: Secondary | ICD-10-CM | POA: Diagnosis not present

## 2020-01-22 DIAGNOSIS — S39012D Strain of muscle, fascia and tendon of lower back, subsequent encounter: Secondary | ICD-10-CM | POA: Diagnosis not present

## 2020-01-22 NOTE — Telephone Encounter (Signed)
Noted. Please hold on to the form and give it to me when he comes in to be seen.

## 2020-01-23 ENCOUNTER — Other Ambulatory Visit: Payer: Self-pay | Admitting: Family Medicine

## 2020-01-23 ENCOUNTER — Telehealth: Payer: Self-pay | Admitting: Family Medicine

## 2020-01-23 DIAGNOSIS — E1144 Type 2 diabetes mellitus with diabetic amyotrophy: Secondary | ICD-10-CM | POA: Diagnosis not present

## 2020-01-23 DIAGNOSIS — G6181 Chronic inflammatory demyelinating polyneuritis: Secondary | ICD-10-CM | POA: Diagnosis not present

## 2020-01-23 DIAGNOSIS — E7800 Pure hypercholesterolemia, unspecified: Secondary | ICD-10-CM | POA: Diagnosis not present

## 2020-01-23 DIAGNOSIS — Z9181 History of falling: Secondary | ICD-10-CM | POA: Diagnosis not present

## 2020-01-23 DIAGNOSIS — Z7984 Long term (current) use of oral hypoglycemic drugs: Secondary | ICD-10-CM | POA: Diagnosis not present

## 2020-01-23 DIAGNOSIS — I10 Essential (primary) hypertension: Secondary | ICD-10-CM | POA: Diagnosis not present

## 2020-01-23 NOTE — Telephone Encounter (Signed)
[facility] called-per pt he is discharging himself from there services and is going with another home health aid

## 2020-01-25 NOTE — Telephone Encounter (Signed)
Noted  

## 2020-01-27 ENCOUNTER — Telehealth: Payer: Medicare Other | Admitting: Family Medicine

## 2020-01-27 NOTE — Progress Notes (Signed)
Form for electric received today, patient rescheduled visit til 02/10/2020.  Bertrand Vowels,cma

## 2020-01-28 ENCOUNTER — Ambulatory Visit: Payer: Medicaid Other | Admitting: Urology

## 2020-01-28 DIAGNOSIS — Z9181 History of falling: Secondary | ICD-10-CM | POA: Diagnosis not present

## 2020-01-28 DIAGNOSIS — E1144 Type 2 diabetes mellitus with diabetic amyotrophy: Secondary | ICD-10-CM | POA: Diagnosis not present

## 2020-01-28 DIAGNOSIS — Z7984 Long term (current) use of oral hypoglycemic drugs: Secondary | ICD-10-CM | POA: Diagnosis not present

## 2020-01-28 DIAGNOSIS — G6181 Chronic inflammatory demyelinating polyneuritis: Secondary | ICD-10-CM | POA: Diagnosis not present

## 2020-01-28 DIAGNOSIS — I10 Essential (primary) hypertension: Secondary | ICD-10-CM | POA: Diagnosis not present

## 2020-01-28 DIAGNOSIS — E7800 Pure hypercholesterolemia, unspecified: Secondary | ICD-10-CM | POA: Diagnosis not present

## 2020-01-29 ENCOUNTER — Telehealth: Payer: Self-pay | Admitting: Family Medicine

## 2020-01-29 DIAGNOSIS — G6181 Chronic inflammatory demyelinating polyneuritis: Secondary | ICD-10-CM | POA: Diagnosis not present

## 2020-01-29 DIAGNOSIS — Z9181 History of falling: Secondary | ICD-10-CM | POA: Diagnosis not present

## 2020-01-29 DIAGNOSIS — E1144 Type 2 diabetes mellitus with diabetic amyotrophy: Secondary | ICD-10-CM | POA: Diagnosis not present

## 2020-01-29 DIAGNOSIS — Z7984 Long term (current) use of oral hypoglycemic drugs: Secondary | ICD-10-CM | POA: Diagnosis not present

## 2020-01-29 DIAGNOSIS — I10 Essential (primary) hypertension: Secondary | ICD-10-CM | POA: Diagnosis not present

## 2020-01-29 DIAGNOSIS — E7800 Pure hypercholesterolemia, unspecified: Secondary | ICD-10-CM | POA: Diagnosis not present

## 2020-01-29 NOTE — Telephone Encounter (Signed)
I would think home health will be able to help him arrange for a wheelchair ramp.  They likely have a Education officer, museum that could help with that.  He should ask them if he has not already done that.  Are his toenails thickened and discolored?  If they are we could have him see a podiatrist.

## 2020-01-29 NOTE — Telephone Encounter (Signed)
Patient was wondering if the office knows how he can get a wheel chair ramp. Also, he needs his toe nails clipped and wanted to know who could do that for him. Home Health care said they do not do toe nails.

## 2020-01-29 NOTE — Telephone Encounter (Signed)
Patient was wondering if the office knows how he can get a wheel chair ramp. Also, he needs his toe nails clipped and wanted to know who could do that for him. Home Health care said they do not do toe nails.  Michael Montgomery,cma

## 2020-01-30 NOTE — Telephone Encounter (Signed)
I agree with asking the social worker about the nail clipping. To my knowledge the only place to have this done is at the podiatrist though the social worker may know of other resources.

## 2020-01-30 NOTE — Telephone Encounter (Signed)
I called and informed the patient to call his social worker about the wheelchair ramp, also patient stated he already sees a podiatrist he wanted to know if anyone would come to the house and clip his toenails because without a ramp he can't go anywhere.   I informed him that I am not sure but he could ask the Education officer, museum.

## 2020-01-31 ENCOUNTER — Telehealth: Payer: Self-pay | Admitting: Family Medicine

## 2020-01-31 MED ORDER — METFORMIN HCL ER 500 MG PO TB24: 1500.0000 mg | ORAL_TABLET | Freq: Every day | ORAL | 1 refills | Status: DC

## 2020-01-31 NOTE — Telephone Encounter (Signed)
Sent to pharmacy 

## 2020-01-31 NOTE — Telephone Encounter (Signed)
Pt called and said Dr. Caryl Bis was supposed to be sending metformin time release to his pharmacy after his visit on 01/27/20. His pharmacy said they haven't received anything yet. Please advise.

## 2020-02-03 DIAGNOSIS — E7800 Pure hypercholesterolemia, unspecified: Secondary | ICD-10-CM | POA: Diagnosis not present

## 2020-02-03 DIAGNOSIS — Z7984 Long term (current) use of oral hypoglycemic drugs: Secondary | ICD-10-CM | POA: Diagnosis not present

## 2020-02-03 DIAGNOSIS — E1144 Type 2 diabetes mellitus with diabetic amyotrophy: Secondary | ICD-10-CM | POA: Diagnosis not present

## 2020-02-03 DIAGNOSIS — G6181 Chronic inflammatory demyelinating polyneuritis: Secondary | ICD-10-CM | POA: Diagnosis not present

## 2020-02-03 DIAGNOSIS — Z9181 History of falling: Secondary | ICD-10-CM | POA: Diagnosis not present

## 2020-02-03 DIAGNOSIS — I10 Essential (primary) hypertension: Secondary | ICD-10-CM | POA: Diagnosis not present

## 2020-02-04 DIAGNOSIS — I10 Essential (primary) hypertension: Secondary | ICD-10-CM | POA: Diagnosis not present

## 2020-02-04 DIAGNOSIS — E7800 Pure hypercholesterolemia, unspecified: Secondary | ICD-10-CM | POA: Diagnosis not present

## 2020-02-04 DIAGNOSIS — G6181 Chronic inflammatory demyelinating polyneuritis: Secondary | ICD-10-CM | POA: Diagnosis not present

## 2020-02-04 DIAGNOSIS — Z9181 History of falling: Secondary | ICD-10-CM | POA: Diagnosis not present

## 2020-02-04 DIAGNOSIS — E1144 Type 2 diabetes mellitus with diabetic amyotrophy: Secondary | ICD-10-CM | POA: Diagnosis not present

## 2020-02-04 DIAGNOSIS — Z7984 Long term (current) use of oral hypoglycemic drugs: Secondary | ICD-10-CM | POA: Diagnosis not present

## 2020-02-05 DIAGNOSIS — E1144 Type 2 diabetes mellitus with diabetic amyotrophy: Secondary | ICD-10-CM | POA: Diagnosis not present

## 2020-02-05 DIAGNOSIS — Z7984 Long term (current) use of oral hypoglycemic drugs: Secondary | ICD-10-CM | POA: Diagnosis not present

## 2020-02-05 DIAGNOSIS — I10 Essential (primary) hypertension: Secondary | ICD-10-CM | POA: Diagnosis not present

## 2020-02-05 DIAGNOSIS — Z9181 History of falling: Secondary | ICD-10-CM | POA: Diagnosis not present

## 2020-02-05 DIAGNOSIS — G6181 Chronic inflammatory demyelinating polyneuritis: Secondary | ICD-10-CM | POA: Diagnosis not present

## 2020-02-05 DIAGNOSIS — E7800 Pure hypercholesterolemia, unspecified: Secondary | ICD-10-CM | POA: Diagnosis not present

## 2020-02-07 DIAGNOSIS — E7800 Pure hypercholesterolemia, unspecified: Secondary | ICD-10-CM | POA: Diagnosis not present

## 2020-02-07 DIAGNOSIS — E1144 Type 2 diabetes mellitus with diabetic amyotrophy: Secondary | ICD-10-CM | POA: Diagnosis not present

## 2020-02-07 DIAGNOSIS — G6181 Chronic inflammatory demyelinating polyneuritis: Secondary | ICD-10-CM | POA: Diagnosis not present

## 2020-02-07 DIAGNOSIS — I10 Essential (primary) hypertension: Secondary | ICD-10-CM | POA: Diagnosis not present

## 2020-02-07 DIAGNOSIS — Z9181 History of falling: Secondary | ICD-10-CM | POA: Diagnosis not present

## 2020-02-07 DIAGNOSIS — Z7984 Long term (current) use of oral hypoglycemic drugs: Secondary | ICD-10-CM | POA: Diagnosis not present

## 2020-02-10 ENCOUNTER — Other Ambulatory Visit: Payer: Self-pay

## 2020-02-10 ENCOUNTER — Telehealth (INDEPENDENT_AMBULATORY_CARE_PROVIDER_SITE_OTHER): Payer: Medicare HMO | Admitting: Family Medicine

## 2020-02-10 ENCOUNTER — Encounter: Payer: Self-pay | Admitting: Family Medicine

## 2020-02-10 DIAGNOSIS — E119 Type 2 diabetes mellitus without complications: Secondary | ICD-10-CM | POA: Diagnosis not present

## 2020-02-10 DIAGNOSIS — Z7984 Long term (current) use of oral hypoglycemic drugs: Secondary | ICD-10-CM | POA: Diagnosis not present

## 2020-02-10 DIAGNOSIS — E1144 Type 2 diabetes mellitus with diabetic amyotrophy: Secondary | ICD-10-CM | POA: Diagnosis not present

## 2020-02-10 DIAGNOSIS — R634 Abnormal weight loss: Secondary | ICD-10-CM | POA: Diagnosis not present

## 2020-02-10 DIAGNOSIS — Z9181 History of falling: Secondary | ICD-10-CM | POA: Diagnosis not present

## 2020-02-10 DIAGNOSIS — G6181 Chronic inflammatory demyelinating polyneuritis: Secondary | ICD-10-CM | POA: Diagnosis not present

## 2020-02-10 DIAGNOSIS — R197 Diarrhea, unspecified: Secondary | ICD-10-CM | POA: Diagnosis not present

## 2020-02-10 DIAGNOSIS — R42 Dizziness and giddiness: Secondary | ICD-10-CM | POA: Diagnosis not present

## 2020-02-10 DIAGNOSIS — E7800 Pure hypercholesterolemia, unspecified: Secondary | ICD-10-CM | POA: Diagnosis not present

## 2020-02-10 DIAGNOSIS — I10 Essential (primary) hypertension: Secondary | ICD-10-CM | POA: Diagnosis not present

## 2020-02-10 NOTE — Progress Notes (Signed)
Virtual Visit via video Note  This visit type was conducted due to national recommendations for restrictions regarding the COVID-19 pandemic (e.g. social distancing).  This format is felt to be most appropriate for this patient at this time.  All issues noted in this document were discussed and addressed.  No physical exam was performed (except for noted visual exam findings with Video Visits).   I connected with Michael Montgomery today at  9:30 AM EDT by a video enabled telemedicine application and verified that I am speaking with the correct person using two identifiers. Location patient: home Location provider: work Persons participating in the virtual visit: patient, provider  I discussed the limitations, risks, security and privacy concerns of performing an evaluation and management service by telephone and the availability of in person appointments. I also discussed with the patient that there may be a patient responsible charge related to this service. The patient expressed understanding and agreed to proceed.   Reason for visit: Follow-up for mobility assessment  HPI: CIDP/mobility assessment: Patient has continued difficulty navigating his house.  He has upper extremity weakness that makes it difficult to use a manual wheelchair on his carpeted floors.  The most he can stand for is about 5 minutes and he can only take a couple of steps at a time.  An electric wheelchair would allow him to be mobile within his house.  It would help him use the bathroom on his own and have him cook and dress himself.  He notes home PT has been helpful.  Lightheadedness: Patient notes this has been occurring recently.  Typically occurs when he gets up.  Blood pressure has been as low as 110/60.  He does take both of his blood pressure medications.  Diarrhea: The Metformin XR did make a difference.  He continues to have some amount of runny stools.  No abdominal pain or blood in stool.  Weight loss:  Patient notes this occurred after he got Covid.  He lost a lot of muscle mass.  His taste has returned though his smell has not.  He does not have as much of an appetite since having Covid.   ROS: See pertinent positives and negatives per HPI.  Past Medical History:  Diagnosis Date  . Cataract march, april   bilateral removed   . Chickenpox   . Diabetes mellitus without complication (HCC)    metformin   . Diabetic amyotrophy [facility]    sees dr at [facility]  . Gastric ulcer   . Heart murmur    When he was younger, no recent issues  . High blood pressure    controlled with meds  . Hypercholesteremia    controlled with medication  . Knee injury    left  . Motion sickness    boats  . Neuropathy     Past Surgical History:  Procedure Laterality Date  . CATARACT EXTRACTION W/PHACO Right 02/01/2016   Procedure: CATARACT EXTRACTION PHACO AND INTRAOCULAR LENS PLACEMENT ([place]) right;  Surgeon: Ronnell Freshwater, MD;  Location: [place];  Service: Ophthalmology;  Laterality: Right;  DIABETIC - oral meds  . CATARACT EXTRACTION W/PHACO Left 03/07/2016   Procedure: CATARACT EXTRACTION PHACO AND INTRAOCULAR LENS PLACEMENT ([facility]);  Surgeon: Ronnell Freshwater, MD;  Location: [place];  Service: Ophthalmology;  Laterality: Left;  DIABETIC - oral meds   . circumscision      Family History  Problem Relation Age of Onset  . Alcoholism Father   . Hyperlipidemia  Father   . Hypertension Father   . Throat cancer Father        smoked but had stopped 20 yeras before dx  . Diabetes Mother   . Emphysema Mother   . Kidney disease Neg Hx   . Prostate cancer Neg Hx   . Colon cancer Neg Hx   . Colon polyps Neg Hx   . Esophageal cancer Neg Hx   . Rectal cancer Neg Hx   . Stomach cancer Neg Hx   . Kidney cancer Neg Hx   . Bladder Cancer Neg Hx     SOCIAL HX: former smoker   Current Outpatient Medications:  .  albuterol (VENTOLIN HFA) 108 (90 Base) MCG/ACT inhaler,  Inhale 2-4 puffs by mouth every 4 hours as needed for wheezing, cough, and/or shortness of breath, Disp: 8 g, Rfl: 1 .  amLODipine (NORVASC) 10 MG tablet, TAKE 1 TABLET BY MOUTH EVERY DAY, Disp: 30 tablet, Rfl: 0 .  atorvastatin (LIPITOR) 40 MG tablet, TAKE 1 TABLET BY MOUTH DAILY, Disp: 30 tablet, Rfl: 1 .  clomiPHENE (CLOMID) 50 MG tablet, Take 1/2 tablet daily, Disp: 30 tablet, Rfl: 3 .  escitalopram (LEXAPRO) 20 MG tablet, Take 20 mg by mouth daily., Disp: , Rfl:  .  fluticasone (FLONASE) 50 MCG/ACT nasal spray, Place 2 sprays into both nostrils daily., Disp: 16 g, Rfl: 6 .  gabapentin (NEURONTIN) 300 MG capsule, Take 1 capsule by mouth 2 (two) times daily. Take 1 cap in AM and 2 caps at bedtime, Disp: , Rfl:  .  GAMUNEX-C 20 GM/200ML SOLN, , Disp: , Rfl:  .  linaclotide (LINZESS) 72 MCG capsule, Take 1 capsule (72 mcg total) by mouth daily before breakfast., Disp: 30 capsule, Rfl: 1 .  loratadine (CLARITIN) 10 MG tablet, TAKE 1 TABLET EVERY DAY, Disp: 30 tablet, Rfl: 2 .  losartan (COZAAR) 100 MG tablet, TAKE 1 TABLET BY MOUTH EVERY DAY (Patient taking differently: Take 100 mg by mouth daily. ), Disp: 90 tablet, Rfl: 3 .  Multiple Vitamin (MULTIVITAMIN) capsule, Take 1 capsule by mouth daily., Disp: , Rfl:  .  Omega-3 Fatty Acids (FISH OIL PO), Take 1 capsule by mouth daily. , Disp: , Rfl:  .  omeprazole (PRILOSEC) 20 MG capsule, Take 1 capsule (20 mg total) by mouth daily., Disp: 30 capsule, Rfl: 3 .  ondansetron (ZOFRAN ODT) 4 MG disintegrating tablet, Allow 1-2 tablets to dissolve in your mouth every 8 hours as needed for nausea/vomiting, Disp: 30 tablet, Rfl: 0 .  predniSONE (DELTASONE) 10 MG tablet, Take 50 mg daily on day 1 and 2 Take 40 mg daily on day 3 and 4 Take 30 mg daily on day 5 and 6 Take 20 mg daily on day 7 and 8 Take 10 mg daily on day 9 and 10, Disp: 30 tablet, Rfl: 0 .  sildenafil (VIAGRA) 50 MG tablet, Take 50 mg by mouth as needed for erectile dysfunction. , Disp: , Rfl:    .  empagliflozin (JARDIANCE) 10 MG TABS tablet, Take 10 mg by mouth daily before breakfast., Disp: 30 tablet, Rfl: 2  EXAM:  VITALS per patient if applicable:  GENERAL: alert, oriented, appears well and in no acute distress  HEENT: atraumatic, conjunttiva clear, no obvious abnormalities on inspection of external nose and ears  NECK: normal movements of the head and neck  LUNGS: on inspection no signs of respiratory distress, breathing rate appears normal, no obvious gross SOB, gasping or wheezing  CV: no obvious  cyanosis  MS: moves all visible extremities without noticeable abnormality  PSYCH/NEURO: pleasant and cooperative, no obvious depression or anxiety, speech and thought processing grossly intact  ASSESSMENT AND PLAN:  Discussed the following assessment and plan:  CIDP (chronic inflammatory demyelinating polyneuropathy) ([place]) This visit was for a mobility assessment for CIDP.  The patient would benefit from a electric wheelchair.  He knows that he will have to undergo PT evaluation for this.  Lightheadedness Blood pressure is likely overtreated given his weight loss.  He will discontinue his HCTZ.  He will continue to monitor the lightheadedness and the blood pressure and let us know if it does not improve.  Diarrhea Likely related to Metformin.  We will discontinue the metformin XR and start Jardiance.  Diabetes ([place]) Discontinue Metformin XR.  Start [place].  Weight loss Post COVID-19 weight loss.  Also following worsening of his CIDP.  Suspect related to muscle loss.  We will see if we can get basic labs on him through his home health company.   No orders of the defined types were placed in this encounter.   Meds ordered this encounter  Medications  . empagliflozin (JARDIANCE) 10 MG TABS tablet    Sig: Take 10 mg by mouth daily before breakfast.    Dispense:  30 tablet    Refill:  2     I discussed the assessment and treatment plan with the patient. The  patient was provided an opportunity to ask questions and all were answered. The patient agreed with the plan and demonstrated an understanding of the instructions.   The patient was advised to call back or seek an in-person evaluation if the symptoms worsen or if the condition fails to improve as anticipated.    [name], MD

## 2020-02-11 DIAGNOSIS — I10 Essential (primary) hypertension: Secondary | ICD-10-CM | POA: Diagnosis not present

## 2020-02-11 DIAGNOSIS — G6181 Chronic inflammatory demyelinating polyneuritis: Secondary | ICD-10-CM | POA: Diagnosis not present

## 2020-02-11 DIAGNOSIS — E785 Hyperlipidemia, unspecified: Secondary | ICD-10-CM | POA: Diagnosis not present

## 2020-02-11 DIAGNOSIS — M6281 Muscle weakness (generalized): Secondary | ICD-10-CM | POA: Diagnosis not present

## 2020-02-11 DIAGNOSIS — R29898 Other symptoms and signs involving the musculoskeletal system: Secondary | ICD-10-CM | POA: Diagnosis not present

## 2020-02-11 DIAGNOSIS — L97519 Non-pressure chronic ulcer of other part of right foot with unspecified severity: Secondary | ICD-10-CM | POA: Diagnosis not present

## 2020-02-11 DIAGNOSIS — R42 Dizziness and giddiness: Secondary | ICD-10-CM | POA: Insufficient documentation

## 2020-02-11 DIAGNOSIS — M7542 Impingement syndrome of left shoulder: Secondary | ICD-10-CM | POA: Diagnosis not present

## 2020-02-11 DIAGNOSIS — E1144 Type 2 diabetes mellitus with diabetic amyotrophy: Secondary | ICD-10-CM | POA: Diagnosis not present

## 2020-02-11 DIAGNOSIS — R634 Abnormal weight loss: Secondary | ICD-10-CM | POA: Insufficient documentation

## 2020-02-11 DIAGNOSIS — R262 Difficulty in walking, not elsewhere classified: Secondary | ICD-10-CM | POA: Diagnosis not present

## 2020-02-11 DIAGNOSIS — R531 Weakness: Secondary | ICD-10-CM | POA: Diagnosis not present

## 2020-02-11 DIAGNOSIS — S39012D Strain of muscle, fascia and tendon of lower back, subsequent encounter: Secondary | ICD-10-CM | POA: Diagnosis not present

## 2020-02-11 MED ORDER — EMPAGLIFLOZIN 10 MG PO TABS: 10.0000 mg | ORAL_TABLET | Freq: Every day | ORAL | 2 refills | Status: DC

## 2020-02-11 NOTE — Assessment & Plan Note (Signed)
Discontinue Metformin XR.  Start [place].

## 2020-02-11 NOTE — Assessment & Plan Note (Signed)
Blood pressure is likely overtreated given his weight loss.  He will discontinue his HCTZ.  He will continue to monitor the lightheadedness and the blood pressure and let us know if it does not improve.

## 2020-02-11 NOTE — Assessment & Plan Note (Signed)
Post COVID-19 weight loss.  Also following worsening of his CIDP.  Suspect related to muscle loss.  We will see if we can get basic labs on him through his home health company.

## 2020-02-11 NOTE — Assessment & Plan Note (Signed)
This visit was for a mobility assessment for CIDP.  The patient would benefit from a electric wheelchair.  He knows that he will have to undergo PT evaluation for this.

## 2020-02-11 NOTE — Assessment & Plan Note (Signed)
Likely related to Metformin.  We will discontinue the metformin XR and start Jardiance.

## 2020-02-12 DIAGNOSIS — G6181 Chronic inflammatory demyelinating polyneuritis: Secondary | ICD-10-CM | POA: Diagnosis not present

## 2020-02-12 DIAGNOSIS — E1144 Type 2 diabetes mellitus with diabetic amyotrophy: Secondary | ICD-10-CM | POA: Diagnosis not present

## 2020-02-12 DIAGNOSIS — Z7984 Long term (current) use of oral hypoglycemic drugs: Secondary | ICD-10-CM | POA: Diagnosis not present

## 2020-02-12 DIAGNOSIS — E7800 Pure hypercholesterolemia, unspecified: Secondary | ICD-10-CM | POA: Diagnosis not present

## 2020-02-12 DIAGNOSIS — Z9181 History of falling: Secondary | ICD-10-CM | POA: Diagnosis not present

## 2020-02-12 DIAGNOSIS — I10 Essential (primary) hypertension: Secondary | ICD-10-CM | POA: Diagnosis not present

## 2020-02-14 DIAGNOSIS — G6181 Chronic inflammatory demyelinating polyneuritis: Secondary | ICD-10-CM | POA: Diagnosis not present

## 2020-02-14 DIAGNOSIS — E1144 Type 2 diabetes mellitus with diabetic amyotrophy: Secondary | ICD-10-CM | POA: Diagnosis not present

## 2020-02-14 DIAGNOSIS — Z9181 History of falling: Secondary | ICD-10-CM | POA: Diagnosis not present

## 2020-02-14 DIAGNOSIS — I10 Essential (primary) hypertension: Secondary | ICD-10-CM | POA: Diagnosis not present

## 2020-02-14 DIAGNOSIS — Z7984 Long term (current) use of oral hypoglycemic drugs: Secondary | ICD-10-CM | POA: Diagnosis not present

## 2020-02-14 DIAGNOSIS — E7800 Pure hypercholesterolemia, unspecified: Secondary | ICD-10-CM | POA: Diagnosis not present

## 2020-02-17 ENCOUNTER — Telehealth: Payer: Self-pay | Admitting: Family Medicine

## 2020-02-17 DIAGNOSIS — Z7984 Long term (current) use of oral hypoglycemic drugs: Secondary | ICD-10-CM | POA: Diagnosis not present

## 2020-02-17 DIAGNOSIS — E7800 Pure hypercholesterolemia, unspecified: Secondary | ICD-10-CM | POA: Diagnosis not present

## 2020-02-17 DIAGNOSIS — G6181 Chronic inflammatory demyelinating polyneuritis: Secondary | ICD-10-CM | POA: Diagnosis not present

## 2020-02-17 DIAGNOSIS — E1144 Type 2 diabetes mellitus with diabetic amyotrophy: Secondary | ICD-10-CM | POA: Diagnosis not present

## 2020-02-17 DIAGNOSIS — Z9181 History of falling: Secondary | ICD-10-CM | POA: Diagnosis not present

## 2020-02-17 DIAGNOSIS — I10 Essential (primary) hypertension: Secondary | ICD-10-CM | POA: Diagnosis not present

## 2020-02-17 NOTE — Telephone Encounter (Signed)
Pt called and said that some medical supply stores carry ensure in bulk. He is wanting an order to be able to get some kind of supplement to help with him not being able to eat much.

## 2020-02-17 NOTE — Telephone Encounter (Signed)
Pt called and said that some medical supply stores carry ensure in bulk. He is wanting an order to be able to get some kind of supplement to help with him not being able to eat much. Azzure Garabedian,cma

## 2020-02-18 NOTE — Telephone Encounter (Signed)
How do we get this type of thing ordered for the patient?

## 2020-02-18 NOTE — Telephone Encounter (Signed)
I can order from Epic if I know exactly what you prefer for patient to be on Ensure plus or with protein or simply Ensure? Have with Extra calories and low glycemic.

## 2020-02-19 DIAGNOSIS — E1144 Type 2 diabetes mellitus with diabetic amyotrophy: Secondary | ICD-10-CM | POA: Diagnosis not present

## 2020-02-19 DIAGNOSIS — G6181 Chronic inflammatory demyelinating polyneuritis: Secondary | ICD-10-CM | POA: Diagnosis not present

## 2020-02-19 DIAGNOSIS — E7800 Pure hypercholesterolemia, unspecified: Secondary | ICD-10-CM | POA: Diagnosis not present

## 2020-02-19 DIAGNOSIS — I10 Essential (primary) hypertension: Secondary | ICD-10-CM | POA: Diagnosis not present

## 2020-02-19 DIAGNOSIS — Z9181 History of falling: Secondary | ICD-10-CM | POA: Diagnosis not present

## 2020-02-19 DIAGNOSIS — Z7984 Long term (current) use of oral hypoglycemic drugs: Secondary | ICD-10-CM | POA: Diagnosis not present

## 2020-02-19 NOTE — Telephone Encounter (Signed)
I would like ensure with protein for him.

## 2020-02-20 MED ORDER — ENSURE ACTIVE HIGH PROTEIN PO LIQD: 1.0000 | Freq: Three times a day (TID) | ORAL | 11 refills | Status: DC | PRN

## 2020-02-20 NOTE — Telephone Encounter (Signed)
I have printed the order and given to Gae Bon for your signature.

## 2020-02-20 NOTE — Telephone Encounter (Signed)
Please placed this in my sign basket and I can take care of it.

## 2020-02-20 NOTE — Telephone Encounter (Signed)
Put in sign basket.  Holiday Mcmenamin,cma  

## 2020-02-24 DIAGNOSIS — E1144 Type 2 diabetes mellitus with diabetic amyotrophy: Secondary | ICD-10-CM | POA: Diagnosis not present

## 2020-02-24 DIAGNOSIS — Z7984 Long term (current) use of oral hypoglycemic drugs: Secondary | ICD-10-CM | POA: Diagnosis not present

## 2020-02-24 DIAGNOSIS — G6181 Chronic inflammatory demyelinating polyneuritis: Secondary | ICD-10-CM | POA: Diagnosis not present

## 2020-02-24 DIAGNOSIS — I10 Essential (primary) hypertension: Secondary | ICD-10-CM | POA: Diagnosis not present

## 2020-02-24 DIAGNOSIS — E7800 Pure hypercholesterolemia, unspecified: Secondary | ICD-10-CM | POA: Diagnosis not present

## 2020-02-24 DIAGNOSIS — Z9181 History of falling: Secondary | ICD-10-CM | POA: Diagnosis not present

## 2020-02-26 DIAGNOSIS — G6181 Chronic inflammatory demyelinating polyneuritis: Secondary | ICD-10-CM | POA: Diagnosis not present

## 2020-02-26 DIAGNOSIS — I10 Essential (primary) hypertension: Secondary | ICD-10-CM | POA: Diagnosis not present

## 2020-02-26 DIAGNOSIS — E7800 Pure hypercholesterolemia, unspecified: Secondary | ICD-10-CM | POA: Diagnosis not present

## 2020-02-26 DIAGNOSIS — Z7984 Long term (current) use of oral hypoglycemic drugs: Secondary | ICD-10-CM | POA: Diagnosis not present

## 2020-02-26 DIAGNOSIS — E1144 Type 2 diabetes mellitus with diabetic amyotrophy: Secondary | ICD-10-CM | POA: Diagnosis not present

## 2020-02-26 DIAGNOSIS — Z9181 History of falling: Secondary | ICD-10-CM | POA: Diagnosis not present

## 2020-02-27 DIAGNOSIS — Z7984 Long term (current) use of oral hypoglycemic drugs: Secondary | ICD-10-CM | POA: Diagnosis not present

## 2020-02-27 DIAGNOSIS — E7800 Pure hypercholesterolemia, unspecified: Secondary | ICD-10-CM | POA: Diagnosis not present

## 2020-02-27 DIAGNOSIS — I10 Essential (primary) hypertension: Secondary | ICD-10-CM | POA: Diagnosis not present

## 2020-02-27 DIAGNOSIS — Z9181 History of falling: Secondary | ICD-10-CM | POA: Diagnosis not present

## 2020-02-27 DIAGNOSIS — E1144 Type 2 diabetes mellitus with diabetic amyotrophy: Secondary | ICD-10-CM | POA: Diagnosis not present

## 2020-02-27 DIAGNOSIS — G6181 Chronic inflammatory demyelinating polyneuritis: Secondary | ICD-10-CM | POA: Diagnosis not present

## 2020-02-28 DIAGNOSIS — G6181 Chronic inflammatory demyelinating polyneuritis: Secondary | ICD-10-CM | POA: Diagnosis not present

## 2020-02-28 DIAGNOSIS — Z7984 Long term (current) use of oral hypoglycemic drugs: Secondary | ICD-10-CM | POA: Diagnosis not present

## 2020-02-28 DIAGNOSIS — Z9181 History of falling: Secondary | ICD-10-CM | POA: Diagnosis not present

## 2020-02-28 DIAGNOSIS — I10 Essential (primary) hypertension: Secondary | ICD-10-CM | POA: Diagnosis not present

## 2020-02-28 DIAGNOSIS — E7800 Pure hypercholesterolemia, unspecified: Secondary | ICD-10-CM | POA: Diagnosis not present

## 2020-02-28 DIAGNOSIS — E1144 Type 2 diabetes mellitus with diabetic amyotrophy: Secondary | ICD-10-CM | POA: Diagnosis not present

## 2020-02-29 DIAGNOSIS — G6181 Chronic inflammatory demyelinating polyneuritis: Secondary | ICD-10-CM | POA: Diagnosis not present

## 2020-03-01 DIAGNOSIS — G6181 Chronic inflammatory demyelinating polyneuritis: Secondary | ICD-10-CM | POA: Diagnosis not present

## 2020-03-06 ENCOUNTER — Telehealth: Payer: Self-pay | Admitting: Family Medicine

## 2020-03-06 NOTE — Telephone Encounter (Signed)
Patient stated him and his family had a stomach bug. He stated he feels better now.

## 2020-03-06 NOTE — Telephone Encounter (Signed)
Noted. Please follow-up with the patient to see how he is feeling and see what kind of illness he has. Thanks.

## 2020-03-06 NOTE — Telephone Encounter (Signed)
Noted  

## 2020-03-06 NOTE — Telephone Encounter (Signed)
Michael Montgomery with [facility] called to report a missed PT appt. Pt called him and states that he is sick and needed to miss today's visit

## 2020-03-09 DIAGNOSIS — Z9181 History of falling: Secondary | ICD-10-CM | POA: Diagnosis not present

## 2020-03-09 DIAGNOSIS — E7800 Pure hypercholesterolemia, unspecified: Secondary | ICD-10-CM | POA: Diagnosis not present

## 2020-03-09 DIAGNOSIS — G6181 Chronic inflammatory demyelinating polyneuritis: Secondary | ICD-10-CM | POA: Diagnosis not present

## 2020-03-09 DIAGNOSIS — Z7984 Long term (current) use of oral hypoglycemic drugs: Secondary | ICD-10-CM | POA: Diagnosis not present

## 2020-03-09 DIAGNOSIS — E1144 Type 2 diabetes mellitus with diabetic amyotrophy: Secondary | ICD-10-CM | POA: Diagnosis not present

## 2020-03-09 DIAGNOSIS — I10 Essential (primary) hypertension: Secondary | ICD-10-CM | POA: Diagnosis not present

## 2020-03-11 DIAGNOSIS — G6181 Chronic inflammatory demyelinating polyneuritis: Secondary | ICD-10-CM | POA: Diagnosis not present

## 2020-03-11 DIAGNOSIS — S39012D Strain of muscle, fascia and tendon of lower back, subsequent encounter: Secondary | ICD-10-CM | POA: Diagnosis not present

## 2020-03-11 DIAGNOSIS — M6281 Muscle weakness (generalized): Secondary | ICD-10-CM | POA: Diagnosis not present

## 2020-03-11 DIAGNOSIS — R531 Weakness: Secondary | ICD-10-CM | POA: Diagnosis not present

## 2020-03-11 DIAGNOSIS — M7542 Impingement syndrome of left shoulder: Secondary | ICD-10-CM | POA: Diagnosis not present

## 2020-03-12 ENCOUNTER — Telehealth: Payer: Self-pay

## 2020-03-12 DIAGNOSIS — G6181 Chronic inflammatory demyelinating polyneuritis: Secondary | ICD-10-CM | POA: Diagnosis not present

## 2020-03-12 DIAGNOSIS — M6281 Muscle weakness (generalized): Secondary | ICD-10-CM | POA: Diagnosis not present

## 2020-03-12 DIAGNOSIS — E1144 Type 2 diabetes mellitus with diabetic amyotrophy: Secondary | ICD-10-CM | POA: Diagnosis not present

## 2020-03-12 DIAGNOSIS — E785 Hyperlipidemia, unspecified: Secondary | ICD-10-CM | POA: Diagnosis not present

## 2020-03-12 DIAGNOSIS — S39012D Strain of muscle, fascia and tendon of lower back, subsequent encounter: Secondary | ICD-10-CM | POA: Diagnosis not present

## 2020-03-12 DIAGNOSIS — M7542 Impingement syndrome of left shoulder: Secondary | ICD-10-CM | POA: Diagnosis not present

## 2020-03-12 DIAGNOSIS — I10 Essential (primary) hypertension: Secondary | ICD-10-CM | POA: Diagnosis not present

## 2020-03-12 DIAGNOSIS — R262 Difficulty in walking, not elsewhere classified: Secondary | ICD-10-CM | POA: Diagnosis not present

## 2020-03-12 DIAGNOSIS — R531 Weakness: Secondary | ICD-10-CM | POA: Diagnosis not present

## 2020-03-12 DIAGNOSIS — L97519 Non-pressure chronic ulcer of other part of right foot with unspecified severity: Secondary | ICD-10-CM | POA: Diagnosis not present

## 2020-03-12 DIAGNOSIS — R29898 Other symptoms and signs involving the musculoskeletal system: Secondary | ICD-10-CM | POA: Diagnosis not present

## 2020-03-12 NOTE — Progress Notes (Signed)
LVM for the home health nurse at [facility] home health to call me back.  Keiarah Orlowski,cma

## 2020-03-12 NOTE — Progress Notes (Signed)
My apologies that this was missed, I called [facility] home [facility] and the nurse stated that his order is over, he no longer has home health but if the provider feels like he needs labs and more things he needs help with she will start a new order for the patient.  But they cannot open it for labs only.  I called the patient and he stated he is using another home health called [facility] and LVM for them to give me a call back to see if they can draw labs on the patient.  Doristine Shehan,cma Gustavia Carie,cma

## 2020-03-12 NOTE — Telephone Encounter (Signed)
-----   Message from Leone Haven, MD sent at 02/11/2020  3:57 PM EDT ----- Please call the [facility] home health and see if they have the ability to draw labs in the home of this patient. He uses their services so hopefully they can do this. I would like a CMET, TSH, and A1c for a diagnosis of weight loss and diabetes. Thanks.

## 2020-03-12 NOTE — Telephone Encounter (Signed)
I called the home health nurse to see if they could draw labs on this patient at home the provider wants a CMET, TSH and a A1C.  I lvm for the home health nurse to call me back.  Milburn Freeney,cma

## 2020-03-24 DIAGNOSIS — F4321 Adjustment disorder with depressed mood: Secondary | ICD-10-CM | POA: Diagnosis not present

## 2020-03-24 DIAGNOSIS — R4189 Other symptoms and signs involving cognitive functions and awareness: Secondary | ICD-10-CM | POA: Diagnosis not present

## 2020-03-24 DIAGNOSIS — G6181 Chronic inflammatory demyelinating polyneuritis: Secondary | ICD-10-CM | POA: Diagnosis not present

## 2020-04-01 DIAGNOSIS — F329 Major depressive disorder, single episode, unspecified: Secondary | ICD-10-CM | POA: Diagnosis not present

## 2020-04-01 DIAGNOSIS — I10 Essential (primary) hypertension: Secondary | ICD-10-CM | POA: Diagnosis not present

## 2020-04-01 DIAGNOSIS — G6181 Chronic inflammatory demyelinating polyneuritis: Secondary | ICD-10-CM | POA: Diagnosis not present

## 2020-04-01 DIAGNOSIS — Z8616 Personal history of COVID-19: Secondary | ICD-10-CM | POA: Diagnosis not present

## 2020-04-01 DIAGNOSIS — N529 Male erectile dysfunction, unspecified: Secondary | ICD-10-CM | POA: Diagnosis not present

## 2020-04-01 DIAGNOSIS — E7800 Pure hypercholesterolemia, unspecified: Secondary | ICD-10-CM | POA: Diagnosis not present

## 2020-04-01 DIAGNOSIS — M48061 Spinal stenosis, lumbar region without neurogenic claudication: Secondary | ICD-10-CM | POA: Diagnosis not present

## 2020-04-01 DIAGNOSIS — Z7984 Long term (current) use of oral hypoglycemic drugs: Secondary | ICD-10-CM | POA: Diagnosis not present

## 2020-04-01 DIAGNOSIS — E1144 Type 2 diabetes mellitus with diabetic amyotrophy: Secondary | ICD-10-CM | POA: Diagnosis not present

## 2020-04-02 DIAGNOSIS — E1144 Type 2 diabetes mellitus with diabetic amyotrophy: Secondary | ICD-10-CM | POA: Diagnosis not present

## 2020-04-02 DIAGNOSIS — Z7984 Long term (current) use of oral hypoglycemic drugs: Secondary | ICD-10-CM | POA: Diagnosis not present

## 2020-04-02 DIAGNOSIS — I10 Essential (primary) hypertension: Secondary | ICD-10-CM | POA: Diagnosis not present

## 2020-04-02 DIAGNOSIS — Z8616 Personal history of COVID-19: Secondary | ICD-10-CM | POA: Diagnosis not present

## 2020-04-02 DIAGNOSIS — M48061 Spinal stenosis, lumbar region without neurogenic claudication: Secondary | ICD-10-CM | POA: Diagnosis not present

## 2020-04-02 DIAGNOSIS — N529 Male erectile dysfunction, unspecified: Secondary | ICD-10-CM | POA: Diagnosis not present

## 2020-04-02 DIAGNOSIS — F329 Major depressive disorder, single episode, unspecified: Secondary | ICD-10-CM | POA: Diagnosis not present

## 2020-04-02 DIAGNOSIS — G6181 Chronic inflammatory demyelinating polyneuritis: Secondary | ICD-10-CM | POA: Diagnosis not present

## 2020-04-02 DIAGNOSIS — E7800 Pure hypercholesterolemia, unspecified: Secondary | ICD-10-CM | POA: Diagnosis not present

## 2020-04-04 DIAGNOSIS — F329 Major depressive disorder, single episode, unspecified: Secondary | ICD-10-CM | POA: Diagnosis not present

## 2020-04-04 DIAGNOSIS — E7800 Pure hypercholesterolemia, unspecified: Secondary | ICD-10-CM | POA: Diagnosis not present

## 2020-04-04 DIAGNOSIS — Z8616 Personal history of COVID-19: Secondary | ICD-10-CM | POA: Diagnosis not present

## 2020-04-04 DIAGNOSIS — I10 Essential (primary) hypertension: Secondary | ICD-10-CM | POA: Diagnosis not present

## 2020-04-04 DIAGNOSIS — Z7984 Long term (current) use of oral hypoglycemic drugs: Secondary | ICD-10-CM | POA: Diagnosis not present

## 2020-04-04 DIAGNOSIS — M48061 Spinal stenosis, lumbar region without neurogenic claudication: Secondary | ICD-10-CM | POA: Diagnosis not present

## 2020-04-04 DIAGNOSIS — N529 Male erectile dysfunction, unspecified: Secondary | ICD-10-CM | POA: Diagnosis not present

## 2020-04-04 DIAGNOSIS — G6181 Chronic inflammatory demyelinating polyneuritis: Secondary | ICD-10-CM | POA: Diagnosis not present

## 2020-04-04 DIAGNOSIS — E1144 Type 2 diabetes mellitus with diabetic amyotrophy: Secondary | ICD-10-CM | POA: Diagnosis not present

## 2020-04-08 ENCOUNTER — Ambulatory Visit: Payer: Medicare HMO | Admitting: Physical Therapy

## 2020-04-08 ENCOUNTER — Telehealth: Payer: Self-pay | Admitting: Family Medicine

## 2020-04-08 ENCOUNTER — Telehealth: Payer: Self-pay

## 2020-04-08 NOTE — Telephone Encounter (Signed)
It might be easier for his neurologist to determine if this is needed and to help arrange for this. I would suggest he contact his neurologist regarding this first.

## 2020-04-08 NOTE — Telephone Encounter (Signed)
How would we get this patient set up with a SNF? I think he would meet criteria. He has home health. Would it be best to have their social worker meet with him to start the process?

## 2020-04-08 NOTE — Telephone Encounter (Signed)
The patient states he wants an order to go back into Rehab because the nurses aides, physical therapy are not coming in to help him, He states his wife works and they were only coming in 2 times a week and he needs more therapy than that.  Lynnsie Linders,cma

## 2020-04-08 NOTE — Telephone Encounter (Signed)
Yes social worker would have to start the process or his family would have to find a facility or the only other way is direct admit from the hospital.

## 2020-04-08 NOTE — Telephone Encounter (Signed)
Caller states, still having someone come to home for rehab. but it's not working out. Would like to go back to rehab. center. Will be better off, and will be able to become independent sooner     [facility] Nonclinical Telephone Record  [facility] Client [facility] Client Site [facility] - Day Physician [name] - MD Contact Type Call Who Is Calling Patient / Member / Family / Caregiver Caller Name [facility] Phone Number [phone] Patient Name Michael Montgomery Patient DOB [birth year] Call Type Message Only Information Provided Reason for Call Request for General Office Information Initial Comment Caller states, still having someone come to home for rehab. but it's not working out. Would like to go back to rehab. center. Will be better off, and will be able to become independent sooner. Additional Comment Disp. Time Disposition Final User 04/07/2020 5:43:45 PM General Information Provided Yes Jobie Quaker Call Closed By: Jobie Quaker Transaction Date/Time: 04/07/2020 5:39:21 PM (ET)

## 2020-04-08 NOTE — Telephone Encounter (Signed)
Patient has been informed.

## 2020-04-08 NOTE — Telephone Encounter (Signed)
Pt called in and stated that he need a order to go into rehab.

## 2020-04-08 NOTE — Telephone Encounter (Signed)
Patient stated that he did reach out and the neurologist stated he could not do it because he does not do FL2 forms.  Michael Montgomery,cma

## 2020-04-08 NOTE — Telephone Encounter (Signed)
Did he try reaching out to his neurologist as we previously advised?

## 2020-04-09 NOTE — Telephone Encounter (Signed)
I called and LVM for the nurse to call me to give a verbal order for a social worker to go out and see the patient to start the process of Rehab per patient request.  Todd Jelinski,cma

## 2020-04-09 NOTE — Telephone Encounter (Signed)
Please call his home health company and see if they can have a Education officer, museum go out to discuss this with him to start this process. Please see if they can take verbal orders for this.

## 2020-04-11 DIAGNOSIS — G6181 Chronic inflammatory demyelinating polyneuritis: Secondary | ICD-10-CM | POA: Diagnosis not present

## 2020-04-11 DIAGNOSIS — M6281 Muscle weakness (generalized): Secondary | ICD-10-CM | POA: Diagnosis not present

## 2020-04-11 DIAGNOSIS — S39012D Strain of muscle, fascia and tendon of lower back, subsequent encounter: Secondary | ICD-10-CM | POA: Diagnosis not present

## 2020-04-11 DIAGNOSIS — M7542 Impingement syndrome of left shoulder: Secondary | ICD-10-CM | POA: Diagnosis not present

## 2020-04-11 DIAGNOSIS — R531 Weakness: Secondary | ICD-10-CM | POA: Diagnosis not present

## 2020-04-12 DIAGNOSIS — E785 Hyperlipidemia, unspecified: Secondary | ICD-10-CM | POA: Diagnosis not present

## 2020-04-12 DIAGNOSIS — L97519 Non-pressure chronic ulcer of other part of right foot with unspecified severity: Secondary | ICD-10-CM | POA: Diagnosis not present

## 2020-04-12 DIAGNOSIS — G6181 Chronic inflammatory demyelinating polyneuritis: Secondary | ICD-10-CM | POA: Diagnosis not present

## 2020-04-12 DIAGNOSIS — R29898 Other symptoms and signs involving the musculoskeletal system: Secondary | ICD-10-CM | POA: Diagnosis not present

## 2020-04-12 DIAGNOSIS — E1144 Type 2 diabetes mellitus with diabetic amyotrophy: Secondary | ICD-10-CM | POA: Diagnosis not present

## 2020-04-12 DIAGNOSIS — M7542 Impingement syndrome of left shoulder: Secondary | ICD-10-CM | POA: Diagnosis not present

## 2020-04-12 DIAGNOSIS — I10 Essential (primary) hypertension: Secondary | ICD-10-CM | POA: Diagnosis not present

## 2020-04-12 DIAGNOSIS — S39012D Strain of muscle, fascia and tendon of lower back, subsequent encounter: Secondary | ICD-10-CM | POA: Diagnosis not present

## 2020-04-21 DIAGNOSIS — N529 Male erectile dysfunction, unspecified: Secondary | ICD-10-CM | POA: Diagnosis not present

## 2020-04-21 DIAGNOSIS — E1144 Type 2 diabetes mellitus with diabetic amyotrophy: Secondary | ICD-10-CM | POA: Diagnosis not present

## 2020-04-21 DIAGNOSIS — F329 Major depressive disorder, single episode, unspecified: Secondary | ICD-10-CM | POA: Diagnosis not present

## 2020-04-21 DIAGNOSIS — G6181 Chronic inflammatory demyelinating polyneuritis: Secondary | ICD-10-CM | POA: Diagnosis not present

## 2020-04-21 DIAGNOSIS — E7800 Pure hypercholesterolemia, unspecified: Secondary | ICD-10-CM | POA: Diagnosis not present

## 2020-04-21 DIAGNOSIS — Z8616 Personal history of COVID-19: Secondary | ICD-10-CM | POA: Diagnosis not present

## 2020-04-21 DIAGNOSIS — I10 Essential (primary) hypertension: Secondary | ICD-10-CM | POA: Diagnosis not present

## 2020-04-21 DIAGNOSIS — M48061 Spinal stenosis, lumbar region without neurogenic claudication: Secondary | ICD-10-CM | POA: Diagnosis not present

## 2020-04-21 DIAGNOSIS — Z7984 Long term (current) use of oral hypoglycemic drugs: Secondary | ICD-10-CM | POA: Diagnosis not present

## 2020-04-23 NOTE — Telephone Encounter (Signed)
Nurse never returned my call.  Nor did the patient.  Michael Montgomery,cma

## 2020-04-24 ENCOUNTER — Other Ambulatory Visit: Payer: Self-pay | Admitting: Family Medicine

## 2020-04-27 DIAGNOSIS — I10 Essential (primary) hypertension: Secondary | ICD-10-CM | POA: Diagnosis not present

## 2020-04-27 DIAGNOSIS — N529 Male erectile dysfunction, unspecified: Secondary | ICD-10-CM | POA: Diagnosis not present

## 2020-04-27 DIAGNOSIS — E7800 Pure hypercholesterolemia, unspecified: Secondary | ICD-10-CM | POA: Diagnosis not present

## 2020-04-27 DIAGNOSIS — M48061 Spinal stenosis, lumbar region without neurogenic claudication: Secondary | ICD-10-CM | POA: Diagnosis not present

## 2020-04-27 DIAGNOSIS — E1144 Type 2 diabetes mellitus with diabetic amyotrophy: Secondary | ICD-10-CM | POA: Diagnosis not present

## 2020-04-27 DIAGNOSIS — G6181 Chronic inflammatory demyelinating polyneuritis: Secondary | ICD-10-CM | POA: Diagnosis not present

## 2020-04-27 DIAGNOSIS — F329 Major depressive disorder, single episode, unspecified: Secondary | ICD-10-CM | POA: Diagnosis not present

## 2020-04-27 DIAGNOSIS — Z8616 Personal history of COVID-19: Secondary | ICD-10-CM | POA: Diagnosis not present

## 2020-04-27 DIAGNOSIS — Z7984 Long term (current) use of oral hypoglycemic drugs: Secondary | ICD-10-CM | POA: Diagnosis not present

## 2020-04-28 DIAGNOSIS — G6181 Chronic inflammatory demyelinating polyneuritis: Secondary | ICD-10-CM | POA: Diagnosis not present

## 2020-04-29 DIAGNOSIS — G6181 Chronic inflammatory demyelinating polyneuritis: Secondary | ICD-10-CM | POA: Diagnosis not present

## 2020-04-30 DIAGNOSIS — G6181 Chronic inflammatory demyelinating polyneuritis: Secondary | ICD-10-CM | POA: Diagnosis not present

## 2020-05-01 DIAGNOSIS — G6181 Chronic inflammatory demyelinating polyneuritis: Secondary | ICD-10-CM | POA: Diagnosis not present

## 2020-05-04 DIAGNOSIS — Z8616 Personal history of COVID-19: Secondary | ICD-10-CM | POA: Diagnosis not present

## 2020-05-04 DIAGNOSIS — I10 Essential (primary) hypertension: Secondary | ICD-10-CM | POA: Diagnosis not present

## 2020-05-04 DIAGNOSIS — M48061 Spinal stenosis, lumbar region without neurogenic claudication: Secondary | ICD-10-CM | POA: Diagnosis not present

## 2020-05-04 DIAGNOSIS — E7800 Pure hypercholesterolemia, unspecified: Secondary | ICD-10-CM | POA: Diagnosis not present

## 2020-05-04 DIAGNOSIS — G6181 Chronic inflammatory demyelinating polyneuritis: Secondary | ICD-10-CM | POA: Diagnosis not present

## 2020-05-04 DIAGNOSIS — E1144 Type 2 diabetes mellitus with diabetic amyotrophy: Secondary | ICD-10-CM | POA: Diagnosis not present

## 2020-05-04 DIAGNOSIS — Z7984 Long term (current) use of oral hypoglycemic drugs: Secondary | ICD-10-CM | POA: Diagnosis not present

## 2020-05-04 DIAGNOSIS — F329 Major depressive disorder, single episode, unspecified: Secondary | ICD-10-CM | POA: Diagnosis not present

## 2020-05-04 DIAGNOSIS — N529 Male erectile dysfunction, unspecified: Secondary | ICD-10-CM | POA: Diagnosis not present

## 2020-05-08 ENCOUNTER — Telehealth: Payer: Self-pay | Admitting: Family Medicine

## 2020-05-08 ENCOUNTER — Other Ambulatory Visit: Payer: Self-pay | Admitting: Family Medicine

## 2020-05-08 DIAGNOSIS — E7800 Pure hypercholesterolemia, unspecified: Secondary | ICD-10-CM | POA: Diagnosis not present

## 2020-05-08 DIAGNOSIS — F329 Major depressive disorder, single episode, unspecified: Secondary | ICD-10-CM | POA: Diagnosis not present

## 2020-05-08 DIAGNOSIS — I10 Essential (primary) hypertension: Secondary | ICD-10-CM | POA: Diagnosis not present

## 2020-05-08 DIAGNOSIS — Z7984 Long term (current) use of oral hypoglycemic drugs: Secondary | ICD-10-CM | POA: Diagnosis not present

## 2020-05-08 DIAGNOSIS — G6181 Chronic inflammatory demyelinating polyneuritis: Secondary | ICD-10-CM | POA: Diagnosis not present

## 2020-05-08 DIAGNOSIS — N529 Male erectile dysfunction, unspecified: Secondary | ICD-10-CM | POA: Diagnosis not present

## 2020-05-08 DIAGNOSIS — E1144 Type 2 diabetes mellitus with diabetic amyotrophy: Secondary | ICD-10-CM | POA: Diagnosis not present

## 2020-05-08 DIAGNOSIS — M48061 Spinal stenosis, lumbar region without neurogenic claudication: Secondary | ICD-10-CM | POA: Diagnosis not present

## 2020-05-08 DIAGNOSIS — Z8616 Personal history of COVID-19: Secondary | ICD-10-CM | POA: Diagnosis not present

## 2020-05-08 NOTE — Telephone Encounter (Signed)
I spoke with pt the Social worker is from [facility], Olga Millers [phone].

## 2020-05-08 NOTE — Telephone Encounter (Signed)
Social work has to be involved for him to get in to a facility. Please see if his home health company has a Education officer, museum that can meet with him to start the process of SNF placement.

## 2020-05-08 NOTE — Telephone Encounter (Signed)
PT called in about his rehab referral never received a call about it

## 2020-05-11 DIAGNOSIS — M7542 Impingement syndrome of left shoulder: Secondary | ICD-10-CM | POA: Diagnosis not present

## 2020-05-11 DIAGNOSIS — S39012D Strain of muscle, fascia and tendon of lower back, subsequent encounter: Secondary | ICD-10-CM | POA: Diagnosis not present

## 2020-05-11 DIAGNOSIS — M6281 Muscle weakness (generalized): Secondary | ICD-10-CM | POA: Diagnosis not present

## 2020-05-11 DIAGNOSIS — R531 Weakness: Secondary | ICD-10-CM | POA: Diagnosis not present

## 2020-05-11 DIAGNOSIS — G6181 Chronic inflammatory demyelinating polyneuritis: Secondary | ICD-10-CM | POA: Diagnosis not present

## 2020-05-11 NOTE — Telephone Encounter (Signed)
I called the patient's social worker and left a VM for him to call me back to inform him that the patient is needing a ramp built at his home and what steps need to be taken for this.  Shine Mikes,cma

## 2020-05-12 DIAGNOSIS — S39012D Strain of muscle, fascia and tendon of lower back, subsequent encounter: Secondary | ICD-10-CM | POA: Diagnosis not present

## 2020-05-12 DIAGNOSIS — G6181 Chronic inflammatory demyelinating polyneuritis: Secondary | ICD-10-CM | POA: Diagnosis not present

## 2020-05-12 DIAGNOSIS — M7542 Impingement syndrome of left shoulder: Secondary | ICD-10-CM | POA: Diagnosis not present

## 2020-05-12 DIAGNOSIS — E1144 Type 2 diabetes mellitus with diabetic amyotrophy: Secondary | ICD-10-CM | POA: Diagnosis not present

## 2020-05-12 DIAGNOSIS — I10 Essential (primary) hypertension: Secondary | ICD-10-CM | POA: Diagnosis not present

## 2020-05-12 DIAGNOSIS — R29898 Other symptoms and signs involving the musculoskeletal system: Secondary | ICD-10-CM | POA: Diagnosis not present

## 2020-05-12 DIAGNOSIS — E785 Hyperlipidemia, unspecified: Secondary | ICD-10-CM | POA: Diagnosis not present

## 2020-05-12 DIAGNOSIS — L97519 Non-pressure chronic ulcer of other part of right foot with unspecified severity: Secondary | ICD-10-CM | POA: Diagnosis not present

## 2020-05-12 NOTE — Telephone Encounter (Signed)
I called and left another VM for this social worker to call me about moshe wenger having a ramp built at his home.  Loras Grieshop,cma

## 2020-05-14 DIAGNOSIS — G6181 Chronic inflammatory demyelinating polyneuritis: Secondary | ICD-10-CM | POA: Diagnosis not present

## 2020-05-14 DIAGNOSIS — N529 Male erectile dysfunction, unspecified: Secondary | ICD-10-CM | POA: Diagnosis not present

## 2020-05-14 DIAGNOSIS — Z7984 Long term (current) use of oral hypoglycemic drugs: Secondary | ICD-10-CM | POA: Diagnosis not present

## 2020-05-14 DIAGNOSIS — E1144 Type 2 diabetes mellitus with diabetic amyotrophy: Secondary | ICD-10-CM | POA: Diagnosis not present

## 2020-05-14 DIAGNOSIS — M48061 Spinal stenosis, lumbar region without neurogenic claudication: Secondary | ICD-10-CM | POA: Diagnosis not present

## 2020-05-14 DIAGNOSIS — F329 Major depressive disorder, single episode, unspecified: Secondary | ICD-10-CM | POA: Diagnosis not present

## 2020-05-14 DIAGNOSIS — E7800 Pure hypercholesterolemia, unspecified: Secondary | ICD-10-CM | POA: Diagnosis not present

## 2020-05-14 DIAGNOSIS — Z8616 Personal history of COVID-19: Secondary | ICD-10-CM | POA: Diagnosis not present

## 2020-05-14 DIAGNOSIS — I10 Essential (primary) hypertension: Secondary | ICD-10-CM | POA: Diagnosis not present

## 2020-05-18 NOTE — Telephone Encounter (Signed)
Called twice and no one answers.  Morad Tal,cma

## 2020-05-22 DIAGNOSIS — Z8616 Personal history of COVID-19: Secondary | ICD-10-CM | POA: Diagnosis not present

## 2020-05-22 DIAGNOSIS — E7800 Pure hypercholesterolemia, unspecified: Secondary | ICD-10-CM | POA: Diagnosis not present

## 2020-05-22 DIAGNOSIS — N529 Male erectile dysfunction, unspecified: Secondary | ICD-10-CM | POA: Diagnosis not present

## 2020-05-22 DIAGNOSIS — F329 Major depressive disorder, single episode, unspecified: Secondary | ICD-10-CM | POA: Diagnosis not present

## 2020-05-22 DIAGNOSIS — Z7984 Long term (current) use of oral hypoglycemic drugs: Secondary | ICD-10-CM | POA: Diagnosis not present

## 2020-05-22 DIAGNOSIS — E1144 Type 2 diabetes mellitus with diabetic amyotrophy: Secondary | ICD-10-CM | POA: Diagnosis not present

## 2020-05-22 DIAGNOSIS — I10 Essential (primary) hypertension: Secondary | ICD-10-CM | POA: Diagnosis not present

## 2020-05-22 DIAGNOSIS — M48061 Spinal stenosis, lumbar region without neurogenic claudication: Secondary | ICD-10-CM | POA: Diagnosis not present

## 2020-05-22 DIAGNOSIS — G6181 Chronic inflammatory demyelinating polyneuritis: Secondary | ICD-10-CM | POA: Diagnosis not present

## 2020-05-25 ENCOUNTER — Telehealth: Payer: Self-pay

## 2020-05-25 ENCOUNTER — Other Ambulatory Visit: Payer: Self-pay | Admitting: Family Medicine

## 2020-05-25 ENCOUNTER — Ambulatory Visit: Payer: Medicare HMO | Admitting: Physical Therapy

## 2020-05-25 NOTE — Telephone Encounter (Signed)
Please call the patients home health company to see if they can have a Education officer, museum come out to see him to help on this. I think it would be reasonable for him to go to a SNF for skilled help.

## 2020-05-25 NOTE — Telephone Encounter (Signed)
Caller states he is supposed to have someone coming in to help him get to be more independent, but sometimes they come and sometimes they don't. He would like to go to an inpatient rehab to get help every day to move forward. He feels like he is getting weaker because nothing is consistent. Can this be ordered      [facility] Nonclinical Telephone Record  [facility] Client [facility] Night - Cl Client Site [facility] Physician [name] - MD Contact Type Call Who Is Calling Patient / Member / Family / Caregiver Caller Name [facility] Phone Number [phone] Patient Name Michael Montgomery Patient DOB 12-13-1960 Call Type Message Only Information Provided Reason for Call Request for General Office Information Initial Comment Caller states he is supposed to have someone coming in to help him get to be more independent, but sometimes they come and sometimes they don't. He would like to go to an inpatient rehab to get help every day to move forward. He feels like he is getting weaker because nothing is consistent. Can this be ordered Disp. Time Disposition Final User 05/23/2020 9:36:03 AM General Information Provided Yes Kathlynn Grate Call Closed By: Kathlynn Grate Transaction Date/Time: 05/23/2020 9:31:53 AM (ET)

## 2020-05-26 ENCOUNTER — Telehealth: Payer: Self-pay | Admitting: Family Medicine

## 2020-05-26 NOTE — Telephone Encounter (Signed)
I called and spoke with the patient and explained to him that in order to get rehab he has to get it through his social worker I gave him the social workers name and number to call.  Jereld Presti,cma

## 2020-05-26 NOTE — Telephone Encounter (Signed)
Pt called in stated that he  Is taking LINZESS 72 MCG capsule thinks this is causing he not to have a bile movement

## 2020-05-26 NOTE — Telephone Encounter (Signed)
Pt called in stated that he  Is taking LINZESS 72 MCG capsule thinks this is causing he not to have a bile movement.  Elaisha Zahniser,cma

## 2020-05-27 ENCOUNTER — Other Ambulatory Visit: Payer: Self-pay | Admitting: Family Medicine

## 2020-05-27 NOTE — Telephone Encounter (Signed)
Noted. The linzess should be helpful in having a BM. We could try miralax to see if that would provide benefit.

## 2020-05-27 NOTE — Telephone Encounter (Signed)
I called and spoke with the patient and he stated the last time he had a bowel movement was about 5 days ago, he states his stomach will cramp like he has to go but when he goes he does nothing, no other symptoms.  Yariela Tison,cma

## 2020-05-27 NOTE — Telephone Encounter (Signed)
Please get details on his constipation.  When was last time he had a bowel movement?  Is he having any other symptoms?

## 2020-06-01 ENCOUNTER — Other Ambulatory Visit: Payer: Self-pay | Admitting: Family Medicine

## 2020-06-01 NOTE — Telephone Encounter (Signed)
Spoke with patient and he is still having some issues with his stomach and bowel movements. I advised to try miralax as advised by Dr. Caryl Bis; patient agreed to try this. I advised patient if this does not help to let our office know.

## 2020-06-11 DIAGNOSIS — R531 Weakness: Secondary | ICD-10-CM | POA: Diagnosis not present

## 2020-06-11 DIAGNOSIS — G6181 Chronic inflammatory demyelinating polyneuritis: Secondary | ICD-10-CM | POA: Diagnosis not present

## 2020-06-11 DIAGNOSIS — M6281 Muscle weakness (generalized): Secondary | ICD-10-CM | POA: Diagnosis not present

## 2020-06-11 DIAGNOSIS — S39012D Strain of muscle, fascia and tendon of lower back, subsequent encounter: Secondary | ICD-10-CM | POA: Diagnosis not present

## 2020-06-11 DIAGNOSIS — M7542 Impingement syndrome of left shoulder: Secondary | ICD-10-CM | POA: Diagnosis not present

## 2020-06-12 DIAGNOSIS — R29898 Other symptoms and signs involving the musculoskeletal system: Secondary | ICD-10-CM | POA: Diagnosis not present

## 2020-06-12 DIAGNOSIS — E785 Hyperlipidemia, unspecified: Secondary | ICD-10-CM | POA: Diagnosis not present

## 2020-06-12 DIAGNOSIS — S39012D Strain of muscle, fascia and tendon of lower back, subsequent encounter: Secondary | ICD-10-CM | POA: Diagnosis not present

## 2020-06-12 DIAGNOSIS — M7542 Impingement syndrome of left shoulder: Secondary | ICD-10-CM | POA: Diagnosis not present

## 2020-06-12 DIAGNOSIS — I10 Essential (primary) hypertension: Secondary | ICD-10-CM | POA: Diagnosis not present

## 2020-06-12 DIAGNOSIS — L97519 Non-pressure chronic ulcer of other part of right foot with unspecified severity: Secondary | ICD-10-CM | POA: Diagnosis not present

## 2020-06-12 DIAGNOSIS — G6181 Chronic inflammatory demyelinating polyneuritis: Secondary | ICD-10-CM | POA: Diagnosis not present

## 2020-06-12 DIAGNOSIS — E1144 Type 2 diabetes mellitus with diabetic amyotrophy: Secondary | ICD-10-CM | POA: Diagnosis not present

## 2020-06-20 ENCOUNTER — Other Ambulatory Visit: Payer: Self-pay | Admitting: Family Medicine

## 2020-06-23 DIAGNOSIS — G6181 Chronic inflammatory demyelinating polyneuritis: Secondary | ICD-10-CM | POA: Diagnosis not present

## 2020-06-24 ENCOUNTER — Other Ambulatory Visit: Payer: Self-pay | Admitting: Family Medicine

## 2020-06-24 DIAGNOSIS — G6181 Chronic inflammatory demyelinating polyneuritis: Secondary | ICD-10-CM | POA: Diagnosis not present

## 2020-06-25 DIAGNOSIS — G6181 Chronic inflammatory demyelinating polyneuritis: Secondary | ICD-10-CM | POA: Diagnosis not present

## 2020-06-26 DIAGNOSIS — G6181 Chronic inflammatory demyelinating polyneuritis: Secondary | ICD-10-CM | POA: Diagnosis not present

## 2020-07-12 DIAGNOSIS — M7542 Impingement syndrome of left shoulder: Secondary | ICD-10-CM | POA: Diagnosis not present

## 2020-07-12 DIAGNOSIS — G6181 Chronic inflammatory demyelinating polyneuritis: Secondary | ICD-10-CM | POA: Diagnosis not present

## 2020-07-12 DIAGNOSIS — S39012D Strain of muscle, fascia and tendon of lower back, subsequent encounter: Secondary | ICD-10-CM | POA: Diagnosis not present

## 2020-07-12 DIAGNOSIS — R531 Weakness: Secondary | ICD-10-CM | POA: Diagnosis not present

## 2020-07-12 DIAGNOSIS — M6281 Muscle weakness (generalized): Secondary | ICD-10-CM | POA: Diagnosis not present

## 2020-07-13 ENCOUNTER — Ambulatory Visit (INDEPENDENT_AMBULATORY_CARE_PROVIDER_SITE_OTHER): Payer: Medicare HMO

## 2020-07-13 VITALS — Ht 70.0 in | Wt 158.0 lb

## 2020-07-13 DIAGNOSIS — G6181 Chronic inflammatory demyelinating polyneuritis: Secondary | ICD-10-CM | POA: Diagnosis not present

## 2020-07-13 DIAGNOSIS — Z0000 Encounter for general adult medical examination without abnormal findings: Secondary | ICD-10-CM | POA: Diagnosis not present

## 2020-07-13 DIAGNOSIS — R29898 Other symptoms and signs involving the musculoskeletal system: Secondary | ICD-10-CM | POA: Diagnosis not present

## 2020-07-13 DIAGNOSIS — L97519 Non-pressure chronic ulcer of other part of right foot with unspecified severity: Secondary | ICD-10-CM | POA: Diagnosis not present

## 2020-07-13 DIAGNOSIS — E1144 Type 2 diabetes mellitus with diabetic amyotrophy: Secondary | ICD-10-CM | POA: Diagnosis not present

## 2020-07-13 DIAGNOSIS — I10 Essential (primary) hypertension: Secondary | ICD-10-CM | POA: Diagnosis not present

## 2020-07-13 DIAGNOSIS — S39012D Strain of muscle, fascia and tendon of lower back, subsequent encounter: Secondary | ICD-10-CM | POA: Diagnosis not present

## 2020-07-13 DIAGNOSIS — E785 Hyperlipidemia, unspecified: Secondary | ICD-10-CM | POA: Diagnosis not present

## 2020-07-13 DIAGNOSIS — M7542 Impingement syndrome of left shoulder: Secondary | ICD-10-CM | POA: Diagnosis not present

## 2020-07-13 NOTE — Progress Notes (Signed)
Subjective:   Michael Montgomery is a 59 y.o. male who presents for Medicare Annual/Subsequent preventive examination.  Review of Systems    No ROS.  Medicare Wellness Virtual Visit.    Cardiac Risk Factors include: advanced age (>60men, >92 women);male gender;hypertension;diabetes mellitus     Objective:    Today's Vitals   07/13/20 1109  Weight: 158 lb (71.7 kg)  Height: 5\' 10"  (1.778 m)   Body mass index is 22.67 kg/m.  Advanced Directives 07/13/2020 10/10/2019 08/26/2019 07/11/2019 04/19/2019 10/04/2016 10/04/2016  Does Patient Have a Medical Advance Directive? No No No No No No;Yes No  Type of Advance Directive - - - - - Press photographer;Living will -  Would patient like information on creating a medical advance directive? Yes (MAU/Ambulatory/Procedural Areas - Information given) No - Patient declined No - Guardian declined Yes (MAU/Ambulatory/Procedural Areas - Information given) - No - Patient declined -  Some encounter information is confidential and restricted. Go to Review Flowsheets activity to see all data.    Current Medications (verified) Outpatient Encounter Medications as of 07/13/2020  Medication Sig  . albuterol (VENTOLIN HFA) 108 (90 Base) MCG/ACT inhaler Inhale 2-4 puffs by mouth every 4 hours as needed for wheezing, cough, and/or shortness of breath  . amLODipine (NORVASC) 10 MG tablet TAKE 1 TABLET BY MOUTH EVERY DAY  . atorvastatin (LIPITOR) 40 MG tablet TAKE 1 TABLET BY MOUTH DAILY  . clomiPHENE (CLOMID) 50 MG tablet Take 1/2 tablet daily  . escitalopram (LEXAPRO) 20 MG tablet Take 20 mg by mouth daily.  . fluticasone (FLONASE) 50 MCG/ACT nasal spray Place 2 sprays into both nostrils daily.  Marland Kitchen gabapentin (NEURONTIN) 300 MG capsule Take 1 capsule by mouth 2 (two) times daily. Take 1 cap in AM and 2 caps at bedtime  . GAMUNEX-C 20 GM/200ML SOLN   . JARDIANCE 10 MG TABS tablet TAKE 1 TABLET BY MOUTH DAILY BEFORE BREAKFAST  . LINZESS 72 MCG capsule  TAKE 1 CAPSULE (72 MCG TOTAL) BY MOUTH DAILY BEFORE BREAKFAST.  Marland Kitchen loratadine (CLARITIN) 10 MG tablet TAKE 1 TABLET EVERY DAY  . losartan (COZAAR) 100 MG tablet TAKE 1 TABLET BY MOUTH EVERY DAY (Patient taking differently: Take 100 mg by mouth daily. )  . metFORMIN (GLUCOPHAGE-XR) 500 MG 24 hr tablet TAKE 3 TABLETS (1,500 MG TOTAL) BY MOUTH DAILY WITH BREAKFAST.  . Multiple Vitamin (MULTIVITAMIN) capsule Take 1 capsule by mouth daily.  . Nutritional Supplements (ENSURE ACTIVE HIGH PROTEIN) LIQD Take 1 Can by mouth 3 (three) times daily as needed.  . Omega-3 Fatty Acids (FISH OIL PO) Take 1 capsule by mouth daily.   Marland Kitchen omeprazole (PRILOSEC) 20 MG capsule TAKE 1 CAPSULE BY MOUTH EVERY DAY  . ondansetron (ZOFRAN ODT) 4 MG disintegrating tablet Allow 1-2 tablets to dissolve in your mouth every 8 hours as needed for nausea/vomiting  . sildenafil (VIAGRA) 50 MG tablet Take 50 mg by mouth as needed for erectile dysfunction.   . [DISCONTINUED] predniSONE (DELTASONE) 10 MG tablet Take 50 mg daily on day 1 and 2 Take 40 mg daily on day 3 and 4 Take 30 mg daily on day 5 and 6 Take 20 mg daily on day 7 and 8 Take 10 mg daily on day 9 and 10   No facility-administered encounter medications on file as of 07/13/2020.    Allergies (verified) Patient has no known allergies.   History: Past Medical History:  Diagnosis Date  . Cataract march, april   bilateral  removed   . Chickenpox   . Diabetes mellitus without complication (HCC)    metformin   . 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 Gastric ulcer   . Heart murmur    When he was younger, no recent issues  . High blood pressure    controlled with meds  . Hypercholesteremia    controlled with medication  . Knee injury    left  . Motion sickness    boats  . Neuropathy    Past Surgical History:  Procedure Laterality Date  . CATARACT EXTRACTION W/PHACO Right 02/01/2016   Procedure: CATARACT EXTRACTION PHACO AND INTRAOCULAR LENS  PLACEMENT ([facility]) right;  Surgeon: Ronnell Freshwater, MD;  Location: [facility];  Service: Ophthalmology;  Laterality: Right;  DIABETIC - oral meds  . CATARACT EXTRACTION W/PHACO Left 03/07/2016   Procedure: CATARACT EXTRACTION PHACO AND INTRAOCULAR LENS PLACEMENT ([facility]);  Surgeon: Ronnell Freshwater, MD;  Location: [facility];  Service: Ophthalmology;  Laterality: Left;  DIABETIC - oral meds   . circumscision     Family History  Problem Relation Age of Onset  . Alcoholism Father   . Hyperlipidemia Father   . Hypertension Father   . Throat cancer Father        smoked but had stopped 20 yeras before dx  . Diabetes Mother   . Emphysema Mother   . Kidney disease Neg Hx   . Prostate cancer Neg Hx   . Colon cancer Neg Hx   . Colon polyps Neg Hx   . Esophageal cancer Neg Hx   . Rectal cancer Neg Hx   . Stomach cancer Neg Hx   . Kidney cancer Neg Hx   . Bladder Cancer Neg Hx    Social History   Socioeconomic History  . Marital status: Married    Spouse name: Not on file  . Number of children: Not on file  . Years of education: Not on file  . Highest education level: Not on file  Occupational History  . Not on file  Tobacco Use  . Smoking status: Former Smoker    Types: Cigars  . Smokeless tobacco: Never Used  . Tobacco comment: one cigar once a month to once every 2 months   Vaping Use  . Vaping Use: Never used  Substance and Sexual Activity  . Alcohol use: Yes    Alcohol/week: 5.0 standard drinks    Types: 5 Shots of liquor per week    Comment: pt stated " every once in a while"  . Drug use: No  . Sexual activity: Not on file  Other Topics Concern  . Not on file  Social History Narrative  . Not on file   Social Determinants of Health   Financial Resource Strain: Low Risk   . Difficulty of Paying Living Expenses: Not hard at all  Food Insecurity: No Food Insecurity  . Worried About Charity fundraiser in the Last Year: Never true  . Ran  Out of Food in the Last Year: Never true  Transportation Needs: No Transportation Needs  . Lack of Transportation (Medical): No  . Lack of Transportation (Non-Medical): No  Physical Activity:   . Days of Exercise per Week: Not on file  . Minutes of Exercise per Session: Not on file  Stress: No Stress Concern Present  . Feeling of Stress : Not at all  Social Connections:   . Frequency of Communication with Friends and Family:  Not on file  . Frequency of Social Gatherings with Friends and Family: Not on file  . Attends Religious Services: Not on file  . Active Member of Clubs or Organizations: Not on file  . Attends Archivist Meetings: Not on file  . Marital Status: Not on file    Tobacco Counseling Counseling given: Not Answered Comment: one cigar once a month to once every 2 months    Clinical Intake:  Pre-visit preparation completed: Yes        Diabetes: Yes  How often do you need to have someone help you when you read instructions, pamphlets, or other written materials from your doctor or pharmacy?: 1 - Never  Interpreter Needed?: No      Activities of Daily Living In your present state of health, do you have any difficulty performing the following activities: 07/13/2020 08/14/2019  Hearing? N N  Vision? N N  Difficulty concentrating or making decisions? N N  Walking or climbing stairs? Y Y  Dressing or bathing? Y N  Doing errands, shopping? Y -  Conservation officer, nature and eating ? Y -  Using the Toilet? Y -  In the past six months, have you accidently leaked urine? N -  Do you have problems with loss of bowel control? N -  Managing your Medications? N -  Managing your Finances? N -  Housekeeping or managing your Housekeeping? Y -  Some recent data might be hidden    Patient Care Team: Leone Haven, MD as PCP - General (Family Medicine)  Indicate any recent Medical Services you may have received from other than [facility] providers in the past year  (date may be approximate).     Assessment:   This is a routine wellness examination for Michael Montgomery.  I connected with Michael Montgomery today by telephone and verified that I am speaking with the correct person using two identifiers. Location patient: home Location provider: work Persons participating in the virtual visit: patient, Marine scientist.    I discussed the limitations, risks, security and privacy concerns of performing an evaluation and management service by telephone and the availability of in person appointments. The patient expressed understanding and verbally consented to this telephonic visit.    Interactive audio and video telecommunications were attempted between this provider and patient, however failed, due to patient having technical difficulties OR patient did not have access to video capability.  We continued and completed visit with audio only.  Some vital signs may be absent or patient reported.   Hearing/Vision screen  Hearing Screening   125Hz  250Hz  500Hz  1000Hz  2000Hz  3000Hz  4000Hz  6000Hz  8000Hz   Right ear:           Left ear:           Comments: Patient is able to hear conversational tones without difficulty.  No issues reported.   Vision Screening Comments: Wears corrective lenses Visual acuity not assessed, virtual visit.  They have seen their ophthalmologist in the last 12 months.     Dietary issues and exercise activities discussed: Current Exercise Habits: Home exercise routine, Type of exercise: stretching;strength training/weights, Intensity: Mild Healthy diet  Good water Goals      Patient Stated   .  Prevent falls (pt-stated)      Depression Screen PHQ 2/9 Scores 07/13/2020 02/10/2020 11/12/2019 07/12/2019 07/11/2019 10/20/2017 10/20/2017  PHQ - 2 Score 2 0 0 0 0 3 0  PHQ- 9 Score - - - - - 14 -    Fall Risk  Fall Risk  07/13/2020 02/10/2020 11/12/2019 07/12/2019 07/11/2019  Falls in the past year? 0 1 1 1 1   Number falls in past yr: 0 1 0 1 1  Comment - - - - -    Injury with Fall? - 0 0 0 0  Comment - - - - Fell down the stair  Risk for fall due to : - - - - History of fall(s)  Risk for fall due to: Comment - - - - -  Follow up Falls evaluation completed Falls evaluation completed Falls evaluation completed Falls evaluation completed -   Any stairs in or around the home? Yes . Awaiting ramp to be placed.  Home free of loose throw rugs in walkways, pet beds, electrical cords, etc? Yes  Adequate lighting in your home to reduce risk of falls? Yes   ASSISTIVE DEVICES UTILIZED TO PREVENT FALLS:  Life alert? Yes  Use of a cane, walker or w/c? Yes  Grab bars in the bathroom? No  Shower chair or bench in shower? Yes  Elevated toilet seat or a handicapped toilet? No   TIMED UP AND GO:  Was the test performed? No . Virtual visit.   Cognitive Function: Patient is alert and oriented x3.  Denies difficulty with focusing, memory loss, making decisions.      6CIT Screen 07/11/2019  What Year? 0 points  What month? 0 points  What time? 0 points  Count back from 20 0 points  Months in reverse 0 points  Repeat phrase 0 points  Total Score 0    Immunizations Immunization History  Administered Date(s) Administered  . Influenza,inj,Quad PF,6+ Mos 06/17/2017, 06/21/2018, 06/09/2019  . Influenza-Unspecified 08/24/2016, 06/21/2018, 07/12/2018  . Pneumococcal Polysaccharide-23 12/08/2016  . Tdap 12/08/2016, 06/09/2019    TDAP status: Due, Education has been provided regarding the importance of this vaccine. Advised may receive this vaccine at local pharmacy or Health Dept. Aware to provide a copy of the vaccination record if obtained from local pharmacy or Health Dept. Verbalized acceptance and understanding. Deferred.  Health Maintenance Health Maintenance  Topic Date Due  . COVID-19 Vaccine (1) Never done  . HEMOGLOBIN A1C  08/02/2019  . FOOT EXAM  03/28/2020  . INFLUENZA VACCINE  05/24/2020  . OPHTHALMOLOGY EXAM  05/30/2020  . COLONOSCOPY   03/31/2021  . TETANUS/TDAP  06/08/2029  . PNEUMOCOCCAL POLYSACCHARIDE VACCINE AGE 86-64 HIGH RISK  Completed  . Hepatitis C Screening  Completed  . HIV Screening  Completed   Dental Screening: Recommended annual dental exams for proper oral hygiene.  Covid vaccine- completed.   Community Resource Referral / Chronic Care Management: CRR required this visit?  No   CCM required this visit?  No      Plan:   Keep all routine maintenance appointments.   07/29/20 @ 11:30 discuss need for home PT for walking exercises. Request increase in Lexapro 20mg  daily. Currently taking 40mg . Encouraged to take as prescribed until changed by pcp.   I have personally reviewed and noted the following in the patient's chart:   . Medical and social history . Use of alcohol, tobacco or illicit drugs  . Current medications and supplements . Functional ability and status . Nutritional status . Physical activity . Advanced directives . List of other physicians . Hospitalizations, surgeries, and ER visits in previous 12 months . Vitals . Screenings to include cognitive, depression, and falls . Referrals and appointments  In addition, I have reviewed and discussed with patient certain preventive protocols, quality  metrics, and best practice recommendations. A written personalized care plan for preventive services as well as general preventive health recommendations were provided to patient via mychart.     Varney Biles, LPN   [id number]/61/[id number]

## 2020-07-13 NOTE — Patient Instructions (Addendum)
Michael Montgomery , Thank you for taking time to come for your Medicare Wellness Visit. I appreciate your ongoing commitment to your health goals. Please review the following plan we discussed and let me know if I can assist you in the future.   These are the goals we discussed: Goals      Patient Stated   .  Prevent falls (pt-stated)       This is a list of the screening recommended for you and due dates:  Health Maintenance  Topic Date Due  . COVID-19 Vaccine (1) Never done  . Hemoglobin A1C  08/02/2019  . Complete foot exam   03/28/2020  . Flu Shot  05/24/2020  . Eye exam for diabetics  05/30/2020  . Colon Cancer Screening  03/31/2021  . Tetanus Vaccine  06/08/2029  . Pneumococcal vaccine  Completed  .  Hepatitis C: One time screening is recommended by Center for Disease Control  (CDC) for  adults born from [birth year] through 1963.   Completed  . HIV Screening  Completed    Immunizations Immunization History  Administered Date(s) Administered  . Influenza,inj,Quad PF,6+ Mos 06/17/2017, 06/21/2018, 06/09/2019  . Influenza-Unspecified 08/24/2016, 06/21/2018, 07/12/2018  . Pneumococcal Polysaccharide-23 12/08/2016  . Tdap 12/08/2016, 06/09/2019   Keep all routine maintenance appointments.   07/29/20 @ 11:30 discuss need for home PT for walking exercises. Request increase in Lexapro 20mg  daily. Currently taking Lexapro 40mg .  Encouraged to take as prescribed until changed by pcp.   Advanced directives: mail per patient  Conditions/risks identified: none new.  Follow up in one year for your annual wellness visit.  Preventive Care 40-64 Years, Male Preventive care refers to lifestyle choices and visits with your health care provider that can promote health and wellness. What does preventive care include?  A yearly physical exam. This is also called an annual well check.  Dental exams once or twice a year.  Routine eye exams. Ask your health care provider how often you should  have your eyes checked.  Personal lifestyle choices, including:  Daily care of your teeth and gums.  Regular physical activity.  Eating a healthy diet.  Avoiding tobacco and drug use.  Limiting alcohol use.  Practicing safe sex.  Taking low-dose aspirin every day starting at age 59. What happens during an annual well check? The services and screenings done by your health care provider during your annual well check will depend on your age, overall health, lifestyle risk factors, and family history of disease. Counseling  Your health care provider may ask you questions about your:  Alcohol use.  Tobacco use.  Drug use.  Emotional well-being.  Home and relationship well-being.  Sexual activity.  Eating habits.  Work and work Statistician. Screening  You may have the following tests or measurements:  Height, weight, and BMI.  Blood pressure.  Lipid and cholesterol levels. These may be checked every 5 years, or more frequently if you are over 39 years old.  Skin check.  Lung cancer screening. You may have this screening every year starting at age 58 if you have a 30-pack-year history of smoking and currently smoke or have quit within the past 15 years.  Fecal occult blood test (FOBT) of the stool. You may have this test every year starting at age 53.  Flexible sigmoidoscopy or colonoscopy. You may have a sigmoidoscopy every 5 years or a colonoscopy every 10 years starting at age 59.  Prostate cancer screening. Recommendations will vary depending on your family  history and other risks.  Hepatitis C blood test.  Hepatitis B blood test.  Sexually transmitted disease (STD) testing.  Diabetes screening. This is done by checking your blood sugar (glucose) after you have not eaten for a while (fasting). You may have this done every 1-3 years. Discuss your test results, treatment options, and if necessary, the need for more tests with your health care  provider. Vaccines  Your health care provider may recommend certain vaccines, such as:  Influenza vaccine. This is recommended every year.  Tetanus, diphtheria, and acellular pertussis (Tdap, Td) vaccine. You may need a Td booster every 10 years.  Zoster vaccine. You may need this after age 59.  Pneumococcal 13-valent conjugate (PCV13) vaccine. You may need this if you have certain conditions and have not been vaccinated.  Pneumococcal polysaccharide (PPSV23) vaccine. You may need one or two doses if you smoke cigarettes or if you have certain conditions. Talk to your health care provider about which screenings and vaccines you need and how often you need them. This information is not intended to replace advice given to you by your health care provider. Make sure you discuss any questions you have with your health care provider. Document Released: 11/06/2015 Document Revised: 06/29/2016 Document Reviewed: 08/11/2015 Elsevier Interactive Patient Education  2017 [place] Prevention in the Home Falls can cause injuries. They can happen to people of all ages. There are many things you can do to make your home safe and to help prevent falls. What can I do on the outside of my home?  Regularly fix the edges of walkways and driveways and fix any cracks.  Remove anything that might make you trip as you walk through a door, such as a raised step or threshold.  Trim any bushes or trees on the path to your home.  Use bright outdoor lighting.  Clear any walking paths of anything that might make someone trip, such as rocks or tools.  Regularly check to see if handrails are loose or broken. Make sure that both sides of any steps have handrails.  Any raised decks and porches should have guardrails on the edges.  Have any leaves, snow, or ice cleared regularly.  Use sand or salt on walking paths during winter.  Clean up any spills in your garage right away. This includes oil or  grease spills. What can I do in the bathroom?  Use night lights.  Install grab bars by the toilet and in the tub and shower. Do not use towel bars as grab bars.  Use non-skid mats or decals in the tub or shower.  If you need to sit down in the shower, use a plastic, non-slip stool.  Keep the floor dry. Clean up any water that spills on the floor as soon as it happens.  Remove soap buildup in the tub or shower regularly.  Attach bath mats securely with double-sided non-slip rug tape.  Do not have throw rugs and other things on the floor that can make you trip. What can I do in the bedroom?  Use night lights.  Make sure that you have a light by your bed that is easy to reach.  Do not use any sheets or blankets that are too big for your bed. They should not hang down onto the floor.  Have a firm chair that has side arms. You can use this for support while you get dressed.  Do not have throw rugs and other things on the  floor that can make you trip. What can I do in the kitchen?  Clean up any spills right away.  Avoid walking on wet floors.  Keep items that you use a lot in easy-to-reach places.  If you need to reach something above you, use a strong step stool that has a grab bar.  Keep electrical cords out of the way.  Do not use floor polish or wax that makes floors slippery. If you must use wax, use non-skid floor wax.  Do not have throw rugs and other things on the floor that can make you trip. What can I do with my stairs?  Do not leave any items on the stairs.  Make sure that there are handrails on both sides of the stairs and use them. Fix handrails that are broken or loose. Make sure that handrails are as long as the stairways.  Check any carpeting to make sure that it is firmly attached to the stairs. Fix any carpet that is loose or worn.  Avoid having throw rugs at the top or bottom of the stairs. If you do have throw rugs, attach them to the floor with  carpet tape.  Make sure that you have a light switch at the top of the stairs and the bottom of the stairs. If you do not have them, ask someone to add them for you. What else can I do to help prevent falls?  Wear shoes that:  Do not have high heels.  Have rubber bottoms.  Are comfortable and fit you well.  Are closed at the toe. Do not wear sandals.  If you use a stepladder:  Make sure that it is fully opened. Do not climb a closed stepladder.  Make sure that both sides of the stepladder are locked into place.  Ask someone to hold it for you, if possible.  Clearly mark and make sure that you can see:  Any grab bars or handrails.  First and last steps.  Where the edge of each step is.  Use tools that help you move around (mobility aids) if they are needed. These include:  Canes.  Walkers.  Scooters.  Crutches.  Turn on the lights when you go into a dark area. Replace any light bulbs as soon as they burn out.  Set up your furniture so you have a clear path. Avoid moving your furniture around.  If any of your floors are uneven, fix them.  If there are any pets around you, be aware of where they are.  Review your medicines with your doctor. Some medicines can make you feel dizzy. This can increase your chance of falling. Ask your doctor what other things that you can do to help prevent falls. This information is not intended to replace advice given to you by your health care provider. Make sure you discuss any questions you have with your health care provider. Document Released: 08/06/2009 Document Revised: 03/17/2016 Document Reviewed: 11/14/2014 Elsevier Interactive Patient Education  2017 [employer].

## 2020-07-19 ENCOUNTER — Other Ambulatory Visit: Payer: Self-pay | Admitting: Family Medicine

## 2020-07-29 ENCOUNTER — Telehealth (INDEPENDENT_AMBULATORY_CARE_PROVIDER_SITE_OTHER): Payer: Medicare HMO | Admitting: Family Medicine

## 2020-07-29 ENCOUNTER — Telehealth: Payer: Self-pay | Admitting: Family Medicine

## 2020-07-29 ENCOUNTER — Other Ambulatory Visit: Payer: Self-pay

## 2020-07-29 ENCOUNTER — Encounter: Payer: Self-pay | Admitting: Family Medicine

## 2020-07-29 DIAGNOSIS — I10 Essential (primary) hypertension: Secondary | ICD-10-CM

## 2020-07-29 DIAGNOSIS — F330 Major depressive disorder, recurrent, mild: Secondary | ICD-10-CM | POA: Diagnosis not present

## 2020-07-29 DIAGNOSIS — E119 Type 2 diabetes mellitus without complications: Secondary | ICD-10-CM

## 2020-07-29 DIAGNOSIS — G6181 Chronic inflammatory demyelinating polyneuritis: Secondary | ICD-10-CM

## 2020-07-29 MED ORDER — ONDANSETRON 4 MG PO TBDP: ORAL_TABLET | ORAL | 0 refills | Status: DC

## 2020-07-29 MED ORDER — SERTRALINE HCL 50 MG PO TABS: ORAL_TABLET | ORAL | 1 refills | Status: DC

## 2020-07-29 NOTE — Addendum Note (Signed)
Addended by: De Hollingshead on: 07/29/2020 02:33 PM   Modules accepted: Orders

## 2020-07-29 NOTE — Progress Notes (Signed)
Virtual Visit via video Note  This visit type was conducted due to national recommendations for restrictions regarding the COVID-19 pandemic (e.g. social distancing).  This format is felt to be most appropriate for this patient at this time.  All issues noted in this document were discussed and addressed.  No physical exam was performed (except for noted visual exam findings with Video Visits).   I connected with Michael Montgomery today at 11:30 AM EDT by a video enabled telemedicine application and verified that I am speaking with the correct person using two identifiers. Location patient: home Location provider: work Persons participating in the virtual visit: patient, provider, Jonty Morrical (wife)  I discussed the limitations, risks, security and privacy concerns of performing an evaluation and management service by telephone and the availability of in person appointments. I also discussed with the patient that there may be a patient responsible charge related to this service. The patient expressed understanding and agreed to proceed.   Reason for visit: f/u  HPI: CIDP: Patient notes he is very slowly making progress.  They stopped doing physical therapy sometime back and his wife reports that is because he would not participate well with them.  He is using a manual wheelchair and has not gotten fit for a electric wheelchair.  Does note some nausea if he sits up and Zofran helps with that.  He continues on IVIG and notes his legs seem to be improving though he certainly cannot walk and does have difficulty standing on his own.  He does report some diarrhea and headache after the IVIG.  Patient requests home health physical therapy.  They have also requested inpatient rehab.  Depression: He just does not have a desire to get up and do anything because he cannot walk.  He wants to just lay there.  No SI.  He is currently on Lexapro.  Diabetes: Ranging between 112-120.  Taking Jardiance.  No  polyuria or polydipsia.  No hypoglycemia.  Hypertension: Typically 130/80 or less.  Taking amlodipine and losartan.  No chest pain, shortness of breath, or edema.   ROS: See pertinent positives and negatives per HPI.  Past Medical History:  Diagnosis Date  . Cataract march, april   bilateral removed   . Chickenpox   . Diabetes mellitus without complication (HCC)    metformin   . Diabetic amyotrophy [facility])    [facility]es dr at [facility]  . Gastric ulcer   . Heart murmur    When he was younger, no recent issues  . High blood pressure    controlled with meds  . Hypercholesteremia    controlled with medication  . Knee injury    left  . Motion sickness    boats  . Neuropathy     Past Surgical History:  Procedure Laterality Date  . CATARACT EXTRACTION W/PHACO Right 02/01/2016   Procedure: CATARACT EXTRACTION PHACO AND INTRAOCULAR LENS PLACEMENT ([place]) right;  Surgeon: Ronnell Freshwater, MD;  Location: [place];  Service: Ophthalmology;  Laterality: Right;  DIABETIC - oral meds  . CATARACT EXTRACTION W/PHACO Left 03/07/2016   Procedure: CATARACT EXTRACTION PHACO AND INTRAOCULAR LENS PLACEMENT ([facility]);  Surgeon: Ronnell Freshwater, MD;  Location: [place];  Service: Ophthalmology;  Laterality: Left;  DIABETIC - oral meds   . circumscision      Family History  Problem Relation Age of Onset  . Alcoholism Father   . Hyperlipidemia Father   . Hypertension Father   . Throat cancer Father  smoked but had stopped 20 yeras before dx  . Diabetes Mother   . Emphysema Mother   . Kidney disease Neg Hx   . Prostate cancer Neg Hx   . Colon cancer Neg Hx   . Colon polyps Neg Hx   . Esophageal cancer Neg Hx   . Rectal cancer Neg Hx   . Stomach cancer Neg Hx   . Kidney cancer Neg Hx   . Bladder Cancer Neg Hx     SOCIAL HX: Former smoker   Current Outpatient Medications:  .  albuterol (VENTOLIN HFA) 108 (90 Base) MCG/ACT inhaler, Inhale 2-4 puffs by mouth  every 4 hours as needed for wheezing, cough, and/or shortness of breath, Disp: 8 g, Rfl: 1 .  amLODipine (NORVASC) 10 MG tablet, TAKE 1 TABLET BY MOUTH EVERY DAY, Disp: 30 tablet, Rfl: 0 .  atorvastatin (LIPITOR) 40 MG tablet, TAKE 1 TABLET BY MOUTH DAILY, Disp: 30 tablet, Rfl: 1 .  clomiPHENE (CLOMID) 50 MG tablet, Take 1/2 tablet daily, Disp: 30 tablet, Rfl: 3 .  fluticasone (FLONASE) 50 MCG/ACT nasal spray, Place 2 sprays into both nostrils daily., Disp: 16 g, Rfl: 6 .  gabapentin (NEURONTIN) 300 MG capsule, Take 1 capsule by mouth 2 (two) times daily. Take 1 cap in AM and 2 caps at bedtime, Disp: , Rfl:  .  GAMUNEX-C 20 GM/200ML SOLN, , Disp: , Rfl:  .  JARDIANCE 10 MG TABS tablet, TAKE 1 TABLET BY MOUTH DAILY BEFORE BREAKFAST, Disp: 30 tablet, Rfl: 2 .  LINZESS 72 MCG capsule, TAKE 1 CAPSULE (72 MCG TOTAL) BY MOUTH DAILY BEFORE BREAKFAST., Disp: 30 capsule, Rfl: 1 .  loratadine (CLARITIN) 10 MG tablet, TAKE 1 TABLET EVERY DAY, Disp: 30 tablet, Rfl: 1 .  losartan (COZAAR) 100 MG tablet, TAKE 1 TABLET BY MOUTH EVERY DAY (Patient taking differently: Take 100 mg by mouth daily. ), Disp: 90 tablet, Rfl: 3 .  metFORMIN (GLUCOPHAGE-XR) 500 MG 24 hr tablet, TAKE 3 TABLETS (1,500 MG TOTAL) BY MOUTH DAILY WITH BREAKFAST., Disp: 270 tablet, Rfl: 1 .  Multiple Vitamin (MULTIVITAMIN) capsule, Take 1 capsule by mouth daily., Disp: , Rfl:  .  Nutritional Supplements (ENSURE ACTIVE HIGH PROTEIN) LIQD, Take 1 Can by mouth 3 (three) times daily as needed., Disp: 21330 mL, Rfl: 11 .  Omega-3 Fatty Acids (FISH OIL PO), Take 1 capsule by mouth daily. , Disp: , Rfl:  .  omeprazole (PRILOSEC) 20 MG capsule, TAKE 1 CAPSULE BY MOUTH EVERY DAY, Disp: 90 capsule, Rfl: 1 .  ondansetron (ZOFRAN ODT) 4 MG disintegrating tablet, Allow 1-2 tablets to dissolve in your mouth every 8 hours as needed for nausea/vomiting, Disp: 30 tablet, Rfl: 0 .  sildenafil (VIAGRA) 50 MG tablet, Take 50 mg by mouth as needed for erectile  dysfunction. , Disp: , Rfl:  .  sertraline (ZOLOFT) 50 MG tablet, Take 1 tablet (50 mg total) by mouth daily for 14 days, THEN 2 tablets (100 mg total) daily., Disp: 180 tablet, Rfl: 1  EXAM:  VITALS per patient if applicable:  GENERAL: alert, oriented, appears well and in no acute distress  HEENT: atraumatic, conjunttiva clear, no obvious abnormalities on inspection of external nose and ears  NECK: normal movements of the head and neck  LUNGS: on inspection no signs of respiratory distress, breathing rate appears normal, no obvious gross SOB, gasping or wheezing  CV: no obvious cyanosis  MS: moves all visible extremities without noticeable abnormality  PSYCH/NEURO: pleasant and cooperative, no obvious  depression or anxiety, speech and thought processing grossly intact  ASSESSMENT AND PLAN:  Discussed the following assessment and plan:  Problem List Items Addressed This Visit    CIDP (chronic inflammatory demyelinating polyneuropathy) (HCC)    Continues to have issues with weakness in his legs.  I do believe he would benefit from an electric wheelchair and I suspect he has to have a physical therapy evaluation to complete that process.  I will have my CMA reach out to [employer] to see what needs to be completed to get him an electric wheelchair.  We are also going to refer her to chronic care management to see if there are social workers can provide any resources to the patient particularly to help with him getting a wheelchair ramp for the house.  I will also refer him back to neurology as he likely needs to follow-up with them regarding his IVIG.      Relevant Medications   sertraline (ZOLOFT) 50 MG tablet   Other Relevant Orders   Ambulatory referral to Chronic Care Management Services   Ambulatory referral to Neurology   Ambulatory referral to [place]   Depression, major, recurrent, mild ([place])    Continues to have issues with this.  We will switch him from Lexapro to Zoloft.   He will make a direct switch to Zoloft 50 mg once daily.  He will taper up to 100 mg after 2 weeks.      Relevant Medications   sertraline (ZOLOFT) 50 MG tablet   Diabetes ([place])    Seems to be well controlled.  We will see if home health can obtain an A1c.  He will continue Jardiance 10 mg daily.      Essential hypertension    Well-controlled.  Patient is overdue for labs.  We will see if home health can draw a CMP, lipid panel, and A1c.  He will continue amlodipine 10 mg daily and losartan 100 mg daily.          I discussed the assessment and treatment plan with the patient. The patient was provided an opportunity to ask questions and all were answered. The patient agreed with the plan and demonstrated an understanding of the instructions.   The patient was advised to call back or seek an in-person evaluation if the symptoms worsen or if the condition fails to improve as anticipated.   [name], MD

## 2020-07-29 NOTE — Assessment & Plan Note (Signed)
Continues to have issues with weakness in his legs.  I do believe he would benefit from an electric wheelchair and I suspect he has to have a physical therapy evaluation to complete that process.  I will have my CMA reach out to [employer] to see what needs to be completed to get him an electric wheelchair.  We are also going to refer her to chronic care management to see if there are social workers can provide any resources to the patient particularly to help with him getting a wheelchair ramp for the house.  I will also refer him back to neurology as he likely needs to follow-up with them regarding his IVIG.

## 2020-07-29 NOTE — Assessment & Plan Note (Signed)
Seems to be well controlled.  We will see if home health can obtain an A1c.  He will continue Jardiance 10 mg daily.

## 2020-07-29 NOTE — Assessment & Plan Note (Signed)
Well-controlled.  Patient is overdue for labs.  We will see if home health can draw a CMP, lipid panel, and A1c.  He will continue amlodipine 10 mg daily and losartan 100 mg daily.

## 2020-07-29 NOTE — Telephone Encounter (Signed)
I called and spoke with the patient and he wants [place] to mail a DPR to his home for him to fill out, this was mailed today.  Fread Kottke,cma

## 2020-07-29 NOTE — Telephone Encounter (Signed)
Please let the patient know that he would have to give permission each time for [place] to speak with his wife or he have to fill out a new or DPR.  If they want to fill the DPR out we could mail a copy to them and they can mail it back or drop it off.

## 2020-07-29 NOTE — Telephone Encounter (Signed)
-----   Message from Larey Days sent at 07/29/2020  2:14 PM EDT ----- He will have to give permission each time. He will need to fill out the DPR. ----- Message ----- From: Leone Haven, MD Sent: 07/29/2020   1:46 PM EDT To: Lorin Picket, this patient gave verbal permission for [place] to speak with his wife regarding his medical issues.  It looks like she is not on his DPR.  Can we add her with his verbal permission or is he going to have to sign another form?  Thanks.

## 2020-07-29 NOTE — Patient Outreach (Signed)
[facility]) Care Management  07/29/2020  Michael Montgomery 07/26/1961 [id number]   Referral Date: 07/29/20 Referral Source: MDreferral Referral Reason: Needs help with wheelchair ramp. Patient trying to get electric wheelchair. Patient also thinking of SNF placement for rehab.    Outreach Attempt: Spoke with patient.  Discussed reason for referral. Patient reports having some difficulty with getting a ramp built as he does not qualify for several programs due to his age. He states he needs the ramp to get out of the home.  He is working on getting an Clinical research associate but needs a ramp.  Patient reports he is just lacking measurement for the chair. Patient also referred to home health for PT at this time.     Social: Patient lives in the home with spouse.  Patient reports needing assistance with all aspects of care.  Patient has an aide 2 hrs a day to help with ADL's.   Conditions: Patient with Chronic inflammatory demyelinating polyneuropathy causing him to not be able to be active thus needing assistance with all aspects of care.  Patient also has HTN and diabetes, which are well controlled with medications. Patient is an active D-SNP patient and receives outreach regularly from [place] for disease management support.     Medications: Patient takes medications as prescribed and denies any problems with medications.      Appointments: Patient saw PCP today via virtual visit.    Advanced Directives: Patient does not have an advanced directive at this time.     Consent: Patient agreeable to social work to help with ramp placement and possible help with SNF placement for rehab if he decides.     Plan: RN CM will refer to social work.  RN CM will sign off as patient is in active disease management with [facility] D-SNP program.     Jone Baseman, RN, MSN [place] [facility] Care Management Coordinator Direct Line [phone] Toll Free: [phone]  Fax:  [phone]

## 2020-07-29 NOTE — Assessment & Plan Note (Signed)
Continues to have issues with this.  We will switch him from Lexapro to Zoloft.  He will make a direct switch to Zoloft 50 mg once daily.  He will taper up to 100 mg after 2 weeks.

## 2020-07-29 NOTE — Patient Instructions (Signed)
Nice to see you. We are going to discontinue your Lexapro and transitioning on to Zoloft.  The day after you stop the Lexapro you can start on the Zoloft 50 mg once daily.  He will remain on this dose for 14 days and then he will increase the dose to 100 mg daily.  If you notice any adverse effects or side effects with making this change please let us know.

## 2020-07-31 DIAGNOSIS — Z8616 Personal history of COVID-19: Secondary | ICD-10-CM | POA: Diagnosis not present

## 2020-07-31 DIAGNOSIS — E7800 Pure hypercholesterolemia, unspecified: Secondary | ICD-10-CM | POA: Diagnosis not present

## 2020-07-31 DIAGNOSIS — M48061 Spinal stenosis, lumbar region without neurogenic claudication: Secondary | ICD-10-CM | POA: Diagnosis not present

## 2020-07-31 DIAGNOSIS — E1144 Type 2 diabetes mellitus with diabetic amyotrophy: Secondary | ICD-10-CM | POA: Diagnosis not present

## 2020-07-31 DIAGNOSIS — N400 Enlarged prostate without lower urinary tract symptoms: Secondary | ICD-10-CM | POA: Diagnosis not present

## 2020-07-31 DIAGNOSIS — F329 Major depressive disorder, single episode, unspecified: Secondary | ICD-10-CM | POA: Diagnosis not present

## 2020-07-31 DIAGNOSIS — Z7984 Long term (current) use of oral hypoglycemic drugs: Secondary | ICD-10-CM | POA: Diagnosis not present

## 2020-07-31 DIAGNOSIS — G6181 Chronic inflammatory demyelinating polyneuritis: Secondary | ICD-10-CM | POA: Diagnosis not present

## 2020-07-31 DIAGNOSIS — I10 Essential (primary) hypertension: Secondary | ICD-10-CM | POA: Diagnosis not present

## 2020-08-03 ENCOUNTER — Telehealth: Payer: Self-pay

## 2020-08-03 NOTE — Telephone Encounter (Signed)
    MA10/08/2020 1st Attempt  Name: Michael Montgomery   MRN: [id number]   DOB: September 04, 1961   AGE: 59 y.o.   GENDER: male   PCP Leone Haven, MD.   08/03/20 Emailed referral to [name] at [place] also left message on voicemail to make sure the referral has been received.   Spoke with patient to let him know a referral has been sent to [facility].  Will follow-up with patient in the next 7 days.    Xitlally Mooneyham, AAS Paralegal, [place] . Embedded Care Coordination [facility]  Care Management  [address], [place] [address] [email or web address]  [phone]   [email or web address]

## 2020-08-04 ENCOUNTER — Telehealth: Payer: Self-pay

## 2020-08-04 NOTE — Telephone Encounter (Signed)
    MA10/09/2020 1st Attempt  Name: Michael Montgomery   MRN: [id number]   DOB: May 04, 1961   AGE: 59 y.o.   GENDER: male   PCP Leone Haven, MD.   08/04/20 Spoke with patient about referral placed with [facility] he is aware that Sharyn Lull is waiting on his bank statements.  He is in the process of getting his bank statements to [place]. 08/03/20 Received email confirmation from [name] at [place] "[facility], thank you for the referral. I can confirm that this gentleman made a referral himself and we already have a case file for him for a ramp and bathroom grab bars. He has been found medically eligible and we are currently waiting on his bank statement and deductions to determine financial eligibility".  Patient stated that he did not need any other resources at this time. Closing referral.    Mahiya Kercheval, AAS Paralegal, [place] . Embedded Care Coordination [facility]  Care Management  [address], [place] [address] [email or web address]  [phone]   [email or web address]

## 2020-08-05 ENCOUNTER — Encounter: Payer: Self-pay | Admitting: *Deleted

## 2020-08-05 ENCOUNTER — Other Ambulatory Visit: Payer: Self-pay | Admitting: *Deleted

## 2020-08-05 NOTE — Patient Outreach (Addendum)
[facility]) Care Management  08/05/2020  Michael Montgomery [birth year] [id number]  CSW was able to make initial contact with patient today to perform phone assessment, as well as assess and assist with social work needs and services.  CSW introduced self, explained role and types of services provided through [place] [facility] ([place] [facility]).  CSW further explained to patient that CSW works with patient's Telephonic RNCM, also with [place] [facility], Dionne Leath.  CSW then explained the reason for the call, indicating that Michael Montgomery thought that patient would benefit from social work services and resources to assist with obtaining financial assistance to have a wheelchair ramp installed at his home.  CSW obtained two HIPAA compliant identifiers from patient, which included patient's name and date of birth.  CSW tried to ask patient a series of questions to gain more insight into his specific home care needs; however, patient was somewhat vague in his responses.  Patient admitted that he needed financial assistance to have a wheelchair ramp installed at his home, but could not remember the amount of stairs required to climb to enter his home from his front door or his back door.  Patient indicated that he and his wife, Michael Montgomery own their own home, so no permission needed from a landlord or Secondary school teacher.  CSW obtained verbal consent from patient to place referrals to various community agencies and resources in search of financial assistance for wheelchair ramp installation, through the [employer].  CSW also agreed to e-mail ([email or web address]) patient a list of community agencies and resources, that may not appear on the Platform.  Patient is aware that he will be receiving direct calls from the various agencies referred too, as each agency will want to perform their own assessment.  Patient voiced understanding and was agreeable  to this plan.  Patient reported that he is currently wheelchair bound, unable to ambulate without maximum assistance and the aide of durable medical equipment.  Patient prefers not to ambulate though, due to the excruciating pain he experiences in both his knees.  Patient went on to explain that he always has someone with him to provide 24 hour care and supervision, as he is unable to perform activities of daily living independently.    CSW was able to refer patient to all of the following community agencies and resources:  [employer]; Soil scientist; [employer]; [employer] the [employer]; Proofreader; [place]; Oncologist; Technical brewer in International aid/development worker; Psychologist, counselling; Proofreader; [employer] [facility] [employer]; [place]; [place]; [place] inquired as to whether or not patient is utilizing his Adult Medicaid benefit to receive transportation assistance, for which patient denied.  Patient admitted that he was not aware that he was eligible for transportation assistance through Adult Medicaid.  CSW encouraged patient to contact his Medicaid case worker, at the [place], to have them enlist him in [employer], as patient is currently an active Medicaid recipient.  CSW explained to patient that he can also request that his Medicaid case worker assist him with applying for Food Stamps.  CSW agreed to follow-up with patient again next week, on Thursday, August 13, 2020, around 10:00am, to ensure that he received the resource information e-mailed to him by CSW.  Nat Christen, BSW, MSW, LCSW  Licensed Regulatory affairs officer Health System  Mailing [place] [address], [place], [place] [address] Physical Address-[address] [place], [place] [address] Toll Free Main # [phone] Fax # [phone] Cell # [phone]  [email or web address]

## 2020-08-06 ENCOUNTER — Ambulatory Visit: Payer: Self-pay | Admitting: *Deleted

## 2020-08-06 DIAGNOSIS — G6181 Chronic inflammatory demyelinating polyneuritis: Secondary | ICD-10-CM | POA: Diagnosis not present

## 2020-08-07 DIAGNOSIS — G6181 Chronic inflammatory demyelinating polyneuritis: Secondary | ICD-10-CM | POA: Diagnosis not present

## 2020-08-07 DIAGNOSIS — Z8616 Personal history of COVID-19: Secondary | ICD-10-CM | POA: Diagnosis not present

## 2020-08-07 DIAGNOSIS — F329 Major depressive disorder, single episode, unspecified: Secondary | ICD-10-CM | POA: Diagnosis not present

## 2020-08-07 DIAGNOSIS — I10 Essential (primary) hypertension: Secondary | ICD-10-CM | POA: Diagnosis not present

## 2020-08-07 DIAGNOSIS — E7800 Pure hypercholesterolemia, unspecified: Secondary | ICD-10-CM | POA: Diagnosis not present

## 2020-08-07 DIAGNOSIS — N400 Enlarged prostate without lower urinary tract symptoms: Secondary | ICD-10-CM | POA: Diagnosis not present

## 2020-08-07 DIAGNOSIS — E1144 Type 2 diabetes mellitus with diabetic amyotrophy: Secondary | ICD-10-CM | POA: Diagnosis not present

## 2020-08-07 DIAGNOSIS — Z7984 Long term (current) use of oral hypoglycemic drugs: Secondary | ICD-10-CM | POA: Diagnosis not present

## 2020-08-07 DIAGNOSIS — M48061 Spinal stenosis, lumbar region without neurogenic claudication: Secondary | ICD-10-CM | POA: Diagnosis not present

## 2020-08-11 DIAGNOSIS — N400 Enlarged prostate without lower urinary tract symptoms: Secondary | ICD-10-CM | POA: Diagnosis not present

## 2020-08-11 DIAGNOSIS — F329 Major depressive disorder, single episode, unspecified: Secondary | ICD-10-CM | POA: Diagnosis not present

## 2020-08-11 DIAGNOSIS — M48061 Spinal stenosis, lumbar region without neurogenic claudication: Secondary | ICD-10-CM | POA: Diagnosis not present

## 2020-08-11 DIAGNOSIS — M6281 Muscle weakness (generalized): Secondary | ICD-10-CM | POA: Diagnosis not present

## 2020-08-11 DIAGNOSIS — M7542 Impingement syndrome of left shoulder: Secondary | ICD-10-CM | POA: Diagnosis not present

## 2020-08-11 DIAGNOSIS — Z7984 Long term (current) use of oral hypoglycemic drugs: Secondary | ICD-10-CM | POA: Diagnosis not present

## 2020-08-11 DIAGNOSIS — S39012D Strain of muscle, fascia and tendon of lower back, subsequent encounter: Secondary | ICD-10-CM | POA: Diagnosis not present

## 2020-08-11 DIAGNOSIS — Z8616 Personal history of COVID-19: Secondary | ICD-10-CM | POA: Diagnosis not present

## 2020-08-11 DIAGNOSIS — E7800 Pure hypercholesterolemia, unspecified: Secondary | ICD-10-CM | POA: Diagnosis not present

## 2020-08-11 DIAGNOSIS — R531 Weakness: Secondary | ICD-10-CM | POA: Diagnosis not present

## 2020-08-11 DIAGNOSIS — I10 Essential (primary) hypertension: Secondary | ICD-10-CM | POA: Diagnosis not present

## 2020-08-11 DIAGNOSIS — G6181 Chronic inflammatory demyelinating polyneuritis: Secondary | ICD-10-CM | POA: Diagnosis not present

## 2020-08-11 DIAGNOSIS — E1144 Type 2 diabetes mellitus with diabetic amyotrophy: Secondary | ICD-10-CM | POA: Diagnosis not present

## 2020-08-12 ENCOUNTER — Other Ambulatory Visit: Payer: Self-pay | Admitting: Family Medicine

## 2020-08-12 DIAGNOSIS — E7800 Pure hypercholesterolemia, unspecified: Secondary | ICD-10-CM | POA: Diagnosis not present

## 2020-08-12 DIAGNOSIS — L97519 Non-pressure chronic ulcer of other part of right foot with unspecified severity: Secondary | ICD-10-CM | POA: Diagnosis not present

## 2020-08-12 DIAGNOSIS — S39012D Strain of muscle, fascia and tendon of lower back, subsequent encounter: Secondary | ICD-10-CM | POA: Diagnosis not present

## 2020-08-12 DIAGNOSIS — M7542 Impingement syndrome of left shoulder: Secondary | ICD-10-CM | POA: Diagnosis not present

## 2020-08-12 DIAGNOSIS — G6181 Chronic inflammatory demyelinating polyneuritis: Secondary | ICD-10-CM | POA: Diagnosis not present

## 2020-08-12 DIAGNOSIS — N400 Enlarged prostate without lower urinary tract symptoms: Secondary | ICD-10-CM | POA: Diagnosis not present

## 2020-08-12 DIAGNOSIS — E1144 Type 2 diabetes mellitus with diabetic amyotrophy: Secondary | ICD-10-CM | POA: Diagnosis not present

## 2020-08-12 DIAGNOSIS — M48061 Spinal stenosis, lumbar region without neurogenic claudication: Secondary | ICD-10-CM | POA: Diagnosis not present

## 2020-08-12 DIAGNOSIS — E785 Hyperlipidemia, unspecified: Secondary | ICD-10-CM | POA: Diagnosis not present

## 2020-08-12 DIAGNOSIS — F329 Major depressive disorder, single episode, unspecified: Secondary | ICD-10-CM | POA: Diagnosis not present

## 2020-08-12 DIAGNOSIS — Z7984 Long term (current) use of oral hypoglycemic drugs: Secondary | ICD-10-CM | POA: Diagnosis not present

## 2020-08-12 DIAGNOSIS — R29898 Other symptoms and signs involving the musculoskeletal system: Secondary | ICD-10-CM | POA: Diagnosis not present

## 2020-08-12 DIAGNOSIS — I10 Essential (primary) hypertension: Secondary | ICD-10-CM | POA: Diagnosis not present

## 2020-08-12 DIAGNOSIS — Z8616 Personal history of COVID-19: Secondary | ICD-10-CM | POA: Diagnosis not present

## 2020-08-13 ENCOUNTER — Encounter: Payer: Self-pay | Admitting: *Deleted

## 2020-08-13 ENCOUNTER — Other Ambulatory Visit: Payer: Self-pay | Admitting: *Deleted

## 2020-08-13 NOTE — Patient Outreach (Signed)
[facility]) Care Management  08/13/2020  NUR KRASINSKI 1960-11-08 [id number]   CSW was able to make contact with patient today to follow-up regarding social work services and resources, as well as to ensure that he received the list of resources that CSW e-mailed to him, per his request.  Patient confirmed receipt, indicating that he may actually already have some leads, just from the few agencies that he has contacted.  Patient indicated that he plans to follow-up with these agencies today, after terminating the call with CSW.  However, patient denied receiving any calls from any of the agencies that CSW submitted for him via the [place], despite CSW referring him to 14 different agencies.  CSW provided patient with the contact number for 7 of the 14 agencies, agreeing to contact the other 7 on his behalf.  CSW first obtained verbal consent from patient to provide these agencies with his basic demographic information and contact number, as CSW will encourage the agencies to contact patient directly if they are able to offer assistance.  CSW also provided patient with a few additional agencies to contact, such as [employer], Associate Professor (Teacher, music of [employer]) and [employer].  Patient also agreed to independently contact these agencies to request financial assistance for wheelchair ramp installation.  CSW offered to follow-up with patient again next week, to see if he has any additional leads; however, patient agreed to contact CSW if he has questions or needs additional assistance, confirming that he has the correct contact information for CSW.  CSW will perform a case closure on patient, as all goals of treatment have been met from social work standpoint and no additional social work needs have been identified at this time.  CSW will fax an update to patient's Primary Care Physician, Dr. [name] to ensure that he is  aware of CSW's involvement with patient's plan of care, as well as send a Physician Case Closure Letter to Dr. Ellen Henri office.    Nat Christen, BSW, MSW, LCSW  Licensed Education officer, environmental Health System  Mailing [place] [address], [place], [place] [address] Physical Address-[address] [place], [place] [address] Toll Free Main # [phone] Fax # [phone] Cell # [phone]  [email or web address]

## 2020-08-14 DIAGNOSIS — I10 Essential (primary) hypertension: Secondary | ICD-10-CM | POA: Diagnosis not present

## 2020-08-14 DIAGNOSIS — G6181 Chronic inflammatory demyelinating polyneuritis: Secondary | ICD-10-CM | POA: Diagnosis not present

## 2020-08-14 DIAGNOSIS — F329 Major depressive disorder, single episode, unspecified: Secondary | ICD-10-CM | POA: Diagnosis not present

## 2020-08-14 DIAGNOSIS — E1144 Type 2 diabetes mellitus with diabetic amyotrophy: Secondary | ICD-10-CM | POA: Diagnosis not present

## 2020-08-14 DIAGNOSIS — M48061 Spinal stenosis, lumbar region without neurogenic claudication: Secondary | ICD-10-CM | POA: Diagnosis not present

## 2020-08-14 DIAGNOSIS — Z8616 Personal history of COVID-19: Secondary | ICD-10-CM | POA: Diagnosis not present

## 2020-08-14 DIAGNOSIS — N400 Enlarged prostate without lower urinary tract symptoms: Secondary | ICD-10-CM | POA: Diagnosis not present

## 2020-08-14 DIAGNOSIS — Z7984 Long term (current) use of oral hypoglycemic drugs: Secondary | ICD-10-CM | POA: Diagnosis not present

## 2020-08-14 DIAGNOSIS — E7800 Pure hypercholesterolemia, unspecified: Secondary | ICD-10-CM | POA: Diagnosis not present

## 2020-08-17 DIAGNOSIS — I10 Essential (primary) hypertension: Secondary | ICD-10-CM | POA: Diagnosis not present

## 2020-08-17 DIAGNOSIS — F329 Major depressive disorder, single episode, unspecified: Secondary | ICD-10-CM | POA: Diagnosis not present

## 2020-08-17 DIAGNOSIS — E1144 Type 2 diabetes mellitus with diabetic amyotrophy: Secondary | ICD-10-CM | POA: Diagnosis not present

## 2020-08-17 DIAGNOSIS — M48061 Spinal stenosis, lumbar region without neurogenic claudication: Secondary | ICD-10-CM | POA: Diagnosis not present

## 2020-08-17 DIAGNOSIS — E7800 Pure hypercholesterolemia, unspecified: Secondary | ICD-10-CM | POA: Diagnosis not present

## 2020-08-17 DIAGNOSIS — Z7984 Long term (current) use of oral hypoglycemic drugs: Secondary | ICD-10-CM | POA: Diagnosis not present

## 2020-08-17 DIAGNOSIS — Z8616 Personal history of COVID-19: Secondary | ICD-10-CM | POA: Diagnosis not present

## 2020-08-17 DIAGNOSIS — N400 Enlarged prostate without lower urinary tract symptoms: Secondary | ICD-10-CM | POA: Diagnosis not present

## 2020-08-17 DIAGNOSIS — G6181 Chronic inflammatory demyelinating polyneuritis: Secondary | ICD-10-CM | POA: Diagnosis not present

## 2020-08-18 DIAGNOSIS — G6181 Chronic inflammatory demyelinating polyneuritis: Secondary | ICD-10-CM | POA: Diagnosis not present

## 2020-08-19 DIAGNOSIS — F329 Major depressive disorder, single episode, unspecified: Secondary | ICD-10-CM | POA: Diagnosis not present

## 2020-08-19 DIAGNOSIS — Z8616 Personal history of COVID-19: Secondary | ICD-10-CM | POA: Diagnosis not present

## 2020-08-19 DIAGNOSIS — M48061 Spinal stenosis, lumbar region without neurogenic claudication: Secondary | ICD-10-CM | POA: Diagnosis not present

## 2020-08-19 DIAGNOSIS — E1144 Type 2 diabetes mellitus with diabetic amyotrophy: Secondary | ICD-10-CM | POA: Diagnosis not present

## 2020-08-19 DIAGNOSIS — E7800 Pure hypercholesterolemia, unspecified: Secondary | ICD-10-CM | POA: Diagnosis not present

## 2020-08-19 DIAGNOSIS — Z7984 Long term (current) use of oral hypoglycemic drugs: Secondary | ICD-10-CM | POA: Diagnosis not present

## 2020-08-19 DIAGNOSIS — G6181 Chronic inflammatory demyelinating polyneuritis: Secondary | ICD-10-CM | POA: Diagnosis not present

## 2020-08-19 DIAGNOSIS — N400 Enlarged prostate without lower urinary tract symptoms: Secondary | ICD-10-CM | POA: Diagnosis not present

## 2020-08-19 DIAGNOSIS — I10 Essential (primary) hypertension: Secondary | ICD-10-CM | POA: Diagnosis not present

## 2020-08-20 DIAGNOSIS — G6181 Chronic inflammatory demyelinating polyneuritis: Secondary | ICD-10-CM | POA: Diagnosis not present

## 2020-08-21 ENCOUNTER — Telehealth: Payer: Self-pay

## 2020-08-21 DIAGNOSIS — Z8616 Personal history of COVID-19: Secondary | ICD-10-CM | POA: Diagnosis not present

## 2020-08-21 DIAGNOSIS — G6181 Chronic inflammatory demyelinating polyneuritis: Secondary | ICD-10-CM | POA: Diagnosis not present

## 2020-08-21 DIAGNOSIS — M48061 Spinal stenosis, lumbar region without neurogenic claudication: Secondary | ICD-10-CM | POA: Diagnosis not present

## 2020-08-21 DIAGNOSIS — N400 Enlarged prostate without lower urinary tract symptoms: Secondary | ICD-10-CM | POA: Diagnosis not present

## 2020-08-21 DIAGNOSIS — Z7984 Long term (current) use of oral hypoglycemic drugs: Secondary | ICD-10-CM | POA: Diagnosis not present

## 2020-08-21 DIAGNOSIS — I10 Essential (primary) hypertension: Secondary | ICD-10-CM | POA: Diagnosis not present

## 2020-08-21 DIAGNOSIS — F329 Major depressive disorder, single episode, unspecified: Secondary | ICD-10-CM | POA: Diagnosis not present

## 2020-08-21 DIAGNOSIS — E1144 Type 2 diabetes mellitus with diabetic amyotrophy: Secondary | ICD-10-CM | POA: Diagnosis not present

## 2020-08-21 DIAGNOSIS — E7800 Pure hypercholesterolemia, unspecified: Secondary | ICD-10-CM | POA: Diagnosis not present

## 2020-08-21 NOTE — Telephone Encounter (Signed)
Sela Hua with [facility] Physical therapy states that pt sent him a text today and said that he does not feel well and canceled for today. FYI.  Berthe Oley,cma

## 2020-08-21 NOTE — Telephone Encounter (Signed)
Michael Montgomery with [facility] Physical therapy states that pt sent him a text today and said that he does not feel well and canceled for today. FYI

## 2020-08-21 NOTE — Telephone Encounter (Signed)
Noted. Please follow-up with the patient to see how he is feeling. What symptoms is he having? Thanks.

## 2020-08-25 DIAGNOSIS — E1144 Type 2 diabetes mellitus with diabetic amyotrophy: Secondary | ICD-10-CM | POA: Diagnosis not present

## 2020-08-25 DIAGNOSIS — E7800 Pure hypercholesterolemia, unspecified: Secondary | ICD-10-CM | POA: Diagnosis not present

## 2020-08-25 DIAGNOSIS — G6181 Chronic inflammatory demyelinating polyneuritis: Secondary | ICD-10-CM | POA: Diagnosis not present

## 2020-08-25 DIAGNOSIS — Z8616 Personal history of COVID-19: Secondary | ICD-10-CM | POA: Diagnosis not present

## 2020-08-25 DIAGNOSIS — Z7984 Long term (current) use of oral hypoglycemic drugs: Secondary | ICD-10-CM | POA: Diagnosis not present

## 2020-08-25 DIAGNOSIS — I10 Essential (primary) hypertension: Secondary | ICD-10-CM | POA: Diagnosis not present

## 2020-08-25 DIAGNOSIS — N400 Enlarged prostate without lower urinary tract symptoms: Secondary | ICD-10-CM | POA: Diagnosis not present

## 2020-08-25 DIAGNOSIS — F329 Major depressive disorder, single episode, unspecified: Secondary | ICD-10-CM | POA: Diagnosis not present

## 2020-08-25 DIAGNOSIS — M48061 Spinal stenosis, lumbar region without neurogenic claudication: Secondary | ICD-10-CM | POA: Diagnosis not present

## 2020-08-26 DIAGNOSIS — G6181 Chronic inflammatory demyelinating polyneuritis: Secondary | ICD-10-CM | POA: Diagnosis not present

## 2020-08-26 DIAGNOSIS — Z7984 Long term (current) use of oral hypoglycemic drugs: Secondary | ICD-10-CM | POA: Diagnosis not present

## 2020-08-26 DIAGNOSIS — N400 Enlarged prostate without lower urinary tract symptoms: Secondary | ICD-10-CM | POA: Diagnosis not present

## 2020-08-26 DIAGNOSIS — I10 Essential (primary) hypertension: Secondary | ICD-10-CM | POA: Diagnosis not present

## 2020-08-26 DIAGNOSIS — E7800 Pure hypercholesterolemia, unspecified: Secondary | ICD-10-CM | POA: Diagnosis not present

## 2020-08-26 DIAGNOSIS — M48061 Spinal stenosis, lumbar region without neurogenic claudication: Secondary | ICD-10-CM | POA: Diagnosis not present

## 2020-08-26 DIAGNOSIS — E1144 Type 2 diabetes mellitus with diabetic amyotrophy: Secondary | ICD-10-CM | POA: Diagnosis not present

## 2020-08-26 DIAGNOSIS — F329 Major depressive disorder, single episode, unspecified: Secondary | ICD-10-CM | POA: Diagnosis not present

## 2020-08-26 DIAGNOSIS — Z8616 Personal history of COVID-19: Secondary | ICD-10-CM | POA: Diagnosis not present

## 2020-08-30 ENCOUNTER — Other Ambulatory Visit: Payer: Self-pay | Admitting: Family Medicine

## 2020-09-01 DIAGNOSIS — N400 Enlarged prostate without lower urinary tract symptoms: Secondary | ICD-10-CM | POA: Diagnosis not present

## 2020-09-01 DIAGNOSIS — F329 Major depressive disorder, single episode, unspecified: Secondary | ICD-10-CM | POA: Diagnosis not present

## 2020-09-01 DIAGNOSIS — I10 Essential (primary) hypertension: Secondary | ICD-10-CM | POA: Diagnosis not present

## 2020-09-01 DIAGNOSIS — E1144 Type 2 diabetes mellitus with diabetic amyotrophy: Secondary | ICD-10-CM | POA: Diagnosis not present

## 2020-09-01 DIAGNOSIS — Z8616 Personal history of COVID-19: Secondary | ICD-10-CM | POA: Diagnosis not present

## 2020-09-01 DIAGNOSIS — Z7984 Long term (current) use of oral hypoglycemic drugs: Secondary | ICD-10-CM | POA: Diagnosis not present

## 2020-09-01 DIAGNOSIS — M48061 Spinal stenosis, lumbar region without neurogenic claudication: Secondary | ICD-10-CM | POA: Diagnosis not present

## 2020-09-01 DIAGNOSIS — E7800 Pure hypercholesterolemia, unspecified: Secondary | ICD-10-CM | POA: Diagnosis not present

## 2020-09-01 DIAGNOSIS — G6181 Chronic inflammatory demyelinating polyneuritis: Secondary | ICD-10-CM | POA: Diagnosis not present

## 2020-09-04 ENCOUNTER — Other Ambulatory Visit: Payer: Self-pay | Admitting: Family Medicine

## 2020-09-08 ENCOUNTER — Telehealth: Payer: Self-pay | Admitting: Family Medicine

## 2020-09-08 NOTE — Telephone Encounter (Signed)
Done. Susie Ehresman,cma  

## 2020-09-08 NOTE — Telephone Encounter (Signed)
Noted.  Please print this out and place it with his snapshot so I can have it during the day of his visit.  Thanks.

## 2020-09-08 NOTE — Telephone Encounter (Signed)
Cletus Gash from [employer] called about wheelchair [phone]

## 2020-09-08 NOTE — Telephone Encounter (Signed)
I called back to Cletus Gash of [employer] power wheelchair and he stated that the patient is coming in for a visit on 09/11/2020 and there are some important things that needs to be put in the note in order to expedite the power wheelchair.  In the note it must stat " Patient needs Power wheelchair for ADL." The telehealth visit must be on vide, and the note must be signed or have a electronic signature and it needs to be faxed to [employer] 2 [phone] attention Cletus Gash.

## 2020-09-09 DIAGNOSIS — F329 Major depressive disorder, single episode, unspecified: Secondary | ICD-10-CM | POA: Diagnosis not present

## 2020-09-09 DIAGNOSIS — M48061 Spinal stenosis, lumbar region without neurogenic claudication: Secondary | ICD-10-CM | POA: Diagnosis not present

## 2020-09-09 DIAGNOSIS — E1144 Type 2 diabetes mellitus with diabetic amyotrophy: Secondary | ICD-10-CM | POA: Diagnosis not present

## 2020-09-09 DIAGNOSIS — I10 Essential (primary) hypertension: Secondary | ICD-10-CM | POA: Diagnosis not present

## 2020-09-09 DIAGNOSIS — Z8616 Personal history of COVID-19: Secondary | ICD-10-CM | POA: Diagnosis not present

## 2020-09-09 DIAGNOSIS — Z7984 Long term (current) use of oral hypoglycemic drugs: Secondary | ICD-10-CM | POA: Diagnosis not present

## 2020-09-09 DIAGNOSIS — G6181 Chronic inflammatory demyelinating polyneuritis: Secondary | ICD-10-CM | POA: Diagnosis not present

## 2020-09-09 DIAGNOSIS — E7800 Pure hypercholesterolemia, unspecified: Secondary | ICD-10-CM | POA: Diagnosis not present

## 2020-09-09 DIAGNOSIS — N400 Enlarged prostate without lower urinary tract symptoms: Secondary | ICD-10-CM | POA: Diagnosis not present

## 2020-09-10 DIAGNOSIS — G6181 Chronic inflammatory demyelinating polyneuritis: Secondary | ICD-10-CM | POA: Diagnosis not present

## 2020-09-10 DIAGNOSIS — M48061 Spinal stenosis, lumbar region without neurogenic claudication: Secondary | ICD-10-CM | POA: Diagnosis not present

## 2020-09-10 DIAGNOSIS — I10 Essential (primary) hypertension: Secondary | ICD-10-CM | POA: Diagnosis not present

## 2020-09-10 DIAGNOSIS — E1144 Type 2 diabetes mellitus with diabetic amyotrophy: Secondary | ICD-10-CM | POA: Diagnosis not present

## 2020-09-10 DIAGNOSIS — F329 Major depressive disorder, single episode, unspecified: Secondary | ICD-10-CM | POA: Diagnosis not present

## 2020-09-10 DIAGNOSIS — Z7984 Long term (current) use of oral hypoglycemic drugs: Secondary | ICD-10-CM | POA: Diagnosis not present

## 2020-09-10 DIAGNOSIS — N400 Enlarged prostate without lower urinary tract symptoms: Secondary | ICD-10-CM | POA: Diagnosis not present

## 2020-09-10 DIAGNOSIS — Z8616 Personal history of COVID-19: Secondary | ICD-10-CM | POA: Diagnosis not present

## 2020-09-10 DIAGNOSIS — E7800 Pure hypercholesterolemia, unspecified: Secondary | ICD-10-CM | POA: Diagnosis not present

## 2020-09-11 ENCOUNTER — Telehealth: Payer: Medicare HMO | Admitting: Family Medicine

## 2020-09-11 ENCOUNTER — Other Ambulatory Visit: Payer: Self-pay

## 2020-09-11 ENCOUNTER — Encounter: Payer: Self-pay | Admitting: Family Medicine

## 2020-09-11 DIAGNOSIS — M7542 Impingement syndrome of left shoulder: Secondary | ICD-10-CM | POA: Diagnosis not present

## 2020-09-11 DIAGNOSIS — S39012D Strain of muscle, fascia and tendon of lower back, subsequent encounter: Secondary | ICD-10-CM | POA: Diagnosis not present

## 2020-09-11 DIAGNOSIS — M6281 Muscle weakness (generalized): Secondary | ICD-10-CM | POA: Diagnosis not present

## 2020-09-11 DIAGNOSIS — R531 Weakness: Secondary | ICD-10-CM | POA: Diagnosis not present

## 2020-09-11 DIAGNOSIS — G6181 Chronic inflammatory demyelinating polyneuritis: Secondary | ICD-10-CM | POA: Diagnosis not present

## 2020-09-11 NOTE — Progress Notes (Deleted)
Virtual Visit via video Note  This visit type was conducted due to national recommendations for restrictions regarding the COVID-19 pandemic (e.g. social distancing).  This format is felt to be most appropriate for this patient at this time.  All issues noted in this document were discussed and addressed.  No physical exam was performed (except for noted visual exam findings with Video Visits).   I connected with Michael Montgomery today at  8:00 AM EST by a video enabled telemedicine application and verified that I am speaking with the correct person using two identifiers. Location patient: home Location provider: work  Persons participating in the virtual visit: patient, provider  I discussed the limitations, risks, security and privacy concerns of performing an evaluation and management service by telephone and the availability of in person appointments. I also discussed with the patient that there may be a patient responsible charge related to this service. The patient expressed understanding and agreed to proceed.  Interactive audio and video telecommunications were attempted between this provider and patient, however failed, due to patient having technical difficulties OR patient did not have access to video capability.  We continued and completed visit with audio only. ***  Reason for visit: f/u  HPI: ***   ROS: See pertinent positives and negatives per HPI.  Past Medical History:  Diagnosis Date  . Cataract march, april   bilateral removed   . Chickenpox   . Diabetes mellitus without complication (HCC)    metformin   . Diabetic amyotrophy [facility])    sees dr at [facility]  . Gastric ulcer   . Heart murmur    When he was younger, no recent issues  . High blood pressure    controlled with meds  . Hypercholesteremia    controlled with medication  . Knee injury    left  . Motion sickness    boats  . Neuropathy     Past Surgical History:  Procedure Laterality Date  . CATARACT  EXTRACTION W/PHACO Right 02/01/2016   Procedure: CATARACT EXTRACTION PHACO AND INTRAOCULAR LENS PLACEMENT ([place]) right;  Surgeon: Ronnell Freshwater, MD;  Location: [place];  Service: Ophthalmology;  Laterality: Right;  DIABETIC - oral meds  . CATARACT EXTRACTION W/PHACO Left 03/07/2016   Procedure: CATARACT EXTRACTION PHACO AND INTRAOCULAR LENS PLACEMENT ([facility]);  Surgeon: Ronnell Freshwater, MD;  Location: [place];  Service: Ophthalmology;  Laterality: Left;  DIABETIC - oral meds   . circumscision      Family History  Problem Relation Age of Onset  . Alcoholism Father   . Hyperlipidemia Father   . Hypertension Father   . Throat cancer Father        smoked but had stopped 20 yeras before dx  . Diabetes Mother   . Emphysema Mother   . Kidney disease Neg Hx   . Prostate cancer Neg Hx   . Colon cancer Neg Hx   . Colon polyps Neg Hx   . Esophageal cancer Neg Hx   . Rectal cancer Neg Hx   . Stomach cancer Neg Hx   . Kidney cancer Neg Hx   . Bladder Cancer Neg Hx     SOCIAL HX: ***   Current Outpatient Medications:  .  albuterol (VENTOLIN HFA) 108 (90 Base) MCG/ACT inhaler, Inhale 2-4 puffs by mouth every 4 hours as needed for wheezing, cough, and/or shortness of breath, Disp: 8 g, Rfl: 1 .  amLODipine (NORVASC) 10 MG tablet, TAKE 1 TABLET BY MOUTH  EVERY DAY, Disp: 30 tablet, Rfl: 0 .  atorvastatin (LIPITOR) 40 MG tablet, TAKE 1 TABLET BY MOUTH DAILY, Disp: 30 tablet, Rfl: 1 .  clomiPHENE (CLOMID) 50 MG tablet, Take 1/2 tablet daily, Disp: 30 tablet, Rfl: 3 .  fluticasone (FLONASE) 50 MCG/ACT nasal spray, Place 2 sprays into both nostrils daily., Disp: 16 g, Rfl: 6 .  gabapentin (NEURONTIN) 300 MG capsule, Take 1 capsule by mouth 2 (two) times daily. Take 1 cap in AM and 2 caps at bedtime, Disp: , Rfl:  .  GAMUNEX-C 20 GM/200ML SOLN, , Disp: , Rfl:  .  JARDIANCE 10 MG TABS tablet, TAKE 1 TABLET BY MOUTH DAILY BEFORE BREAKFAST, Disp: 30 tablet, Rfl:  2 .  LINZESS 72 MCG capsule, TAKE 1 CAPSULE (72 MCG TOTAL) BY MOUTH DAILY BEFORE BREAKFAST., Disp: 30 capsule, Rfl: 1 .  loratadine (CLARITIN) 10 MG tablet, TAKE 1 TABLET EVERY DAY, Disp: 30 tablet, Rfl: 1 .  losartan (COZAAR) 100 MG tablet, TAKE 1 TABLET BY MOUTH EVERY DAY (Patient taking differently: Take 100 mg by mouth daily. ), Disp: 90 tablet, Rfl: 3 .  metFORMIN (GLUCOPHAGE-XR) 500 MG 24 hr tablet, TAKE 3 TABLETS (1,500 MG TOTAL) BY MOUTH DAILY WITH BREAKFAST., Disp: 270 tablet, Rfl: 1 .  Multiple Vitamin (MULTIVITAMIN) capsule, Take 1 capsule by mouth daily., Disp: , Rfl:  .  Nutritional Supplements (ENSURE ACTIVE HIGH PROTEIN) LIQD, Take 1 Can by mouth 3 (three) times daily as needed., Disp: 21330 mL, Rfl: 11 .  Omega-3 Fatty Acids (FISH OIL PO), Take 1 capsule by mouth daily. , Disp: , Rfl:  .  omeprazole (PRILOSEC) 20 MG capsule, TAKE 1 CAPSULE BY MOUTH EVERY DAY, Disp: 90 capsule, Rfl: 1 .  ondansetron (ZOFRAN ODT) 4 MG disintegrating tablet, Allow 1-2 tablets to dissolve in your mouth every 8 hours as needed for nausea/vomiting, Disp: 30 tablet, Rfl: 0 .  sertraline (ZOLOFT) 50 MG tablet, Take 1 tablet (50 mg total) by mouth daily for 14 days, THEN 2 tablets (100 mg total) daily., Disp: 180 tablet, Rfl: 1 .  sildenafil (VIAGRA) 50 MG tablet, Take 50 mg by mouth as needed for erectile dysfunction. , Disp: , Rfl:   EXAM:  VITALS per patient if applicable:  GENERAL: alert, oriented, appears well and in no acute distress  HEENT: atraumatic, conjunttiva clear, no obvious abnormalities on inspection of external nose and ears  NECK: normal movements of the head and neck  LUNGS: on inspection no signs of respiratory distress, breathing rate appears normal, no obvious gross SOB, gasping or wheezing  CV: no obvious cyanosis  MS: moves all visible extremities without noticeable abnormality  PSYCH/NEURO: pleasant and cooperative, no obvious depression or anxiety, speech and thought  processing grossly intact  ASSESSMENT AND PLAN:  Discussed the following assessment and plan:  Problem List Items Addressed This Visit    None       I discussed the assessment and treatment plan with the patient. The patient was provided an opportunity to ask questions and all were answered. The patient agreed with the plan and demonstrated an understanding of the instructions.   The patient was advised to call back or seek an in-person evaluation if the symptoms worsen or if the condition fails to improve as anticipated.  I provided *** minutes of non-face-to-face time during this encounter.   [name], MD

## 2020-09-11 NOTE — Progress Notes (Signed)
Patient was unable to get on the video visit. A video visit is required for wheel chair evaluation. He was rescheduled for next week.

## 2020-09-12 DIAGNOSIS — L97519 Non-pressure chronic ulcer of other part of right foot with unspecified severity: Secondary | ICD-10-CM | POA: Diagnosis not present

## 2020-09-12 DIAGNOSIS — I10 Essential (primary) hypertension: Secondary | ICD-10-CM | POA: Diagnosis not present

## 2020-09-12 DIAGNOSIS — R29898 Other symptoms and signs involving the musculoskeletal system: Secondary | ICD-10-CM | POA: Diagnosis not present

## 2020-09-12 DIAGNOSIS — S39012D Strain of muscle, fascia and tendon of lower back, subsequent encounter: Secondary | ICD-10-CM | POA: Diagnosis not present

## 2020-09-12 DIAGNOSIS — E1144 Type 2 diabetes mellitus with diabetic amyotrophy: Secondary | ICD-10-CM | POA: Diagnosis not present

## 2020-09-12 DIAGNOSIS — G6181 Chronic inflammatory demyelinating polyneuritis: Secondary | ICD-10-CM | POA: Diagnosis not present

## 2020-09-12 DIAGNOSIS — E785 Hyperlipidemia, unspecified: Secondary | ICD-10-CM | POA: Diagnosis not present

## 2020-09-12 DIAGNOSIS — M7542 Impingement syndrome of left shoulder: Secondary | ICD-10-CM | POA: Diagnosis not present

## 2020-09-14 DIAGNOSIS — E7800 Pure hypercholesterolemia, unspecified: Secondary | ICD-10-CM | POA: Diagnosis not present

## 2020-09-14 DIAGNOSIS — G6181 Chronic inflammatory demyelinating polyneuritis: Secondary | ICD-10-CM | POA: Diagnosis not present

## 2020-09-14 DIAGNOSIS — Z7984 Long term (current) use of oral hypoglycemic drugs: Secondary | ICD-10-CM | POA: Diagnosis not present

## 2020-09-14 DIAGNOSIS — M48061 Spinal stenosis, lumbar region without neurogenic claudication: Secondary | ICD-10-CM | POA: Diagnosis not present

## 2020-09-14 DIAGNOSIS — I10 Essential (primary) hypertension: Secondary | ICD-10-CM | POA: Diagnosis not present

## 2020-09-14 DIAGNOSIS — N400 Enlarged prostate without lower urinary tract symptoms: Secondary | ICD-10-CM | POA: Diagnosis not present

## 2020-09-14 DIAGNOSIS — E1144 Type 2 diabetes mellitus with diabetic amyotrophy: Secondary | ICD-10-CM | POA: Diagnosis not present

## 2020-09-14 DIAGNOSIS — Z8616 Personal history of COVID-19: Secondary | ICD-10-CM | POA: Diagnosis not present

## 2020-09-14 DIAGNOSIS — F329 Major depressive disorder, single episode, unspecified: Secondary | ICD-10-CM | POA: Diagnosis not present

## 2020-09-15 DIAGNOSIS — Z7984 Long term (current) use of oral hypoglycemic drugs: Secondary | ICD-10-CM | POA: Diagnosis not present

## 2020-09-15 DIAGNOSIS — M48061 Spinal stenosis, lumbar region without neurogenic claudication: Secondary | ICD-10-CM | POA: Diagnosis not present

## 2020-09-15 DIAGNOSIS — E1144 Type 2 diabetes mellitus with diabetic amyotrophy: Secondary | ICD-10-CM | POA: Diagnosis not present

## 2020-09-15 DIAGNOSIS — N400 Enlarged prostate without lower urinary tract symptoms: Secondary | ICD-10-CM | POA: Diagnosis not present

## 2020-09-15 DIAGNOSIS — G6181 Chronic inflammatory demyelinating polyneuritis: Secondary | ICD-10-CM | POA: Diagnosis not present

## 2020-09-15 DIAGNOSIS — F329 Major depressive disorder, single episode, unspecified: Secondary | ICD-10-CM | POA: Diagnosis not present

## 2020-09-15 DIAGNOSIS — E7800 Pure hypercholesterolemia, unspecified: Secondary | ICD-10-CM | POA: Diagnosis not present

## 2020-09-15 DIAGNOSIS — I10 Essential (primary) hypertension: Secondary | ICD-10-CM | POA: Diagnosis not present

## 2020-09-15 DIAGNOSIS — Z8616 Personal history of COVID-19: Secondary | ICD-10-CM | POA: Diagnosis not present

## 2020-09-16 ENCOUNTER — Telehealth (INDEPENDENT_AMBULATORY_CARE_PROVIDER_SITE_OTHER): Payer: Medicare HMO | Admitting: Family Medicine

## 2020-09-16 ENCOUNTER — Encounter: Payer: Self-pay | Admitting: Family Medicine

## 2020-09-16 ENCOUNTER — Other Ambulatory Visit: Payer: Self-pay

## 2020-09-16 DIAGNOSIS — G6181 Chronic inflammatory demyelinating polyneuritis: Secondary | ICD-10-CM

## 2020-09-16 NOTE — Assessment & Plan Note (Signed)
Chronic issue with ongoing leg weakness.  The patient would benefit from a power wheelchair to help him accomplish the ADLs as outlined in the HPI.  This note will be submitted to [employer] to help get the patient a power wheelchair.  He will continue to follow with neurology.  He will continue physical therapy and Occupational Therapy.  I encouraged him to follow-up on several of the contacts that the clinical social worker gave him regarding help for a wheelchair ramp.

## 2020-09-16 NOTE — Progress Notes (Signed)
Virtual Visit via video Note  This visit type was conducted due to national recommendations for restrictions regarding the COVID-19 pandemic (e.g. social distancing).  This format is felt to be most appropriate for this patient at this time.  All issues noted in this document were discussed and addressed.  No physical exam was performed (except for noted visual exam findings with Video Visits).   I connected with Michael Montgomery today at  3:15 PM EST by a video enabled telemedicine application and verified that I am speaking with the correct person using two identifiers. Location patient: home Location provider: work Persons participating in the virtual visit: patient, provider  I discussed the limitations, risks, security and privacy concerns of performing an evaluation and management service by telephone and the availability of in person appointments. I also discussed with the patient that there may be a patient responsible charge related to this service. The patient expressed understanding and agreed to proceed.  Reason for visit: f/u.  HPI: CIDP/wheelchair evaluation: The patient CIDP is stable.  He has minimal use of his legs due to this neurological issue.  He continues on IVIG.  He is not able to transfer himself out of bed.  He needs a power wheelchair to help him accomplish ADLs including using the toilet, feeding and cooking, dressing himself, bathing, and transferring and moving around his house.  At this time he is unable to do these things without assistance.  He is undergoing physical therapy and occupational therapy to help with his deficits.  He does note his family is going to help him with getting a ramp built.   ROS: See pertinent positives and negatives per HPI.  Past Medical History:  Diagnosis Date  . Cataract march, april   bilateral removed   . Chickenpox   . Diabetes mellitus without complication (HCC)    metformin   . Diabetic amyotrophy [facility])    sees dr at  [facility]  . Gastric ulcer   . Heart murmur    When he was younger, no recent issues  . High blood pressure    controlled with meds  . Hypercholesteremia    controlled with medication  . Knee injury    left  . Motion sickness    boats  . Neuropathy     Past Surgical History:  Procedure Laterality Date  . CATARACT EXTRACTION W/PHACO Right 02/01/2016   Procedure: CATARACT EXTRACTION PHACO AND INTRAOCULAR LENS PLACEMENT ([place]) right;  Surgeon: Ronnell Freshwater, MD;  Location: [place];  Service: Ophthalmology;  Laterality: Right;  DIABETIC - oral meds  . CATARACT EXTRACTION W/PHACO Left 03/07/2016   Procedure: CATARACT EXTRACTION PHACO AND INTRAOCULAR LENS PLACEMENT ([facility]);  Surgeon: Ronnell Freshwater, MD;  Location: [place];  Service: Ophthalmology;  Laterality: Left;  DIABETIC - oral meds   . circumscision      Family History  Problem Relation Age of Onset  . Alcoholism Father   . Hyperlipidemia Father   . Hypertension Father   . Throat cancer Father        smoked but had stopped 20 yeras before dx  . Diabetes Mother   . Emphysema Mother   . Kidney disease Neg Hx   . Prostate cancer Neg Hx   . Colon cancer Neg Hx   . Colon polyps Neg Hx   . Esophageal cancer Neg Hx   . Rectal cancer Neg Hx   . Stomach cancer Neg Hx   . Kidney cancer Neg  Hx   . Bladder Cancer Neg Hx     SOCIAL HX: Former smoker   Current Outpatient Medications:  .  albuterol (VENTOLIN HFA) 108 (90 Base) MCG/ACT inhaler, Inhale 2-4 puffs by mouth every 4 hours as needed for wheezing, cough, and/or shortness of breath, Disp: 8 g, Rfl: 1 .  amLODipine (NORVASC) 10 MG tablet, TAKE 1 TABLET BY MOUTH EVERY DAY, Disp: 30 tablet, Rfl: 0 .  atorvastatin (LIPITOR) 40 MG tablet, TAKE 1 TABLET BY MOUTH DAILY, Disp: 30 tablet, Rfl: 1 .  clomiPHENE (CLOMID) 50 MG tablet, Take 1/2 tablet daily, Disp: 30 tablet, Rfl: 3 .  fluticasone (FLONASE) 50 MCG/ACT nasal spray, Place 2 sprays into  both nostrils daily., Disp: 16 g, Rfl: 6 .  gabapentin (NEURONTIN) 300 MG capsule, Take 1 capsule by mouth 2 (two) times daily. Take 1 cap in AM and 2 caps at bedtime, Disp: , Rfl:  .  GAMUNEX-C 20 GM/200ML SOLN, , Disp: , Rfl:  .  JARDIANCE 10 MG TABS tablet, TAKE 1 TABLET BY MOUTH DAILY BEFORE BREAKFAST, Disp: 30 tablet, Rfl: 2 .  LINZESS 72 MCG capsule, TAKE 1 CAPSULE (72 MCG TOTAL) BY MOUTH DAILY BEFORE BREAKFAST., Disp: 30 capsule, Rfl: 1 .  loratadine (CLARITIN) 10 MG tablet, TAKE 1 TABLET EVERY DAY, Disp: 30 tablet, Rfl: 1 .  losartan (COZAAR) 100 MG tablet, TAKE 1 TABLET BY MOUTH EVERY DAY (Patient taking differently: Take 100 mg by mouth daily. ), Disp: 90 tablet, Rfl: 3 .  metFORMIN (GLUCOPHAGE-XR) 500 MG 24 hr tablet, TAKE 3 TABLETS (1,500 MG TOTAL) BY MOUTH DAILY WITH BREAKFAST., Disp: 270 tablet, Rfl: 1 .  Multiple Vitamin (MULTIVITAMIN) capsule, Take 1 capsule by mouth daily., Disp: , Rfl:  .  Nutritional Supplements (ENSURE ACTIVE HIGH PROTEIN) LIQD, Take 1 Can by mouth 3 (three) times daily as needed., Disp: 21330 mL, Rfl: 11 .  Omega-3 Fatty Acids (FISH OIL PO), Take 1 capsule by mouth daily. , Disp: , Rfl:  .  omeprazole (PRILOSEC) 20 MG capsule, TAKE 1 CAPSULE BY MOUTH EVERY DAY, Disp: 90 capsule, Rfl: 1 .  ondansetron (ZOFRAN ODT) 4 MG disintegrating tablet, Allow 1-2 tablets to dissolve in your mouth every 8 hours as needed for nausea/vomiting, Disp: 30 tablet, Rfl: 0 .  sertraline (ZOLOFT) 50 MG tablet, Take 1 tablet (50 mg total) by mouth daily for 14 days, THEN 2 tablets (100 mg total) daily., Disp: 180 tablet, Rfl: 1 .  sildenafil (VIAGRA) 50 MG tablet, Take 50 mg by mouth as needed for erectile dysfunction. , Disp: , Rfl:   EXAM:  VITALS per patient if applicable:  GENERAL: alert, oriented, appears well and in no acute distress  HEENT: atraumatic, conjunttiva clear, no obvious abnormalities on inspection of external nose and ears  NECK: normal movements of the head  and neck  LUNGS: on inspection no signs of respiratory distress, breathing rate appears normal, no obvious gross SOB, gasping or wheezing  CV: no obvious cyanosis  PSYCH/NEURO: pleasant and cooperative, no obvious depression or anxiety, speech and thought processing grossly intact  ASSESSMENT AND PLAN:  Discussed the following assessment and plan:  Problem List Items Addressed This Visit    CIDP (chronic inflammatory demyelinating polyneuropathy) (HCC)    Chronic issue with ongoing leg weakness.  The patient would benefit from a power wheelchair to help him accomplish the ADLs as outlined in the HPI.  This note will be submitted to [employer] to help get the patient a power wheelchair.  He will continue to follow with neurology.  He will continue physical therapy and Occupational Therapy.  I encouraged him to follow-up on several of the contacts that the clinical social worker gave him regarding help for a wheelchair ramp.          I discussed the assessment and treatment plan with the patient. The patient was provided an opportunity to ask questions and all were answered. The patient agreed with the plan and demonstrated an understanding of the instructions.   The patient was advised to call back or seek an in-person evaluation if the symptoms worsen or if the condition fails to improve as anticipated.   [name], MD

## 2020-09-23 DIAGNOSIS — M48061 Spinal stenosis, lumbar region without neurogenic claudication: Secondary | ICD-10-CM | POA: Diagnosis not present

## 2020-09-23 DIAGNOSIS — G6181 Chronic inflammatory demyelinating polyneuritis: Secondary | ICD-10-CM | POA: Diagnosis not present

## 2020-09-23 DIAGNOSIS — N400 Enlarged prostate without lower urinary tract symptoms: Secondary | ICD-10-CM | POA: Diagnosis not present

## 2020-09-23 DIAGNOSIS — I10 Essential (primary) hypertension: Secondary | ICD-10-CM | POA: Diagnosis not present

## 2020-09-23 DIAGNOSIS — Z8616 Personal history of COVID-19: Secondary | ICD-10-CM | POA: Diagnosis not present

## 2020-09-23 DIAGNOSIS — E1144 Type 2 diabetes mellitus with diabetic amyotrophy: Secondary | ICD-10-CM | POA: Diagnosis not present

## 2020-09-23 DIAGNOSIS — Z7984 Long term (current) use of oral hypoglycemic drugs: Secondary | ICD-10-CM | POA: Diagnosis not present

## 2020-09-23 DIAGNOSIS — E7800 Pure hypercholesterolemia, unspecified: Secondary | ICD-10-CM | POA: Diagnosis not present

## 2020-09-23 DIAGNOSIS — F329 Major depressive disorder, single episode, unspecified: Secondary | ICD-10-CM | POA: Diagnosis not present

## 2020-09-24 ENCOUNTER — Telehealth: Payer: Self-pay | Admitting: Family Medicine

## 2020-09-24 NOTE — Telephone Encounter (Signed)
He does need labs, though I believe he would need them drawn by home health as I do not think he would be able to come in to the office. He needs an A1c, CMET, and lipid panel. Please check to see if home health can draw these and see if they can take a verbal order.

## 2020-09-24 NOTE — Telephone Encounter (Signed)
Patient called and wanted to know if he need to have any labs done. One lab he was wondering about is his A1c. No lab orders are in chart.

## 2020-09-25 ENCOUNTER — Telehealth: Payer: Self-pay

## 2020-09-25 NOTE — Telephone Encounter (Signed)
Michael Montgomery from [facility] wants to report a missed physical therapy visit. Pt wasn't feeling good yesterday and changed appointment to today but he cancelled today.

## 2020-09-25 NOTE — Telephone Encounter (Signed)
I called the patient and informed him that his home health stated that they did not draw blood form patient at home, patient stated he would call back when he can come in for labs.  Katherina Wimer,cma

## 2020-09-26 ENCOUNTER — Other Ambulatory Visit: Payer: Self-pay | Admitting: Family Medicine

## 2020-09-27 ENCOUNTER — Other Ambulatory Visit: Payer: Self-pay | Admitting: Family Medicine

## 2020-09-28 DIAGNOSIS — E7800 Pure hypercholesterolemia, unspecified: Secondary | ICD-10-CM | POA: Diagnosis not present

## 2020-09-28 DIAGNOSIS — F329 Major depressive disorder, single episode, unspecified: Secondary | ICD-10-CM | POA: Diagnosis not present

## 2020-09-28 DIAGNOSIS — M48061 Spinal stenosis, lumbar region without neurogenic claudication: Secondary | ICD-10-CM | POA: Diagnosis not present

## 2020-09-28 DIAGNOSIS — Z7984 Long term (current) use of oral hypoglycemic drugs: Secondary | ICD-10-CM | POA: Diagnosis not present

## 2020-09-28 DIAGNOSIS — I10 Essential (primary) hypertension: Secondary | ICD-10-CM | POA: Diagnosis not present

## 2020-09-28 DIAGNOSIS — N400 Enlarged prostate without lower urinary tract symptoms: Secondary | ICD-10-CM | POA: Diagnosis not present

## 2020-09-28 DIAGNOSIS — E1144 Type 2 diabetes mellitus with diabetic amyotrophy: Secondary | ICD-10-CM | POA: Diagnosis not present

## 2020-09-28 DIAGNOSIS — G6181 Chronic inflammatory demyelinating polyneuritis: Secondary | ICD-10-CM | POA: Diagnosis not present

## 2020-09-28 DIAGNOSIS — Z8616 Personal history of COVID-19: Secondary | ICD-10-CM | POA: Diagnosis not present

## 2020-09-29 DIAGNOSIS — G6181 Chronic inflammatory demyelinating polyneuritis: Secondary | ICD-10-CM | POA: Diagnosis not present

## 2020-10-02 ENCOUNTER — Telehealth: Payer: Self-pay

## 2020-10-02 NOTE — Telephone Encounter (Signed)
Cletus Gash from [employer] faxed over about 22 pages to be completed for pt a power wheelchair on Weds 09/30/20. He said if you need help with this or didn't get the forms to please give him a call.

## 2020-10-02 NOTE — Telephone Encounter (Signed)
Cletus Gash from [employer] faxed over about 22 pages to be completed for pt a power wheelchair on Weds 09/30/20. He said if you need help with this or didn't get the forms to please give him a call. the form is in the sign basket.  Kaina Orengo,cma

## 2020-10-06 NOTE — Telephone Encounter (Signed)
Signed. Please fax back. Thanks.

## 2020-10-09 NOTE — Telephone Encounter (Signed)
Faxed the forms on 10/08/2020, confirmation given.  Zhaniya Swallows,cma

## 2020-10-11 DIAGNOSIS — M7542 Impingement syndrome of left shoulder: Secondary | ICD-10-CM | POA: Diagnosis not present

## 2020-10-11 DIAGNOSIS — S39012D Strain of muscle, fascia and tendon of lower back, subsequent encounter: Secondary | ICD-10-CM | POA: Diagnosis not present

## 2020-10-11 DIAGNOSIS — R531 Weakness: Secondary | ICD-10-CM | POA: Diagnosis not present

## 2020-10-11 DIAGNOSIS — M6281 Muscle weakness (generalized): Secondary | ICD-10-CM | POA: Diagnosis not present

## 2020-10-11 DIAGNOSIS — G6181 Chronic inflammatory demyelinating polyneuritis: Secondary | ICD-10-CM | POA: Diagnosis not present

## 2020-10-12 DIAGNOSIS — G6181 Chronic inflammatory demyelinating polyneuritis: Secondary | ICD-10-CM | POA: Diagnosis not present

## 2020-10-12 DIAGNOSIS — L97519 Non-pressure chronic ulcer of other part of right foot with unspecified severity: Secondary | ICD-10-CM | POA: Diagnosis not present

## 2020-10-12 DIAGNOSIS — R29898 Other symptoms and signs involving the musculoskeletal system: Secondary | ICD-10-CM | POA: Diagnosis not present

## 2020-10-12 DIAGNOSIS — I10 Essential (primary) hypertension: Secondary | ICD-10-CM | POA: Diagnosis not present

## 2020-10-12 DIAGNOSIS — E785 Hyperlipidemia, unspecified: Secondary | ICD-10-CM | POA: Diagnosis not present

## 2020-10-12 DIAGNOSIS — M7542 Impingement syndrome of left shoulder: Secondary | ICD-10-CM | POA: Diagnosis not present

## 2020-10-12 DIAGNOSIS — S39012D Strain of muscle, fascia and tendon of lower back, subsequent encounter: Secondary | ICD-10-CM | POA: Diagnosis not present

## 2020-10-12 DIAGNOSIS — E1144 Type 2 diabetes mellitus with diabetic amyotrophy: Secondary | ICD-10-CM | POA: Diagnosis not present

## 2020-10-12 NOTE — Telephone Encounter (Signed)
Michael Montgomery with [employer] states that he did not receive forms. Please fax to [phone]

## 2020-10-12 NOTE — Telephone Encounter (Signed)
Forms re-faxed to [employer]  power chair ,22 pages, confirmation given.  Michael Montgomery,cma

## 2020-10-27 ENCOUNTER — Other Ambulatory Visit: Payer: Self-pay | Admitting: Family Medicine

## 2020-10-29 ENCOUNTER — Other Ambulatory Visit: Payer: Self-pay | Admitting: Family Medicine

## 2020-11-11 DIAGNOSIS — S39012D Strain of muscle, fascia and tendon of lower back, subsequent encounter: Secondary | ICD-10-CM | POA: Diagnosis not present

## 2020-11-11 DIAGNOSIS — M7542 Impingement syndrome of left shoulder: Secondary | ICD-10-CM | POA: Diagnosis not present

## 2020-11-11 DIAGNOSIS — G6181 Chronic inflammatory demyelinating polyneuritis: Secondary | ICD-10-CM | POA: Diagnosis not present

## 2020-11-11 DIAGNOSIS — R531 Weakness: Secondary | ICD-10-CM | POA: Diagnosis not present

## 2020-11-12 DIAGNOSIS — M7542 Impingement syndrome of left shoulder: Secondary | ICD-10-CM | POA: Diagnosis not present

## 2020-11-12 DIAGNOSIS — I10 Essential (primary) hypertension: Secondary | ICD-10-CM | POA: Diagnosis not present

## 2020-11-12 DIAGNOSIS — E785 Hyperlipidemia, unspecified: Secondary | ICD-10-CM | POA: Diagnosis not present

## 2020-11-12 DIAGNOSIS — S39012D Strain of muscle, fascia and tendon of lower back, subsequent encounter: Secondary | ICD-10-CM | POA: Diagnosis not present

## 2020-11-14 ENCOUNTER — Other Ambulatory Visit: Payer: Self-pay | Admitting: Family Medicine

## 2020-12-03 ENCOUNTER — Other Ambulatory Visit: Payer: Self-pay | Admitting: Family Medicine

## 2020-12-09 ENCOUNTER — Other Ambulatory Visit: Payer: Self-pay | Admitting: Family Medicine

## 2020-12-12 ENCOUNTER — Other Ambulatory Visit: Payer: Self-pay | Admitting: Family Medicine

## 2020-12-12 DIAGNOSIS — R531 Weakness: Secondary | ICD-10-CM | POA: Diagnosis not present

## 2020-12-12 DIAGNOSIS — G6181 Chronic inflammatory demyelinating polyneuritis: Secondary | ICD-10-CM | POA: Diagnosis not present

## 2020-12-12 DIAGNOSIS — S39012D Strain of muscle, fascia and tendon of lower back, subsequent encounter: Secondary | ICD-10-CM | POA: Diagnosis not present

## 2020-12-12 DIAGNOSIS — M7542 Impingement syndrome of left shoulder: Secondary | ICD-10-CM | POA: Diagnosis not present

## 2020-12-16 DIAGNOSIS — G6181 Chronic inflammatory demyelinating polyneuritis: Secondary | ICD-10-CM | POA: Diagnosis not present

## 2020-12-17 DIAGNOSIS — G6181 Chronic inflammatory demyelinating polyneuritis: Secondary | ICD-10-CM | POA: Diagnosis not present

## 2020-12-18 DIAGNOSIS — G6181 Chronic inflammatory demyelinating polyneuritis: Secondary | ICD-10-CM | POA: Diagnosis not present

## 2020-12-19 DIAGNOSIS — G6181 Chronic inflammatory demyelinating polyneuritis: Secondary | ICD-10-CM | POA: Diagnosis not present

## 2020-12-20 ENCOUNTER — Other Ambulatory Visit: Payer: Self-pay | Admitting: Family Medicine

## 2020-12-22 ENCOUNTER — Telehealth: Payer: Self-pay

## 2020-12-24 NOTE — Telephone Encounter (Signed)
The denial form stated a list that medicare requires and the form is in the lab basket.  Morris Longenecker,cma

## 2020-12-24 NOTE — Telephone Encounter (Signed)
Does it say why they denied it? I completed the paperwork for this back in December though I do not believe he completed the PT evaluation for the power wheel chair which I require for the mobility assessment. If anyone should need one it would be him. We can try and figure out how to get this approved for him once I am back in the office.

## 2021-01-01 ENCOUNTER — Other Ambulatory Visit: Payer: Self-pay | Admitting: Family Medicine

## 2021-01-02 NOTE — Telephone Encounter (Signed)
Noted.  Denial letter reviewed.  Please contact the patient and let him know that the wheelchair as ordered was denied.  I think we should be able to complete an appeal for him though if he wants [place] to do it he would have contact the insurance company as outlined in the letter to name [place] as a representative for him to complete the appeal.  If he would like to do this we can provide him with the number and then we can complete the appeal.  Did he complete a physical therapy evaluation for his wheelchair?

## 2021-01-03 ENCOUNTER — Other Ambulatory Visit: Payer: Self-pay | Admitting: Family Medicine

## 2021-01-05 NOTE — Telephone Encounter (Signed)
I called and spoke with the patient and he stated that [employer] did the physical therapy for the wheelchair and they are doing the apppeal for the wheelchair he stated that they have a department that handles all appeals.  I informed him that if he needed [place] to let us know.  [name],cma

## 2021-01-05 NOTE — Telephone Encounter (Signed)
Noted. We will wait and see if they need anything from [place].

## 2021-01-09 DIAGNOSIS — M7542 Impingement syndrome of left shoulder: Secondary | ICD-10-CM | POA: Diagnosis not present

## 2021-01-09 DIAGNOSIS — G6181 Chronic inflammatory demyelinating polyneuritis: Secondary | ICD-10-CM | POA: Diagnosis not present

## 2021-01-09 DIAGNOSIS — R531 Weakness: Secondary | ICD-10-CM | POA: Diagnosis not present

## 2021-01-09 DIAGNOSIS — S39012D Strain of muscle, fascia and tendon of lower back, subsequent encounter: Secondary | ICD-10-CM | POA: Diagnosis not present

## 2021-01-13 ENCOUNTER — Telehealth: Payer: Self-pay | Admitting: Family Medicine

## 2021-01-13 NOTE — Telephone Encounter (Signed)
Patient  Called in wanted DR.Sonnenberg to contact his therapist at [facility] about the mobile wheel chair

## 2021-01-21 NOTE — Telephone Encounter (Signed)
I called the patient and his VM is not set up.  Michael Montgomery,cma

## 2021-01-25 ENCOUNTER — Telehealth: Payer: Self-pay | Admitting: Family Medicine

## 2021-01-25 NOTE — Telephone Encounter (Signed)
I called to [facility] and they do not have anything to do with the wheelchair they will give him therapy if needed only.  Dearia Wilmouth,cma

## 2021-01-25 NOTE — Telephone Encounter (Signed)
Patient called wanting to know the status of his wheel chair. Please call him.

## 2021-01-26 ENCOUNTER — Other Ambulatory Visit: Payer: Self-pay | Admitting: Family Medicine

## 2021-02-02 NOTE — Telephone Encounter (Signed)
Please reach out to the patient.  I received a fax regarding his electric wheelchair and they need additional information which I am not sure I can provide without an in office visit.  Is there any way we can get him into the office for a visit as they need spasticity and tone measurements which I have not been able to do since I have not seen him in person since 2020.

## 2021-02-03 ENCOUNTER — Other Ambulatory Visit: Payer: Self-pay | Admitting: Family Medicine

## 2021-02-03 NOTE — Telephone Encounter (Signed)
I called and spoke with the patient and informed him he needed a office visit, he is calling to get the transportation together and he will call and schedule to see the provider.  Michael Montgomery,cma

## 2021-02-08 ENCOUNTER — Other Ambulatory Visit: Payer: Self-pay | Admitting: Family Medicine

## 2021-02-09 DIAGNOSIS — M7542 Impingement syndrome of left shoulder: Secondary | ICD-10-CM | POA: Diagnosis not present

## 2021-02-09 DIAGNOSIS — S39012D Strain of muscle, fascia and tendon of lower back, subsequent encounter: Secondary | ICD-10-CM | POA: Diagnosis not present

## 2021-02-09 DIAGNOSIS — G6181 Chronic inflammatory demyelinating polyneuritis: Secondary | ICD-10-CM | POA: Diagnosis not present

## 2021-02-09 DIAGNOSIS — R531 Weakness: Secondary | ICD-10-CM | POA: Diagnosis not present

## 2021-02-10 DIAGNOSIS — G6181 Chronic inflammatory demyelinating polyneuritis: Secondary | ICD-10-CM | POA: Diagnosis not present

## 2021-02-12 ENCOUNTER — Telehealth: Payer: Self-pay

## 2021-02-12 DIAGNOSIS — G6181 Chronic inflammatory demyelinating polyneuritis: Secondary | ICD-10-CM | POA: Diagnosis not present

## 2021-02-12 NOTE — Telephone Encounter (Signed)
I called and spoke with the patient and he stated he is working with transportation and waiting to hear from them as soon as he does he will call nad schedule a in person visit with the provider.  Kennan Detter,cma

## 2021-02-12 NOTE — Progress Notes (Deleted)
02/15/2021 11:45 AM   Michael Montgomery 08-04-1961 [id number]  Referring provider: Leone Haven, MD [address] [place],  [place] [address]  No chief complaint on file.  Urological history: 1. Testosterone deficiency -had been on Clomid in the past  2. ED -contributing factors of age, BPH, smoking, diabetes, HTN and HLD -SHIM *** -had been on sildenafil in the past  3. BPH with LU TS -I PSS *** -PVR ***  4. Balanitis -s/p circumcision 2018   HPI: Michael Montgomery is a 60 y.o. male who presents today to discuss ED.  PMH: Past Medical History:  Diagnosis Date  . Cataract march, april   bilateral removed   . Chickenpox   . Diabetes mellitus without complication (HCC)    metformin   . Diabetic amyotrophy [facility])    sees dr at [facility]  . Gastric ulcer   . Heart murmur    When he was younger, no recent issues  . High blood pressure    controlled with meds  . Hypercholesteremia    controlled with medication  . Knee injury    left  . Motion sickness    boats  . Neuropathy     Surgical History: Past Surgical History:  Procedure Laterality Date  . CATARACT EXTRACTION W/PHACO Right 02/01/2016   Procedure: CATARACT EXTRACTION PHACO AND INTRAOCULAR LENS PLACEMENT ([place]) right;  Surgeon: Ronnell Freshwater, MD;  Location: [place];  Service: Ophthalmology;  Laterality: Right;  DIABETIC - oral meds  . CATARACT EXTRACTION W/PHACO Left 03/07/2016   Procedure: CATARACT EXTRACTION PHACO AND INTRAOCULAR LENS PLACEMENT ([facility]);  Surgeon: Ronnell Freshwater, MD;  Location: [place];  Service: Ophthalmology;  Laterality: Left;  DIABETIC - oral meds   . circumscision      Home Medications:  Allergies as of 02/15/2021   No Known Allergies     Medication List       Accurate as of February 12, 2021 11:45 AM. If you have any questions, ask your nurse or doctor.        albuterol 108 (90 Base) MCG/ACT inhaler Commonly known  as: VENTOLIN HFA Inhale 2-4 puffs by mouth every 4 hours as needed for wheezing, cough, and/or shortness of breath   amLODipine 10 MG tablet Commonly known as: NORVASC TAKE 1 TABLET BY MOUTH EVERY DAY   atorvastatin 40 MG tablet Commonly known as: LIPITOR TAKE 1 TABLET BY MOUTH EVERY DAY   clomiPHENE 50 MG tablet Commonly known as: CLOMID Take 1/2 tablet daily   Ensure Active High Protein Liqd Take 1 Can by mouth 3 (three) times daily as needed.   FISH OIL PO Take 1 capsule by mouth daily.   fluticasone 50 MCG/ACT nasal spray Commonly known as: FLONASE SPRAY 2 SPRAYS INTO EACH NOSTRIL EVERY DAY   gabapentin 300 MG capsule Commonly known as: NEURONTIN Take 1 capsule by mouth 2 (two) times daily. Take 1 cap in AM and 2 caps at bedtime   Gamunex-C 20 GM/200ML Soln Generic drug: Immune Globulin (Human)   Jardiance 10 MG Tabs tablet Generic drug: empagliflozin TAKE 1 TABLET BY MOUTH DAILY BEFORE BREAKFAST   Linzess 72 MCG capsule Generic drug: linaclotide TAKE 1 CAPSULE (72 MCG TOTAL) BY MOUTH DAILY BEFORE BREAKFAST.   loratadine 10 MG tablet Commonly known as: CLARITIN TAKE 1 TABLET EVERY DAY   losartan 100 MG tablet Commonly known as: COZAAR TAKE 1 TABLET BY MOUTH EVERY DAY   metFORMIN 500 MG 24  hr tablet Commonly known as: GLUCOPHAGE-XR TAKE 3 TABLETS (1,500 MG TOTAL) BY MOUTH DAILY WITH BREAKFAST.   multivitamin capsule Take 1 capsule by mouth daily.   omeprazole 20 MG capsule Commonly known as: PRILOSEC TAKE 1 CAPSULE BY MOUTH EVERY DAY   ondansetron 4 MG disintegrating tablet Commonly known as: ZOFRAN-ODT DISSOLVE 1 TO 2 TABLETS ON THE TONGUE EVERY 8 HOURS AS NEEDED FOR NAUSEA/VOMITING   sertraline 50 MG tablet Commonly known as: ZOLOFT TAKE 1 TABLET (50 MG TOTAL) BY MOUTH DAILY FOR 14 DAYS, THEN 2 TABLETS (100 MG TOTAL) DAILY. Start taking on: October 29, 2020   Viagra 50 MG tablet Generic drug: sildenafil Take 50 mg by mouth as needed for  erectile dysfunction.       Allergies: No Known Allergies  Family History: Family History  Problem Relation Age of Onset  . Alcoholism Father   . Hyperlipidemia Father   . Hypertension Father   . Throat cancer Father        smoked but had stopped 20 yeras before dx  . Diabetes Mother   . Emphysema Mother   . Kidney disease Neg Hx   . Prostate cancer Neg Hx   . Colon cancer Neg Hx   . Colon polyps Neg Hx   . Esophageal cancer Neg Hx   . Rectal cancer Neg Hx   . Stomach cancer Neg Hx   . Kidney cancer Neg Hx   . Bladder Cancer Neg Hx     Social History:  reports that he has quit smoking. His smoking use included cigars. He has never used smokeless tobacco. He reports current alcohol use of about 5.0 standard drinks of alcohol per week. He reports that he does not use drugs.  ROS: Pertinent ROS in HPI  Physical Exam: There were no vitals taken for this visit.  Constitutional:  Well nourished. Alert and oriented, No acute distress. HEENT: [place] AT, moist mucus membranes.  Trachea midline, no masses. Cardiovascular: No clubbing, cyanosis, or edema. Respiratory: Normal respiratory effort, no increased work of breathing. GI: Abdomen is soft, non tender, non distended, no abdominal masses. Liver and spleen not palpable.  No hernias appreciated.  Stool sample for occult testing is not indicated.   GU: No CVA tenderness.  No bladder fullness or masses.  Patient with circumcised/uncircumcised phallus. ***Foreskin easily retracted***  Urethral meatus is patent.  No penile discharge. No penile lesions or rashes. Scrotum without lesions, cysts, rashes and/or edema.  Testicles are located scrotally bilaterally. No masses are appreciated in the testicles. Left and right epididymis are normal. Rectal: Patient with  normal sphincter tone. Anus and perineum without scarring or rashes. No rectal masses are appreciated. Prostate is approximately *** grams, *** nodules are appreciated. Seminal  vesicles are normal. Skin: No rashes, bruises or suspicious lesions. Lymph: No cervical or inguinal adenopathy. Neurologic: Grossly intact, no focal deficits, moving all 4 extremities. Psychiatric: Normal mood and affect.  Laboratory Data: Urinalysis ***  I have reviewed the labs.   Pertinent Imaging: @CT @ @ultrasound @ @KUB @ I have independently reviewed the films.    Assessment & Plan:  ***  1. Testosterone deficiency -testosterone pending -If it returns below 300 we will need to repeat morning labs  2. ED ***  3. BPH with LU TS ***  4. Balanitis  ***  No follow-ups on file.  These notes generated with voice recognition software. I apologize for typographical errors.  Zara Council, [facility] [address]  [place], [place] [address] [phone]

## 2021-02-12 NOTE — Telephone Encounter (Signed)
Noted  

## 2021-02-12 NOTE — Telephone Encounter (Signed)
We have looked at this previously. He needs an in person visit for me to do an exam to complete this paperwork. I have not seen him in person in over a year and thus can not comment on the neurological exam questions that they asked about. Please try to get him in to the office for an appointment.

## 2021-02-13 DIAGNOSIS — G6181 Chronic inflammatory demyelinating polyneuritis: Secondary | ICD-10-CM | POA: Diagnosis not present

## 2021-02-14 DIAGNOSIS — G6181 Chronic inflammatory demyelinating polyneuritis: Secondary | ICD-10-CM | POA: Diagnosis not present

## 2021-02-15 ENCOUNTER — Ambulatory Visit: Payer: Medicare HMO | Admitting: Urology

## 2021-03-02 ENCOUNTER — Other Ambulatory Visit: Payer: Self-pay | Admitting: Family Medicine

## 2021-03-06 ENCOUNTER — Other Ambulatory Visit: Payer: Self-pay | Admitting: Family Medicine

## 2021-03-10 ENCOUNTER — Other Ambulatory Visit: Payer: Self-pay

## 2021-03-10 LAB — HM HEPATITIS C SCREENING LAB: HM Hepatitis Screen: NEGATIVE

## 2021-03-11 DIAGNOSIS — G6181 Chronic inflammatory demyelinating polyneuritis: Secondary | ICD-10-CM | POA: Diagnosis not present

## 2021-03-11 DIAGNOSIS — M7542 Impingement syndrome of left shoulder: Secondary | ICD-10-CM | POA: Diagnosis not present

## 2021-03-11 DIAGNOSIS — R531 Weakness: Secondary | ICD-10-CM | POA: Diagnosis not present

## 2021-03-11 DIAGNOSIS — S39012D Strain of muscle, fascia and tendon of lower back, subsequent encounter: Secondary | ICD-10-CM | POA: Diagnosis not present

## 2021-03-18 ENCOUNTER — Ambulatory Visit: Payer: Medicare HMO | Admitting: Family Medicine

## 2021-03-30 ENCOUNTER — Other Ambulatory Visit: Payer: Self-pay | Admitting: Family Medicine

## 2021-04-06 DIAGNOSIS — G6181 Chronic inflammatory demyelinating polyneuritis: Secondary | ICD-10-CM | POA: Diagnosis not present

## 2021-04-09 ENCOUNTER — Telehealth (INDEPENDENT_AMBULATORY_CARE_PROVIDER_SITE_OTHER): Payer: Medicare Other | Admitting: Family Medicine

## 2021-04-09 ENCOUNTER — Other Ambulatory Visit: Payer: Self-pay

## 2021-04-09 ENCOUNTER — Encounter: Payer: Self-pay | Admitting: Family Medicine

## 2021-04-09 DIAGNOSIS — G6181 Chronic inflammatory demyelinating polyneuritis: Secondary | ICD-10-CM

## 2021-04-09 DIAGNOSIS — E119 Type 2 diabetes mellitus without complications: Secondary | ICD-10-CM | POA: Diagnosis not present

## 2021-04-09 DIAGNOSIS — F330 Major depressive disorder, recurrent, mild: Secondary | ICD-10-CM | POA: Diagnosis not present

## 2021-04-09 NOTE — Assessment & Plan Note (Signed)
Chronic issue.  He needs follow-up for an in person physical exam for his electric wheelchair paperwork.  We will get this scheduled.  I encouraged him to contact his neurology team to get scheduled for follow-up.

## 2021-04-09 NOTE — Assessment & Plan Note (Signed)
Generally well controlled.  He will continue Zoloft 100 mg once daily.  He will monitor.

## 2021-04-09 NOTE — Assessment & Plan Note (Signed)
Seems to be very well controlled.  We will plan on checking an A1c when he is in the office next.  He will continue Jardiance 10 mg once daily.  Discussed the potential for backing off on this.

## 2021-04-09 NOTE — Progress Notes (Signed)
Virtual Visit via video Note  This visit type was conducted due to national recommendations for restrictions regarding the COVID-19 pandemic (e.g. social distancing).  This format is felt to be most appropriate for this patient at this time.  All issues noted in this document were discussed and addressed.  No physical exam was performed (except for noted visual exam findings with Video Visits).   I connected with Michael Montgomery today at  1:15 PM EDT by a video enabled telemedicine application and verified that I am speaking with the correct person using two identifiers. Location patient: home Location provider: work Persons participating in the virtual visit: patient, provider  I discussed the limitations, risks, security and privacy concerns of performing an evaluation and management service by telephone and the availability of in person appointments. I also discussed with the patient that there may be a patient responsible charge related to this service. The patient expressed understanding and agreed to proceed.  Reason for visit: f/u  HPI: DIABETES Disease Monitoring: Blood Sugar ranges-80s-90s, the patient reports his A1c through his insurance company was in the 5s polyuria/phagia/dipsia- no      Medications: Compliance- taking jardiance Hypoglycemic symptoms- occasionally he will feel weak, he will eat some candy or drink some juice and feel better.   CIDP: The patient has been unable to get into the office for an in person exam to complete his wheelchair evaluation.  He is no longer doing physical therapy or occupational therapy.  He does have a home health aide that comes in though they are not typically consistent with when they are able to come.  He continues to follow with neurology and continues on IVIG.  He has a wheelchair at home that he reports is very uncomfortable and it is difficult for him to self power.  He does report he has assistance with transportation now and he  would like to schedule a visit in the next week or so for an in office exam.  Depression: Patient notes sometimes he feels a little depressed based on how things are going for him though typically he is doing quite well.  He takes Zoloft.  No SI.  ROS: See pertinent positives and negatives per HPI.  Past Medical History:  Diagnosis Date   Cataract march, april   bilateral removed    Chickenpox    Diabetes mellitus without complication ([place])    metformin    Diabetic amyotrophy ([place])    sees dr at [facility]   Gastric ulcer    Heart murmur    When he was younger, no recent issues   High blood pressure    controlled with meds   Hypercholesteremia    controlled with medication   Knee injury    left   Motion sickness    boats   Neuropathy     Past Surgical History:  Procedure Laterality Date   CATARACT EXTRACTION W/PHACO Right 02/01/2016   Procedure: CATARACT EXTRACTION PHACO AND INTRAOCULAR LENS PLACEMENT ([place]) right;  Surgeon: Ronnell Freshwater, MD;  Location: [place];  Service: Ophthalmology;  Laterality: Right;  DIABETIC - oral meds   CATARACT EXTRACTION W/PHACO Left 03/07/2016   Procedure: CATARACT EXTRACTION PHACO AND INTRAOCULAR LENS PLACEMENT ([facility]);  Surgeon: Ronnell Freshwater, MD;  Location: [place];  Service: Ophthalmology;  Laterality: Left;  DIABETIC - oral meds    circumscision      Family History  Problem Relation Age of Onset   Alcoholism Father  Hyperlipidemia Father    Hypertension Father    Throat cancer Father        smoked but had stopped 26 yeras before dx   Diabetes Mother    Emphysema Mother    Kidney disease Neg Hx    Prostate cancer Neg Hx    Colon cancer Neg Hx    Colon polyps Neg Hx    Esophageal cancer Neg Hx    Rectal cancer Neg Hx    Stomach cancer Neg Hx    Kidney cancer Neg Hx    Bladder Cancer Neg Hx     SOCIAL HX: Former smoker   Current Outpatient Medications:    amLODipine (NORVASC) 10 MG  tablet, TAKE 1 TABLET BY MOUTH EVERY DAY, Disp: 90 tablet, Rfl: 1   atorvastatin (LIPITOR) 40 MG tablet, TAKE 1 TABLET BY MOUTH EVERY DAY, Disp: 30 tablet, Rfl: 1   clomiPHENE (CLOMID) 50 MG tablet, Take 1/2 tablet daily, Disp: 30 tablet, Rfl: 3   gabapentin (NEURONTIN) 300 MG capsule, Take 1 capsule by mouth 2 (two) times daily. Take 1 cap in AM and 2 caps at bedtime, Disp: , Rfl:    JARDIANCE 10 MG TABS tablet, TAKE 1 TABLET BY MOUTH EVERY DAY BEFORE BREAKFAST, Disp: 30 tablet, Rfl: 2   LINZESS 72 MCG capsule, TAKE 1 CAPSULE (72 MCG TOTAL) BY MOUTH DAILY BEFORE BREAKFAST., Disp: 30 capsule, Rfl: 1   Multiple Vitamin (MULTIVITAMIN) capsule, Take 1 capsule by mouth daily., Disp: , Rfl:    Nutritional Supplements (ENSURE ACTIVE HIGH PROTEIN) LIQD, Take 1 Can by mouth 3 (three) times daily as needed., Disp: 21330 mL, Rfl: 11   Omega-3 Fatty Acids (FISH OIL PO), Take 1 capsule by mouth daily. , Disp: , Rfl:    omeprazole (PRILOSEC) 20 MG capsule, TAKE 1 CAPSULE BY MOUTH EVERY DAY, Disp: 90 capsule, Rfl: 1   sildenafil (VIAGRA) 50 MG tablet, Take 50 mg by mouth as needed for erectile dysfunction. , Disp: , Rfl:    loratadine (CLARITIN) 10 MG tablet, TAKE 1 TABLET EVERY DAY (Patient not taking: Reported on 04/09/2021), Disp: 30 tablet, Rfl: 1   sertraline (ZOLOFT) 50 MG tablet, TAKE 1 TABLET (50 MG TOTAL) BY MOUTH DAILY FOR 14 DAYS, THEN 2 TABLETS (100 MG TOTAL) DAILY., Disp: 180 tablet, Rfl: 1   sodium chloride 0.9 % infusion, Inject into the vein. (Patient not taking: Reported on 04/09/2021), Disp: , Rfl:   EXAM:  VITALS per patient if applicable:  GENERAL: alert, oriented, appears well and in no acute distress  HEENT: atraumatic, conjunttiva clear, no obvious abnormalities on inspection of external nose and ears  NECK: normal movements of the head and neck  LUNGS: on inspection no signs of respiratory distress, breathing rate appears normal, no obvious gross SOB, gasping or wheezing  CV: no  obvious cyanosis  MS: moves upper extremities without noticeable abnormality  PSYCH/NEURO: pleasant and cooperative, no obvious depression or anxiety, speech and thought processing grossly intact  ASSESSMENT AND PLAN:  Discussed the following assessment and plan:  Problem List Items Addressed This Visit     Diabetes ([place])    Seems to be very well controlled.  We will plan on checking an A1c when he is in the office next.  He will continue Jardiance 10 mg once daily.  Discussed the potential for backing off on this.       CIDP (chronic inflammatory demyelinating polyneuropathy) (HCC)    Chronic issue.  He needs follow-up for an  in person physical exam for his electric wheelchair paperwork.  We will get this scheduled.  I encouraged him to contact his neurology team to get scheduled for follow-up.       Depression, major, recurrent, mild ([place])    Generally well controlled.  He will continue Zoloft 100 mg once daily.  He will monitor.        No follow-ups on file.   I discussed the assessment and treatment plan with the patient. The patient was provided an opportunity to ask questions and all were answered. The patient agreed with the plan and demonstrated an understanding of the instructions.   The patient was advised to call back or seek an in-person evaluation if the symptoms worsen or if the condition fails to improve as anticipated.   [name], MD

## 2021-04-11 DIAGNOSIS — M7542 Impingement syndrome of left shoulder: Secondary | ICD-10-CM | POA: Diagnosis not present

## 2021-04-11 DIAGNOSIS — R531 Weakness: Secondary | ICD-10-CM | POA: Diagnosis not present

## 2021-04-11 DIAGNOSIS — G6181 Chronic inflammatory demyelinating polyneuritis: Secondary | ICD-10-CM | POA: Diagnosis not present

## 2021-04-11 DIAGNOSIS — S39012D Strain of muscle, fascia and tendon of lower back, subsequent encounter: Secondary | ICD-10-CM | POA: Diagnosis not present

## 2021-04-13 ENCOUNTER — Ambulatory Visit: Payer: Medicare Other | Admitting: Podiatry

## 2021-04-20 ENCOUNTER — Ambulatory Visit (INDEPENDENT_AMBULATORY_CARE_PROVIDER_SITE_OTHER): Payer: Medicare Other | Admitting: Podiatry

## 2021-04-20 ENCOUNTER — Ambulatory Visit (INDEPENDENT_AMBULATORY_CARE_PROVIDER_SITE_OTHER): Payer: Medicare Other

## 2021-04-20 ENCOUNTER — Ambulatory Visit (INDEPENDENT_AMBULATORY_CARE_PROVIDER_SITE_OTHER): Payer: Medicare Other | Admitting: Family Medicine

## 2021-04-20 ENCOUNTER — Encounter: Payer: Self-pay | Admitting: Podiatry

## 2021-04-20 ENCOUNTER — Other Ambulatory Visit: Payer: Self-pay

## 2021-04-20 VITALS — BP 120/70 | HR 87 | Temp 98.0°F | Wt 159.0 lb

## 2021-04-20 VITALS — BP 120/70 | HR 87 | Temp 98.0°F | Ht 71.0 in | Wt 159.0 lb

## 2021-04-20 DIAGNOSIS — M79609 Pain in unspecified limb: Secondary | ICD-10-CM

## 2021-04-20 DIAGNOSIS — E119 Type 2 diabetes mellitus without complications: Secondary | ICD-10-CM

## 2021-04-20 DIAGNOSIS — E785 Hyperlipidemia, unspecified: Secondary | ICD-10-CM

## 2021-04-20 DIAGNOSIS — G6181 Chronic inflammatory demyelinating polyneuritis: Secondary | ICD-10-CM

## 2021-04-20 DIAGNOSIS — B351 Tinea unguium: Secondary | ICD-10-CM

## 2021-04-20 DIAGNOSIS — R7989 Other specified abnormal findings of blood chemistry: Secondary | ICD-10-CM

## 2021-04-20 LAB — COMPREHENSIVE METABOLIC PANEL
ALT: 70 U/L — ABNORMAL HIGH (ref 0–53)
AST: 52 U/L — ABNORMAL HIGH (ref 0–37)
Albumin: 4.7 g/dL (ref 3.5–5.2)
Alkaline Phosphatase: 147 U/L — ABNORMAL HIGH (ref 39–117)
BUN: 14 mg/dL (ref 6–23)
CO2: 31 mEq/L (ref 19–32)
Calcium: 10.1 mg/dL (ref 8.4–10.5)
Chloride: 99 mEq/L (ref 96–112)
Creatinine, Ser: 0.37 mg/dL — ABNORMAL LOW (ref 0.40–1.50)
GFR: 123.41 mL/min (ref >60.00)
Glucose, Bld: 109 mg/dL — ABNORMAL HIGH (ref 70–99)
Potassium: 4.7 mEq/L (ref 3.5–5.1)
Sodium: 138 mEq/L (ref 135–145)
Total Bilirubin: 0.6 mg/dL (ref 0.2–1.2)
Total Protein: 7.6 g/dL (ref 6.0–8.3)

## 2021-04-20 LAB — LIPID PANEL
Cholesterol: 150 mg/dL (ref 0–200)
HDL: 35.4 mg/dL — ABNORMAL LOW (ref >39.00)
LDL Cholesterol: 92 mg/dL (ref 0–99)
NonHDL: 115.06
Total CHOL/HDL Ratio: 4
Triglycerides: 115 mg/dL (ref 0.0–149.0)
VLDL: 23 mg/dL (ref 0.0–40.0)

## 2021-04-20 LAB — HEMOGLOBIN A1C: Hgb A1c MFr Bld: 5.9 % (ref 4.6–6.5)

## 2021-04-20 NOTE — Patient Instructions (Signed)
Nice to see you. We will work on your paperwork for your motorized wheelchair. We will get lab work today and contact you with the results.

## 2021-04-20 NOTE — Progress Notes (Deleted)
Michael Rumps, MD Phone: [phone]  HAKAN NUDELMAN is a 60 y.o. male who presents today for ***  ***  Social History   Tobacco Use  Smoking Status Former   Pack years: 0.00   Types: Cigars  Smokeless Tobacco Never  Tobacco Comments   one cigar once a month to once every 2 months     Current Outpatient Medications on File Prior to Visit  Medication Sig Dispense Refill   amLODipine (NORVASC) 10 MG tablet TAKE 1 TABLET BY MOUTH EVERY DAY 90 tablet 1   atorvastatin (LIPITOR) 40 MG tablet TAKE 1 TABLET BY MOUTH EVERY DAY 30 tablet 1   clomiPHENE (CLOMID) 50 MG tablet Take 1/2 tablet daily 30 tablet 3   gabapentin (NEURONTIN) 300 MG capsule Take 1 capsule by mouth 2 (two) times daily. Take 1 cap in AM and 2 caps at bedtime     JARDIANCE 10 MG TABS tablet TAKE 1 TABLET BY MOUTH EVERY DAY BEFORE BREAKFAST 30 tablet 2   LINZESS 72 MCG capsule TAKE 1 CAPSULE (72 MCG TOTAL) BY MOUTH DAILY BEFORE BREAKFAST. 30 capsule 1   loratadine (CLARITIN) 10 MG tablet TAKE 1 TABLET EVERY DAY 30 tablet 1   Multiple Vitamin (MULTIVITAMIN) capsule Take 1 capsule by mouth daily.     Nutritional Supplements (ENSURE ACTIVE HIGH PROTEIN) LIQD Take 1 Can by mouth 3 (three) times daily as needed. 21330 mL 11   Omega-3 Fatty Acids (FISH OIL PO) Take 1 capsule by mouth daily.      omeprazole (PRILOSEC) 20 MG capsule TAKE 1 CAPSULE BY MOUTH EVERY DAY 90 capsule 1   sildenafil (VIAGRA) 50 MG tablet Take 50 mg by mouth as needed for erectile dysfunction.      sodium chloride 0.9 % infusion Inject into the vein.     sertraline (ZOLOFT) 50 MG tablet TAKE 1 TABLET (50 MG TOTAL) BY MOUTH DAILY FOR 14 DAYS, THEN 2 TABLETS (100 MG TOTAL) DAILY. 180 tablet 1   No current facility-administered medications on file prior to visit.     ROS see history of present illness  Objective  Physical Exam Vitals:   04/20/21 1343  BP: 120/70  Pulse: 87  Temp: 98 F (36.7 C)  SpO2: 99%    BP Readings from Last 3  Encounters:  04/20/21 120/70  10/15/19 125/69  08/27/19 (!) 147/82   Wt Readings from Last 3 Encounters:  04/20/21 159 lb (72.1 kg)  04/09/21 160 lb (72.6 kg)  09/16/20 158 lb (71.7 kg)    Physical Exam Constitutional:      General: He is not in acute distress.    Appearance: He is not diaphoretic.  Cardiovascular:     Rate and Rhythm: Normal rate and regular rhythm.     Heart sounds: Normal heart sounds.  Pulmonary:     Effort: Pulmonary effort is normal.     Breath sounds: Normal breath sounds.  Skin:    General: Skin is warm and dry.  Neurological:     Mental Status: He is alert.     Comments: 5/5 strength in bilateral biceps, triceps, grip,  2/5 quads, hamstrings, plantar and dorsiflexion, sensation to light touch intact in bilateral UE and LE, normal gait, 2+ patellar reflexes     Assessment/Plan: Please see individual problem list.  Problem List Items Addressed This Visit     CIDP (chronic inflammatory demyelinating polyneuropathy) ([place]) - Primary   Diabetes ([place])   Relevant Orders   HgB A1c  Hyperlipidemia   Relevant Orders   Comp Met (CMET)   Lipid panel     Health Maintenance: ***  Return in about 3 months (around 07/21/2021).  This visit occurred during the SARS-CoV-2 public health emergency.  Safety protocols were in place, including screening questions prior to the visit, additional usage of staff PPE, and extensive cleaning of exam room while observing appropriate contact time as indicated for disinfecting solutions.    Michael Rumps, MD [place]

## 2021-04-20 NOTE — Progress Notes (Signed)
Complaint:  Visit Type: Patient returns to my office for continued preventative foot care services. Complaint: Patient states" my nails have grown long and thick and become painful to walk and wear shoes" Patient has been diagnosed with DM with no foot complications. The patient presents for preventative foot care services. No changes to ROS  Podiatric Exam: Vascular: dorsalis pedis and posterior tibial pulses are palpable bilateral. Capillary return is immediate. Temperature gradient is WNL. Skin turgor WNL  Sensorium: Normal Semmes Weinstein monofilament test. Normal tactile sensation bilaterally. Nail Exam: Pt has thick disfigured discolored nails with subungual debris noted bilateral entire nail hallux through fifth toenails Ulcer Exam: There is no evidence of ulcer or pre-ulcerative changes or infection. Orthopedic Exam: Muscle tone and strength are WNL. No limitations in general ROM. No crepitus or effusions noted. Hallux limitus  Right.  Hammer toes 2-4  B/L Skin: No Porokeratosis. No infection or ulcers  Diagnosis:  Onychomycosis, , Pain in right toe, pain in left toes  Treatment & Plan Procedures and Treatment: Consent by patient was obtained for treatment procedures. The patient understood the discussion of treatment and procedures well. All questions were answered thoroughly reviewed. Debridement of mycotic and hypertrophic toenails, 1 through 5 bilateral and clearing of subungual debris. No ulceration, no infection noted.  Return Visit-Office Procedure: Patient instructed to return to the office for a follow up visit 4 months for continued evaluation and treatment.    Gardiner Barefoot DPM

## 2021-04-21 NOTE — Progress Notes (Signed)
This visit was canceled in error and a new encounter was created.  Please see the other encounter for this date of service.

## 2021-04-21 NOTE — Assessment & Plan Note (Addendum)
The patient certainly would benefit from a motorized wheelchair and given his apparent spasticity and difficulty moving his legs as well as difficulty shifting his own weight with his arms would benefit from a chair that elevates and props him up.  He would benefit from power tilt option with his wheelchair.  Power recline would also be beneficial to help manage spasticity in his legs.  Power recline may help him get his legs into a better position to limit risk for increasing spasticity.  Such a wheelchair would allow him to participate in his ADL activities more effectively.

## 2021-04-21 NOTE — Progress Notes (Addendum)
[name], MD Phone: [phone]  Michael Montgomery is a 60 y.o. male who presents today for f/u.  CIDP: Patient presents for follow-up on this with physical exam for wheelchair evaluation.  Patient reports he is unable to walk or self propel in his manual wheelchair.  He has been working with [employer] on getting a wheelchair though had not been seen in the office in a couple of years given the [place] pandemic and his weakness/transportation issues.  He notes he is able to move his legs some though he is unable to walk.  He can stand for 1 to 2 minutes and feels as though an electric wheelchair that elevates will help him transfer much easier.  He is able to lift his arms though cannot self propel his wheelchair.  He feels as though his symptoms have been stable.  He notes no pressure sores though does note his current wheelchair is quite uncomfortable to sit in.  He tries to shift his weight as much as he is able to on his own though has trouble doing this given weakness in his arms.  He notes there is some assistance at home to help with this.  He does continue to follow with neurology and plans to get an in person visit scheduled with them in the near future.  He continues on IVIG.  Social History   Tobacco Use  Smoking Status Former   Pack years: 0.00   Types: Cigars  Smokeless Tobacco Never  Tobacco Comments   one cigar once a month to once every 2 months     Current Outpatient Medications on File Prior to Visit  Medication Sig Dispense Refill   amLODipine (NORVASC) 10 MG tablet TAKE 1 TABLET BY MOUTH EVERY DAY 90 tablet 1   atorvastatin (LIPITOR) 40 MG tablet TAKE 1 TABLET BY MOUTH EVERY DAY 30 tablet 1   clomiPHENE (CLOMID) 50 MG tablet Take 1/2 tablet daily 30 tablet 3   gabapentin (NEURONTIN) 300 MG capsule Take 1 capsule by mouth 2 (two) times daily. Take 1 cap in AM and 2 caps at bedtime     JARDIANCE 10 MG TABS tablet TAKE 1 TABLET BY MOUTH EVERY DAY BEFORE BREAKFAST 30  tablet 2   LINZESS 72 MCG capsule TAKE 1 CAPSULE (72 MCG TOTAL) BY MOUTH DAILY BEFORE BREAKFAST. 30 capsule 1   loratadine (CLARITIN) 10 MG tablet TAKE 1 TABLET EVERY DAY 30 tablet 1   Multiple Vitamin (MULTIVITAMIN) capsule Take 1 capsule by mouth daily.     Nutritional Supplements (ENSURE ACTIVE HIGH PROTEIN) LIQD Take 1 Can by mouth 3 (three) times daily as needed. 21330 mL 11   Omega-3 Fatty Acids (FISH OIL PO) Take 1 capsule by mouth daily.      omeprazole (PRILOSEC) 20 MG capsule TAKE 1 CAPSULE BY MOUTH EVERY DAY 90 capsule 1   sertraline (ZOLOFT) 50 MG tablet TAKE 1 TABLET (50 MG TOTAL) BY MOUTH DAILY FOR 14 DAYS, THEN 2 TABLETS (100 MG TOTAL) DAILY. 180 tablet 1   sildenafil (VIAGRA) 50 MG tablet Take 50 mg by mouth as needed for erectile dysfunction.      sodium chloride 0.9 % infusion Inject into the vein.     No current facility-administered medications on file prior to visit.     ROS see history of present illness  Objective  Physical Exam Vitals:   04/21/21 1044  BP: 120/70  Pulse: 87  Temp: 98 F (36.7 C)  SpO2: 99%  BP Readings from Last 3 Encounters:  04/21/21 120/70  04/20/21 120/70  10/15/19 125/69   Wt Readings from Last 3 Encounters:  04/21/21 159 lb (72.1 kg)  04/20/21 159 lb (72.1 kg)  04/09/21 160 lb (72.6 kg)    Physical Exam Constitutional:      General: He is not in acute distress.    Appearance: He is not diaphoretic.  Cardiovascular:     Rate and Rhythm: Normal rate and regular rhythm.     Heart sounds: Normal heart sounds.  Pulmonary:     Effort: Pulmonary effort is normal.     Breath sounds: Normal breath sounds.  Skin:    General: Skin is warm and dry.  Neurological:     Mental Status: He is alert.     Comments: 5/5 strength in bilateral biceps, triceps, grip, the patient is able to move his quads, hamstrings, plantar flexion, and dorsiflexion slightly against gravity, he does appear to have weakness against resistance, sensation  to light touch intact in bilateral UE and LE, normal gait, 2+ patellar reflexes, appears to have spasticity in his calves and hamstrings that has decreased his range of motion at his knees and ankles,  hamstring and calves with 1 score on modified ashworth scoring, he has muscle atrophy notable throughout his legs     Assessment/Plan: Please see individual problem list.  Problem List Items Addressed This Visit     CIDP (chronic inflammatory demyelinating polyneuropathy) ([place])    The patient certainly would benefit from a motorized wheelchair and given his apparent contracture and difficulty moving his legs as well as difficulty shifting his own weight with his arms would benefit from a chair that elevates and props him up.  Such a wheelchair would allow him to participate in his ADL activities more effectively.  We were unable to find the letter/form that his wheelchair company needed and thus are requesting this again.  Once we have received this we can submit this documentation to hopefully get him this motorized wheelchair.       Diabetes ([place]) - Primary   Relevant Orders   HgB A1c (Completed)   Comp Met (CMET) (Completed)   Elevated LFTs   Relevant Orders   Lipid panel (Completed)     Return in about 3 months (around 07/21/2021).  This visit occurred during the SARS-CoV-2 public health emergency.  Safety protocols were in place, including screening questions prior to the visit, additional usage of staff PPE, and extensive cleaning of exam room while observing appropriate contact time as indicated for disinfecting solutions.    [name], MD [place]

## 2021-04-22 ENCOUNTER — Telehealth: Payer: Self-pay

## 2021-04-23 NOTE — Telephone Encounter (Signed)
Noted. Please fax my note from 04/20/21 to [employer] and hopefully this will be enough to get him the wheel chair.

## 2021-04-23 NOTE — Telephone Encounter (Signed)
Paperwork faxed °

## 2021-04-27 DIAGNOSIS — G6181 Chronic inflammatory demyelinating polyneuritis: Secondary | ICD-10-CM | POA: Diagnosis not present

## 2021-04-28 DIAGNOSIS — G6181 Chronic inflammatory demyelinating polyneuritis: Secondary | ICD-10-CM | POA: Diagnosis not present

## 2021-04-29 DIAGNOSIS — G6181 Chronic inflammatory demyelinating polyneuritis: Secondary | ICD-10-CM | POA: Diagnosis not present

## 2021-04-30 DIAGNOSIS — G6181 Chronic inflammatory demyelinating polyneuritis: Secondary | ICD-10-CM | POA: Diagnosis not present

## 2021-05-01 ENCOUNTER — Other Ambulatory Visit: Payer: Self-pay | Admitting: Family Medicine

## 2021-05-11 DIAGNOSIS — M7542 Impingement syndrome of left shoulder: Secondary | ICD-10-CM | POA: Diagnosis not present

## 2021-05-11 DIAGNOSIS — S39012D Strain of muscle, fascia and tendon of lower back, subsequent encounter: Secondary | ICD-10-CM | POA: Diagnosis not present

## 2021-05-11 DIAGNOSIS — G6181 Chronic inflammatory demyelinating polyneuritis: Secondary | ICD-10-CM | POA: Diagnosis not present

## 2021-05-11 DIAGNOSIS — R531 Weakness: Secondary | ICD-10-CM | POA: Diagnosis not present

## 2021-05-13 ENCOUNTER — Other Ambulatory Visit: Payer: Self-pay | Admitting: Family Medicine

## 2021-05-13 DIAGNOSIS — R7989 Other specified abnormal findings of blood chemistry: Secondary | ICD-10-CM

## 2021-05-16 ENCOUNTER — Other Ambulatory Visit: Payer: Self-pay | Admitting: Family Medicine

## 2021-05-18 ENCOUNTER — Telehealth: Payer: Self-pay

## 2021-05-25 NOTE — Telephone Encounter (Signed)
I signed off on the form.  This needs to have a diagnosis list and medication list included with that and needs to be faxed to the number on the second page.  Please fill in our office information as well.

## 2021-05-26 NOTE — Telephone Encounter (Signed)
Form, diagnosis and medication list was faxed to [facility] and a confirmation was given.  Renee Erb,cma

## 2021-06-01 DIAGNOSIS — G6181 Chronic inflammatory demyelinating polyneuritis: Secondary | ICD-10-CM | POA: Diagnosis not present

## 2021-06-02 ENCOUNTER — Other Ambulatory Visit: Payer: Self-pay

## 2021-06-02 MED ORDER — AMLODIPINE BESYLATE 10 MG PO TABS: 10.0000 mg | ORAL_TABLET | Freq: Every day | ORAL | 1 refills | Status: DC

## 2021-06-04 ENCOUNTER — Other Ambulatory Visit: Payer: Self-pay | Admitting: Family Medicine

## 2021-06-06 ENCOUNTER — Other Ambulatory Visit: Payer: Self-pay | Admitting: Internal Medicine

## 2021-06-11 DIAGNOSIS — M7542 Impingement syndrome of left shoulder: Secondary | ICD-10-CM | POA: Diagnosis not present

## 2021-06-11 DIAGNOSIS — R531 Weakness: Secondary | ICD-10-CM | POA: Diagnosis not present

## 2021-06-11 DIAGNOSIS — G6181 Chronic inflammatory demyelinating polyneuritis: Secondary | ICD-10-CM | POA: Diagnosis not present

## 2021-06-11 DIAGNOSIS — S39012D Strain of muscle, fascia and tendon of lower back, subsequent encounter: Secondary | ICD-10-CM | POA: Diagnosis not present

## 2021-06-17 ENCOUNTER — Telehealth: Payer: Self-pay | Admitting: Family Medicine

## 2021-06-17 NOTE — Telephone Encounter (Signed)
KB Hansley from [employer] came to drop off a form for the patient to be faxed to their office.It is upfront in Dr.Sonnenberg's color folder.

## 2021-06-21 DIAGNOSIS — G6181 Chronic inflammatory demyelinating polyneuritis: Secondary | ICD-10-CM | POA: Diagnosis not present

## 2021-06-22 DIAGNOSIS — G6181 Chronic inflammatory demyelinating polyneuritis: Secondary | ICD-10-CM | POA: Diagnosis not present

## 2021-06-23 DIAGNOSIS — G6181 Chronic inflammatory demyelinating polyneuritis: Secondary | ICD-10-CM | POA: Diagnosis not present

## 2021-06-24 DIAGNOSIS — G6181 Chronic inflammatory demyelinating polyneuritis: Secondary | ICD-10-CM | POA: Diagnosis not present

## 2021-06-24 NOTE — Telephone Encounter (Signed)
Signed. Please fax.

## 2021-06-25 NOTE — Telephone Encounter (Signed)
Paperwork for [employer] wheelchair was faxed to them and confirmation given.  Lenix Kidd,cma

## 2021-06-25 NOTE — Telephone Encounter (Signed)
KB Hansley from [employer] calling in to check if form has been faxed.   Please advise

## 2021-07-04 ENCOUNTER — Other Ambulatory Visit: Payer: Self-pay | Admitting: Family Medicine

## 2021-07-05 NOTE — Telephone Encounter (Signed)
Called to speak with [facility]. The pharmacist confirmed Dr.Sonnenbergs orders for Zoloft of take Zoloft 100 mg (1 tablet) once daily.

## 2021-07-05 NOTE — Telephone Encounter (Signed)
I think I sent the wrong instructions in on this patients zoloft. They should read that he should take zoloft 100 mg (1 tablet) once daily. Can you relay this to the pharmacy? Thanks.

## 2021-07-07 ENCOUNTER — Other Ambulatory Visit: Payer: Self-pay | Admitting: Internal Medicine

## 2021-07-07 ENCOUNTER — Other Ambulatory Visit: Payer: Self-pay | Admitting: Family Medicine

## 2021-07-12 DIAGNOSIS — M7542 Impingement syndrome of left shoulder: Secondary | ICD-10-CM | POA: Diagnosis not present

## 2021-07-12 DIAGNOSIS — S39012D Strain of muscle, fascia and tendon of lower back, subsequent encounter: Secondary | ICD-10-CM | POA: Diagnosis not present

## 2021-07-12 DIAGNOSIS — G6181 Chronic inflammatory demyelinating polyneuritis: Secondary | ICD-10-CM | POA: Diagnosis not present

## 2021-07-12 DIAGNOSIS — R531 Weakness: Secondary | ICD-10-CM | POA: Diagnosis not present

## 2021-07-14 ENCOUNTER — Other Ambulatory Visit: Payer: Self-pay

## 2021-07-14 ENCOUNTER — Ambulatory Visit (INDEPENDENT_AMBULATORY_CARE_PROVIDER_SITE_OTHER): Payer: Medicare Other

## 2021-07-14 VITALS — Ht 71.0 in | Wt 159.0 lb

## 2021-07-14 DIAGNOSIS — Z0000 Encounter for general adult medical examination without abnormal findings: Secondary | ICD-10-CM

## 2021-07-14 DIAGNOSIS — Z1211 Encounter for screening for malignant neoplasm of colon: Secondary | ICD-10-CM | POA: Diagnosis not present

## 2021-07-14 DIAGNOSIS — Z86010 Personal history of colonic polyps: Secondary | ICD-10-CM

## 2021-07-14 MED ORDER — NA SULFATE-K SULFATE-MG SULF 17.5-3.13-1.6 GM/177ML PO SOLN: 1.0000 | Freq: Once | ORAL | 0 refills | Status: AC

## 2021-07-14 NOTE — Patient Instructions (Addendum)
  Michael Montgomery , Thank you for taking time to come for your Medicare Wellness Visit. I appreciate your ongoing commitment to your health goals. Please review the following plan we discussed and let me know if I can assist you in the future.   These are the goals we discussed:  Goals       Patient Stated     Prevent falls (pt-stated)        This is a list of the screening recommended for you and due dates:  Health Maintenance  Topic Date Due   Urine Protein Check  07/25/2020   Colon Cancer Screening  03/31/2021   Eye exam for diabetics  07/14/2021*   COVID-19 Vaccine (1) 07/30/2021*   Zoster (Shingles) Vaccine (1 of 2) 10/13/2021*   Flu Shot  01/21/2022*   Hemoglobin A1C  10/20/2021   Complete foot exam   04/07/2022   Tetanus Vaccine  06/08/2029   Hepatitis C Screening: USPSTF Recommendation to screen - Ages 18-79 yo.  Completed   HIV Screening  Completed   HPV Vaccine  Aged Out  *Topic was postponed. The date shown is not the original due date.

## 2021-07-14 NOTE — Progress Notes (Signed)
Gastroenterology Pre-Procedure Review  Request Date: 08/04/21 Requesting Physician: Dr. Vicente Males  PATIENT REVIEW QUESTIONS: The patient responded to the following health history questions as indicated:    1. Are you having any GI issues? no 2. Do you have a personal history of Polyps? yes (03/31/2016) 3. Do you have a family history of Colon Cancer or Polyps? no 4. Diabetes Mellitus? no 5. Joint replacements in the past 12 months?no 6. Major health problems in the past 3 months?no 7. Any artificial heart valves, MVP, or defibrillator?no    MEDICATIONS & ALLERGIES:    Patient reports the following regarding taking any anticoagulation/antiplatelet therapy:   Plavix, Coumadin, Eliquis, Xarelto, Lovenox, Pradaxa, Brilinta, or Effient? no Aspirin? yes (81 mg)  Patient confirms/reports the following medications:  Current Outpatient Medications  Medication Sig Dispense Refill   amLODipine (NORVASC) 10 MG tablet TAKE 1 TABLET BY MOUTH EVERY DAY 90 tablet 1   atorvastatin (LIPITOR) 40 MG tablet TAKE 1 TABLET BY MOUTH EVERY DAY 90 tablet 1   clomiPHENE (CLOMID) 50 MG tablet Take 1/2 tablet daily 30 tablet 3   gabapentin (NEURONTIN) 300 MG capsule Take 1 capsule by mouth 2 (two) times daily. Take 1 cap in AM and 2 caps at bedtime     JARDIANCE 10 MG TABS tablet TAKE 1 TABLET BY MOUTH EVERY DAY BEFORE BREAKFAST 30 tablet 2   LINZESS 72 MCG capsule TAKE 1 CAPSULE BY MOUTH DAILY BEFORE BREAKFAST. 30 capsule 1   loratadine (CLARITIN) 10 MG tablet TAKE 1 TABLET EVERY DAY 30 tablet 1   Multiple Vitamin (MULTIVITAMIN) capsule Take 1 capsule by mouth daily.     Nutritional Supplements (ENSURE ACTIVE HIGH PROTEIN) LIQD Take 1 Can by mouth 3 (three) times daily as needed. 21330 mL 11   Omega-3 Fatty Acids (FISH OIL PO) Take 1 capsule by mouth daily.      omeprazole (PRILOSEC) 20 MG capsule TAKE 1 CAPSULE BY MOUTH EVERY DAY 90 capsule 1   sertraline (ZOLOFT) 100 MG tablet Take 1 tablet (100 mg total) by  mouth daily. 90 tablet 3   sildenafil (VIAGRA) 50 MG tablet Take 50 mg by mouth as needed for erectile dysfunction.      sodium chloride 0.9 % infusion Inject into the vein.     No current facility-administered medications for this visit.    Patient confirms/reports the following allergies:  No Known Allergies  No orders of the defined types were placed in this encounter.   AUTHORIZATION INFORMATION Primary Insurance: 1D#: Group #:  Secondary Insurance: 1D#: Group #:  SCHEDULE INFORMATION: Date: 08/04/21 Time: Location: [facility]

## 2021-07-14 NOTE — Progress Notes (Signed)
Subjective:   Michael Montgomery is a 60 y.o. male who presents for Medicare Annual/Subsequent preventive examination.  Review of Systems    No ROS.  Medicare Wellness Virtual Visit.  Visual/audio telehealth visit, UTA vital signs.   See social history for additional risk factors.   Cardiac Risk Factors include: advanced age (>17men, >44 women);male gender;diabetes mellitus;hypertension     Objective:    Today's Vitals   07/14/21 1119  Weight: 159 lb (72.1 kg)  Height: 5\' 11"  (1.803 m)   Body mass index is 22.18 kg/m.  Advanced Directives 07/14/2021 08/05/2020 07/29/2020 07/13/2020 10/10/2019 08/26/2019 07/11/2019  Does Patient Have a Medical Advance Directive? Yes No No No No No No  Type of Paramedic of [place];Living will - - - - - -  Does patient want to make changes to medical advance directive? No - Patient declined - - - - - -  Copy of [place] in Chart? No - copy requested - - - - - -  Would patient like information on creating a medical advance directive? - No - Patient declined No - Patient declined Yes (MAU/Ambulatory/Procedural Areas - Information given) No - Patient declined No - Guardian declined Yes (MAU/Ambulatory/Procedural Areas - Information given)  Some encounter information is confidential and restricted. Go to Review Flowsheets activity to see all data.    Current Medications (verified) Outpatient Encounter Medications as of 07/14/2021  Medication Sig   amLODipine (NORVASC) 10 MG tablet TAKE 1 TABLET BY MOUTH EVERY DAY   atorvastatin (LIPITOR) 40 MG tablet TAKE 1 TABLET BY MOUTH EVERY DAY   clomiPHENE (CLOMID) 50 MG tablet Take 1/2 tablet daily   gabapentin (NEURONTIN) 300 MG capsule Take 1 capsule by mouth 2 (two) times daily. Take 1 cap in AM and 2 caps at bedtime   JARDIANCE 10 MG TABS tablet TAKE 1 TABLET BY MOUTH EVERY DAY BEFORE BREAKFAST   LINZESS 72 MCG capsule TAKE 1 CAPSULE BY MOUTH DAILY BEFORE BREAKFAST.    loratadine (CLARITIN) 10 MG tablet TAKE 1 TABLET EVERY DAY   Multiple Vitamin (MULTIVITAMIN) capsule Take 1 capsule by mouth daily.   Nutritional Supplements (ENSURE ACTIVE HIGH PROTEIN) LIQD Take 1 Can by mouth 3 (three) times daily as needed.   Omega-3 Fatty Acids (FISH OIL PO) Take 1 capsule by mouth daily.    omeprazole (PRILOSEC) 20 MG capsule TAKE 1 CAPSULE BY MOUTH EVERY DAY   sertraline (ZOLOFT) 100 MG tablet Take 1 tablet (100 mg total) by mouth daily.   sildenafil (VIAGRA) 50 MG tablet Take 50 mg by mouth as needed for erectile dysfunction.    sodium chloride 0.9 % infusion Inject into the vein.   No facility-administered encounter medications on file as of 07/14/2021.    Allergies (verified) Patient has no known allergies.   History: Past Medical History:  Diagnosis Date   Cataract march, april   bilateral removed    Chickenpox    Diabetes mellitus without complication ([place])    metformin    Diabetic amyotrophy ([place])    sees dr at [facility]   Gastric ulcer    Heart murmur    When he was younger, no recent issues   High blood pressure    controlled with meds   Hypercholesteremia    controlled with medication   Knee injury    left   Motion sickness    boats   Neuropathy    Past Surgical History:  Procedure Laterality Date  CATARACT EXTRACTION W/PHACO Right 02/01/2016   Procedure: CATARACT EXTRACTION PHACO AND INTRAOCULAR LENS PLACEMENT ([place]) right;  Surgeon: Ronnell Freshwater, MD;  Location: [facility];  Service: Ophthalmology;  Laterality: Right;  DIABETIC - oral meds   CATARACT EXTRACTION W/PHACO Left 03/07/2016   Procedure: CATARACT EXTRACTION PHACO AND INTRAOCULAR LENS PLACEMENT ([facility]);  Surgeon: Ronnell Freshwater, MD;  Location: [facility];  Service: Ophthalmology;  Laterality: Left;  DIABETIC - oral meds    circumscision     Family History  Problem Relation Age of Onset   Alcoholism Father    Hyperlipidemia Father     Hypertension Father    Throat cancer Father        smoked but had stopped 40 yeras before dx   Diabetes Mother    Emphysema Mother    Kidney disease Neg Hx    Prostate cancer Neg Hx    Colon cancer Neg Hx    Colon polyps Neg Hx    Esophageal cancer Neg Hx    Rectal cancer Neg Hx    Stomach cancer Neg Hx    Kidney cancer Neg Hx    Bladder Cancer Neg Hx    Social History   Socioeconomic History   Marital status: Married    Spouse name: Joan Avetisyan   Number of children: 2   Years of education: 12   Highest education level: 12th grade  Occupational History   Occupation: Disabled  Tobacco Use   Smoking status: Former    Types: Cigars   Smokeless tobacco: Never   Tobacco comments:    one cigar once a month to once every 2 months   Vaping Use   Vaping Use: Never used  Substance and Sexual Activity   Alcohol use: Yes    Alcohol/week: 5.0 standard drinks    Types: 5 Shots of liquor per week    Comment: pt stated " every once in a while"   Drug use: No   Sexual activity: Not Currently  Other Topics Concern   Not on file  Social History Narrative   Not on file   Social Determinants of Health   Financial Resource Strain: Low Risk    Difficulty of Paying Living Expenses: Not hard at all  Food Insecurity: No Food Insecurity   Worried About Charity fundraiser in the Last Year: Never true   [place] in the Last Year: Never true  Transportation Needs: No Transportation Needs   Lack of Transportation (Medical): No   Lack of Transportation (Non-Medical): No  Physical Activity: Inactive   Days of Exercise per Week: 0 days   Minutes of Exercise per Session: 0 min  Stress: No Stress Concern Present   Feeling of Stress : Only a little  Social Connections: Moderately Integrated   Frequency of Communication with Friends and Family: More than three times a week   Frequency of Social Gatherings with Friends and Family: More than three times a week   Attends  Religious Services: More than 4 times per year   Active Member of [employer] or Organizations: No   Attends Archivist Meetings: Never   Marital Status: Married    Tobacco Counseling Counseling given: Not Answered Tobacco comments: one cigar once a month to once every 2 months    Clinical Intake:  Pre-visit preparation completed: Yes           How often do you need to have someone help you when you  read instructions, pamphlets, or other written materials from your doctor or pharmacy?: 1 - Never    Interpreter Needed?: No      Activities of Daily Living In your present state of health, do you have any difficulty performing the following activities: 07/14/2021 08/05/2020  Hearing? N N  Vision? N Y  Comment - Cataracts  Difficulty concentrating or making decisions? N N  Walking or climbing stairs? Y Y  Comment Wheelchair bound CIDP (chronic inflammatory demyelinating polyneuropathy)  Dressing or bathing? N Y  Comment - CIDP (chronic inflammatory demyelinating polyneuropathy)  Doing errands, shopping? Y Y  Comment - CIDP (chronic inflammatory demyelinating polyneuropathy)  Preparing Food and eating ? Y Y  Comment Aide assist meal prep. Self feeds CIDP (chronic inflammatory demyelinating polyneuropathy)  Using the Toilet? N Y  Comment - CIDP (chronic inflammatory demyelinating polyneuropathy)  In the past six months, have you accidently leaked urine? Y N  Comment Managed with depend brief as needed -  Do you have problems with loss of bowel control? N N  Managing your Medications? N N  Managing your Finances? N N  Housekeeping or managing your Housekeeping? Y Y  Comment Aide assist CIDP (chronic inflammatory demyelinating polyneuropathy)  Some recent data might be hidden    Patient Care Team: Leone Haven, MD as PCP - General (Family Medicine)  Indicate any recent Medical Services you may have received from other than [facility] providers in the past year  (date may be approximate).     Assessment:   This is a routine wellness examination for Xadrian.  I connected with [place] today by telephone and verified that I am speaking with the correct person using two identifiers. Location patient: home Location provider: work Persons participating in the virtual visit: patient, Marine scientist.    I discussed the limitations, risks, security and privacy concerns of performing an evaluation and management service by telephone and the availability of in person appointments. The patient expressed understanding and verbally consented to this telephonic visit.    Interactive audio and video telecommunications were attempted between this provider and patient, however failed, due to patient having technical difficulties OR patient did not have access to video capability.  We continued and completed visit with audio only.  Some vital signs may be absent or patient reported.   Hearing/Vision screen Hearing Screening - Comments:: Patient is able to hear conversational tones without difficulty.  No issues reported. Vision Screening - Comments:: Followed by [facility] corrective lenses  They have seen their ophthalmologist in the last 12 months Eye exam scheduled 07/26/21 per patient  Dietary issues and exercise activities discussed: Current Exercise Habits: Home exercise routine (Post physical therapy exercises), Intensity: Mild Healthy diet   Goals Addressed               This Visit's Progress     Patient Stated     Prevent falls (pt-stated)   On track      Depression Screen PHQ 2/9 Scores 07/14/2021 04/20/2021 09/11/2020 08/05/2020 07/29/2020 07/13/2020 02/10/2020  PHQ - 2 Score 0 0 0 1 1 2  0  PHQ- 9 Score - - 10 - - - -    Fall Risk Fall Risk  07/14/2021 04/20/2021 09/11/2020 08/05/2020 07/13/2020  Falls in the past year? 0 0 0 0 0  Number falls in past yr: 0 0 0 0 0  Comment - - - - -  Injury with Fall? 0 - 0 0 -  Comment - - - - -  Risk for  fall due to : - - - Impaired balance/gait;Impaired mobility -  Risk for fall due to: Comment - - - - -  Follow up Falls evaluation completed Falls evaluation completed Falls evaluation completed Education provided;Falls prevention discussed Falls evaluation completed    FALL RISK PREVENTION PERTAINING TO THE HOME: Adequate lighting in your home to reduce risk of falls? Yes   ASSISTIVE DEVICES UTILIZED TO PREVENT FALLS: Use of a cane, walker or w/c? Yes   TIMED UP AND GO: Was the test performed? No .   Cognitive Function:  Patient is alert and oriented x3.  Reports he is currently in school with no issues of memory loss.  MMSE/6CIT deferred per patient preference.    6CIT Screen 07/11/2019  What Year? 0 points  What month? 0 points  What time? 0 points  Count back from 20 0 points  Months in reverse 0 points  Repeat phrase 0 points  Total Score 0    Immunizations Immunization History  Administered Date(s) Administered   Influenza,inj,Quad PF,6+ Mos 06/17/2017, 06/21/2018, 06/09/2019   Influenza-Unspecified 08/24/2016, 06/21/2018, 07/12/2018   Pneumococcal Polysaccharide-23 12/08/2016   Tdap 12/08/2016, 06/09/2019   Influenza vaccine- plans to complete later in the season.   Health Maintenance Health Maintenance  Topic Date Due   URINE MICROALBUMIN  07/25/2020   COLONOSCOPY (Pts 45-19yrs Insurance coverage will need to be confirmed)  03/31/2021   OPHTHALMOLOGY EXAM  07/14/2021 (Originally 05/30/2020)   COVID-19 Vaccine (1) 07/30/2021 (Originally 08/15/1961)   Zoster Vaccines- Shingrix (1 of 2) 10/13/2021 (Originally 02/12/1982)   INFLUENZA VACCINE  01/21/2022 (Originally 05/24/2021)   HEMOGLOBIN A1C  10/20/2021   FOOT EXAM  04/07/2022   TETANUS/TDAP  06/08/2029   Hepatitis C Screening  Completed   HIV Screening  Completed   HPV VACCINES  Aged Out   Colorectal cancer screening: Type of screening: Colonoscopy. Completed 2017. Repeat every 5 years. Ordered per consent.    Vision Screening: Recommended annual ophthalmology exams for early detection of glaucoma and other disorders of the eye. Eye exam- scheduled next week. [facility].   Dental Screening: Recommended annual dental exams for proper oral hygiene  Community Resource Referral / Chronic Care Management: CRR required this visit?  No   CCM required this visit?  No      Plan:   Keep all routine maintenance appointments.   I have personally reviewed and noted the following in the patient's chart:   Medical and social history Use of alcohol, tobacco or illicit drugs  Current medications and supplements including opioid prescriptions. Patient is not currently taking opioid prescriptions. Functional ability and status Nutritional status Physical activity Advanced directives List of other physicians Hospitalizations, surgeries, and ER visits in previous 12 months Vitals Screenings to include cognitive, depression, and falls Referrals and appointments  In addition, I have reviewed and discussed with patient certain preventive protocols, quality metrics, and best practice recommendations. A written personalized care plan for preventive services as well as general preventive health recommendations were provided to patient.     Varney Biles, LPN   06/19/785

## 2021-07-15 DIAGNOSIS — G6181 Chronic inflammatory demyelinating polyneuritis: Secondary | ICD-10-CM | POA: Diagnosis not present

## 2021-07-22 ENCOUNTER — Ambulatory Visit: Payer: Medicare Other | Admitting: Podiatry

## 2021-07-22 ENCOUNTER — Telehealth: Payer: Self-pay

## 2021-07-22 NOTE — Telephone Encounter (Signed)
Pt. Calling to cancel upcoming colonoscopy

## 2021-07-22 NOTE — Telephone Encounter (Signed)
Returned patients call he wanted to just cancel procedure for now and will call us back when he is ready to reschedule

## 2021-08-03 ENCOUNTER — Ambulatory Visit: Payer: Medicare Other | Admitting: Gastroenterology

## 2021-08-04 ENCOUNTER — Ambulatory Visit: Payer: Medicare Other | Admitting: Urology

## 2021-08-04 ENCOUNTER — Encounter: Admission: RE | Payer: Self-pay | Source: Home / Self Care

## 2021-08-04 ENCOUNTER — Ambulatory Visit: Admission: RE | Admit: 2021-08-04 | Payer: Medicare Other | Source: Home / Self Care | Admitting: Gastroenterology

## 2021-08-04 SURGERY — COLONOSCOPY WITH PROPOFOL
Anesthesia: General

## 2021-08-11 DIAGNOSIS — R531 Weakness: Secondary | ICD-10-CM | POA: Diagnosis not present

## 2021-08-11 DIAGNOSIS — S39012D Strain of muscle, fascia and tendon of lower back, subsequent encounter: Secondary | ICD-10-CM | POA: Diagnosis not present

## 2021-08-11 DIAGNOSIS — M7542 Impingement syndrome of left shoulder: Secondary | ICD-10-CM | POA: Diagnosis not present

## 2021-08-11 DIAGNOSIS — G6181 Chronic inflammatory demyelinating polyneuritis: Secondary | ICD-10-CM | POA: Diagnosis not present

## 2021-08-16 ENCOUNTER — Telehealth: Payer: Self-pay

## 2021-08-16 DIAGNOSIS — G6181 Chronic inflammatory demyelinating polyneuritis: Secondary | ICD-10-CM | POA: Diagnosis not present

## 2021-08-16 NOTE — Telephone Encounter (Signed)
[employer] electric wheelchair needed the patients in person visit note for his wheelchair evaluation note faxed to them I faxed the note dated 04/20/2021 to [employer] and received a fax confirmation.  Breahna Boylen,cma

## 2021-08-19 ENCOUNTER — Ambulatory Visit: Payer: Medicare Other | Admitting: Urology

## 2021-08-19 DIAGNOSIS — G6181 Chronic inflammatory demyelinating polyneuritis: Secondary | ICD-10-CM | POA: Diagnosis not present

## 2021-08-20 DIAGNOSIS — G6181 Chronic inflammatory demyelinating polyneuritis: Secondary | ICD-10-CM | POA: Diagnosis not present

## 2021-08-23 DIAGNOSIS — G6181 Chronic inflammatory demyelinating polyneuritis: Secondary | ICD-10-CM | POA: Diagnosis not present

## 2021-08-26 ENCOUNTER — Other Ambulatory Visit: Payer: Self-pay | Admitting: Family Medicine

## 2021-08-29 MED ORDER — SILDENAFIL CITRATE 50 MG PO TABS
50.0000 mg | ORAL_TABLET | ORAL | 0 refills | Status: DC | PRN
  Filled 2021-08-29: qty 10, 30d supply, fill #0

## 2021-08-30 ENCOUNTER — Other Ambulatory Visit: Payer: Self-pay

## 2021-08-30 NOTE — Telephone Encounter (Signed)
Pt called in stating he's trying to get a wheelchair but it has been denied by [employer] due to them not being able to confirm with PCP. Pt states the medical director of [employer] needs to speak with Dr. Caryl Bis in order to receive his wheelchair. Pt doesn't have a number for the medical director but for [employer] itself, [phone]

## 2021-08-31 NOTE — Telephone Encounter (Signed)
Patient would like a order for a shower chair and a upright walker.  I did not know if this is okay so I did not order.  Please advise.  Also his wheelchair was denied and he has given me 4 numbers to call to speak to someone at [facility] and I cannot do that because of my time restrictions.   Velta Rockholt,cma

## 2021-08-31 NOTE — Telephone Encounter (Signed)
Patient called about his wheel chair information. Phone number is a peer to peer phone number, [phone]. Email address is [email or web address] Coordinator [place], [phone], Ext is [phone]. Might qualify for similar group 3 wheelchair with skin protection cushions.

## 2021-09-02 ENCOUNTER — Telehealth: Payer: Self-pay

## 2021-09-02 ENCOUNTER — Ambulatory Visit: Payer: Medicare Other | Admitting: Podiatry

## 2021-09-02 NOTE — Telephone Encounter (Signed)
An appeal was done today on the patients electric wheelchair with [employer] through [facility] and I called the patient to inform him that he would need to come to the office to sign a appointment of representative for me to represent him on the appeal and this form has to be faxed to [phone] the reference number is [id number] and the last OV note needs to be faxed with the form. Patient stated he would come on Tuesday to sign.    Theophile Harvie,cma

## 2021-09-02 NOTE — Progress Notes (Signed)
The peer to peer has passed  and I called [employer] and did a appeal for the wheelchair.  He was denied because he requested a pressure cushion or something to prevent pressure sores and they said he did not qualify.  I did the appeal over the phone with [facility] and she stated it could take 30 days but it normally does not and they will fax the result or mail the result of the appeal.  Faithlynn Deeley,cma

## 2021-09-06 ENCOUNTER — Telehealth: Payer: Self-pay | Admitting: Family Medicine

## 2021-09-06 NOTE — Telephone Encounter (Signed)
[name] from [employer] had a form faxed over last Friday 11/11 that needs to be signed and faxed back. It is an incontinence form so that they are able to continue billing his insurance.  [phone]

## 2021-09-07 NOTE — Telephone Encounter (Signed)
[name] from [employer] had a form faxed over last Friday 11/11 that needs to be signed and faxed back. It is an incontinence form so that they are able to continue billing his insurance.   [phone]  I do not have this form so you have it?  Joycelyn Liska,cma

## 2021-09-08 NOTE — Telephone Encounter (Signed)
Pt wife dropped form off. Placed in folder

## 2021-09-09 NOTE — Telephone Encounter (Signed)
Form signed. Please fax along with most recent visit notes.

## 2021-09-13 NOTE — Telephone Encounter (Signed)
Patient called to see if the paper work for his shower chair and walker have been faxed.

## 2021-09-15 ENCOUNTER — Ambulatory Visit: Payer: Medicare Other | Admitting: Urology

## 2021-09-18 NOTE — Telephone Encounter (Signed)
I called the patient and asked him when did the peer to peer needed to be completed by and he stated he thought it was 30 days. I tried the numbers he provided for [facility] and could not reach anyone.  Callyn Severtson,cma

## 2021-09-19 NOTE — Telephone Encounter (Signed)
I believe you already spoke with the company on this and they advised that the time frame for the peer-to-peer had passed quite some time ago. You can certainly reach out again to check on that though.

## 2021-09-21 ENCOUNTER — Other Ambulatory Visit: Payer: Self-pay

## 2021-09-21 DIAGNOSIS — M62569 Muscle wasting and atrophy, not elsewhere classified, unspecified lower leg: Secondary | ICD-10-CM

## 2021-09-22 NOTE — Telephone Encounter (Signed)
I called [employer] and they have started the process on the walker, I ordered the DME for the walker they needed a DME DX code for the shower chair and I could not find a code for that.  If you can help me with that DME and the code I will call and give it to them, they stated that is all they need and they will get those for the patient.  Aston Lawhorn,cma

## 2021-09-22 NOTE — Telephone Encounter (Signed)
You can use G61.81.

## 2021-09-26 ENCOUNTER — Other Ambulatory Visit: Payer: Self-pay | Admitting: Family Medicine

## 2021-09-28 ENCOUNTER — Ambulatory Visit: Payer: Medicare Other | Admitting: Gastroenterology

## 2021-09-30 ENCOUNTER — Telehealth: Payer: Self-pay | Admitting: Family Medicine

## 2021-09-30 NOTE — Telephone Encounter (Signed)
Pt called in requesting to speak with Michael Montgomery about a few concerns/questions he has regarding well care. Pt did not give more detail in regards to this.

## 2021-10-01 ENCOUNTER — Other Ambulatory Visit: Payer: Self-pay

## 2021-10-01 DIAGNOSIS — G6181 Chronic inflammatory demyelinating polyneuritis: Secondary | ICD-10-CM

## 2021-10-01 NOTE — Telephone Encounter (Signed)
I called to [facility] and started a new authorization for the patient a shower chair and a rollator walker.  The authorization number is [id number] and I sent all the information for the walker and the shower chair to [place] and they will deliver the shower chair and walker to the patient. Patient was notified.   Maida Widger,cma

## 2021-10-01 NOTE — Progress Notes (Unsigned)
DME 

## 2021-10-06 ENCOUNTER — Telehealth: Payer: Self-pay

## 2021-10-06 NOTE — Telephone Encounter (Signed)
I signed off on this yesterday and placed it in the signed folder. It should not need to be signed again.

## 2021-10-11 ENCOUNTER — Other Ambulatory Visit: Payer: Self-pay | Admitting: Internal Medicine

## 2021-10-11 ENCOUNTER — Telehealth: Payer: Self-pay | Admitting: Family Medicine

## 2021-10-11 NOTE — Telephone Encounter (Signed)
Kaberlin from [employer] dropped off some forms for the provider to sign concerning a mobility device for the pt. Placed in sign basket.  Taziah Difatta,cma

## 2021-10-11 NOTE — Telephone Encounter (Signed)
Kaberlin from [employer] dropped off some forms for the provider to sign concerning a mobility device for the pt. Placed in colored folder up front

## 2021-10-12 NOTE — Telephone Encounter (Signed)
Signed.  Please fax back. 

## 2021-10-13 NOTE — Telephone Encounter (Signed)
Paperwork for [employer] was faxed  and confirmation given.  Appollonia Klee,cma

## 2021-10-14 ENCOUNTER — Ambulatory Visit: Payer: Medicare Other | Admitting: Podiatry

## 2021-10-27 ENCOUNTER — Ambulatory Visit: Payer: Medicare Other | Admitting: Urology

## 2021-10-29 ENCOUNTER — Encounter: Payer: Self-pay | Admitting: Urology

## 2021-10-30 ENCOUNTER — Encounter: Payer: Self-pay | Admitting: Gastroenterology

## 2021-11-11 ENCOUNTER — Ambulatory Visit: Payer: Medicare Other | Admitting: Gastroenterology

## 2021-11-11 NOTE — Progress Notes (Deleted)
Primary Care Physician: Leone Haven, MD  Primary Gastroenterologist:  Dr. Lucilla Lame  No chief complaint on file.   HPI: Michael Montgomery is a 61 y.o. male here for follow-up of abnormal liver enzymes.  The patient had seen me in the past for abnormal liver enzymes and was found to have a liver biopsy showed:  DIAGNOSIS:  A. LIVER, RIGHT LOBE; ULTRASOUND-GUIDED NEEDLE CORE BIOPSY:  - MILD STEATOSIS WITH EQUIVOCAL FEATURES OF STEATOHEPATITIS (NAFLD SCORE  3 OF 8).  - FIBROSIS STAGE 0 (NONE).   The patient's liver enzymes have come down from his previous levels but are still elevated.  His most recent trends of his labs should have shown:  Component     Latest Ref Rng & Units 07/12/2019 07/26/2019 08/06/2019 08/26/2019  Total Bilirubin     0.2 - 1.2 mg/dL 0.5 0.4 0.3   Alkaline Phosphatase     39 - 117 U/L  99 139 (H)   AST     0 - 37 U/L 289 (H) 202 (H) 345 (H)   ALT     0 - 53 U/L 293 (H) 225 (H) 322 (H)    Component     Latest Ref Rng & Units 10/10/2019 04/20/2021  Total Bilirubin     0.2 - 1.2 mg/dL  0.6  Alkaline Phosphatase     39 - 117 U/L  147 (H)  AST     0 - 37 U/L  52 (H)  ALT     0 - 53 U/L  70 (H)    Past Medical History:  Diagnosis Date   Cataract march, april   bilateral removed    Chickenpox    Diabetes mellitus without complication ([place])    metformin    Diabetic amyotrophy ([place])    sees dr at [facility]   Gastric ulcer    Heart murmur    When he was younger, no recent issues   High blood pressure    controlled with meds   Hypercholesteremia    controlled with medication   Knee injury    left   Motion sickness    boats   Neuropathy     Current Outpatient Medications  Medication Sig Dispense Refill   amLODipine (NORVASC) 10 MG tablet TAKE 1 TABLET BY MOUTH  DAILY 90 tablet 3   aspirin EC 81 MG tablet Take 81 mg by mouth daily. Swallow whole.     atorvastatin (LIPITOR) 40 MG tablet TAKE 1 TABLET BY MOUTH EVERY DAY 90 tablet 1    clomiPHENE (CLOMID) 50 MG tablet Take 1/2 tablet daily 30 tablet 3   gabapentin (NEURONTIN) 300 MG capsule Take 1 capsule by mouth 2 (two) times daily. Take 1 cap in AM and 2 caps at bedtime     JARDIANCE 10 MG TABS tablet TAKE 1 TABLET BY MOUTH EVERY DAY BEFORE BREAKFAST 30 tablet 2   LINZESS 72 MCG capsule TAKE 1 CAPSULE BY MOUTH DAILY BEFORE BREAKFAST. 30 capsule 1   loratadine (CLARITIN) 10 MG tablet TAKE 1 TABLET EVERY DAY 30 tablet 1   Multiple Vitamin (MULTIVITAMIN) capsule Take 1 capsule by mouth daily.     Nutritional Supplements (ENSURE ACTIVE HIGH PROTEIN) LIQD Take 1 Can by mouth 3 (three) times daily as needed. 21330 mL 11   Omega-3 Fatty Acids (FISH OIL PO) Take 1 capsule by mouth daily.      omeprazole (PRILOSEC) 20 MG capsule TAKE 1 CAPSULE BY MOUTH EVERY DAY 90  capsule 1   sertraline (ZOLOFT) 100 MG tablet Take 1 tablet (100 mg total) by mouth daily. 90 tablet 3   sildenafil (VIAGRA) 50 MG tablet Take 1 tablet (50 mg total) by mouth as needed for erectile dysfunction. 10 tablet 0   sodium chloride 0.9 % infusion Inject into the vein.     No current facility-administered medications for this visit.    Allergies as of 11/11/2021   (No Known Allergies)    ROS:  General: Negative for anorexia, weight loss, fever, chills, fatigue, weakness. ENT: Negative for hoarseness, difficulty swallowing , nasal congestion. CV: Negative for chest pain, angina, palpitations, dyspnea on exertion, peripheral edema.  Respiratory: Negative for dyspnea at rest, dyspnea on exertion, cough, sputum, wheezing.  GI: See history of present illness. GU:  Negative for dysuria, hematuria, urinary incontinence, urinary frequency, nocturnal urination.  Endo: Negative for unusual weight change.    Physical Examination:   There were no vitals taken for this visit.  General: Well-nourished, well-developed in no acute distress.  Eyes: No icterus. Conjunctivae pink. Lungs: Clear to auscultation  bilaterally. Non-labored. Heart: Regular rate and rhythm, no murmurs rubs or gallops.  Abdomen: Bowel sounds are normal, nontender, nondistended, no hepatosplenomegaly or masses, no abdominal bruits or hernia , no rebound or guarding.   Extremities: No lower extremity edema. No clubbing or deformities. Neuro: Alert and oriented x 3.  Grossly intact. Skin: Warm and dry, no jaundice.   Psych: Alert and cooperative, normal mood and affect.  Labs:    Imaging Studies: No results found.  Assessment and Plan:   Michael Montgomery is a 61 y.o. y/o male ***     Lucilla Lame, MD. Marval Regal    Note: This dictation was prepared with Dragon dictation along with smaller phrase technology. Any transcriptional errors that result from this process are unintentional.

## 2021-11-18 DIAGNOSIS — M48061 Spinal stenosis, lumbar region without neurogenic claudication: Secondary | ICD-10-CM | POA: Diagnosis not present

## 2021-11-18 DIAGNOSIS — Z9181 History of falling: Secondary | ICD-10-CM | POA: Diagnosis not present

## 2021-11-18 DIAGNOSIS — G6181 Chronic inflammatory demyelinating polyneuritis: Secondary | ICD-10-CM | POA: Diagnosis not present

## 2021-11-18 DIAGNOSIS — U099 Post covid-19 condition, unspecified: Secondary | ICD-10-CM | POA: Diagnosis not present

## 2021-11-23 ENCOUNTER — Telehealth: Payer: Self-pay

## 2021-11-23 NOTE — Telephone Encounter (Signed)
Patient was approved for a power wheelchair all except for the seat elevator and electronic connection.   Case# [id number]. Auth. # [id number] Michael Montgomery,cma

## 2021-11-24 ENCOUNTER — Other Ambulatory Visit: Payer: Self-pay

## 2021-11-29 ENCOUNTER — Other Ambulatory Visit: Payer: Self-pay | Admitting: Family Medicine

## 2021-11-29 DIAGNOSIS — G6181 Chronic inflammatory demyelinating polyneuritis: Secondary | ICD-10-CM | POA: Diagnosis not present

## 2021-11-29 DIAGNOSIS — E785 Hyperlipidemia, unspecified: Secondary | ICD-10-CM

## 2021-11-29 NOTE — Telephone Encounter (Signed)
It looks like he never saw GI for this. At this point I would suggest that we recheck his hepatic function panel prior to refilling his lipitor. Thanks.

## 2021-11-30 DIAGNOSIS — G6181 Chronic inflammatory demyelinating polyneuritis: Secondary | ICD-10-CM | POA: Diagnosis not present

## 2021-11-30 NOTE — Telephone Encounter (Signed)
I called the patient and he is scheduled for a lab appointment to recheck his cholesterol.  Michael Montgomery,cma

## 2021-12-01 DIAGNOSIS — G6181 Chronic inflammatory demyelinating polyneuritis: Secondary | ICD-10-CM | POA: Diagnosis not present

## 2021-12-02 DIAGNOSIS — G6181 Chronic inflammatory demyelinating polyneuritis: Secondary | ICD-10-CM | POA: Diagnosis not present

## 2021-12-07 ENCOUNTER — Telehealth: Payer: Self-pay | Admitting: *Deleted

## 2021-12-07 DIAGNOSIS — E119 Type 2 diabetes mellitus without complications: Secondary | ICD-10-CM

## 2021-12-07 DIAGNOSIS — E785 Hyperlipidemia, unspecified: Secondary | ICD-10-CM

## 2021-12-07 DIAGNOSIS — R7989 Other specified abnormal findings of blood chemistry: Secondary | ICD-10-CM

## 2021-12-07 NOTE — Telephone Encounter (Signed)
Please place future orders for lab appt.  

## 2021-12-07 NOTE — Telephone Encounter (Signed)
Ordered

## 2021-12-09 ENCOUNTER — Other Ambulatory Visit: Payer: Self-pay | Admitting: Family Medicine

## 2021-12-14 ENCOUNTER — Other Ambulatory Visit: Payer: Self-pay | Admitting: Family Medicine

## 2021-12-14 ENCOUNTER — Other Ambulatory Visit: Payer: Medicare Other

## 2021-12-23 ENCOUNTER — Other Ambulatory Visit: Payer: Self-pay

## 2021-12-23 ENCOUNTER — Other Ambulatory Visit: Payer: Medicare Other

## 2021-12-23 ENCOUNTER — Emergency Department: Payer: Medicare Other

## 2021-12-23 ENCOUNTER — Inpatient Hospital Stay
Admission: EM | Admit: 2021-12-23 | Discharge: 2021-12-25 | DRG: 871 | Disposition: A | Discharge status: Home Health | Payer: Medicare Other | Attending: Internal Medicine | Admitting: Internal Medicine

## 2021-12-23 DIAGNOSIS — J449 Chronic obstructive pulmonary disease, unspecified: Secondary | ICD-10-CM | POA: Diagnosis present

## 2021-12-23 DIAGNOSIS — R0689 Other abnormalities of breathing: Secondary | ICD-10-CM | POA: Diagnosis not present

## 2021-12-23 DIAGNOSIS — M85871 Other specified disorders of bone density and structure, right ankle and foot: Secondary | ICD-10-CM | POA: Diagnosis not present

## 2021-12-23 DIAGNOSIS — L03031 Cellulitis of right toe: Secondary | ICD-10-CM | POA: Diagnosis present

## 2021-12-23 DIAGNOSIS — Z743 Need for continuous supervision: Secondary | ICD-10-CM | POA: Diagnosis not present

## 2021-12-23 DIAGNOSIS — E1010 Type 1 diabetes mellitus with ketoacidosis without coma: Principal | ICD-10-CM | POA: Diagnosis present

## 2021-12-23 DIAGNOSIS — E1310 Other specified diabetes mellitus with ketoacidosis without coma: Secondary | ICD-10-CM

## 2021-12-23 DIAGNOSIS — R6889 Other general symptoms and signs: Secondary | ICD-10-CM | POA: Diagnosis not present

## 2021-12-23 DIAGNOSIS — Z7984 Long term (current) use of oral hypoglycemic drugs: Secondary | ICD-10-CM

## 2021-12-23 DIAGNOSIS — Z825 Family history of asthma and other chronic lower respiratory diseases: Secondary | ICD-10-CM | POA: Diagnosis not present

## 2021-12-23 DIAGNOSIS — K4090 Unilateral inguinal hernia, without obstruction or gangrene, not specified as recurrent: Secondary | ICD-10-CM | POA: Diagnosis not present

## 2021-12-23 DIAGNOSIS — Z961 Presence of intraocular lens: Secondary | ICD-10-CM | POA: Diagnosis present

## 2021-12-23 DIAGNOSIS — R269 Unspecified abnormalities of gait and mobility: Secondary | ICD-10-CM | POA: Diagnosis present

## 2021-12-23 DIAGNOSIS — Z833 Family history of diabetes mellitus: Secondary | ICD-10-CM | POA: Diagnosis not present

## 2021-12-23 DIAGNOSIS — Z9842 Cataract extraction status, left eye: Secondary | ICD-10-CM

## 2021-12-23 DIAGNOSIS — G6181 Chronic inflammatory demyelinating polyneuritis: Secondary | ICD-10-CM | POA: Diagnosis not present

## 2021-12-23 DIAGNOSIS — E875 Hyperkalemia: Secondary | ICD-10-CM | POA: Diagnosis present

## 2021-12-23 DIAGNOSIS — A419 Sepsis, unspecified organism: Secondary | ICD-10-CM | POA: Diagnosis not present

## 2021-12-23 DIAGNOSIS — Z9841 Cataract extraction status, right eye: Secondary | ICD-10-CM | POA: Diagnosis not present

## 2021-12-23 DIAGNOSIS — R000 Tachycardia, unspecified: Secondary | ICD-10-CM | POA: Diagnosis not present

## 2021-12-23 DIAGNOSIS — K5909 Other constipation: Secondary | ICD-10-CM | POA: Diagnosis not present

## 2021-12-23 DIAGNOSIS — Z7401 Bed confinement status: Secondary | ICD-10-CM | POA: Diagnosis not present

## 2021-12-23 DIAGNOSIS — F17290 Nicotine dependence, other tobacco product, uncomplicated: Secondary | ICD-10-CM | POA: Diagnosis present

## 2021-12-23 DIAGNOSIS — K219 Gastro-esophageal reflux disease without esophagitis: Secondary | ICD-10-CM | POA: Diagnosis not present

## 2021-12-23 DIAGNOSIS — Z8249 Family history of ischemic heart disease and other diseases of the circulatory system: Secondary | ICD-10-CM

## 2021-12-23 DIAGNOSIS — E1044 Type 1 diabetes mellitus with diabetic amyotrophy: Secondary | ICD-10-CM | POA: Diagnosis not present

## 2021-12-23 DIAGNOSIS — M19071 Primary osteoarthritis, right ankle and foot: Secondary | ICD-10-CM | POA: Diagnosis not present

## 2021-12-23 DIAGNOSIS — I10 Essential (primary) hypertension: Secondary | ICD-10-CM | POA: Diagnosis not present

## 2021-12-23 DIAGNOSIS — E7800 Pure hypercholesterolemia, unspecified: Secondary | ICD-10-CM | POA: Diagnosis present

## 2021-12-23 DIAGNOSIS — Z20822 Contact with and (suspected) exposure to covid-19: Secondary | ICD-10-CM | POA: Diagnosis present

## 2021-12-23 DIAGNOSIS — E1110 Type 2 diabetes mellitus with ketoacidosis without coma: Secondary | ICD-10-CM | POA: Diagnosis present

## 2021-12-23 DIAGNOSIS — N2889 Other specified disorders of kidney and ureter: Secondary | ICD-10-CM | POA: Diagnosis not present

## 2021-12-23 DIAGNOSIS — K449 Diaphragmatic hernia without obstruction or gangrene: Secondary | ICD-10-CM | POA: Diagnosis not present

## 2021-12-23 LAB — CBC WITH DIFFERENTIAL/PLATELET
Abs Immature Granulocytes: 0.28 10*3/uL — ABNORMAL HIGH (ref 0.00–0.07)
Basophils Absolute: 0.1 10*3/uL (ref 0.0–0.1)
Basophils Relative: 0 %
Eosinophils Absolute: 0 10*3/uL (ref 0.0–0.5)
Eosinophils Relative: 0 %
HCT: 45.6 % (ref 39.0–52.0)
Hemoglobin: 14.2 g/dL (ref 13.0–17.0)
Immature Granulocytes: 2 %
Lymphocytes Relative: 5 %
Lymphs Abs: 0.9 10*3/uL (ref 0.7–4.0)
MCH: 29.2 pg (ref 26.0–34.0)
MCHC: 31.1 g/dL (ref 30.0–36.0)
MCV: 93.8 fL (ref 80.0–100.0)
Monocytes Absolute: 0.8 10*3/uL (ref 0.1–1.0)
Monocytes Relative: 4 %
Neutro Abs: 16.9 10*3/uL — ABNORMAL HIGH (ref 1.7–7.7)
Neutrophils Relative %: 89 %
Platelets: 269 10*3/uL (ref 150–400)
RBC: 4.86 MIL/uL (ref 4.22–5.81)
RDW: 15.4 % (ref 11.5–15.5)
WBC: 18.9 10*3/uL — ABNORMAL HIGH (ref 4.0–10.5)
nRBC: 0 % (ref 0.0–0.2)

## 2021-12-23 LAB — CBG MONITORING, ED
Glucose-Capillary: 163 mg/dL — ABNORMAL HIGH (ref 70–99)
Glucose-Capillary: 135 mg/dL — ABNORMAL HIGH (ref 70–99)
Glucose-Capillary: 147 mg/dL — ABNORMAL HIGH (ref 70–99)
Glucose-Capillary: 123 mg/dL — ABNORMAL HIGH (ref 70–99)
Glucose-Capillary: 139 mg/dL — ABNORMAL HIGH (ref 70–99)
Glucose-Capillary: 130 mg/dL — ABNORMAL HIGH (ref 70–99)

## 2021-12-23 LAB — URINALYSIS, COMPLETE (UACMP) WITH MICROSCOPIC
Bacteria, UA: NONE SEEN
Bilirubin Urine: NEGATIVE
Glucose, UA: >=500 mg/dL — AB
Ketones, ur: 80 mg/dL — AB
Leukocytes,Ua: NEGATIVE
Nitrite: NEGATIVE
Protein, ur: 100 mg/dL — AB
Specific Gravity, Urine: 1.016 (ref 1.005–1.030)
Squamous Epithelial / HPF: NONE SEEN (ref 0–5)
pH: 5 (ref 5.0–8.0)

## 2021-12-23 LAB — COMPREHENSIVE METABOLIC PANEL
ALT: 28 U/L (ref 0–44)
AST: 37 U/L (ref 15–41)
Albumin: 4.3 g/dL (ref 3.5–5.0)
Alkaline Phosphatase: 155 U/L — ABNORMAL HIGH (ref 38–126)
Anion gap: 23 — ABNORMAL HIGH (ref 5–15)
BUN: 18 mg/dL (ref 6–20)
CO2: 8 mmol/L — ABNORMAL LOW (ref 22–32)
Calcium: 9.5 mg/dL (ref 8.9–10.3)
Chloride: 102 mmol/L (ref 98–111)
Creatinine, Ser: 0.85 mg/dL (ref 0.61–1.24)
GFR, Estimated: >60 mL/min (ref >60)
Glucose, Bld: 179 mg/dL — ABNORMAL HIGH (ref 70–99)
Potassium: 5.6 mmol/L — ABNORMAL HIGH (ref 3.5–5.1)
Sodium: 133 mmol/L — ABNORMAL LOW (ref 135–145)
Total Bilirubin: 1.7 mg/dL — ABNORMAL HIGH (ref 0.3–1.2)
Total Protein: 9.4 g/dL — ABNORMAL HIGH (ref 6.5–8.1)

## 2021-12-23 LAB — RESP PANEL BY RT-PCR (FLU A&B, COVID) ARPGX2
Influenza A by PCR: NEGATIVE
Influenza B by PCR: NEGATIVE
SARS Coronavirus 2 by RT PCR: NEGATIVE

## 2021-12-23 LAB — HIV ANTIBODY (ROUTINE TESTING W REFLEX): HIV Screen 4th Generation wRfx: NONREACTIVE

## 2021-12-23 LAB — GLUCOSE, CAPILLARY
Glucose-Capillary: 137 mg/dL — ABNORMAL HIGH (ref 70–99)
Glucose-Capillary: 108 mg/dL — ABNORMAL HIGH (ref 70–99)

## 2021-12-23 LAB — BLOOD GAS, VENOUS
Acid-base deficit: 22.1 mmol/L — ABNORMAL HIGH (ref 0.0–2.0)
Bicarbonate: 7.3 mmol/L — ABNORMAL LOW (ref 20.0–28.0)
O2 Saturation: 58 %
Patient temperature: 37
pCO2, Ven: 27 mmHg — ABNORMAL LOW (ref 44–60)
pH, Ven: 7.04 — CL (ref 7.25–7.43)
pO2, Ven: 36 mmHg (ref 32–45)

## 2021-12-23 LAB — BETA-HYDROXYBUTYRIC ACID
Beta-Hydroxybutyric Acid: >8 mmol/L — ABNORMAL HIGH (ref 0.05–0.27)
Beta-Hydroxybutyric Acid: 5.66 mmol/L — ABNORMAL HIGH (ref 0.05–0.27)

## 2021-12-23 LAB — LACTIC ACID, PLASMA: Lactic Acid, Venous: 1.4 mmol/L (ref 0.5–1.9)

## 2021-12-23 LAB — BASIC METABOLIC PANEL
Anion gap: 16 — ABNORMAL HIGH (ref 5–15)
BUN: 16 mg/dL (ref 6–20)
CO2: 12 mmol/L — ABNORMAL LOW (ref 22–32)
Calcium: 8.9 mg/dL (ref 8.9–10.3)
Chloride: 111 mmol/L (ref 98–111)
Creatinine, Ser: 0.79 mg/dL (ref 0.61–1.24)
GFR, Estimated: >60 mL/min (ref >60)
Glucose, Bld: 120 mg/dL — ABNORMAL HIGH (ref 70–99)
Potassium: 4.3 mmol/L (ref 3.5–5.1)
Sodium: 139 mmol/L (ref 135–145)

## 2021-12-23 LAB — MAGNESIUM: Magnesium: 2 mg/dL (ref 1.7–2.4)

## 2021-12-23 LAB — MRSA NEXT GEN BY PCR, NASAL: MRSA by PCR Next Gen: NOT DETECTED

## 2021-12-23 MED ORDER — METRONIDAZOLE 500 MG/100ML IV SOLN
500.0000 mg | Freq: Once | INTRAVENOUS | Status: AC
  Administered 2021-12-23: 500 mg via INTRAVENOUS
  Filled 2021-12-23: qty 100

## 2021-12-23 MED ORDER — SODIUM CHLORIDE 0.9 % IV BOLUS
1000.0000 mL | Freq: Once | INTRAVENOUS | Status: AC
  Administered 2021-12-23: 1000 mL via INTRAVENOUS

## 2021-12-23 MED ORDER — LINACLOTIDE 72 MCG PO CAPS
72.0000 ug | ORAL_CAPSULE | Freq: Every day | ORAL | Status: DC
  Administered 2021-12-25: 72 ug via ORAL
  Filled 2021-12-23: qty 1

## 2021-12-23 MED ORDER — SODIUM CHLORIDE 0.9 % IV SOLN
2.0000 g | Freq: Once | INTRAVENOUS | Status: AC
  Administered 2021-12-23: 2 g via INTRAVENOUS
  Filled 2021-12-23: qty 2

## 2021-12-23 MED ORDER — GABAPENTIN 100 MG PO CAPS
200.0000 mg | ORAL_CAPSULE | Freq: Three times a day (TID) | ORAL | Status: DC
  Administered 2021-12-23 – 2021-12-25 (×5): 200 mg via ORAL
  Filled 2021-12-23 (×5): qty 2

## 2021-12-23 MED ORDER — AMLODIPINE BESYLATE 10 MG PO TABS
10.0000 mg | ORAL_TABLET | Freq: Every day | ORAL | Status: DC
  Administered 2021-12-24 – 2021-12-25 (×2): 10 mg via ORAL
  Filled 2021-12-23 (×2): qty 1

## 2021-12-23 MED ORDER — IOHEXOL 300 MG/ML  SOLN
100.0000 mL | Freq: Once | INTRAMUSCULAR | Status: AC | PRN
  Administered 2021-12-23: 100 mL via INTRAVENOUS

## 2021-12-23 MED ORDER — PANTOPRAZOLE SODIUM 40 MG PO TBEC
40.0000 mg | DELAYED_RELEASE_TABLET | Freq: Every day | ORAL | Status: DC
  Administered 2021-12-23 – 2021-12-25 (×3): 40 mg via ORAL
  Filled 2021-12-23 (×3): qty 1

## 2021-12-23 MED ORDER — SODIUM ZIRCONIUM CYCLOSILICATE 5 G PO PACK
10.0000 g | PACK | Freq: Once | ORAL | Status: DC
  Filled 2021-12-23: qty 1

## 2021-12-23 MED ORDER — VANCOMYCIN HCL 1500 MG/300ML IV SOLN
1500.0000 mg | Freq: Once | INTRAVENOUS | Status: AC
  Administered 2021-12-23: 1500 mg via INTRAVENOUS
  Filled 2021-12-23: qty 300

## 2021-12-23 MED ORDER — CHLORHEXIDINE GLUCONATE CLOTH 2 % EX PADS
6.0000 | MEDICATED_PAD | Freq: Every day | CUTANEOUS | Status: DC
  Administered 2021-12-23 (×2): 6 via TOPICAL

## 2021-12-23 MED ORDER — VANCOMYCIN HCL IN DEXTROSE 1-5 GM/200ML-% IV SOLN: 1000.0000 mg | Freq: Once | INTRAVENOUS | Status: DC

## 2021-12-23 MED ORDER — SERTRALINE HCL 50 MG PO TABS
100.0000 mg | ORAL_TABLET | Freq: Every day | ORAL | Status: DC
Start: 2021-12-23 — End: 2021-12-25
  Administered 2021-12-23 – 2021-12-25 (×3): 100 mg via ORAL
  Filled 2021-12-23 (×3): qty 2

## 2021-12-23 MED ORDER — MULTIVITAMINS PO CAPS: 1.0000 | ORAL_CAPSULE | Freq: Every day | ORAL | Status: DC

## 2021-12-23 MED ORDER — INSULIN REGULAR(HUMAN) IN NACL 100-0.9 UT/100ML-% IV SOLN
INTRAVENOUS | Status: DC
  Administered 2021-12-23: 3 [IU]/h via INTRAVENOUS
  Filled 2021-12-23: qty 100

## 2021-12-23 MED ORDER — ENOXAPARIN SODIUM 40 MG/0.4ML IJ SOSY
40.0000 mg | PREFILLED_SYRINGE | INTRAMUSCULAR | Status: DC
  Administered 2021-12-23 – 2021-12-24 (×2): 40 mg via SUBCUTANEOUS
  Filled 2021-12-23 (×2): qty 0.4

## 2021-12-23 MED ORDER — ADULT MULTIVITAMIN W/MINERALS CH
1.0000 | ORAL_TABLET | Freq: Every day | ORAL | Status: DC
  Administered 2021-12-24 – 2021-12-25 (×2): 1 via ORAL
  Filled 2021-12-23 (×2): qty 1

## 2021-12-23 MED ORDER — DEXTROSE-NACL 5-0.45 % IV SOLN: INTRAVENOUS | Status: DC

## 2021-12-23 MED ORDER — ATORVASTATIN CALCIUM 20 MG PO TABS
40.0000 mg | ORAL_TABLET | Freq: Every day | ORAL | Status: DC
Start: 2021-12-24 — End: 2021-12-25
  Administered 2021-12-24: 40 mg via ORAL
  Filled 2021-12-23 (×2): qty 2

## 2021-12-23 MED ORDER — LACTATED RINGERS IV SOLN: INTRAVENOUS | Status: DC

## 2021-12-23 MED ORDER — ASPIRIN EC 81 MG PO TBEC
81.0000 mg | DELAYED_RELEASE_TABLET | Freq: Every day | ORAL | Status: DC
  Administered 2021-12-23 – 2021-12-25 (×3): 81 mg via ORAL
  Filled 2021-12-23 (×3): qty 1

## 2021-12-23 MED ORDER — DEXTROSE IN LACTATED RINGERS 5 % IV SOLN: INTRAVENOUS | Status: DC

## 2021-12-23 MED ORDER — DEXTROSE 50 % IV SOLN: 0.0000 mL | INTRAVENOUS | Status: DC | PRN

## 2021-12-23 NOTE — ED Triage Notes (Signed)
Pt arrives via EMS from home after his home health nurse noticed his toes were becoming infected and "popping open"- it was first notied on Monday- pt has a wound noted to his R big toe, but no other open areas- pt was tachycardic and tachypnic with a temp of 96.6 orally and a cbg of 168 per EMS- pt is bed bound with a hx of cipd ?

## 2021-12-23 NOTE — H&P (Signed)
H&P:    Michael Montgomery   RDE:[id number] DOB: 1961-05-28 DOA: 12/23/2021  PCP: Leone Haven, MD  Chief Complaint: Right foot pain   History of Present Illness:    HPI: Michael Montgomery is a 61 y.o. male with a past medical history of non-insulin-dependent diabetes mellitus type 2, diabetic neuropathy, ambulatory dysfunction, chronic constipation, essential hypertension, GERD, dyslipidemia.  This patient presents to the emergency department for right toe pain.  Stated he stubbed his toe against the wall after using a new wheelchair on Monday.  Had pain in his right toe with some superficial abrasion and per patient some drainage as well.  No fevers or chills.  In the emergency department he is found to be in euglycemic DKA.  His endorse some intermittent nausea vomiting and diarrhea for the past few days as well.  ED Course: Code sepsis called.  Cellulitis of the right great toe.  No significant diabetic foot wound of the right toe.  Lab work consistent with euglycemic DKA.  Sodium 133, potassium 5.6 bicarb 8 anion gap 23 and pH 7.0.  Started on insulin drip per DKA protocol.  Chest x-ray negative.  Right foot x-ray negative.  CT abdomen pelvis unremarkable.  Beta-hydroxybutyrate greater than 8.  WBC 18.   ROS:   13 point review of systems is negative except for what is mentioned above in the HPI.   Past Medical History:   Past Medical History:  Diagnosis Date   Cataract march, april   bilateral removed    Chickenpox    Diabetes mellitus without complication ([place])    metformin    Diabetic amyotrophy ([place])    sees dr at [facility]   Gastric ulcer    Heart murmur    When he was younger, no recent issues   High blood pressure    controlled with meds   Hypercholesteremia    controlled with medication   Knee injury    left   Motion sickness    boats   Neuropathy     Past Surgical History:   Past Surgical History:  Procedure Laterality Date   CATARACT EXTRACTION  W/PHACO Right 02/01/2016   Procedure: CATARACT EXTRACTION PHACO AND INTRAOCULAR LENS PLACEMENT ([place]) right;  Surgeon: Ronnell Freshwater, MD;  Location: [place];  Service: Ophthalmology;  Laterality: Right;  DIABETIC - oral meds   CATARACT EXTRACTION W/PHACO Left 03/07/2016   Procedure: CATARACT EXTRACTION PHACO AND INTRAOCULAR LENS PLACEMENT (I[facility]);  Surgeon: Ronnell Freshwater, MD;  Location: [place];  Service: Ophthalmology;  Laterality: Left;  DIABETIC - oral meds    circumscision      Social History:   Social History   Socioeconomic History   Marital status: Married    Spouse name: Lamorris Knoblock   Number of children: 2   Years of education: 12   Highest education level: 12th grade  Occupational History   Occupation: Disabled  Tobacco Use   Smoking status: Former    Types: Cigars   Smokeless tobacco: Never   Tobacco comments:    one cigar once a month to once every 2 months   Vaping Use   Vaping Use: Never used  Substance and Sexual Activity   Alcohol use: Yes    Alcohol/week: 5.0 standard drinks    Types: 5 Shots of liquor per week    Comment: pt stated " every once in a while"   Drug use: No   Sexual activity: Not Currently  Other Topics Concern   Not on file  Social History Narrative   Not on file   Social Determinants of Health   Financial Resource Strain: Not on file  Food Insecurity: No Food Insecurity   Worried About Running Out of Food in the Last Year: Never true   Ran Out of Food in the Last Year: Never true  Transportation Needs: Not on file  Physical Activity: Not on file  Stress: Not on file  Social Connections: Not on file  Intimate Partner Violence: Not At Risk   Fear of Current or Ex-Partner: No   Emotionally Abused: No   Physically Abused: No   Sexually Abused: No    Allergies:   No Known Allergies  Family History:   Family History  Problem Relation Age of Onset   Alcoholism Father     Hyperlipidemia Father    Hypertension Father    Throat cancer Father        smoked but had stopped 53 yeras before dx   Diabetes Mother    Emphysema Mother    Kidney disease Neg Hx    Prostate cancer Neg Hx    Colon cancer Neg Hx    Colon polyps Neg Hx    Esophageal cancer Neg Hx    Rectal cancer Neg Hx    Stomach cancer Neg Hx    Kidney cancer Neg Hx    Bladder Cancer Neg Hx      Current Medications:   Prior to Admission medications   Medication Sig Start Date End Date Taking? Authorizing Provider  amLODipine (NORVASC) 10 MG tablet TAKE 1 TABLET BY MOUTH  DAILY Patient taking differently: Take 10 mg by mouth daily. 10/12/21  Yes Einar Pheasant, MD  aspirin EC 81 MG tablet Take 81 mg by mouth daily. Swallow whole.   Yes [provider]  atorvastatin (LIPITOR) 40 MG tablet TAKE 1 TABLET BY MOUTH EVERY DAY Patient taking differently: Take 40 mg by mouth daily. 05/17/21  Yes Leone Haven, MD  gabapentin (NEURONTIN) 100 MG capsule Take 200 mg by mouth 3 (three) times daily. 12/22/21  Yes [provider]  JARDIANCE 10 MG TABS tablet TAKE 1 TABLET BY MOUTH EVERY DAY BEFORE BREAKFAST Patient taking differently: Take 10 mg by mouth daily. 12/09/21  Yes Leone Haven, MD  LINZESS 72 MCG capsule TAKE 1 CAPSULE BY MOUTH EVERY DAY BEFORE BREAKFAST Patient taking differently: Take 72 mcg by mouth daily before breakfast. 12/14/21  Yes Leone Haven, MD  Multiple Vitamin (MULTIVITAMIN) capsule Take 1 capsule by mouth daily.   Yes [provider]  Omega-3 Fatty Acids (FISH OIL PO) Take 1 capsule by mouth daily.    Yes [provider]  omeprazole (PRILOSEC) 20 MG capsule TAKE 1 CAPSULE BY MOUTH EVERY DAY Patient taking differently: Take 20 mg by mouth daily. 02/08/21  Yes Leone Haven, MD  sertraline (ZOLOFT) 100 MG tablet Take 1 tablet (100 mg total) by mouth daily. 07/05/21  Yes Leone Haven, MD  Nutritional Supplements (ENSURE ACTIVE  HIGH PROTEIN) LIQD Take 1 Can by mouth 3 (three) times daily as needed. 02/20/20   Leone Haven, MD  sildenafil (VIAGRA) 50 MG tablet Take 1 tablet (50 mg total) by mouth as needed for erectile dysfunction. 08/29/21   Leone Haven, MD  sodium chloride 0.9 % infusion Inject into the vein. 02/05/21   [provider]     Physical Exam:   Vitals:   12/23/21  1248 12/23/21 1250  Pulse:  (!) 118  Resp:  (!) 24  Temp:  97.8 F (36.6 C)  TempSrc:  Oral  SpO2:  97%  Weight: 71.7 kg   Height: 5\' 11"  (1.803 m)      General:  Appears calm and comfortable and is in NAD Cardiovascular:  RRR, no m/r/g.  Respiratory:   CTA bilaterally with no wheezes/rales/rhonchi.  Normal respiratory effort. Abdomen:  soft, NT, ND, NABS Skin:  no rash or induration seen on limited exam Musculoskeletal:  grossly normal tone BUE/BLE, good ROM, no bony abnormality Lower extremity:  No LE edema.  Limited foot exam with no ulcerations.  2+ distal pulses.  Right great toe mildly swollen with mild erythema.  Superficial abrasion on the dorsal surface no purulent drainage.  Mild to moderate tenderness to palpation. Psychiatric:  grossly normal mood and affect, speech fluent and appropriate, AOx3 Neurologic:  CN 2-12 grossly intact, moves all extremities in coordinated fashion, sensation intact    Data Review:    Radiological Exams on Admission: Independently reviewed - see discussion in A/P where applicable  CT ABDOMEN PELVIS W CONTRAST  Result Date: 12/23/2021 CLINICAL DATA:  Sepsis EXAM: CT ABDOMEN AND PELVIS WITH CONTRAST TECHNIQUE: Multidetector CT imaging of the abdomen and pelvis was performed using the standard protocol following bolus administration of intravenous contrast. RADIATION DOSE REDUCTION: This exam was performed according to the departmental dose-optimization program which includes automated exposure control, adjustment of the mA and/or kV according to patient size and/or use of  iterative reconstruction technique. CONTRAST:  118mL OMNIPAQUE IOHEXOL 300 MG/ML  SOLN COMPARISON:  None. FINDINGS: Lower chest: Small pericardial effusion is seen. Hepatobiliary: There is fatty infiltration in the liver. No focal abnormality is seen. Gallbladder is unremarkable. Pancreas: No focal abnormality is seen. Spleen: Unremarkable. Adrenals/Urinary Tract: Adrenals are unremarkable. There is no hydronephrosis. Ureters not dilated. In the image 36 of series 2, there is questionable 2 mm calcific density in the central portion of the right kidney, possibly vascular calcification. Urinary bladder is unremarkable. GI tract: Small hiatal hernia is seen. Stomach is moderately distended. Small bowel loops are not dilated. Appendix is not dilated. There is no significant wall thickening in colon. There is no pericolic stranding. Vascular/Lymphatic: There are scattered arterial calcifications. Reproductive: Unremarkable. Other: There is no ascites or pneumoperitoneum. Small right inguinal hernia containing fat is seen. Musculoskeletal: Degenerative changes are noted in the lumbar spine, particularly at L5-S1 level with disc space narrowing and encroachment of neural foramina. IMPRESSION: There is no evidence of intestinal obstruction or pneumoperitoneum. Appendix is not dilated. There is no hydronephrosis. Fatty liver. Small hiatal hernia. There is 2 mm questionable calcific density in the midportion of right kidney, possibly calcification in the renal artery branch. Other findings as described in the body of the report. Electronically Signed   By: Elmer Picker M.D.   On: 12/23/2021 15:56   DG Chest Portable 1 View  Result Date: 12/23/2021 CLINICAL DATA:  Sepsis.  Evaluate for pneumonia. EXAM: PORTABLE CHEST 1 VIEW COMPARISON:  10/10/2019 FINDINGS: There again mildly decreased lung volumes. Cardiac silhouette and mediastinal contours are within normal limits with mild calcification seen within the aortic  arch. The lungs are clear. No pleural effusion or pneumothorax. Moderate multilevel degenerative disc changes of the thoracic spine. IMPRESSION: No active disease. Electronically Signed   By: Yvonne Kendall M.D.   On: 12/23/2021 13:52   DG Foot Complete Right  Result Date: 12/23/2021 CLINICAL DATA:  Pneumonia.  Sepsis. EXAM: RIGHT FOOT COMPLETE - 3+ VIEW COMPARISON:  None. FINDINGS: The bones are diffusely osteopenic. No evidence fracture or dislocation. No finding to indicate osteomyelitis. Chronic degenerative change of the MTP joint of the great toe. Regional arterial calcification is noted. IMPRESSION: Diffuse osteopenia. No finding that would allow diagnosis of focal osteomyelitis. Electronically Signed   By: Nelson Chimes M.D.   On: 12/23/2021 13:54    EKG: Independently reviewed.  Sinus tachycardia, rate 110   Labs on Admission: I have personally reviewed the available labs and imaging studies at the time of the admission.  Pertinent labs on Admission: pH 7.0, sodium 133, potassium 5.6, bicarb 8, anion gap 23, blood glucose 179, creatinine 0.8, beta hydroxybutyrate greater than 8, WBC 18   Assessment/Plan:   Euglycemic DKA: Start DKA protocol.  Continue insulin drip.  Point-of-care blood glucose every 1 hour to titrate insulin drip.  BMP every 4 hours to monitor anion gap and serum potassium level.  Start D5 1/2 normal saline.  Hold home [place].  Serial beta hydroxybutyrate's.  Right great toe cellulitis: Nonpurulent.  Start Ancef.  Sepsis: Secondary to above.  Plan as above  High anion gap metabolic acidosis: Plan as above  Hyperkalemia: Potassium 5.6.  Lokelma 10 g x 1 dose.  Essential hypertension: Continue home Norvasc  Diabetic neuropathy: Continue home gabapentin  Chronic constipation: Continue home Linzess  GERD: Continue home Prilosec  Hyperlipidemia: Continue home atorvastatin   Other information:    Level of Care: Stepdown DVT prophylaxis: Lovenox subcu Code  Status: Full code Consults: None Admission status: Inpatient   Leslee Home DO [facility]   How to contact the [facility] Attending or Consulting provider [place] or covering provider during after hours [place], for this patient?  Check the care team in [facility] and look for a) attending/consulting [facility] provider listed and b) the [facility] team listed Log into [email or web address] and use [place]'s universal password to access. If you do not have the password, please contact the hospital operator. Locate the [facility] provider you are looking for under [facility] and page to a number that you can be directly reached. If you still have difficulty reaching the provider, please page the [facility] (Director on Call) for the Hospitalists listed on amion for assistance.   12/23/2021, 6:37 PM

## 2021-12-23 NOTE — Progress Notes (Signed)
PHARMACY -  BRIEF ANTIBIOTIC NOTE  ? ?Pharmacy has received consult(s) for vancomycin and cefepime from an ED provider.  The patient's profile has been reviewed for ht/wt/allergies/indication/available labs.   ? ?One time order(s) placed for: ?Cefepime 2 g IV ?Vancomycin 1500 mg IV ? ?Further antibiotics/pharmacy consults should be ordered by admitting physician if indicated.       ?                ?Thank you, ?Abreanna Drawdy O Sheva Mcdougle ?12/23/2021  2:17 PM ? ?

## 2021-12-23 NOTE — ED Provider Notes (Signed)
[facility] Provider Note    Event Date/Time   First MD Initiated Contact with Patient 12/23/21 1252     (approximate)   History   Code Sepsis   HPI  Michael Montgomery is a 61 y.o. male with a history of COPD presents to the ER due to concern for wound and sepsis from right foot.  Reportedly is having some blistering and drainage from that area the past few days.  Patient reports that he had 1 week of abdominal pain nausea and vomiting but denies any abdominal pain at this time.  He is not currently on any antibiotics.  No diarrhea.  Eyes any back pain or chest pain.  No shortness of breath or cough.     Physical Exam   Triage Vital Signs: ED Triage Vitals  Enc Vitals Group     BP --      Pulse Rate 12/23/21 1250 (!) 118     Resp 12/23/21 1250 (!) 24     Temp 12/23/21 1250 97.8 F (36.6 C)     Temp Source 12/23/21 1250 Oral     SpO2 12/23/21 1250 97 %     Weight 12/23/21 1248 158 lb (71.7 kg)     Height 12/23/21 1248 5\' 11"  (1.803 m)     Head Circumference --      Peak Flow --      Pain Score 12/23/21 1247 3     Pain Loc --      Pain Edu? --      Excl. in [place]? --     Most recent vital signs: Vitals:   12/23/21 1250  Pulse: (!) 118  Resp: (!) 24  Temp: 97.8 F (36.6 C)  SpO2: 97%     Constitutional: Alert  Eyes: Conjunctivae are normal.  Head: Atraumatic. Nose: No congestion/rhinnorhea. Mouth/Throat: Mucous membranes are moist.   Neck: Painless ROM.  Cardiovascular:   Good peripheral circulation. Respiratory: Normal respiratory effort.  No retractions.  Gastrointestinal: Soft and nontender.  Musculoskeletal:  ble edema, right foot warm compared to left.  Area of skin breakdown and erythema of great toe without purulence Neurologic:  MAE spontaneously. No gross focal neurologic deficits are appreciated.  Skin:  Skin is warm, dry and intact. No rash noted. Psychiatric: Mood and affect are normal. Speech and behavior are  normal.    ED Results / Procedures / Treatments   Labs (all labs ordered are listed, but only abnormal results are displayed) Labs Reviewed  COMPREHENSIVE METABOLIC PANEL - Abnormal; Notable for the following components:      Result Value   Sodium 133 (*)    Potassium 5.6 (*)    CO2 8 (*)    Glucose, Bld 179 (*)    Total Protein 9.4 (*)    Alkaline Phosphatase 155 (*)    Total Bilirubin 1.7 (*)    Anion gap 23 (*)    All other components within normal limits  CBC WITH DIFFERENTIAL/PLATELET - Abnormal; Notable for the following components:   WBC 18.9 (*)    Neutro Abs 16.9 (*)    Abs Immature Granulocytes 0.28 (*)    All other components within normal limits  BLOOD GAS, VENOUS - Abnormal; Notable for the following components:   pH, Ven 7.04 (*)    pCO2, Ven 27 (*)    Bicarbonate 7.3 (*)    Acid-base deficit 22.1 (*)    All other components within normal limits  URINALYSIS, COMPLETE (UACMP)  WITH MICROSCOPIC - Abnormal; Notable for the following components:   Color, Urine YELLOW (*)    APPearance HAZY (*)    Glucose, UA >=500 (*)    Hgb urine dipstick MODERATE (*)    Ketones, ur 80 (*)    Protein, ur 100 (*)    All other components within normal limits  BETA-HYDROXYBUTYRIC ACID - Abnormal; Notable for the following components:   Beta-Hydroxybutyric Acid >8.00 (*)    All other components within normal limits  CBG MONITORING, ED - Abnormal; Notable for the following components:   Glucose-Capillary 163 (*)    All other components within normal limits  CULTURE, BLOOD (SINGLE)  RESP PANEL BY RT-PCR (FLU A&B, COVID) [id number]  LACTIC ACID, PLASMA  BASIC METABOLIC PANEL  BASIC METABOLIC PANEL  BASIC METABOLIC PANEL  BASIC METABOLIC PANEL  URINALYSIS, ROUTINE W REFLEX MICROSCOPIC     EKG  ED ECG REPORT I, Merlyn Lot, the attending physician, personally viewed and interpreted this ECG.   Date: 12/23/2021  EKG Time: 16:06  Rate: 115  Rhythm: sinus  Axis:  normal  Intervals:  borderline prolonged qt  ST&T Change: no stemi, no depressions    RADIOLOGY Please see ED Course for my review and interpretation.  I personally reviewed all radiographic images ordered to evaluate for the above acute complaints and reviewed radiology reports and findings.  These findings were personally discussed with the patient.  Please see medical record for radiology report.    PROCEDURES:  Critical Care performed: Yes, see critical care procedure note(s)  .Critical Care Performed by: Merlyn Lot, MD Authorized by: Merlyn Lot, MD   Critical care provider statement:    Critical care time (minutes):  35   Critical care was necessary to treat or prevent imminent or life-threatening deterioration of the following conditions:  Metabolic crisis   Critical care was time spent personally by me on the following activities:  Ordering and performing treatments and interventions, ordering and review of laboratory studies, ordering and review of radiographic studies, pulse oximetry, re-evaluation of patient's condition, review of old charts, obtaining history from patient or surrogate, examination of patient, evaluation of patient's response to treatment, discussions with primary provider, discussions with consultants and development of treatment plan with patient or surrogate .1-3 Lead EKG Interpretation Performed by: Merlyn Lot, MD Authorized by: Merlyn Lot, MD     Interpretation: normal     ECG rate:  110   ECG rate assessment: tachycardic     Rhythm: sinus rhythm     MEDICATIONS ORDERED IN ED: Medications  lactated ringers infusion (0 mLs Intravenous Stopped 12/23/21 1611)  vancomycin (VANCOREADY) IVPB 1500 mg/300 mL (1,500 mg Intravenous New Bag/Given 12/23/21 1556)  insulin regular, human (MYXREDLIN) 100 units/ 100 mL infusion (has no administration in time range)  dextrose 5 % in lactated ringers infusion ( Intravenous New Bag/Given  12/23/21 1610)  dextrose 50 % solution 0-50 mL (has no administration in time range)  sodium chloride 0.9 % bolus 1,000 mL (0 mLs Intravenous Stopped 12/23/21 1529)  ceFEPIme (MAXIPIME) 2 g in sodium chloride 0.9 % 100 mL IVPB (0 g Intravenous Stopped 12/23/21 1529)  metroNIDAZOLE (FLAGYL) IVPB 500 mg (0 mg Intravenous Stopped 12/23/21 1555)  sodium chloride 0.9 % bolus 1,000 mL (0 mLs Intravenous Stopped 12/23/21 1610)  iohexol (OMNIPAQUE) 300 MG/ML solution 100 mL (100 mLs Intravenous Contrast Given 12/23/21 1538)     IMPRESSION / MDM / ASSESSMENT AND PLAN / ED COURSE  I reviewed the triage vital  signs and the nursing notes.                              Differential diagnosis includes, but is not limited to, sepsis, cellulitis, abscess, osteo, uti, pna, dehydration  Patient presenting with symptoms as described above EMS code in for concern for sepsis.  Does have some mild cellulitis of the foot with some skin breakdown as a source of possible infection but not overtly impressive.  Patient quite tachycardic however afebrile here.  Blood work sent for the above differential we will give IV fluids for dehydration.  Patient denying any pain at this time.  Clinical Course as of 12/23/21 1614  Thu Dec 23, 2021  1412 Patient noted to have high anion gap metabolic acidosis lactate still pending we will order broad-spectrum antibiotics we will continue with IV hydration. [PR]  1526 Lactate is negative.  Patient receiving IV antibiotics due to concern for cellulitis and sepsis but on further review patient is on Jardiance this may be euglycemic DKA.  Have added on beta hydroxybutyrate. [PR]  1550 Patient with metabolic acidosis with respiratory incomplete compensation.  Patient started on his second liter. [PR]  1600 My review of CT imaging of the abdomen I do not see acute abnormality.  His beta hydroxybutyric acid is greater than 8 significantly elevated making his presentation most clinically consistent with  euglycemic DKA in the setting of his Jardiance use.  Patient will be started on insulin infusion.  Have ordered D5 LR infusion.  Hospitalist consulted for admission and agreed accept patient to their service. [PR]    Clinical Course User Index [PR] Merlyn Lot, MD     FINAL CLINICAL IMPRESSION(S) / ED DIAGNOSES   Final diagnoses:  Type 1 diabetes mellitus with ketoacidosis without coma ([place])     Rx / DC Orders   ED Discharge Orders     None        Note:  This document was prepared using Dragon voice recognition software and may include unintentional dictation errors.    Merlyn Lot, MD 12/23/21 [phone]

## 2021-12-23 NOTE — Progress Notes (Signed)
Pt arrived to rm ICU 10 at approx 2210, pt A&OX4, no complaints of pain, see MAR for gtts. Call bell in reach, education provided, bed alarm on.  ?

## 2021-12-24 ENCOUNTER — Encounter: Payer: Self-pay | Admitting: Family Medicine

## 2021-12-24 DIAGNOSIS — E1110 Type 2 diabetes mellitus with ketoacidosis without coma: Secondary | ICD-10-CM

## 2021-12-24 LAB — BASIC METABOLIC PANEL
Anion gap: 11 (ref 5–15)
Anion gap: 11 (ref 5–15)
Anion gap: 12 (ref 5–15)
Anion gap: 12 (ref 5–15)
BUN: 10 mg/dL (ref 6–20)
BUN: 11 mg/dL (ref 6–20)
BUN: 13 mg/dL (ref 6–20)
BUN: 6 mg/dL (ref 6–20)
CO2: 12 mmol/L — ABNORMAL LOW (ref 22–32)
CO2: 14 mmol/L — ABNORMAL LOW (ref 22–32)
CO2: 15 mmol/L — ABNORMAL LOW (ref 22–32)
CO2: 16 mmol/L — ABNORMAL LOW (ref 22–32)
Calcium: 8.5 mg/dL — ABNORMAL LOW (ref 8.9–10.3)
Calcium: 8.6 mg/dL — ABNORMAL LOW (ref 8.9–10.3)
Calcium: 8.6 mg/dL — ABNORMAL LOW (ref 8.9–10.3)
Calcium: 9 mg/dL (ref 8.9–10.3)
Chloride: 109 mmol/L (ref 98–111)
Chloride: 110 mmol/L (ref 98–111)
Chloride: 112 mmol/L — ABNORMAL HIGH (ref 98–111)
Chloride: 112 mmol/L — ABNORMAL HIGH (ref 98–111)
Creatinine, Ser: 0.44 mg/dL — ABNORMAL LOW (ref 0.61–1.24)
Creatinine, Ser: 0.48 mg/dL — ABNORMAL LOW (ref 0.61–1.24)
Creatinine, Ser: 0.54 mg/dL — ABNORMAL LOW (ref 0.61–1.24)
Creatinine, Ser: 0.64 mg/dL (ref 0.61–1.24)
GFR, Estimated: >60 mL/min (ref >60)
GFR, Estimated: >60 mL/min (ref >60)
GFR, Estimated: >60 mL/min (ref >60)
GFR, Estimated: >60 mL/min (ref >60)
Glucose, Bld: 117 mg/dL — ABNORMAL HIGH (ref 70–99)
Glucose, Bld: 122 mg/dL — ABNORMAL HIGH (ref 70–99)
Glucose, Bld: 141 mg/dL — ABNORMAL HIGH (ref 70–99)
Glucose, Bld: 165 mg/dL — ABNORMAL HIGH (ref 70–99)
Potassium: 3.5 mmol/L (ref 3.5–5.1)
Potassium: 3.5 mmol/L (ref 3.5–5.1)
Potassium: 3.7 mmol/L (ref 3.5–5.1)
Potassium: 4.4 mmol/L (ref 3.5–5.1)
Sodium: 135 mmol/L (ref 135–145)
Sodium: 136 mmol/L (ref 135–145)
Sodium: 137 mmol/L (ref 135–145)
Sodium: 138 mmol/L (ref 135–145)

## 2021-12-24 LAB — CBC
HCT: 36.9 % — ABNORMAL LOW (ref 39.0–52.0)
Hemoglobin: 12.1 g/dL — ABNORMAL LOW (ref 13.0–17.0)
MCH: 29.2 pg (ref 26.0–34.0)
MCHC: 32.8 g/dL (ref 30.0–36.0)
MCV: 89.1 fL (ref 80.0–100.0)
Platelets: 216 10*3/uL (ref 150–400)
RBC: 4.14 MIL/uL — ABNORMAL LOW (ref 4.22–5.81)
RDW: 15.4 % (ref 11.5–15.5)
WBC: 10.3 10*3/uL (ref 4.0–10.5)
nRBC: 0 % (ref 0.0–0.2)

## 2021-12-24 LAB — BLOOD GAS, VENOUS
Acid-base deficit: 14.6 mmol/L — ABNORMAL HIGH (ref 0.0–2.0)
Bicarbonate: 12.7 mmol/L — ABNORMAL LOW (ref 20.0–28.0)
O2 Saturation: 91.8 %
Patient temperature: 37
pCO2, Ven: 34 mmHg — ABNORMAL LOW (ref 44–60)
pH, Ven: 7.18 — CL (ref 7.25–7.43)
pO2, Ven: 65 mmHg — ABNORMAL HIGH (ref 32–45)

## 2021-12-24 LAB — MAGNESIUM: Magnesium: 1.8 mg/dL (ref 1.7–2.4)

## 2021-12-24 LAB — GLUCOSE, CAPILLARY
Glucose-Capillary: 113 mg/dL — ABNORMAL HIGH (ref 70–99)
Glucose-Capillary: 114 mg/dL — ABNORMAL HIGH (ref 70–99)
Glucose-Capillary: 125 mg/dL — ABNORMAL HIGH (ref 70–99)
Glucose-Capillary: 128 mg/dL — ABNORMAL HIGH (ref 70–99)
Glucose-Capillary: 128 mg/dL — ABNORMAL HIGH (ref 70–99)
Glucose-Capillary: 131 mg/dL — ABNORMAL HIGH (ref 70–99)
Glucose-Capillary: 135 mg/dL — ABNORMAL HIGH (ref 70–99)
Glucose-Capillary: 136 mg/dL — ABNORMAL HIGH (ref 70–99)
Glucose-Capillary: 138 mg/dL — ABNORMAL HIGH (ref 70–99)
Glucose-Capillary: 138 mg/dL — ABNORMAL HIGH (ref 70–99)
Glucose-Capillary: 139 mg/dL — ABNORMAL HIGH (ref 70–99)
Glucose-Capillary: 140 mg/dL — ABNORMAL HIGH (ref 70–99)
Glucose-Capillary: 149 mg/dL — ABNORMAL HIGH (ref 70–99)

## 2021-12-24 LAB — BETA-HYDROXYBUTYRIC ACID
Beta-Hydroxybutyric Acid: 3.16 mmol/L — ABNORMAL HIGH (ref 0.05–0.27)
Beta-Hydroxybutyric Acid: 2.88 mmol/L — ABNORMAL HIGH (ref 0.05–0.27)

## 2021-12-24 LAB — PHOSPHORUS: Phosphorus: 1.4 mg/dL — ABNORMAL LOW (ref 2.5–4.6)

## 2021-12-24 MED ORDER — K PHOS MONO-SOD PHOS DI & MONO 155-852-130 MG PO TABS
500.0000 mg | ORAL_TABLET | Freq: Three times a day (TID) | ORAL | Status: DC
  Administered 2021-12-24: 500 mg via ORAL
  Filled 2021-12-24 (×3): qty 2

## 2021-12-24 MED ORDER — SODIUM CHLORIDE 0.9 % IV BOLUS
1000.0000 mL | Freq: Once | INTRAVENOUS | Status: AC
  Administered 2021-12-24: 1000 mL via INTRAVENOUS

## 2021-12-24 MED ORDER — INSULIN GLARGINE-YFGN 100 UNIT/ML ~~LOC~~ SOLN: 12.0000 [IU] | Freq: Once | SUBCUTANEOUS | Status: DC

## 2021-12-24 MED ORDER — INSULIN GLARGINE-YFGN 100 UNIT/ML ~~LOC~~ SOLN
10.0000 [IU] | Freq: Once | SUBCUTANEOUS | Status: AC
  Administered 2021-12-24: 10 [IU] via SUBCUTANEOUS
  Filled 2021-12-24: qty 0.1

## 2021-12-24 MED ORDER — CEFAZOLIN SODIUM-DEXTROSE 2-4 GM/100ML-% IV SOLN
2.0000 g | Freq: Three times a day (TID) | INTRAVENOUS | Status: DC
  Administered 2021-12-24 – 2021-12-25 (×3): 2 g via INTRAVENOUS
  Filled 2021-12-24 (×5): qty 100

## 2021-12-24 NOTE — Progress Notes (Signed)
Progress Note    Michael Montgomery  [id number] DOB: [birth year]  DOA: 12/23/2021 PCP: Leone Haven, MD      Brief Narrative:    Medical records reviewed and are as summarized below:  Michael Montgomery is a 61 y.o. male with past medical history of non-insulin-dependent diabetes mellitus type 2 on Jardiance, diabetic neuropathy, ambulatory dysfunction, chronic inflammatory demyelinating polyneuropathy, chronic constipation, essential hypertension, GERD, dyslipidemia.  He presented to the emergency room because of pain in the right big toe, nausea and vomiting.  He hit his toe against a wall after he was trying to get around in his new wheelchair about 3 days prior to admission.Marland Kitchen  He developed abrasions on the big toe and and noted some drainage from the right big toe as well.  He was found to have DKA and sepsis secondary to right big toe cellulitis.  He was treated with IV insulin infusion, IV fluids and empiric IV antibiotics.   Assessment/Plan:   Principal Problem:   DKA (diabetic ketoacidosis) (HCC)   Body mass index is 23.52 kg/m.   Type II DM with DKA: Resolved.  Taper off insulin drip and start Lantus.  Vania Rea has been discontinued.  Check hemoglobin A1c.  Patient said he has taken metformin in the past.  Discussed other hypoglycemic agents that he could potentially use going forward.  Sepsis secondary to right big toe cellulitis: Treated with IV Ancef.  Hyperkalemia: Resolved.  Check magnesium and phosphorus levels.  Other comorbidities include hypertension, diabetic neuropathy, chronic constipation, lower extremity paraparesis from chronic inflammatory demyelinating polyneuropathy (CIDP)   Transfer from stepdown unit to MedSurg.  Diet Order             Diet Carb Modified Fluid consistency: Thin; Room service appropriate? Yes  Diet effective now                     Consultants: None  Procedures: None    Medications:    amLODipine   10 mg Oral Daily   aspirin EC  81 mg Oral Daily   atorvastatin  40 mg Oral Daily   Chlorhexidine Gluconate Cloth  6 each Topical Q0600   enoxaparin (LOVENOX) injection  40 mg Subcutaneous Q24H   gabapentin  200 mg Oral TID   linaclotide  72 mcg Oral QAC breakfast   multivitamin with minerals  1 tablet Oral Daily   pantoprazole  40 mg Oral Daily   sertraline  100 mg Oral Daily   Continuous Infusions:   ceFAZolin (ANCEF) IV     dextrose 5 % and 0.45% NaCl 125 mL/hr at 12/24/21 1000   insulin 0.7 Units/hr (12/24/21 1103)     Anti-infectives (From admission, onward)    Start     Dose/Rate Route Frequency Ordered Stop   12/24/21 1200  ceFAZolin (ANCEF) IVPB 2g/100 mL premix        2 g 200 mL/hr over 30 Minutes Intravenous Every 8 hours 12/24/21 1059     12/23/21 1430  vancomycin (VANCOREADY) IVPB 1500 mg/300 mL        1,500 mg 150 mL/hr over 120 Minutes Intravenous  Once 12/23/21 1417 12/23/21 1823   12/23/21 1415  ceFEPIme (MAXIPIME) 2 g in sodium chloride 0.9 % 100 mL IVPB        2 g 200 mL/hr over 30 Minutes Intravenous  Once 12/23/21 1411 12/23/21 1529   12/23/21 1415  metroNIDAZOLE (FLAGYL) IVPB 500 mg  500 mg 100 mL/hr over 60 Minutes Intravenous  Once 12/23/21 1411 12/23/21 1555   12/23/21 1415  vancomycin (VANCOCIN) IVPB 1000 mg/200 mL premix  Status:  Discontinued        1,000 mg 200 mL/hr over 60 Minutes Intravenous  Once 12/23/21 1411 12/23/21 1417              Family Communication/Anticipated D/C date and plan/Code Status   DVT prophylaxis: enoxaparin (LOVENOX) injection 40 mg Start: 12/23/21 2200     Code Status: Full Code  Family Communication: Plan discussed with his wife over the phone Disposition Plan: Plan to discharge home tomorrow   Status is: Inpatient Remains inpatient appropriate because: DKA               Subjective:   Interval events noted.  No nausea, vomiting or abdominal pain.  He has some discomfort in the right  big toe.  Objective:    Vitals:   12/24/21 0839 12/24/21 0900 12/24/21 1000 12/24/21 1100  BP:  115/61 130/66 (!) 148/59  Pulse: 97 98 99 100  Resp:      Temp:      TempSrc:      SpO2: 98% 97% 96% 98%  Weight:      Height:       No data found.   Intake/Output Summary (Last 24 hours) at 12/24/2021 1159 Last data filed at 12/24/2021 1000 Gross per 24 hour  Intake 5855.5 ml  Output 1000 ml  Net 4855.5 ml   Filed Weights   12/23/21 1248 12/23/21 2210  Weight: 71.7 kg 76.5 kg    Exam:  GEN: NAD SKIN: No rash.  Abrasion, mild swelling and tenderness on the right big toe EYES: EOMI ENT: MMM CV: RRR PULM: CTA B ABD: soft, ND, NT, +BS CNS: AAO x 3, paraparesis with power 2/5 in b/l lower extremities, b/l foot drop EXT: No edema or tenderness        Data Reviewed:   I have personally reviewed following labs and imaging studies:  Labs: Labs show the following:   Basic Metabolic Panel: Recent Labs  Lab 12/23/21 1307 12/23/21 1928 12/24/21 0004 12/24/21 0457 12/24/21 0743  NA 133* 139 136 138 137  K 5.6* 4.3 4.4 3.7 3.5  CL 102 111 112* 110 112*  CO2 8* 12* 12* 16* 14*  GLUCOSE 179* 120* 117* 122* 141*  BUN 18 16 13 11 10   CREATININE 0.85 0.79 0.64 0.48* 0.44*  CALCIUM 9.5 8.9 8.6* 9.0 8.5*  MG  --  2.0  --   --   --    GFR Estimated Creatinine Clearance: 107.2 mL/min (A) (by C-G formula based on SCr of 0.44 mg/dL (L)). Liver Function Tests: Recent Labs  Lab 12/23/21 1307  AST 37  ALT 28  ALKPHOS 155*  BILITOT 1.7*  PROT 9.4*  ALBUMIN 4.3   No results for input(s): LIPASE, AMYLASE in the last 168 hours. No results for input(s): AMMONIA in the last 168 hours. Coagulation profile No results for input(s): INR, PROTIME in the last 168 hours.  CBC: Recent Labs  Lab 12/23/21 1307 12/24/21 0457  WBC 18.9* 10.3  NEUTROABS 16.9*  --   HGB 14.2 12.1*  HCT 45.6 36.9*  MCV 93.8 89.1  PLT 269 216   Cardiac Enzymes: No results for input(s):  CKTOTAL, CKMB, CKMBINDEX, TROPONINI in the last 168 hours. BNP (last 3 results) No results for input(s): PROBNP in the last 8760 hours. CBG: Recent Labs  Lab 12/24/21 0624 12/24/21 0734 12/24/21 0901 12/24/21 1003 12/24/21 1102  GLUCAP 135* 136* 140* 138* 149*   D-Dimer: No results for input(s): DDIMER in the last 72 hours. Hgb A1c: No results for input(s): HGBA1C in the last 72 hours. Lipid Profile: No results for input(s): CHOL, HDL, LDLCALC, TRIG, CHOLHDL, LDLDIRECT in the last 72 hours. Thyroid function studies: No results for input(s): TSH, T4TOTAL, T3FREE, THYROIDAB in the last 72 hours.  Invalid input(s): FREET3 Anemia work up: No results for input(s): VITAMINB12, FOLATE, FERRITIN, TIBC, IRON, RETICCTPCT in the last 72 hours. Sepsis Labs: Recent Labs  Lab 12/23/21 1307 12/24/21 0457  WBC 18.9* 10.3  LATICACIDVEN 1.4  --     Microbiology Recent Results (from the past 240 hour(s))  Culture, blood (single)     Status: None (Preliminary result)   Collection Time: 12/23/21 12:55 PM   Specimen: BLOOD  Result Value Ref Range Status   Specimen Description BLOOD BLOOD LEFT HAND  Final   Special Requests   Final    BOTTLES DRAWN AEROBIC AND ANAEROBIC Blood Culture adequate volume   Culture   Final    NO GROWTH < 24 HOURS Performed at [facility], [address] [place], [place] [address]    Report Status PENDING  Incomplete  Resp Panel by RT-PCR (Flu A&B, Covid) Nasopharyngeal Swab     Status: None   Collection Time: 12/23/21  3:28 PM   Specimen: Nasopharyngeal Swab; Nasopharyngeal(NP) swabs in vial transport medium  Result Value Ref Range Status   SARS Coronavirus 2 by RT PCR NEGATIVE NEGATIVE Final    Comment: (NOTE) SARS-CoV-2 target nucleic acids are NOT DETECTED.  The SARS-CoV-2 RNA is generally detectable in upper respiratory specimens during the acute phase of infection. The lowest concentration of SARS-CoV-2 viral copies this assay can detect  is 138 copies/mL. A negative result does not preclude SARS-Cov-2 infection and should not be used as the sole basis for treatment or other patient management decisions. A negative result may occur with  improper specimen collection/handling, submission of specimen other than nasopharyngeal swab, presence of viral mutation(s) within the areas targeted by this assay, and inadequate number of viral copies(<138 copies/mL). A negative result must be combined with clinical observations, patient history, and epidemiological information. The expected result is Negative.  Fact Sheet for Patients:  EntrepreneurPulse.com.au  Fact Sheet for Healthcare Providers:  IncredibleEmployment.be  This test is no t yet approved or cleared by the [place] FDA and  has been authorized for detection and/or diagnosis of SARS-CoV-2 by FDA under an Emergency Use Authorization (EUA). This EUA will remain  in effect (meaning this test can be used) for the duration of the COVID-19 declaration under Section 564(b)(1) of the Act, 21 U.S.C.section 360bbb-3(b)(1), unless the authorization is terminated  or revoked sooner.       Influenza A by PCR NEGATIVE NEGATIVE Final   Influenza B by PCR NEGATIVE NEGATIVE Final    Comment: (NOTE) The Xpert Xpress SARS-CoV-2/FLU/RSV plus assay is intended as an aid in the diagnosis of influenza from Nasopharyngeal swab specimens and should not be used as a sole basis for treatment. Nasal washings and aspirates are unacceptable for Xpert Xpress SARS-CoV-2/FLU/RSV testing.  Fact Sheet for Patients: EntrepreneurPulse.com.au  Fact Sheet for Healthcare Providers: IncredibleEmployment.be  This test is not yet approved or cleared by the [place] FDA and has been authorized for detection and/or diagnosis of SARS-CoV-2 by FDA under an Emergency Use Authorization (EUA). This EUA will remain  in effect  (meaning this test can be used) for the duration of the COVID-19 declaration under Section 564(b)(1) of the Act, 21 U.S.C. section 360bbb-3(b)(1), unless the authorization is terminated or revoked.  Performed at [facility], [address] [place], [place] [address]   MRSA Next Gen by PCR, Nasal     Status: None   Collection Time: 12/23/21 10:05 PM   Specimen: Nasal Mucosa; Nasal Swab  Result Value Ref Range Status   MRSA by PCR Next Gen NOT DETECTED NOT DETECTED Final    Comment: (NOTE) The GeneXpert MRSA Assay (FDA approved for NASAL specimens only), is one component of a comprehensive MRSA colonization surveillance program. It is not intended to diagnose MRSA infection nor to guide or monitor treatment for MRSA infections. Test performance is not FDA approved in patients less than 75 years old. Performed at [facility], [address] [place], [place] [address]     Procedures and diagnostic studies:  CT ABDOMEN PELVIS W CONTRAST  Result Date: 12/23/2021 CLINICAL DATA:  Sepsis EXAM: CT ABDOMEN AND PELVIS WITH CONTRAST TECHNIQUE: Multidetector CT imaging of the abdomen and pelvis was performed using the standard protocol following bolus administration of intravenous contrast. RADIATION DOSE REDUCTION: This exam was performed according to the departmental dose-optimization program which includes automated exposure control, adjustment of the mA and/or kV according to patient size and/or use of iterative reconstruction technique. CONTRAST:  138mL OMNIPAQUE IOHEXOL 300 MG/ML  SOLN COMPARISON:  None. FINDINGS: Lower chest: Small pericardial effusion is seen. Hepatobiliary: There is fatty infiltration in the liver. No focal abnormality is seen. Gallbladder is unremarkable. Pancreas: No focal abnormality is seen. Spleen: Unremarkable. Adrenals/Urinary Tract: Adrenals are unremarkable. There is no hydronephrosis. Ureters not dilated. In the image 36 of series 2, there is  questionable 2 mm calcific density in the central portion of the right kidney, possibly vascular calcification. Urinary bladder is unremarkable. GI tract: Small hiatal hernia is seen. Stomach is moderately distended. Small bowel loops are not dilated. Appendix is not dilated. There is no significant wall thickening in colon. There is no pericolic stranding. Vascular/Lymphatic: There are scattered arterial calcifications. Reproductive: Unremarkable. Other: There is no ascites or pneumoperitoneum. Small right inguinal hernia containing fat is seen. Musculoskeletal: Degenerative changes are noted in the lumbar spine, particularly at L5-S1 level with disc space narrowing and encroachment of neural foramina. IMPRESSION: There is no evidence of intestinal obstruction or pneumoperitoneum. Appendix is not dilated. There is no hydronephrosis. Fatty liver. Small hiatal hernia. There is 2 mm questionable calcific density in the midportion of right kidney, possibly calcification in the renal artery branch. Other findings as described in the body of the report. Electronically Signed   By: Elmer Picker M.D.   On: 12/23/2021 15:56   DG Chest Portable 1 View  Result Date: 12/23/2021 CLINICAL DATA:  Sepsis.  Evaluate for pneumonia. EXAM: PORTABLE CHEST 1 VIEW COMPARISON:  10/10/2019 FINDINGS: There again mildly decreased lung volumes. Cardiac silhouette and mediastinal contours are within normal limits with mild calcification seen within the aortic arch. The lungs are clear. No pleural effusion or pneumothorax. Moderate multilevel degenerative disc changes of the thoracic spine. IMPRESSION: No active disease. Electronically Signed   By: Yvonne Kendall M.D.   On: 12/23/2021 13:52   DG Foot Complete Right  Result Date: 12/23/2021 CLINICAL DATA:  Pneumonia.  Sepsis. EXAM: RIGHT FOOT COMPLETE - 3+ VIEW COMPARISON:  None. FINDINGS: The bones are diffusely osteopenic. No evidence fracture or dislocation. No  finding to indicate  osteomyelitis. Chronic degenerative change of the MTP joint of the great toe. Regional arterial calcification is noted. IMPRESSION: Diffuse osteopenia. No finding that would allow diagnosis of focal osteomyelitis. Electronically Signed   By: Nelson Chimes M.D.   On: 12/23/2021 13:54               LOS: 1 day   Audreyanna Butkiewicz  [facility]   Pager on [email or web address]. If 7PM-7AM, please contact night-coverage at [email or web address]     12/24/2021, 11:59 AM

## 2021-12-24 NOTE — Consult Note (Signed)
WOC Nurse Consult Note: ?Patient receiving care in [place]. ?Reason for Consult: "Right Great Toe abasion" ?Wound type: no actual wound observed, only dried dark exudate. Very likely resulting from an abrasion. The patient has bilateral foot drop and toes on both feet point downward. ?Pressure Injury POA: Yes/No/NA ?Measurement: ?Wound bed: ?Drainage (amount, consistency, odor)  ?Periwound: intact, without edema or erythema. ?Dressing procedure/placement/frequency: ?Apply iodine from the swabsticks or swab pads from clean utility to right great toe, and the space between this toe and the second.  Allow to air dry. ? ?I have also entered an order to float the heels. ?Thank you for the consult.  Discussed plan of care with the patient.  [place] nurse will not follow at this time.  Please re-consult the [place] team if needed. ? ?Val Riles, RN, MSN, CWOCN, CNS-BC, pager [phone]  ?  ?

## 2021-12-24 NOTE — Progress Notes (Signed)
Cross Cover ?Vbg and chem panel showing persistent metabolic acidosis despite anion gap of 12.  Continue d51/2 NS, give additional liter of NS and continue endotool, insulin infusion, for now ?

## 2021-12-24 NOTE — Plan of Care (Signed)
  Problem: Education: Goal: Knowledge of General Education information will improve Description: Including pain rating scale, medication(s)/side effects and non-pharmacologic comfort measures Outcome: Progressing   Problem: Activity: Goal: Risk for activity intolerance will decrease Outcome: Progressing   Problem: Nutrition: Goal: Adequate nutrition will be maintained Outcome: Progressing   Problem: Coping: Goal: Level of anxiety will decrease Outcome: Progressing   

## 2021-12-24 NOTE — Progress Notes (Addendum)
Inpatient Diabetes Program Recommendations ? ?AACE/ADA: New Consensus Statement on Inpatient Glycemic Control (2015) ? ?Target Ranges:  Prepandial:   less than 140 mg/dL ?     Peak postprandial:   less than 180 mg/dL (1-2 hours) ?     Critically ill patients:  140 - 180 mg/dL  ? ? Latest Reference Range & Units 12/23/21 13:07  ?Sodium 135 - 145 mmol/L 133 (L)  ?Potassium 3.5 - 5.1 mmol/L 5.6 (H)  ?Chloride 98 - 111 mmol/L 102  ?CO2 22 - 32 mmol/L 8 (L)  ?Glucose 70 - 99 mg/dL 179 (H)  ?BUN 6 - 20 mg/dL 18  ?Creatinine 0.61 - 1.24 mg/dL 0.85  ?Calcium 8.9 - 10.3 mg/dL 9.5  ?Anion gap 5 - 15  23 (H)  ? ? Latest Reference Range & Units 12/23/21 13:07 12/23/21 19:28 12/24/21 04:57  ?Beta-Hydroxybutyric Acid 0.05 - 0.27 mmol/L >8.00 (H) 5.66 (H) 3.16 (H)  ?(H): Data is abnormally high ? Latest Reference Range & Units 12/23/21 23:16 12/24/21 00:19 12/24/21 01:22 12/24/21 02:41 12/24/21 03:29 12/24/21 04:25 12/24/21 05:23 12/24/21 06:24 12/24/21 07:34  ?Glucose-Capillary 70 - 99 mg/dL 108 (H) ? ?IV Insulin Drip Started _0  on 03/02 114 (H) 131 (H) 139 (H) 138 (H) 128 (H) 113 (H) 135 (H) 136 (H)  ?(H): Data is abnormally high ? ? ?Admit with:  ?Euglycemic DKA ?Cellulitis of the right great toe ?Sepsis ? ?History: DM2 ? ?Home DM Meds: Jardiance 10 mg daily ? ?Current Orders: IV Insulin Drip ? ? ?MD- Note 4:57am BMET shows Anion Gap 12 but CO2 level still low at 16. ? ?Suspect Euglycemic DKA may have been precipitated by the Jardiance pt takes at home ? ?Please leave pt on the IV Insulin Drip until CO2 level at least 20 or higher ? ?May consider stopping Jardiance from home meds and switching pt to another oral DM medication at time of d/c home--Perhaps we could switch pt to DPP-4 inhibitor like Januvia or Tradjenta once daily? ? ? ? ?Addendum 12pm--Met w/ pt at bedside in the ICU.  Pt A&O and able to hold meaningful conversation.  Discussed with patient diagnosis of Euglycemic DKA (pathophysiology), treatment of DKA, lab  results, and transition plan to SQ insulin regimen.  Discussed with pt most recent A1c of 5.9% (June 2022).  Also discussed with pt that the Jardiance may have caused the DKA and that we may need to seek alternative oral DM meds to send pt home on.  Pt acknowledged info and agreeable.  Strongly encouraged pt to make follow up appt with PCP after discharge. ? ? ? ? ?--Will follow patient during hospitalization-- ? ?Wyn Quaker RN, MSN, CDE ?Diabetes Coordinator ?Inpatient Glycemic Control Team ?Team Pager: [phone] (8a-5p) ? ?

## 2021-12-24 NOTE — TOC Initial Note (Signed)
Transition of Care (TOC) - Initial/Assessment Note  ? ? ?Patient Details  ?Name: Michael Montgomery ?MRN: [id number] ?Date of Birth: [birth year] ? ?Transition of Care (TOC) CM/SW Contact:    ?Shelbie Hutching, RN ?Phone Number: ?12/24/2021, 4:41 PM ? ?Clinical Narrative:                 ?Patient admitted to the hospital with DKA and foot wounds.  RNCM met with patient at the bedside, introduced self and explained role.  Patient is from home where he lives with his wife and 2 adult children.  Patient is wheelchair bound at baseline.  He has PCS aide that comes out 7 days per week for 2 1/2 hours at the time, they help him get up and dressed and bathed.  In the evening his son gets off work and comes home around 8 pm and helps him get to bed.  Patient has an IT trainer wheelchair at home.  He would like home health services, RN and PT to come out at discharge.  Floydene Flock with [facility] accepted referral, their start of care would not be until 3/8.   ?PCP is Dr. Caryl Bis.  ? ?TOC will cont to follow.   ? ?Expected Discharge Plan: [place] ?Barriers to Discharge: Continued Medical Work up ? ? ?Patient Goals and CMS Choice ?Patient states their goals for this hospitalization and ongoing recovery are:: to get back  home ?CMS Medicare.gov Compare Post Acute Care list provided to:: Patient ?Choice offered to / list presented to : Patient ? ?Expected Discharge Plan and Services ?Expected Discharge Plan: [place] ?  ?Discharge Planning Services: CM Consult ?Post Acute Care Choice: Home Health ?Living arrangements for the past 2 months: [place] ?                ?DME Arranged: N/A ?  ?  ?  ?  ?HH Arranged: Therapist, sports, PT ?[place] Agency: [place] ([place]) ?Date HH Agency Contacted: 12/24/21 ?Time [place]: 9191 ?Representative spoke with at [place]: Corene Cornea ? ?Prior Living Arrangements/Services ?Living arrangements for the past 2 months: [place] ?Lives with::  Adult Children, Spouse ?Patient language and need for interpreter reviewed:: Yes ?Do you feel safe going back to the place where you live?: Yes      ?Need for Family Participation in Patient Care: Yes (Comment) ?Care giver support system in place?: Yes (comment) (wife and adult children) ?Current home services: DME (electric wheelchair, bedside commode) ?Criminal Activity/Legal Involvement Pertinent to Current Situation/Hospitalization: No - Comment as needed ? ?Activities of Daily Living ?Home Assistive Devices/Equipment: Wheelchair ?ADL Screening (condition at time of admission) ?Patient's cognitive ability adequate to safely complete daily activities?: Yes ?Is the patient deaf or have difficulty hearing?: No ?Does the patient have difficulty seeing, even when wearing glasses/contacts?: No ?Does the patient have difficulty concentrating, remembering, or making decisions?: No ?Patient able to express need for assistance with ADLs?: Yes ?Does the patient have difficulty dressing or bathing?: Yes ?Independently performs ADLs?: No ?Communication: Independent ?Dressing (OT): Needs assistance ?Is this a change from baseline?: Pre-admission baseline ?Grooming: Needs assistance ?Is this a change from baseline?: Pre-admission baseline ?Feeding: Independent ?Is this a change from baseline?: Pre-admission baseline ?Bathing: Needs assistance, Independent ?Is this a change from baseline?: Pre-admission baseline ?Toileting: Needs assistance ?Is this a change from baseline?: Pre-admission baseline ?In/Out Bed: Needs assistance ?Is this a change from baseline?: Pre-admission baseline ?Walks in Home: Needs assistance ?  Is this a change from baseline?: Pre-admission baseline ?Does the patient have difficulty walking or climbing stairs?: Yes ?Weakness of Legs: Both ?Weakness of Arms/Hands: None ? ?Permission Sought/Granted ?Permission sought to share information with : Case Manager, Family Supports ?Permission granted to share  information with : Yes, Verbal Permission Granted ? Share Information with NAME: Geni Bers Devivo ? Permission granted to share info w AGENCY: home health ? Permission granted to share info w Relationship: wife ? Permission granted to share info w Contact Information: [phone] ? ?Emotional Assessment ?Appearance:: Appears stated age ?Attitude/Demeanor/Rapport: Engaged ?Affect (typically observed): Accepting ?Orientation: : Oriented to Self, Oriented to Place, Oriented to  Time, Oriented to Situation ?Alcohol / Substance Use: Not Applicable ?Psych Involvement: No (comment) ? ?Admission diagnosis:  DKA (diabetic ketoacidosis) ([place]) [E11.10] ?Type 1 diabetes mellitus with ketoacidosis without coma ([place]) [E10.10] ?Patient Active Problem List  ? Diagnosis Date Noted  ? DKA (diabetic ketoacidosis) ([place]) 12/23/2021  ? Lightheadedness 02/11/2020  ? Weight loss 02/11/2020  ? Diarrhea 01/21/2020  ? Back strain 07/16/2019  ? DOE (dyspnea on exertion) 07/12/2019  ? Fall 07/12/2019  ? Elevated LFTs 02/08/2019  ? Rotator cuff impingement syndrome of left shoulder 02/19/2018  ? Depression, major, recurrent, mild ([place]) 10/20/2017  ? Hypogonadism in male 08/09/2017  ? Cough 05/02/2017  ? Attention deficit 02/14/2017  ? Constipation 01/18/2017  ? Mood swings 09/07/2016  ? Allergic rhinitis 09/07/2016  ? Poor dentition 09/07/2016  ? Hyperlipidemia 05/24/2016  ? Sleeping difficulty 04/21/2016  ? CIDP (chronic inflammatory demyelinating polyneuropathy) ([place]) 04/11/2016  ? Erectile dysfunction of organic origin 03/17/2016  ? Phimosis 03/17/2016  ? BPH with obstruction/lower urinary tract symptoms 03/17/2016  ? Gingivitis 03/01/2016  ? Diabetes ([place]) 02/07/2016  ? Erectile dysfunction 02/07/2016  ? Muscle wasting and atrophy, not elsewhere classified, unspecified lower leg 01/21/2016  ? Essential hypertension 01/19/2016  ? Cataracts, bilateral 01/19/2016  ? Left knee pain 01/19/2016  ? ?PCP:  Leone Haven, MD ?Pharmacy:    ?[id number] [place] [place] - [address] ?[address] ?[place]?Phone: [phone]: [phone]? ?[id number] [address] [facility] ?[address] ?[place]?Phone: [phone]: [phone]? ?[facility] - [place] ?[place]?Phone: [phone]: [phone]? ?[id number] [place] [address]Phone: [phone]: [phone] ? ? ? ?Social Determinants of Health (SDOH) Interventions ?  ? ?Readmission Risk Interventions ?No flowsheet data found. ? ? ?

## 2021-12-25 DIAGNOSIS — L03031 Cellulitis of right toe: Secondary | ICD-10-CM | POA: Diagnosis present

## 2021-12-25 DIAGNOSIS — A419 Sepsis, unspecified organism: Secondary | ICD-10-CM | POA: Diagnosis present

## 2021-12-25 LAB — BASIC METABOLIC PANEL
Anion gap: 7 (ref 5–15)
BUN: 6 mg/dL (ref 6–20)
CO2: 19 mmol/L — ABNORMAL LOW (ref 22–32)
Calcium: 8.1 mg/dL — ABNORMAL LOW (ref 8.9–10.3)
Chloride: 113 mmol/L — ABNORMAL HIGH (ref 98–111)
Creatinine, Ser: 0.38 mg/dL — ABNORMAL LOW (ref 0.61–1.24)
GFR, Estimated: >60 mL/min (ref >60)
Glucose, Bld: 118 mg/dL — ABNORMAL HIGH (ref 70–99)
Potassium: 3.2 mmol/L — ABNORMAL LOW (ref 3.5–5.1)
Sodium: 139 mmol/L (ref 135–145)

## 2021-12-25 LAB — MAGNESIUM: Magnesium: 2.1 mg/dL (ref 1.7–2.4)

## 2021-12-25 LAB — GLUCOSE, CAPILLARY
Glucose-Capillary: 122 mg/dL — ABNORMAL HIGH (ref 70–99)
Glucose-Capillary: 137 mg/dL — ABNORMAL HIGH (ref 70–99)

## 2021-12-25 LAB — BETA-HYDROXYBUTYRIC ACID: Beta-Hydroxybutyric Acid: 1.74 mmol/L — ABNORMAL HIGH (ref 0.05–0.27)

## 2021-12-25 LAB — PHOSPHORUS: Phosphorus: 1.5 mg/dL — ABNORMAL LOW (ref 2.5–4.6)

## 2021-12-25 MED ORDER — SITAGLIPTIN PHOSPHATE 25 MG PO TABS: 25.0000 mg | ORAL_TABLET | Freq: Every day | ORAL | 0 refills | Status: DC

## 2021-12-25 MED ORDER — CEFADROXIL 500 MG PO CAPS
500.0000 mg | ORAL_CAPSULE | Freq: Two times a day (BID) | ORAL | 0 refills | Status: AC
Start: 2021-12-25 — End: 2021-12-28

## 2021-12-25 MED ORDER — K PHOS MONO-SOD PHOS DI & MONO 155-852-130 MG PO TABS: 500.0000 mg | ORAL_TABLET | Freq: Two times a day (BID) | ORAL | 0 refills | Status: AC

## 2021-12-25 MED ORDER — POTASSIUM CHLORIDE CRYS ER 20 MEQ PO TBCR
40.0000 meq | EXTENDED_RELEASE_TABLET | Freq: Once | ORAL | Status: AC
  Administered 2021-12-25: 40 meq via ORAL
  Filled 2021-12-25: qty 2

## 2021-12-25 MED ORDER — K PHOS MONO-SOD PHOS DI & MONO 155-852-130 MG PO TABS
500.0000 mg | ORAL_TABLET | Freq: Three times a day (TID) | ORAL | Status: DC
  Administered 2021-12-25: 500 mg via ORAL
  Filled 2021-12-25 (×3): qty 2

## 2021-12-25 NOTE — TOC Transition Note (Addendum)
Transition of Care (TOC) - CM/SW Discharge Note ? ? ?Patient Details  ?Name: DENZELL COLASANTI ?MRN: [id number] ?Date of Birth: 10-07-1961 ? ?Transition of Care (TOC) CM/SW Contact:  ?Jonathin Heinicke E Adyen Bifulco, LCSW ?Phone Number: ?12/25/2021, 12:27 PM ? ? ?Clinical Narrative:   Patient to DC home today. ?Notified Corene Cornea with [facility].  ?Family requesting EMS transport.  ?[facility] arranged, patient is next on list as of 12:30. RN notified.  ? ? ? ?Final next level of care: [place] ?Barriers to Discharge: Barriers Resolved ? ? ?Patient Goals and CMS Choice ?Patient states their goals for this hospitalization and ongoing recovery are:: to get back  home ?CMS Medicare.gov Compare Post Acute Care list provided to:: Patient ?Choice offered to / list presented to : Patient ? ?Discharge Placement ?  ?           ?  ?  ?  ?Patient and family notified of of transfer: 12/25/21 ? ?Discharge Plan and Services ?  ?Discharge Planning Services: CM Consult ?Post Acute Care Choice: Home Health          ?DME Arranged: N/A ?  ?  ?  ?  ?HH Arranged: Therapist, sports, PT ?[place] Agency: [facility] ([place]) ?Date HH Agency Contacted: 12/25/21 ?Time [place]: 9476 ?Representative spoke with at [place]: Corene Cornea ? ?Social Determinants of Health (SDOH) Interventions ?  ? ? ?Readmission Risk Interventions ?No flowsheet data found. ? ? ? ? ?

## 2021-12-25 NOTE — Discharge Summary (Addendum)
Physician Discharge Summary  [name] TMH:[id number] DOB: 1961/04/30 DOA: 12/23/2021  PCP: Leone Haven, MD  Admit date: 12/23/2021 Discharge date: 12/25/2021  Discharge disposition: Home   Recommendations for Outpatient Follow-Up:   Follow-up with PCP in 1 week  Discharge Diagnosis:   Principal Problem:   DKA (diabetic ketoacidosis) ([place]) Active Problems:   Sepsis ([place])   Cellulitis of great toe, right    Discharge Condition: Stable.  Diet recommendation:  Diet Order             Diet - low sodium heart healthy           Diet Carb Modified           Diet Carb Modified Fluid consistency: Thin; Room service appropriate? Yes  Diet effective now                     Code Status: Full Code     Hospital Course:   Mr. Michael Montgomery is a 61 y.o. male with past medical history of non-insulin-dependent diabetes mellitus type 2 on Jardiance, diabetic neuropathy, ambulatory dysfunction, chronic inflammatory demyelinating polyneuropathy, chronic constipation, essential hypertension, GERD, dyslipidemia.  He presented to the emergency room because of pain in the right big toe, nausea and vomiting.  He hit his toe against a wall after he was trying to get around in his new wheelchair about 3 days prior to admission.Marland Kitchen  He developed abrasions on the big toe and and noted some drainage from the right big toe as well.   He was found to have DKA and sepsis secondary to right big toe cellulitis.  He was treated with IV insulin infusion, IV fluids and empiric IV antibiotics.  He was on Jardiance (empagliflozin) prior to admission and this is suspected to be the culprit for DKA.  Treatment options for diabetes was discussed.  He has taken metformin in the past but did not want to go back to metformin.  He wants to try Januvia.  His condition has improved and is deemed stable for discharge home today.  He will be discharged on cefadroxil for right big toe cellulitis.        Discharge Exam:    Vitals:   12/24/21 1940 12/25/21 0655 12/25/21 0727 12/25/21 1353  BP: 122/65 122/67 132/74 (!) 149/76  Pulse: 99 (!) 103 100 (!) 110  Resp: '18 20 18 18  '$ Temp: 98 F (36.7 C) 98.4 F (36.9 C) 98.2 F (36.8 C) 99 F (37.2 C)  TempSrc:  Oral Oral Oral  SpO2: 97% 100% 99% 100%  Weight:      Height:         GEN: NAD SKIN: No rash EYES: EOMI ENT: MMM CV: RRR PULM: CTA B ABD: soft, ND, NT, +BS CNS: AAO x 3, paraparesis (power of 2/5 in both lower extremities) EXT: Wound on the dorsal aspect of the right big toe with mild swelling   The results of significant diagnostics from this hospitalization (including imaging, microbiology, ancillary and laboratory) are listed below for reference.     Procedures and Diagnostic Studies:   CT ABDOMEN PELVIS W CONTRAST  Result Date: 12/23/2021 CLINICAL DATA:  Sepsis EXAM: CT ABDOMEN AND PELVIS WITH CONTRAST TECHNIQUE: Multidetector CT imaging of the abdomen and pelvis was performed using the standard protocol following bolus administration of intravenous contrast. RADIATION DOSE REDUCTION: This exam was performed according to the departmental dose-optimization program which includes automated exposure control, adjustment of the mA  and/or kV according to patient size and/or use of iterative reconstruction technique. CONTRAST:  151m OMNIPAQUE IOHEXOL 300 MG/ML  SOLN COMPARISON:  None. FINDINGS: Lower chest: Small pericardial effusion is seen. Hepatobiliary: There is fatty infiltration in the liver. No focal abnormality is seen. Gallbladder is unremarkable. Pancreas: No focal abnormality is seen. Spleen: Unremarkable. Adrenals/Urinary Tract: Adrenals are unremarkable. There is no hydronephrosis. Ureters not dilated. In the image 36 of series 2, there is questionable 2 mm calcific density in the central portion of the right kidney, possibly vascular calcification. Urinary bladder is unremarkable. GI tract: Small hiatal  hernia is seen. Stomach is moderately distended. Small bowel loops are not dilated. Appendix is not dilated. There is no significant wall thickening in colon. There is no pericolic stranding. Vascular/Lymphatic: There are scattered arterial calcifications. Reproductive: Unremarkable. Other: There is no ascites or pneumoperitoneum. Small right inguinal hernia containing fat is seen. Musculoskeletal: Degenerative changes are noted in the lumbar spine, particularly at L5-S1 level with disc space narrowing and encroachment of neural foramina. IMPRESSION: There is no evidence of intestinal obstruction or pneumoperitoneum. Appendix is not dilated. There is no hydronephrosis. Fatty liver. Small hiatal hernia. There is 2 mm questionable calcific density in the midportion of right kidney, possibly calcification in the renal artery branch. Other findings as described in the body of the report. Electronically Signed   By: PElmer PickerM.D.   On: 12/23/2021 15:56   DG Chest Portable 1 View  Result Date: 12/23/2021 CLINICAL DATA:  Sepsis.  Evaluate for pneumonia. EXAM: PORTABLE CHEST 1 VIEW COMPARISON:  10/10/2019 FINDINGS: There again mildly decreased lung volumes. Cardiac silhouette and mediastinal contours are within normal limits with mild calcification seen within the aortic arch. The lungs are clear. No pleural effusion or pneumothorax. Moderate multilevel degenerative disc changes of the thoracic spine. IMPRESSION: No active disease. Electronically Signed   By: RYvonne KendallM.D.   On: 12/23/2021 13:52   DG Foot Complete Right  Result Date: 12/23/2021 CLINICAL DATA:  Pneumonia.  Sepsis. EXAM: RIGHT FOOT COMPLETE - 3+ VIEW COMPARISON:  None. FINDINGS: The bones are diffusely osteopenic. No evidence fracture or dislocation. No finding to indicate osteomyelitis. Chronic degenerative change of the MTP joint of the great toe. Regional arterial calcification is noted. IMPRESSION: Diffuse osteopenia. No finding that  would allow diagnosis of focal osteomyelitis. Electronically Signed   By: MNelson ChimesM.D.   On: 12/23/2021 13:54     Labs:   Basic Metabolic Panel: Recent Labs  Lab 12/23/21 1928 12/24/21 0004 12/24/21 0457 12/24/21 0743 12/24/21 1426 12/25/21 0432  NA 139 136 138 137 135 139  K 4.3 4.4 3.7 3.5 3.5 3.2*  CL 111 112* 110 112* 109 113*  CO2 12* 12* 16* 14* 15* 19*  GLUCOSE 120* 117* 122* 141* 165* 118*  BUN '16 13 11 10 6 6  '$ CREATININE 0.79 0.64 0.48* 0.44* 0.54* 0.38*  CALCIUM 8.9 8.6* 9.0 8.5* 8.6* 8.1*  MG 2.0  --   --   --  1.8 2.1  PHOS  --   --   --   --  1.4* 1.5*   GFR Estimated Creatinine Clearance: 107.2 mL/min (A) (by C-G formula based on SCr of 0.38 mg/dL (L)). Liver Function Tests: Recent Labs  Lab 12/23/21 1307  AST 37  ALT 28  ALKPHOS 155*  BILITOT 1.7*  PROT 9.4*  ALBUMIN 4.3   No results for input(s): LIPASE, AMYLASE in the last 168 hours. No results for input(s): AMMONIA  in the last 168 hours. Coagulation profile No results for input(s): INR, PROTIME in the last 168 hours.  CBC: Recent Labs  Lab 12/23/21 1307 12/24/21 0457  WBC 18.9* 10.3  NEUTROABS 16.9*  --   HGB 14.2 12.1*  HCT 45.6 36.9*  MCV 93.8 89.1  PLT 269 216   Cardiac Enzymes: No results for input(s): CKTOTAL, CKMB, CKMBINDEX, TROPONINI in the last 168 hours. BNP: Invalid input(s): POCBNP CBG: Recent Labs  Lab 12/24/21 1102 12/24/21 1210 12/24/21 2225 12/25/21 0749 12/25/21 1122  GLUCAP 149* 128* 125* 122* 137*   D-Dimer No results for input(s): DDIMER in the last 72 hours. Hgb A1c No results for input(s): HGBA1C in the last 72 hours. Lipid Profile No results for input(s): CHOL, HDL, LDLCALC, TRIG, CHOLHDL, LDLDIRECT in the last 72 hours. Thyroid function studies No results for input(s): TSH, T4TOTAL, T3FREE, THYROIDAB in the last 72 hours.  Invalid input(s): FREET3 Anemia work up No results for input(s): VITAMINB12, FOLATE, FERRITIN, TIBC, IRON, RETICCTPCT  in the last 72 hours. Microbiology Recent Results (from the past 240 hour(s))  Culture, blood (single)     Status: None (Preliminary result)   Collection Time: 12/23/21 12:55 PM   Specimen: BLOOD  Result Value Ref Range Status   Specimen Description BLOOD BLOOD LEFT HAND  Final   Special Requests   Final    BOTTLES DRAWN AEROBIC AND ANAEROBIC Blood Culture adequate volume   Culture   Final    NO GROWTH 2 DAYS Performed at [facility], [address], [place], [place] [address]    Report Status PENDING  Incomplete  Resp Panel by RT-PCR (Flu A&B, Covid) Nasopharyngeal Swab     Status: None   Collection Time: 12/23/21  3:28 PM   Specimen: Nasopharyngeal Swab; Nasopharyngeal(NP) swabs in vial transport medium  Result Value Ref Range Status   SARS Coronavirus 2 by RT PCR NEGATIVE NEGATIVE Final    Comment: (NOTE) SARS-CoV-2 target nucleic acids are NOT DETECTED.  The SARS-CoV-2 RNA is generally detectable in upper respiratory specimens during the acute phase of infection. The lowest concentration of SARS-CoV-2 viral copies this assay can detect is 138 copies/mL. A negative result does not preclude SARS-Cov-2 infection and should not be used as the sole basis for treatment or other patient management decisions. A negative result may occur with  improper specimen collection/handling, submission of specimen other than nasopharyngeal swab, presence of viral mutation(s) within the areas targeted by this assay, and inadequate number of viral copies(<138 copies/mL). A negative result must be combined with clinical observations, patient history, and epidemiological information. The expected result is Negative.  Fact Sheet for Patients:  EntrepreneurPulse.com.au  Fact Sheet for Healthcare Providers:  IncredibleEmployment.be  This test is no t yet approved or cleared by the [place] FDA and  has been authorized for detection and/or  diagnosis of SARS-CoV-2 by FDA under an Emergency Use Authorization (EUA). This EUA will remain  in effect (meaning this test can be used) for the duration of the COVID-19 declaration under Section 564(b)(1) of the Act, 21 U.S.C.section 360bbb-3(b)(1), unless the authorization is terminated  or revoked sooner.       Influenza A by PCR NEGATIVE NEGATIVE Final   Influenza B by PCR NEGATIVE NEGATIVE Final    Comment: (NOTE) The Xpert Xpress SARS-CoV-2/FLU/RSV plus assay is intended as an aid in the diagnosis of influenza from Nasopharyngeal swab specimens and should not be used as a sole basis for treatment. Nasal washings and aspirates  are unacceptable for Xpert Xpress SARS-CoV-2/FLU/RSV testing.  Fact Sheet for Patients: EntrepreneurPulse.com.au  Fact Sheet for Healthcare Providers: IncredibleEmployment.be  This test is not yet approved or cleared by the [place] FDA and has been authorized for detection and/or diagnosis of SARS-CoV-2 by FDA under an Emergency Use Authorization (EUA). This EUA will remain in effect (meaning this test can be used) for the duration of the COVID-19 declaration under Section 564(b)(1) of the Act, 21 U.S.C. section 360bbb-3(b)(1), unless the authorization is terminated or revoked.  Performed at [facility], [address] [place], [place] [address]   MRSA Next Gen by PCR, Nasal     Status: None   Collection Time: 12/23/21 10:05 PM   Specimen: Nasal Mucosa; Nasal Swab  Result Value Ref Range Status   MRSA by PCR Next Gen NOT DETECTED NOT DETECTED Final    Comment: (NOTE) The GeneXpert MRSA Assay (FDA approved for NASAL specimens only), is one component of a comprehensive MRSA colonization surveillance program. It is not intended to diagnose MRSA infection nor to guide or monitor treatment for MRSA infections. Test performance is not FDA approved in patients less than 3 years old. Performed at  [facility], [address] [place], [place] [address]      Discharge Instructions:   Discharge Instructions     Diet - low sodium heart healthy   Complete by: As directed    Diet Carb Modified   Complete by: As directed    Discharge wound care:   Complete by: As directed    Apply iodine from the swabsticks or swab pads from clean utility to right great toe, and the space between this toe and the second.  Allow to air dry.   Face-to-face encounter (required for Medicare/Medicaid patients)   Complete by: As directed    I Ladean Steinmeyer certify that this patient is under my care and that I, or a nurse practitioner or physician's assistant working with me, had a face-to-face encounter that meets the physician face-to-face encounter requirements with this patient on 12/24/2021. The encounter with the patient was in whole, or in part for the following medical condition(s) which is the primary reason for home health care (List medical condition): Neuropathy, diabetes with DKA   The encounter with the patient was in whole, or in part, for the following medical condition, which is the primary reason for home health care: Neuropathy, diabetes with DKA   I certify that, based on my findings, the following services are medically necessary home health services:  Physical therapy Nursing     Reason for Medically Necessary Home Health Services:  Skilled Nursing- Skilled Assessment/Observation Therapy- Personnel officer, Training and development officer and Stair Training     My clinical findings support the need for the above services: Unable to leave home safely without assistance and/or assistive device   Further, I certify that my clinical findings support that this patient is homebound due to: Unable to leave home safely without assistance   Home Health   Complete by: As directed    To provide the following care/treatments:  PT RN     Increase activity slowly   Complete by: As directed    Schedule  appointment   Complete by: As directed    Follow up with PCP in 1 week      Allergies as of 12/25/2021   No Known Allergies      Medication List     STOP taking these medications  Jardiance 10 MG Tabs tablet Generic drug: empagliflozin       TAKE these medications    amLODipine 10 MG tablet Commonly known as: NORVASC TAKE 1 TABLET BY MOUTH  DAILY   aspirin EC 81 MG tablet Take 81 mg by mouth daily. Swallow whole.   atorvastatin 40 MG tablet Commonly known as: LIPITOR TAKE 1 TABLET BY MOUTH EVERY DAY   cefadroxil 500 MG capsule Commonly known as: DURICEF Take 1 capsule (500 mg total) by mouth 2 (two) times daily for 3 days.   Ensure Active High Protein Liqd Take 1 Can by mouth 3 (three) times daily as needed.   FISH OIL PO Take 1 capsule by mouth daily.   gabapentin 100 MG capsule Commonly known as: NEURONTIN Take 200 mg by mouth 3 (three) times daily.   Linzess 72 MCG capsule Generic drug: linaclotide TAKE 1 CAPSULE BY MOUTH EVERY DAY BEFORE BREAKFAST What changed: See the new instructions.   multivitamin capsule Take 1 capsule by mouth daily.   omeprazole 20 MG capsule Commonly known as: PRILOSEC TAKE 1 CAPSULE BY MOUTH EVERY DAY What changed: how much to take   phosphorus 155-852-130 MG tablet Commonly known as: K PHOS NEUTRAL Take 2 tablets (500 mg total) by mouth 2 (two) times daily for 3 days.   sertraline 100 MG tablet Commonly known as: ZOLOFT Take 1 tablet (100 mg total) by mouth daily.   sildenafil 50 MG tablet Commonly known as: VIAGRA Take 1 tablet (50 mg total) by mouth as needed for erectile dysfunction.   sitaGLIPtin 25 MG tablet Commonly known as: Januvia Take 1 tablet (25 mg total) by mouth daily.   sodium chloride 0.9 % infusion Inject into the vein.               Discharge Care Instructions  (From admission, onward)           Start     Ordered   12/25/21 0000  Discharge wound care:       Comments:  Apply iodine from the swabsticks or swab pads from clean utility to right great toe, and the space between this toe and the second.  Allow to air dry.   12/25/21 1055               If you experience worsening of your admission symptoms, develop shortness of breath, life threatening emergency, suicidal or homicidal thoughts you must seek medical attention immediately by calling 911 or calling your MD immediately  if symptoms less severe.   You must read complete instructions/literature along with all the possible adverse reactions/side effects for all the medicines you take and that have been prescribed to you. Take any new medicines after you have completely understood and accept all the possible adverse reactions/side effects.    Please note   You were cared for by a hospitalist during your hospital stay. If you have any questions about your discharge medications or the care you received while you were in the hospital after you are discharged, you can call the unit and asked to speak with the hospitalist on call if the hospitalist that took care of you is not available. Once you are discharged, your primary care physician will handle any further medical issues. Please note that NO REFILLS for any discharge medications will be authorized once you are discharged, as it is imperative that you return to your primary care physician (or establish a relationship with a primary care physician if you do  not have one) for your aftercare needs so that they can reassess your need for medications and monitor your lab values.       Time coordinating discharge: Greater than 30 minutes  Signed:  Altair Appenzeller  [facility] 12/25/2021, 3:18 PM   Pager on [email or web address]. If 7PM-7AM, please contact night-coverage at [email or web address]

## 2021-12-27 ENCOUNTER — Telehealth: Payer: Self-pay

## 2021-12-27 NOTE — Telephone Encounter (Signed)
Transition Care Management Unsuccessful Follow-up Telephone Call ? ?Date of discharge and from where:  12/25/21 [facility] ? ?Attempts:  1st Attempt ? ?Reason for unsuccessful TCM follow-up call:  No answer/busy. Will follow. ? ?  ?

## 2021-12-28 ENCOUNTER — Telehealth: Payer: Self-pay | Admitting: Family Medicine

## 2021-12-28 LAB — CULTURE, BLOOD (SINGLE)
Culture: NO GROWTH
Special Requests: ADEQUATE

## 2021-12-28 NOTE — Telephone Encounter (Signed)
Please let the patient know that his insurance will not pay for this given that he is not on insulin. ?

## 2021-12-28 NOTE — Telephone Encounter (Signed)
Transition Care Management Follow-up Telephone Call ?Date of discharge and from where: 12/25/21 [facility] ?How have you been since you were released from the hospital? Pain in R toe is relieved. Bandage clean/dry. Podiatry scheduled at the end of this month. Diabetes in control. FBS 98 today. Denies N/V/D and all other alarming symptoms. Patient states he is doing really well.  ?Any questions or concerns? No ? ?Items Reviewed: ?Did the pt receive and understand the discharge instructions provided? Yes  ?Medications obtained and verified? Yes  ?Any new allergies since your discharge? No  ?Dietary orders reviewed? Yes, low sodium/low carb. ?Do you have support at home? Yes  ? ?Home Care and Equipment/Supplies: ?Were home health services ordered? Yes, 2 hours daily.  ? ?Functional Questionnaire: (I = Independent and D = Dependent) ?ADLs: Aide and family assist ? ?Eating- I ? ?Maintaining continence- I ? ?Transferring/Ambulation- Wheelchair ? ?Managing Meds- I ? ?Follow up appointments reviewed: ? ?PCP Hospital f/u appt confirmed? Yes  Scheduled to see PCP on 01/04/22 '@1130'$ . First available 30 minute slot. ?[facility] f/u appt confirmed?  Scheduled to see Neurology on 01/07/22 @ 1:15. ?Are transportation arrangements needed? Yes , patient is making arrangements.with [facility] transportation. Will reschedule appointment as needed.  ?If their condition worsens, is the pt aware to call PCP or go to the Emergency Dept.? Yes ?Was the patient provided with contact information for the PCP's office or ED? Yes ?Was to pt encouraged to call back with questions or concerns? Yes  ?

## 2021-12-28 NOTE — Telephone Encounter (Signed)
Patient would like to be prescribe the [place] device. ?

## 2021-12-28 NOTE — Telephone Encounter (Signed)
I called the patient and informed him that his insurance would not cover the [place] because he is not on insulin, he understood and request that he get a new meter kit to continue to check his sugar sent to his pharmacy.  Michael Montgomery,cma  ?

## 2021-12-29 ENCOUNTER — Telehealth: Payer: Self-pay | Admitting: Family Medicine

## 2021-12-29 DIAGNOSIS — Z993 Dependence on wheelchair: Secondary | ICD-10-CM | POA: Diagnosis not present

## 2021-12-29 DIAGNOSIS — K5904 Chronic idiopathic constipation: Secondary | ICD-10-CM | POA: Diagnosis not present

## 2021-12-29 DIAGNOSIS — S90411D Abrasion, right great toe, subsequent encounter: Secondary | ICD-10-CM | POA: Diagnosis not present

## 2021-12-29 DIAGNOSIS — G6181 Chronic inflammatory demyelinating polyneuritis: Secondary | ICD-10-CM | POA: Diagnosis not present

## 2021-12-29 DIAGNOSIS — A419 Sepsis, unspecified organism: Secondary | ICD-10-CM | POA: Diagnosis not present

## 2021-12-29 DIAGNOSIS — K219 Gastro-esophageal reflux disease without esophagitis: Secondary | ICD-10-CM | POA: Diagnosis not present

## 2021-12-29 DIAGNOSIS — Z7982 Long term (current) use of aspirin: Secondary | ICD-10-CM | POA: Diagnosis not present

## 2021-12-29 DIAGNOSIS — E7800 Pure hypercholesterolemia, unspecified: Secondary | ICD-10-CM | POA: Diagnosis not present

## 2021-12-29 DIAGNOSIS — W228XXD Striking against or struck by other objects, subsequent encounter: Secondary | ICD-10-CM | POA: Diagnosis not present

## 2021-12-29 DIAGNOSIS — Z9181 History of falling: Secondary | ICD-10-CM | POA: Diagnosis not present

## 2021-12-29 DIAGNOSIS — K259 Gastric ulcer, unspecified as acute or chronic, without hemorrhage or perforation: Secondary | ICD-10-CM | POA: Diagnosis not present

## 2021-12-29 DIAGNOSIS — E1144 Type 2 diabetes mellitus with diabetic amyotrophy: Secondary | ICD-10-CM | POA: Diagnosis not present

## 2021-12-29 DIAGNOSIS — Z87891 Personal history of nicotine dependence: Secondary | ICD-10-CM | POA: Diagnosis not present

## 2021-12-29 DIAGNOSIS — E1140 Type 2 diabetes mellitus with diabetic neuropathy, unspecified: Secondary | ICD-10-CM | POA: Diagnosis not present

## 2021-12-29 DIAGNOSIS — I10 Essential (primary) hypertension: Secondary | ICD-10-CM | POA: Diagnosis not present

## 2021-12-29 DIAGNOSIS — L03031 Cellulitis of right toe: Secondary | ICD-10-CM | POA: Diagnosis not present

## 2021-12-29 DIAGNOSIS — Z7984 Long term (current) use of oral hypoglycemic drugs: Secondary | ICD-10-CM | POA: Diagnosis not present

## 2021-12-29 MED ORDER — ACCU-CHEK SOFTCLIX LANCETS MISC: 1.0000 | Freq: Every day | 3 refills | Status: AC

## 2021-12-29 MED ORDER — GLUCOSE BLOOD VI STRP: ORAL_STRIP | 12 refills | Status: DC

## 2021-12-29 MED ORDER — BLOOD GLUCOSE MONITOR KIT: PACK | 0 refills | Status: DC

## 2021-12-29 NOTE — Telephone Encounter (Signed)
I sent this to the local pharmacy on record.  If that is not the correct pharmacy you can send it to wherever he would like. ?

## 2021-12-29 NOTE — Telephone Encounter (Signed)
Reviewed

## 2021-12-29 NOTE — Telephone Encounter (Signed)
Rachel from [facility] in [place] called . They need 3 separate prescriptions for the blood glucose meter kit and supplies KIT, insurance requires this in order for them to pay. ?

## 2021-12-29 NOTE — Addendum Note (Signed)
Addended by: Leone Haven on: 12/29/2021 02:41 PM ? ? Modules accepted: Orders ? ?

## 2021-12-29 NOTE — Telephone Encounter (Signed)
Sent to pharmacy 

## 2022-01-04 ENCOUNTER — Inpatient Hospital Stay: Payer: Medicare Other | Admitting: Family Medicine

## 2022-01-04 DIAGNOSIS — Z993 Dependence on wheelchair: Secondary | ICD-10-CM | POA: Diagnosis not present

## 2022-01-04 DIAGNOSIS — W228XXD Striking against or struck by other objects, subsequent encounter: Secondary | ICD-10-CM | POA: Diagnosis not present

## 2022-01-04 DIAGNOSIS — I10 Essential (primary) hypertension: Secondary | ICD-10-CM | POA: Diagnosis not present

## 2022-01-04 DIAGNOSIS — A419 Sepsis, unspecified organism: Secondary | ICD-10-CM | POA: Diagnosis not present

## 2022-01-04 DIAGNOSIS — L03031 Cellulitis of right toe: Secondary | ICD-10-CM | POA: Diagnosis not present

## 2022-01-04 DIAGNOSIS — K219 Gastro-esophageal reflux disease without esophagitis: Secondary | ICD-10-CM | POA: Diagnosis not present

## 2022-01-04 DIAGNOSIS — Z9181 History of falling: Secondary | ICD-10-CM | POA: Diagnosis not present

## 2022-01-04 DIAGNOSIS — E1140 Type 2 diabetes mellitus with diabetic neuropathy, unspecified: Secondary | ICD-10-CM | POA: Diagnosis not present

## 2022-01-04 DIAGNOSIS — Z87891 Personal history of nicotine dependence: Secondary | ICD-10-CM | POA: Diagnosis not present

## 2022-01-04 DIAGNOSIS — Z7984 Long term (current) use of oral hypoglycemic drugs: Secondary | ICD-10-CM | POA: Diagnosis not present

## 2022-01-04 DIAGNOSIS — K259 Gastric ulcer, unspecified as acute or chronic, without hemorrhage or perforation: Secondary | ICD-10-CM | POA: Diagnosis not present

## 2022-01-04 DIAGNOSIS — G6181 Chronic inflammatory demyelinating polyneuritis: Secondary | ICD-10-CM | POA: Diagnosis not present

## 2022-01-04 DIAGNOSIS — E1144 Type 2 diabetes mellitus with diabetic amyotrophy: Secondary | ICD-10-CM | POA: Diagnosis not present

## 2022-01-04 DIAGNOSIS — Z7982 Long term (current) use of aspirin: Secondary | ICD-10-CM | POA: Diagnosis not present

## 2022-01-04 DIAGNOSIS — E7800 Pure hypercholesterolemia, unspecified: Secondary | ICD-10-CM | POA: Diagnosis not present

## 2022-01-04 DIAGNOSIS — K5904 Chronic idiopathic constipation: Secondary | ICD-10-CM | POA: Diagnosis not present

## 2022-01-04 DIAGNOSIS — S90411D Abrasion, right great toe, subsequent encounter: Secondary | ICD-10-CM | POA: Diagnosis not present

## 2022-01-06 ENCOUNTER — Other Ambulatory Visit: Payer: Medicare Other

## 2022-01-06 DIAGNOSIS — K259 Gastric ulcer, unspecified as acute or chronic, without hemorrhage or perforation: Secondary | ICD-10-CM | POA: Diagnosis not present

## 2022-01-06 DIAGNOSIS — E1140 Type 2 diabetes mellitus with diabetic neuropathy, unspecified: Secondary | ICD-10-CM | POA: Diagnosis not present

## 2022-01-06 DIAGNOSIS — Z7982 Long term (current) use of aspirin: Secondary | ICD-10-CM | POA: Diagnosis not present

## 2022-01-06 DIAGNOSIS — E1144 Type 2 diabetes mellitus with diabetic amyotrophy: Secondary | ICD-10-CM | POA: Diagnosis not present

## 2022-01-06 DIAGNOSIS — Z993 Dependence on wheelchair: Secondary | ICD-10-CM | POA: Diagnosis not present

## 2022-01-06 DIAGNOSIS — E7800 Pure hypercholesterolemia, unspecified: Secondary | ICD-10-CM | POA: Diagnosis not present

## 2022-01-06 DIAGNOSIS — L03031 Cellulitis of right toe: Secondary | ICD-10-CM | POA: Diagnosis not present

## 2022-01-06 DIAGNOSIS — S90411D Abrasion, right great toe, subsequent encounter: Secondary | ICD-10-CM | POA: Diagnosis not present

## 2022-01-06 DIAGNOSIS — G6181 Chronic inflammatory demyelinating polyneuritis: Secondary | ICD-10-CM | POA: Diagnosis not present

## 2022-01-06 DIAGNOSIS — K5904 Chronic idiopathic constipation: Secondary | ICD-10-CM | POA: Diagnosis not present

## 2022-01-06 DIAGNOSIS — I10 Essential (primary) hypertension: Secondary | ICD-10-CM | POA: Diagnosis not present

## 2022-01-06 DIAGNOSIS — W228XXD Striking against or struck by other objects, subsequent encounter: Secondary | ICD-10-CM | POA: Diagnosis not present

## 2022-01-06 DIAGNOSIS — Z87891 Personal history of nicotine dependence: Secondary | ICD-10-CM | POA: Diagnosis not present

## 2022-01-06 DIAGNOSIS — Z7984 Long term (current) use of oral hypoglycemic drugs: Secondary | ICD-10-CM | POA: Diagnosis not present

## 2022-01-06 DIAGNOSIS — K219 Gastro-esophageal reflux disease without esophagitis: Secondary | ICD-10-CM | POA: Diagnosis not present

## 2022-01-06 DIAGNOSIS — Z9181 History of falling: Secondary | ICD-10-CM | POA: Diagnosis not present

## 2022-01-06 DIAGNOSIS — A419 Sepsis, unspecified organism: Secondary | ICD-10-CM | POA: Diagnosis not present

## 2022-01-07 DIAGNOSIS — G6181 Chronic inflammatory demyelinating polyneuritis: Secondary | ICD-10-CM | POA: Diagnosis not present

## 2022-01-11 DIAGNOSIS — E7800 Pure hypercholesterolemia, unspecified: Secondary | ICD-10-CM | POA: Diagnosis not present

## 2022-01-11 DIAGNOSIS — K219 Gastro-esophageal reflux disease without esophagitis: Secondary | ICD-10-CM | POA: Diagnosis not present

## 2022-01-11 DIAGNOSIS — I10 Essential (primary) hypertension: Secondary | ICD-10-CM | POA: Diagnosis not present

## 2022-01-11 DIAGNOSIS — E1144 Type 2 diabetes mellitus with diabetic amyotrophy: Secondary | ICD-10-CM | POA: Diagnosis not present

## 2022-01-11 DIAGNOSIS — W228XXD Striking against or struck by other objects, subsequent encounter: Secondary | ICD-10-CM | POA: Diagnosis not present

## 2022-01-11 DIAGNOSIS — Z993 Dependence on wheelchair: Secondary | ICD-10-CM | POA: Diagnosis not present

## 2022-01-11 DIAGNOSIS — K259 Gastric ulcer, unspecified as acute or chronic, without hemorrhage or perforation: Secondary | ICD-10-CM | POA: Diagnosis not present

## 2022-01-11 DIAGNOSIS — L03031 Cellulitis of right toe: Secondary | ICD-10-CM | POA: Diagnosis not present

## 2022-01-11 DIAGNOSIS — A419 Sepsis, unspecified organism: Secondary | ICD-10-CM | POA: Diagnosis not present

## 2022-01-11 DIAGNOSIS — E1140 Type 2 diabetes mellitus with diabetic neuropathy, unspecified: Secondary | ICD-10-CM | POA: Diagnosis not present

## 2022-01-11 DIAGNOSIS — K5904 Chronic idiopathic constipation: Secondary | ICD-10-CM | POA: Diagnosis not present

## 2022-01-11 DIAGNOSIS — Z7982 Long term (current) use of aspirin: Secondary | ICD-10-CM | POA: Diagnosis not present

## 2022-01-11 DIAGNOSIS — Z9181 History of falling: Secondary | ICD-10-CM | POA: Diagnosis not present

## 2022-01-11 DIAGNOSIS — Z87891 Personal history of nicotine dependence: Secondary | ICD-10-CM | POA: Diagnosis not present

## 2022-01-11 DIAGNOSIS — G6181 Chronic inflammatory demyelinating polyneuritis: Secondary | ICD-10-CM | POA: Diagnosis not present

## 2022-01-11 DIAGNOSIS — Z7984 Long term (current) use of oral hypoglycemic drugs: Secondary | ICD-10-CM | POA: Diagnosis not present

## 2022-01-11 DIAGNOSIS — S90411D Abrasion, right great toe, subsequent encounter: Secondary | ICD-10-CM | POA: Diagnosis not present

## 2022-01-12 DIAGNOSIS — Z7984 Long term (current) use of oral hypoglycemic drugs: Secondary | ICD-10-CM | POA: Diagnosis not present

## 2022-01-12 DIAGNOSIS — S90411D Abrasion, right great toe, subsequent encounter: Secondary | ICD-10-CM | POA: Diagnosis not present

## 2022-01-12 DIAGNOSIS — G6181 Chronic inflammatory demyelinating polyneuritis: Secondary | ICD-10-CM | POA: Diagnosis not present

## 2022-01-12 DIAGNOSIS — K259 Gastric ulcer, unspecified as acute or chronic, without hemorrhage or perforation: Secondary | ICD-10-CM | POA: Diagnosis not present

## 2022-01-12 DIAGNOSIS — K5904 Chronic idiopathic constipation: Secondary | ICD-10-CM | POA: Diagnosis not present

## 2022-01-12 DIAGNOSIS — Z993 Dependence on wheelchair: Secondary | ICD-10-CM | POA: Diagnosis not present

## 2022-01-12 DIAGNOSIS — W228XXD Striking against or struck by other objects, subsequent encounter: Secondary | ICD-10-CM | POA: Diagnosis not present

## 2022-01-12 DIAGNOSIS — E7800 Pure hypercholesterolemia, unspecified: Secondary | ICD-10-CM | POA: Diagnosis not present

## 2022-01-12 DIAGNOSIS — Z9181 History of falling: Secondary | ICD-10-CM | POA: Diagnosis not present

## 2022-01-12 DIAGNOSIS — Z7982 Long term (current) use of aspirin: Secondary | ICD-10-CM | POA: Diagnosis not present

## 2022-01-12 DIAGNOSIS — A419 Sepsis, unspecified organism: Secondary | ICD-10-CM | POA: Diagnosis not present

## 2022-01-12 DIAGNOSIS — I10 Essential (primary) hypertension: Secondary | ICD-10-CM | POA: Diagnosis not present

## 2022-01-12 DIAGNOSIS — E1140 Type 2 diabetes mellitus with diabetic neuropathy, unspecified: Secondary | ICD-10-CM | POA: Diagnosis not present

## 2022-01-12 DIAGNOSIS — K219 Gastro-esophageal reflux disease without esophagitis: Secondary | ICD-10-CM | POA: Diagnosis not present

## 2022-01-12 DIAGNOSIS — Z87891 Personal history of nicotine dependence: Secondary | ICD-10-CM | POA: Diagnosis not present

## 2022-01-12 DIAGNOSIS — L03031 Cellulitis of right toe: Secondary | ICD-10-CM | POA: Diagnosis not present

## 2022-01-12 DIAGNOSIS — E1144 Type 2 diabetes mellitus with diabetic amyotrophy: Secondary | ICD-10-CM | POA: Diagnosis not present

## 2022-01-17 ENCOUNTER — Ambulatory Visit: Payer: Medicare Other | Admitting: Gastroenterology

## 2022-01-18 DIAGNOSIS — A419 Sepsis, unspecified organism: Secondary | ICD-10-CM | POA: Diagnosis not present

## 2022-01-18 DIAGNOSIS — E1144 Type 2 diabetes mellitus with diabetic amyotrophy: Secondary | ICD-10-CM | POA: Diagnosis not present

## 2022-01-18 DIAGNOSIS — W228XXD Striking against or struck by other objects, subsequent encounter: Secondary | ICD-10-CM | POA: Diagnosis not present

## 2022-01-18 DIAGNOSIS — I10 Essential (primary) hypertension: Secondary | ICD-10-CM | POA: Diagnosis not present

## 2022-01-18 DIAGNOSIS — L03031 Cellulitis of right toe: Secondary | ICD-10-CM | POA: Diagnosis not present

## 2022-01-18 DIAGNOSIS — K5904 Chronic idiopathic constipation: Secondary | ICD-10-CM | POA: Diagnosis not present

## 2022-01-18 DIAGNOSIS — Z993 Dependence on wheelchair: Secondary | ICD-10-CM | POA: Diagnosis not present

## 2022-01-18 DIAGNOSIS — E1140 Type 2 diabetes mellitus with diabetic neuropathy, unspecified: Secondary | ICD-10-CM | POA: Diagnosis not present

## 2022-01-18 DIAGNOSIS — Z9181 History of falling: Secondary | ICD-10-CM | POA: Diagnosis not present

## 2022-01-18 DIAGNOSIS — K219 Gastro-esophageal reflux disease without esophagitis: Secondary | ICD-10-CM | POA: Diagnosis not present

## 2022-01-18 DIAGNOSIS — S90411D Abrasion, right great toe, subsequent encounter: Secondary | ICD-10-CM | POA: Diagnosis not present

## 2022-01-18 DIAGNOSIS — Z7982 Long term (current) use of aspirin: Secondary | ICD-10-CM | POA: Diagnosis not present

## 2022-01-18 DIAGNOSIS — Z87891 Personal history of nicotine dependence: Secondary | ICD-10-CM | POA: Diagnosis not present

## 2022-01-18 DIAGNOSIS — Z7984 Long term (current) use of oral hypoglycemic drugs: Secondary | ICD-10-CM | POA: Diagnosis not present

## 2022-01-18 DIAGNOSIS — E7800 Pure hypercholesterolemia, unspecified: Secondary | ICD-10-CM | POA: Diagnosis not present

## 2022-01-18 DIAGNOSIS — K259 Gastric ulcer, unspecified as acute or chronic, without hemorrhage or perforation: Secondary | ICD-10-CM | POA: Diagnosis not present

## 2022-01-18 DIAGNOSIS — G6181 Chronic inflammatory demyelinating polyneuritis: Secondary | ICD-10-CM | POA: Diagnosis not present

## 2022-01-19 DIAGNOSIS — G6181 Chronic inflammatory demyelinating polyneuritis: Secondary | ICD-10-CM | POA: Diagnosis not present

## 2022-01-19 DIAGNOSIS — A419 Sepsis, unspecified organism: Secondary | ICD-10-CM | POA: Diagnosis not present

## 2022-01-19 DIAGNOSIS — I10 Essential (primary) hypertension: Secondary | ICD-10-CM | POA: Diagnosis not present

## 2022-01-19 DIAGNOSIS — K219 Gastro-esophageal reflux disease without esophagitis: Secondary | ICD-10-CM | POA: Diagnosis not present

## 2022-01-19 DIAGNOSIS — Z87891 Personal history of nicotine dependence: Secondary | ICD-10-CM | POA: Diagnosis not present

## 2022-01-19 DIAGNOSIS — E1144 Type 2 diabetes mellitus with diabetic amyotrophy: Secondary | ICD-10-CM | POA: Diagnosis not present

## 2022-01-19 DIAGNOSIS — K5904 Chronic idiopathic constipation: Secondary | ICD-10-CM | POA: Diagnosis not present

## 2022-01-19 DIAGNOSIS — S90411D Abrasion, right great toe, subsequent encounter: Secondary | ICD-10-CM | POA: Diagnosis not present

## 2022-01-19 DIAGNOSIS — Z993 Dependence on wheelchair: Secondary | ICD-10-CM | POA: Diagnosis not present

## 2022-01-19 DIAGNOSIS — Z7982 Long term (current) use of aspirin: Secondary | ICD-10-CM | POA: Diagnosis not present

## 2022-01-19 DIAGNOSIS — E7800 Pure hypercholesterolemia, unspecified: Secondary | ICD-10-CM | POA: Diagnosis not present

## 2022-01-19 DIAGNOSIS — Z9181 History of falling: Secondary | ICD-10-CM | POA: Diagnosis not present

## 2022-01-19 DIAGNOSIS — E1140 Type 2 diabetes mellitus with diabetic neuropathy, unspecified: Secondary | ICD-10-CM | POA: Diagnosis not present

## 2022-01-19 DIAGNOSIS — K259 Gastric ulcer, unspecified as acute or chronic, without hemorrhage or perforation: Secondary | ICD-10-CM | POA: Diagnosis not present

## 2022-01-19 DIAGNOSIS — Z7984 Long term (current) use of oral hypoglycemic drugs: Secondary | ICD-10-CM | POA: Diagnosis not present

## 2022-01-19 DIAGNOSIS — L03031 Cellulitis of right toe: Secondary | ICD-10-CM | POA: Diagnosis not present

## 2022-01-19 DIAGNOSIS — W228XXD Striking against or struck by other objects, subsequent encounter: Secondary | ICD-10-CM | POA: Diagnosis not present

## 2022-01-20 ENCOUNTER — Other Ambulatory Visit: Payer: Self-pay | Admitting: Family Medicine

## 2022-01-20 ENCOUNTER — Ambulatory Visit (INDEPENDENT_AMBULATORY_CARE_PROVIDER_SITE_OTHER): Payer: Medicare Other | Admitting: Podiatry

## 2022-01-20 ENCOUNTER — Encounter: Payer: Self-pay | Admitting: Podiatry

## 2022-01-20 DIAGNOSIS — M79609 Pain in unspecified limb: Secondary | ICD-10-CM

## 2022-01-20 DIAGNOSIS — B351 Tinea unguium: Secondary | ICD-10-CM

## 2022-01-20 DIAGNOSIS — E785 Hyperlipidemia, unspecified: Secondary | ICD-10-CM

## 2022-01-20 DIAGNOSIS — E119 Type 2 diabetes mellitus without complications: Secondary | ICD-10-CM

## 2022-01-20 NOTE — Progress Notes (Signed)
This patient returns to my office for at risk foot care.  This patient requires this care by a professional since this patient will be at risk due to having diabetes.  Patient has developed a nerve condition which developed from Covid. ?This patient is unable to cut nails himself since the patient cannot reach his nails.These nails are painful walking and wearing shoes.  This patient presents for at risk foot care today. ? ?General Appearance  Alert, conversant and in no acute stress. ? ?Vascular  Dorsalis pedis and posterior tibial  pulses are palpable  bilaterally.  Capillary return is within normal limits  bilaterally. Temperature is within normal limits  bilaterally. ? ?Neurologic  Senn-Weinstein monofilament wire test within normal limits  bilaterally. Muscle power within normal limits bilaterally. ? ?Nails Thick disfigured discolored nails with subungual debris  from hallux to fifth toes bilaterally. No evidence of bacterial infection or drainage bilaterally. ? ?Orthopedic  No limitations of motion  feet .  No crepitus or effusions noted.  Hallux malleus and hammer toes 2-5  B/l. ? ?Skin  normotropic skin with no porokeratosis noted bilaterally.  No signs of infections or ulcers noted.   Ulcer 1st IPJ left foot under the care of home health care. ? ?Onychomycosis  Pain in right toes  Pain in left toes ? ?Consent was obtained for treatment procedures.   Mechanical debridement of nails 1-5  bilaterally performed with a nail nipper.  Filed with dremel without incident. [facility]adding dispensed.  Told him to make an appointment with Dr.  Posey Pronto for surgical consult. ? ? ?Return office visit  3 months                   Told patient to return for periodic foot care and evaluation due to potential at risk complications. ? ? ?Gardiner Barefoot DPM   ?

## 2022-01-24 ENCOUNTER — Inpatient Hospital Stay: Payer: Medicare Other | Admitting: Family Medicine

## 2022-01-24 DIAGNOSIS — G6181 Chronic inflammatory demyelinating polyneuritis: Secondary | ICD-10-CM | POA: Diagnosis not present

## 2022-01-25 ENCOUNTER — Telehealth: Payer: Self-pay | Admitting: Family Medicine

## 2022-01-25 DIAGNOSIS — Z7982 Long term (current) use of aspirin: Secondary | ICD-10-CM | POA: Diagnosis not present

## 2022-01-25 DIAGNOSIS — L03031 Cellulitis of right toe: Secondary | ICD-10-CM | POA: Diagnosis not present

## 2022-01-25 DIAGNOSIS — K5904 Chronic idiopathic constipation: Secondary | ICD-10-CM | POA: Diagnosis not present

## 2022-01-25 DIAGNOSIS — Z7984 Long term (current) use of oral hypoglycemic drugs: Secondary | ICD-10-CM | POA: Diagnosis not present

## 2022-01-25 DIAGNOSIS — Z87891 Personal history of nicotine dependence: Secondary | ICD-10-CM | POA: Diagnosis not present

## 2022-01-25 DIAGNOSIS — W228XXD Striking against or struck by other objects, subsequent encounter: Secondary | ICD-10-CM | POA: Diagnosis not present

## 2022-01-25 DIAGNOSIS — E1140 Type 2 diabetes mellitus with diabetic neuropathy, unspecified: Secondary | ICD-10-CM | POA: Diagnosis not present

## 2022-01-25 DIAGNOSIS — Z9181 History of falling: Secondary | ICD-10-CM | POA: Diagnosis not present

## 2022-01-25 DIAGNOSIS — K259 Gastric ulcer, unspecified as acute or chronic, without hemorrhage or perforation: Secondary | ICD-10-CM | POA: Diagnosis not present

## 2022-01-25 DIAGNOSIS — Z993 Dependence on wheelchair: Secondary | ICD-10-CM | POA: Diagnosis not present

## 2022-01-25 DIAGNOSIS — A419 Sepsis, unspecified organism: Secondary | ICD-10-CM | POA: Diagnosis not present

## 2022-01-25 DIAGNOSIS — E7800 Pure hypercholesterolemia, unspecified: Secondary | ICD-10-CM | POA: Diagnosis not present

## 2022-01-25 DIAGNOSIS — I10 Essential (primary) hypertension: Secondary | ICD-10-CM | POA: Diagnosis not present

## 2022-01-25 DIAGNOSIS — E1144 Type 2 diabetes mellitus with diabetic amyotrophy: Secondary | ICD-10-CM | POA: Diagnosis not present

## 2022-01-25 DIAGNOSIS — G6181 Chronic inflammatory demyelinating polyneuritis: Secondary | ICD-10-CM | POA: Diagnosis not present

## 2022-01-25 DIAGNOSIS — K219 Gastro-esophageal reflux disease without esophagitis: Secondary | ICD-10-CM | POA: Diagnosis not present

## 2022-01-25 DIAGNOSIS — S90411D Abrasion, right great toe, subsequent encounter: Secondary | ICD-10-CM | POA: Diagnosis not present

## 2022-01-25 NOTE — Telephone Encounter (Signed)
Patient called and was in the hospital about month ago. The hospital put him on sitaGLIPtin (JANUVIA) 25 MG tablet, now his sugar  is running between 150-180. He was on Metform and that was working fine. Patient does have a appt with Dr Caryl Bis on 02/01/2022.  ?

## 2022-01-26 DIAGNOSIS — G6181 Chronic inflammatory demyelinating polyneuritis: Secondary | ICD-10-CM | POA: Diagnosis not present

## 2022-01-26 DIAGNOSIS — L03031 Cellulitis of right toe: Secondary | ICD-10-CM | POA: Diagnosis not present

## 2022-01-26 DIAGNOSIS — A419 Sepsis, unspecified organism: Secondary | ICD-10-CM | POA: Diagnosis not present

## 2022-01-26 DIAGNOSIS — Z9181 History of falling: Secondary | ICD-10-CM | POA: Diagnosis not present

## 2022-01-26 DIAGNOSIS — K5904 Chronic idiopathic constipation: Secondary | ICD-10-CM | POA: Diagnosis not present

## 2022-01-26 DIAGNOSIS — E7800 Pure hypercholesterolemia, unspecified: Secondary | ICD-10-CM | POA: Diagnosis not present

## 2022-01-26 DIAGNOSIS — S90411D Abrasion, right great toe, subsequent encounter: Secondary | ICD-10-CM | POA: Diagnosis not present

## 2022-01-26 DIAGNOSIS — E1144 Type 2 diabetes mellitus with diabetic amyotrophy: Secondary | ICD-10-CM | POA: Diagnosis not present

## 2022-01-26 DIAGNOSIS — I10 Essential (primary) hypertension: Secondary | ICD-10-CM | POA: Diagnosis not present

## 2022-01-26 DIAGNOSIS — Z7984 Long term (current) use of oral hypoglycemic drugs: Secondary | ICD-10-CM | POA: Diagnosis not present

## 2022-01-26 DIAGNOSIS — Z7982 Long term (current) use of aspirin: Secondary | ICD-10-CM | POA: Diagnosis not present

## 2022-01-26 DIAGNOSIS — W228XXD Striking against or struck by other objects, subsequent encounter: Secondary | ICD-10-CM | POA: Diagnosis not present

## 2022-01-26 DIAGNOSIS — Z87891 Personal history of nicotine dependence: Secondary | ICD-10-CM | POA: Diagnosis not present

## 2022-01-26 DIAGNOSIS — E1140 Type 2 diabetes mellitus with diabetic neuropathy, unspecified: Secondary | ICD-10-CM | POA: Diagnosis not present

## 2022-01-26 DIAGNOSIS — Z993 Dependence on wheelchair: Secondary | ICD-10-CM | POA: Diagnosis not present

## 2022-01-26 DIAGNOSIS — K259 Gastric ulcer, unspecified as acute or chronic, without hemorrhage or perforation: Secondary | ICD-10-CM | POA: Diagnosis not present

## 2022-01-26 DIAGNOSIS — K219 Gastro-esophageal reflux disease without esophagitis: Secondary | ICD-10-CM | POA: Diagnosis not present

## 2022-01-26 MED ORDER — SITAGLIPTIN PHOSPHATE 50 MG PO TABS: 50.0000 mg | ORAL_TABLET | Freq: Every day | ORAL | 1 refills | Status: DC

## 2022-01-26 NOTE — Telephone Encounter (Addendum)
He was on jardiance before ending up in the hospital. The jardiance possibly contributed to the reason he ended up in the hospital and thus he can not go back on that. We could increase the dose of the januvia to 50 mg once daily if his sugars are not controlled. He will also need follow-up with me from his recent hospitalization.  ?

## 2022-01-26 NOTE — Addendum Note (Signed)
Addended by: Leone Haven on: 01/26/2022 03:20 PM ? ? Modules accepted: Orders ? ?

## 2022-01-26 NOTE — Telephone Encounter (Signed)
I called and spoke with the patient and informed  him that he needed to not take the Jardiance and he is okay with the increase in the Januvia and wants [facility] to [facility] [place]  [place].  Michael Montgomery,cma  ?

## 2022-01-26 NOTE — Telephone Encounter (Signed)
Januvia 50 mg daily sent to pharmacy.  ?

## 2022-01-31 ENCOUNTER — Telehealth: Payer: Self-pay | Admitting: Family Medicine

## 2022-01-31 ENCOUNTER — Other Ambulatory Visit: Payer: Self-pay | Admitting: Family Medicine

## 2022-01-31 NOTE — Telephone Encounter (Signed)
Esther From [facility] called in stating that pt missed OT visit last week because of doctor appt.Sherlynn Stalls stated that pt will miss another visit this week because pt is having infusion done... Pt advise Sherlynn Stalls that he doesn't want to be bothered with on the days he goes to infusion... Sherlynn Stalls stated that she will only go on Friday...  ?

## 2022-02-01 ENCOUNTER — Inpatient Hospital Stay: Payer: Medicare Other | Admitting: Family Medicine

## 2022-02-01 DIAGNOSIS — G6181 Chronic inflammatory demyelinating polyneuritis: Secondary | ICD-10-CM | POA: Diagnosis not present

## 2022-02-01 NOTE — Telephone Encounter (Signed)
Noted  

## 2022-02-02 DIAGNOSIS — K219 Gastro-esophageal reflux disease without esophagitis: Secondary | ICD-10-CM | POA: Diagnosis not present

## 2022-02-02 DIAGNOSIS — Z87891 Personal history of nicotine dependence: Secondary | ICD-10-CM | POA: Diagnosis not present

## 2022-02-02 DIAGNOSIS — Z7982 Long term (current) use of aspirin: Secondary | ICD-10-CM | POA: Diagnosis not present

## 2022-02-02 DIAGNOSIS — E7800 Pure hypercholesterolemia, unspecified: Secondary | ICD-10-CM | POA: Diagnosis not present

## 2022-02-02 DIAGNOSIS — S90411D Abrasion, right great toe, subsequent encounter: Secondary | ICD-10-CM | POA: Diagnosis not present

## 2022-02-02 DIAGNOSIS — E1144 Type 2 diabetes mellitus with diabetic amyotrophy: Secondary | ICD-10-CM | POA: Diagnosis not present

## 2022-02-02 DIAGNOSIS — I10 Essential (primary) hypertension: Secondary | ICD-10-CM | POA: Diagnosis not present

## 2022-02-02 DIAGNOSIS — Z9181 History of falling: Secondary | ICD-10-CM | POA: Diagnosis not present

## 2022-02-02 DIAGNOSIS — W228XXD Striking against or struck by other objects, subsequent encounter: Secondary | ICD-10-CM | POA: Diagnosis not present

## 2022-02-02 DIAGNOSIS — Z7984 Long term (current) use of oral hypoglycemic drugs: Secondary | ICD-10-CM | POA: Diagnosis not present

## 2022-02-02 DIAGNOSIS — L03031 Cellulitis of right toe: Secondary | ICD-10-CM | POA: Diagnosis not present

## 2022-02-02 DIAGNOSIS — Z993 Dependence on wheelchair: Secondary | ICD-10-CM | POA: Diagnosis not present

## 2022-02-02 DIAGNOSIS — A419 Sepsis, unspecified organism: Secondary | ICD-10-CM | POA: Diagnosis not present

## 2022-02-02 DIAGNOSIS — K259 Gastric ulcer, unspecified as acute or chronic, without hemorrhage or perforation: Secondary | ICD-10-CM | POA: Diagnosis not present

## 2022-02-02 DIAGNOSIS — E1140 Type 2 diabetes mellitus with diabetic neuropathy, unspecified: Secondary | ICD-10-CM | POA: Diagnosis not present

## 2022-02-02 DIAGNOSIS — G6181 Chronic inflammatory demyelinating polyneuritis: Secondary | ICD-10-CM | POA: Diagnosis not present

## 2022-02-02 DIAGNOSIS — K5904 Chronic idiopathic constipation: Secondary | ICD-10-CM | POA: Diagnosis not present

## 2022-02-03 DIAGNOSIS — G6181 Chronic inflammatory demyelinating polyneuritis: Secondary | ICD-10-CM | POA: Diagnosis not present

## 2022-02-04 DIAGNOSIS — E1140 Type 2 diabetes mellitus with diabetic neuropathy, unspecified: Secondary | ICD-10-CM | POA: Diagnosis not present

## 2022-02-04 DIAGNOSIS — Z7984 Long term (current) use of oral hypoglycemic drugs: Secondary | ICD-10-CM | POA: Diagnosis not present

## 2022-02-04 DIAGNOSIS — A419 Sepsis, unspecified organism: Secondary | ICD-10-CM | POA: Diagnosis not present

## 2022-02-04 DIAGNOSIS — Z993 Dependence on wheelchair: Secondary | ICD-10-CM | POA: Diagnosis not present

## 2022-02-04 DIAGNOSIS — E7800 Pure hypercholesterolemia, unspecified: Secondary | ICD-10-CM | POA: Diagnosis not present

## 2022-02-04 DIAGNOSIS — E1144 Type 2 diabetes mellitus with diabetic amyotrophy: Secondary | ICD-10-CM | POA: Diagnosis not present

## 2022-02-04 DIAGNOSIS — Z87891 Personal history of nicotine dependence: Secondary | ICD-10-CM | POA: Diagnosis not present

## 2022-02-04 DIAGNOSIS — Z9181 History of falling: Secondary | ICD-10-CM | POA: Diagnosis not present

## 2022-02-04 DIAGNOSIS — S90411D Abrasion, right great toe, subsequent encounter: Secondary | ICD-10-CM | POA: Diagnosis not present

## 2022-02-04 DIAGNOSIS — K5904 Chronic idiopathic constipation: Secondary | ICD-10-CM | POA: Diagnosis not present

## 2022-02-04 DIAGNOSIS — L03031 Cellulitis of right toe: Secondary | ICD-10-CM | POA: Diagnosis not present

## 2022-02-04 DIAGNOSIS — I10 Essential (primary) hypertension: Secondary | ICD-10-CM | POA: Diagnosis not present

## 2022-02-04 DIAGNOSIS — W228XXD Striking against or struck by other objects, subsequent encounter: Secondary | ICD-10-CM | POA: Diagnosis not present

## 2022-02-04 DIAGNOSIS — Z7982 Long term (current) use of aspirin: Secondary | ICD-10-CM | POA: Diagnosis not present

## 2022-02-04 DIAGNOSIS — K219 Gastro-esophageal reflux disease without esophagitis: Secondary | ICD-10-CM | POA: Diagnosis not present

## 2022-02-04 DIAGNOSIS — G6181 Chronic inflammatory demyelinating polyneuritis: Secondary | ICD-10-CM | POA: Diagnosis not present

## 2022-02-04 DIAGNOSIS — K259 Gastric ulcer, unspecified as acute or chronic, without hemorrhage or perforation: Secondary | ICD-10-CM | POA: Diagnosis not present

## 2022-02-04 NOTE — Telephone Encounter (Signed)
Esther From [facility] called in stating that pt requested no visit for today because he had another infusion... Sherlynn Stalls stated that Pt missed appointment earlier this week...  ?

## 2022-02-07 NOTE — Telephone Encounter (Signed)
Noted  

## 2022-02-08 ENCOUNTER — Inpatient Hospital Stay: Payer: Medicare Other | Admitting: Family Medicine

## 2022-02-08 DIAGNOSIS — L03031 Cellulitis of right toe: Secondary | ICD-10-CM | POA: Diagnosis not present

## 2022-02-08 DIAGNOSIS — Z87891 Personal history of nicotine dependence: Secondary | ICD-10-CM | POA: Diagnosis not present

## 2022-02-08 DIAGNOSIS — A419 Sepsis, unspecified organism: Secondary | ICD-10-CM | POA: Diagnosis not present

## 2022-02-08 DIAGNOSIS — Z7982 Long term (current) use of aspirin: Secondary | ICD-10-CM | POA: Diagnosis not present

## 2022-02-08 DIAGNOSIS — Z9181 History of falling: Secondary | ICD-10-CM | POA: Diagnosis not present

## 2022-02-08 DIAGNOSIS — I10 Essential (primary) hypertension: Secondary | ICD-10-CM | POA: Diagnosis not present

## 2022-02-08 DIAGNOSIS — Z7984 Long term (current) use of oral hypoglycemic drugs: Secondary | ICD-10-CM | POA: Diagnosis not present

## 2022-02-08 DIAGNOSIS — K259 Gastric ulcer, unspecified as acute or chronic, without hemorrhage or perforation: Secondary | ICD-10-CM | POA: Diagnosis not present

## 2022-02-08 DIAGNOSIS — E7800 Pure hypercholesterolemia, unspecified: Secondary | ICD-10-CM | POA: Diagnosis not present

## 2022-02-08 DIAGNOSIS — E1140 Type 2 diabetes mellitus with diabetic neuropathy, unspecified: Secondary | ICD-10-CM | POA: Diagnosis not present

## 2022-02-08 DIAGNOSIS — E1144 Type 2 diabetes mellitus with diabetic amyotrophy: Secondary | ICD-10-CM | POA: Diagnosis not present

## 2022-02-08 DIAGNOSIS — Z993 Dependence on wheelchair: Secondary | ICD-10-CM | POA: Diagnosis not present

## 2022-02-08 DIAGNOSIS — W228XXD Striking against or struck by other objects, subsequent encounter: Secondary | ICD-10-CM | POA: Diagnosis not present

## 2022-02-08 DIAGNOSIS — S90411D Abrasion, right great toe, subsequent encounter: Secondary | ICD-10-CM | POA: Diagnosis not present

## 2022-02-08 DIAGNOSIS — K5904 Chronic idiopathic constipation: Secondary | ICD-10-CM | POA: Diagnosis not present

## 2022-02-08 DIAGNOSIS — G6181 Chronic inflammatory demyelinating polyneuritis: Secondary | ICD-10-CM | POA: Diagnosis not present

## 2022-02-08 DIAGNOSIS — K219 Gastro-esophageal reflux disease without esophagitis: Secondary | ICD-10-CM | POA: Diagnosis not present

## 2022-02-09 DIAGNOSIS — Z7982 Long term (current) use of aspirin: Secondary | ICD-10-CM | POA: Diagnosis not present

## 2022-02-09 DIAGNOSIS — K219 Gastro-esophageal reflux disease without esophagitis: Secondary | ICD-10-CM | POA: Diagnosis not present

## 2022-02-09 DIAGNOSIS — L03031 Cellulitis of right toe: Secondary | ICD-10-CM | POA: Diagnosis not present

## 2022-02-09 DIAGNOSIS — I10 Essential (primary) hypertension: Secondary | ICD-10-CM | POA: Diagnosis not present

## 2022-02-09 DIAGNOSIS — A419 Sepsis, unspecified organism: Secondary | ICD-10-CM | POA: Diagnosis not present

## 2022-02-09 DIAGNOSIS — W228XXD Striking against or struck by other objects, subsequent encounter: Secondary | ICD-10-CM | POA: Diagnosis not present

## 2022-02-09 DIAGNOSIS — K5904 Chronic idiopathic constipation: Secondary | ICD-10-CM | POA: Diagnosis not present

## 2022-02-09 DIAGNOSIS — E1140 Type 2 diabetes mellitus with diabetic neuropathy, unspecified: Secondary | ICD-10-CM | POA: Diagnosis not present

## 2022-02-09 DIAGNOSIS — E7800 Pure hypercholesterolemia, unspecified: Secondary | ICD-10-CM | POA: Diagnosis not present

## 2022-02-09 DIAGNOSIS — G6181 Chronic inflammatory demyelinating polyneuritis: Secondary | ICD-10-CM | POA: Diagnosis not present

## 2022-02-09 DIAGNOSIS — S90411D Abrasion, right great toe, subsequent encounter: Secondary | ICD-10-CM | POA: Diagnosis not present

## 2022-02-09 DIAGNOSIS — Z87891 Personal history of nicotine dependence: Secondary | ICD-10-CM | POA: Diagnosis not present

## 2022-02-09 DIAGNOSIS — Z7984 Long term (current) use of oral hypoglycemic drugs: Secondary | ICD-10-CM | POA: Diagnosis not present

## 2022-02-09 DIAGNOSIS — E1144 Type 2 diabetes mellitus with diabetic amyotrophy: Secondary | ICD-10-CM | POA: Diagnosis not present

## 2022-02-09 DIAGNOSIS — Z993 Dependence on wheelchair: Secondary | ICD-10-CM | POA: Diagnosis not present

## 2022-02-09 DIAGNOSIS — K259 Gastric ulcer, unspecified as acute or chronic, without hemorrhage or perforation: Secondary | ICD-10-CM | POA: Diagnosis not present

## 2022-02-09 DIAGNOSIS — Z9181 History of falling: Secondary | ICD-10-CM | POA: Diagnosis not present

## 2022-02-10 NOTE — Telephone Encounter (Signed)
Michael Montgomery called in stating that pt is no longer interested in OT any more...  ?

## 2022-02-15 ENCOUNTER — Other Ambulatory Visit: Payer: Self-pay | Admitting: Family Medicine

## 2022-02-15 DIAGNOSIS — K219 Gastro-esophageal reflux disease without esophagitis: Secondary | ICD-10-CM | POA: Diagnosis not present

## 2022-02-15 DIAGNOSIS — L03031 Cellulitis of right toe: Secondary | ICD-10-CM | POA: Diagnosis not present

## 2022-02-15 DIAGNOSIS — Z87891 Personal history of nicotine dependence: Secondary | ICD-10-CM | POA: Diagnosis not present

## 2022-02-15 DIAGNOSIS — Z9181 History of falling: Secondary | ICD-10-CM | POA: Diagnosis not present

## 2022-02-15 DIAGNOSIS — G6181 Chronic inflammatory demyelinating polyneuritis: Secondary | ICD-10-CM | POA: Diagnosis not present

## 2022-02-15 DIAGNOSIS — E7800 Pure hypercholesterolemia, unspecified: Secondary | ICD-10-CM | POA: Diagnosis not present

## 2022-02-15 DIAGNOSIS — Z7984 Long term (current) use of oral hypoglycemic drugs: Secondary | ICD-10-CM | POA: Diagnosis not present

## 2022-02-15 DIAGNOSIS — E1144 Type 2 diabetes mellitus with diabetic amyotrophy: Secondary | ICD-10-CM | POA: Diagnosis not present

## 2022-02-15 DIAGNOSIS — Z7982 Long term (current) use of aspirin: Secondary | ICD-10-CM | POA: Diagnosis not present

## 2022-02-15 DIAGNOSIS — W228XXD Striking against or struck by other objects, subsequent encounter: Secondary | ICD-10-CM | POA: Diagnosis not present

## 2022-02-15 DIAGNOSIS — E1140 Type 2 diabetes mellitus with diabetic neuropathy, unspecified: Secondary | ICD-10-CM | POA: Diagnosis not present

## 2022-02-15 DIAGNOSIS — I10 Essential (primary) hypertension: Secondary | ICD-10-CM | POA: Diagnosis not present

## 2022-02-15 DIAGNOSIS — Z993 Dependence on wheelchair: Secondary | ICD-10-CM | POA: Diagnosis not present

## 2022-02-15 DIAGNOSIS — K5904 Chronic idiopathic constipation: Secondary | ICD-10-CM | POA: Diagnosis not present

## 2022-02-15 DIAGNOSIS — K259 Gastric ulcer, unspecified as acute or chronic, without hemorrhage or perforation: Secondary | ICD-10-CM | POA: Diagnosis not present

## 2022-02-15 DIAGNOSIS — S90411D Abrasion, right great toe, subsequent encounter: Secondary | ICD-10-CM | POA: Diagnosis not present

## 2022-02-15 DIAGNOSIS — A419 Sepsis, unspecified organism: Secondary | ICD-10-CM | POA: Diagnosis not present

## 2022-02-15 DIAGNOSIS — E785 Hyperlipidemia, unspecified: Secondary | ICD-10-CM

## 2022-02-16 ENCOUNTER — Telehealth (INDEPENDENT_AMBULATORY_CARE_PROVIDER_SITE_OTHER): Payer: Medicare Other | Admitting: Family Medicine

## 2022-02-16 ENCOUNTER — Encounter: Payer: Self-pay | Admitting: Family Medicine

## 2022-02-16 DIAGNOSIS — Z87891 Personal history of nicotine dependence: Secondary | ICD-10-CM | POA: Diagnosis not present

## 2022-02-16 DIAGNOSIS — W228XXD Striking against or struck by other objects, subsequent encounter: Secondary | ICD-10-CM | POA: Diagnosis not present

## 2022-02-16 DIAGNOSIS — E1140 Type 2 diabetes mellitus with diabetic neuropathy, unspecified: Secondary | ICD-10-CM | POA: Diagnosis not present

## 2022-02-16 DIAGNOSIS — K5904 Chronic idiopathic constipation: Secondary | ICD-10-CM | POA: Diagnosis not present

## 2022-02-16 DIAGNOSIS — I10 Essential (primary) hypertension: Secondary | ICD-10-CM | POA: Diagnosis not present

## 2022-02-16 DIAGNOSIS — K259 Gastric ulcer, unspecified as acute or chronic, without hemorrhage or perforation: Secondary | ICD-10-CM | POA: Diagnosis not present

## 2022-02-16 DIAGNOSIS — M2040 Other hammer toe(s) (acquired), unspecified foot: Secondary | ICD-10-CM | POA: Diagnosis not present

## 2022-02-16 DIAGNOSIS — L03031 Cellulitis of right toe: Secondary | ICD-10-CM | POA: Diagnosis not present

## 2022-02-16 DIAGNOSIS — Z9181 History of falling: Secondary | ICD-10-CM | POA: Diagnosis not present

## 2022-02-16 DIAGNOSIS — A419 Sepsis, unspecified organism: Secondary | ICD-10-CM | POA: Diagnosis not present

## 2022-02-16 DIAGNOSIS — Z7984 Long term (current) use of oral hypoglycemic drugs: Secondary | ICD-10-CM | POA: Diagnosis not present

## 2022-02-16 DIAGNOSIS — E1144 Type 2 diabetes mellitus with diabetic amyotrophy: Secondary | ICD-10-CM | POA: Diagnosis not present

## 2022-02-16 DIAGNOSIS — F330 Major depressive disorder, recurrent, mild: Secondary | ICD-10-CM

## 2022-02-16 DIAGNOSIS — E119 Type 2 diabetes mellitus without complications: Secondary | ICD-10-CM

## 2022-02-16 DIAGNOSIS — Z7982 Long term (current) use of aspirin: Secondary | ICD-10-CM | POA: Diagnosis not present

## 2022-02-16 DIAGNOSIS — K219 Gastro-esophageal reflux disease without esophagitis: Secondary | ICD-10-CM | POA: Diagnosis not present

## 2022-02-16 DIAGNOSIS — G6181 Chronic inflammatory demyelinating polyneuritis: Secondary | ICD-10-CM | POA: Diagnosis not present

## 2022-02-16 DIAGNOSIS — E7800 Pure hypercholesterolemia, unspecified: Secondary | ICD-10-CM | POA: Diagnosis not present

## 2022-02-16 DIAGNOSIS — S90411D Abrasion, right great toe, subsequent encounter: Secondary | ICD-10-CM | POA: Diagnosis not present

## 2022-02-16 DIAGNOSIS — Z993 Dependence on wheelchair: Secondary | ICD-10-CM | POA: Diagnosis not present

## 2022-02-16 MED ORDER — HYDROCHLOROTHIAZIDE 12.5 MG PO CAPS
12.5000 mg | ORAL_CAPSULE | Freq: Every day | ORAL | 1 refills | Status: DC
Start: 2022-02-16 — End: 2022-05-28

## 2022-02-16 MED ORDER — SITAGLIPTIN PHOSPHATE 100 MG PO TABS: 100.0000 mg | ORAL_TABLET | Freq: Every day | ORAL | 1 refills | Status: DC

## 2022-02-16 NOTE — Assessment & Plan Note (Signed)
Poorly controlled.  He had DKA in the setting of Jardiance use.  We will increase his Januvia to 100 mg daily.  We will check labs in 1 month. ?

## 2022-02-16 NOTE — Assessment & Plan Note (Signed)
Improving.  He can continue Wellbutrin 300 mg daily. ?

## 2022-02-16 NOTE — Progress Notes (Signed)
? ?Virtual Visit via video Note ? ?This visit type was conducted due to national recommendations for restrictions regarding the COVID-19 pandemic (e.g. social distancing).  This format is felt to be most appropriate for this patient at this time.  All issues noted in this document were discussed and addressed.  No physical exam was performed (except for noted visual exam findings with Video Visits).  ? ?I connected with Michael Montgomery today at  4:30 PM EDT by a video enabled telemedicine application and verified that I am speaking with the correct person using two identifiers. ?Location patient: home ?Location provider: work ?Persons participating in the virtual visit: patient, provider ? ?I discussed the limitations, risks, security and privacy concerns of performing an evaluation and management service by telephone and the availability of in person appointments. I also discussed with the patient that there may be a patient responsible charge related to this service. The patient expressed understanding and agreed to proceed. ? ?Reason for visit: f/u. ? ?HPI: ?DIABETES ?Disease Monitoring: ?Blood Sugar ranges-<200 Polyuria/phagia/dipsia- no      Optho- scheduled ?Medications: ?Compliance- taking januvia 50 mg daily Hypoglycemic symptoms- no ? ?HYPERTENSION ?Disease Monitoring ?Home BP Monitoring 170s/80s Chest pain- no    Dyspnea- no ?Medications ?Compliance-  taking amlodipine.   Edema- mild LE, resolves intermittently ?BMET ?   ?Component Value Date/Time  ? NA 139 12/25/2021 0432  ? K 3.2 (L) 12/25/2021 0432  ? CL 113 (H) 12/25/2021 0432  ? CO2 19 (L) 12/25/2021 0432  ? GLUCOSE 118 (H) 12/25/2021 0432  ? BUN 6 12/25/2021 0432  ? CREATININE 0.38 (L) 12/25/2021 0432  ? CREATININE 0.55 (L) 07/12/2019 1550  ? CALCIUM 8.1 (L) 12/25/2021 0432  ? GFRNONAA >60 12/25/2021 0432  ? GFRAA >60 10/10/2019 1746  ? ?CIDP: Patient continues to work with physical therapy.  He feels as though his legs and arms are getting  stronger.  He continues to see neurology and continues on his infusions.  He does note some intermittent depression and notes neurology started him on Wellbutrin and tapered him off of Zoloft.  He notes the Wellbutrin has been helpful. ? ?Hammertoes: He has seen podiatry.  He wonders if this can be fixed surgically. ? ? ? ?ROS: See pertinent positives and negatives per HPI. ? ?Past Medical History:  ?Diagnosis Date  ? Cataract march, april  ? bilateral removed   ? Chickenpox   ? Diabetes mellitus without complication ([place])   ? metformin   ? 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 sees dr at [facility]  ? Gastric ulcer   ? Heart murmur   ? When he was younger, no recent issues  ? High blood pressure   ? controlled with meds  ? Hypercholesteremia   ? controlled with medication  ? Knee injury   ? left  ? Motion sickness   ? boats  ? Neuropathy   ? ? ?Past Surgical History:  ?Procedure Laterality Date  ? CATARACT EXTRACTION W/PHACO Right 02/01/2016  ? Procedure: CATARACT EXTRACTION PHACO AND INTRAOCULAR LENS PLACEMENT ([place]) right;  Surgeon: Ronnell Freshwater, MD;  Location: [place];  Service: Ophthalmology;  Laterality: Right;  DIABETIC - oral meds  ? CATARACT EXTRACTION W/PHACO Left 03/07/2016  ? Procedure: CATARACT EXTRACTION PHACO AND INTRAOCULAR LENS PLACEMENT ([facility]);  Surgeon: Ronnell Freshwater, MD;  Location: [place];  Service: Ophthalmology;  Laterality: Left;  DIABETIC - oral meds ?  ? circumscision    ? ? ?Family History  ?Problem  Relation Age of Onset  ? Alcoholism Father   ? Hyperlipidemia Father   ? Hypertension Father   ? Throat cancer Father   ?     smoked but had stopped 20 yeras before dx  ? Diabetes Mother   ? Emphysema Mother   ? Kidney disease Neg Hx   ? Prostate cancer Neg Hx   ? Colon cancer Neg Hx   ? Colon polyps Neg Hx   ? Esophageal cancer Neg Hx   ? Rectal cancer Neg Hx   ? Stomach cancer Neg Hx   ? Kidney cancer Neg Hx   ? Bladder Cancer Neg Hx   ? ? ?SOCIAL HX: Intermittent  smoker ? ? ?Current Outpatient Medications:  ?  Accu-Chek Softclix Lancets lancets, 1 each by Other route daily. Check sugar fasting daily. Dx Code E11.9., Disp: 100 each, Rfl: 3 ?  amLODipine (NORVASC) 10 MG tablet, TAKE 1 TABLET BY MOUTH  DAILY (Patient taking differently: Take 10 mg by mouth daily.), Disp: 90 tablet, Rfl: 3 ?  aspirin EC 81 MG tablet, Take 81 mg by mouth daily. Swallow whole., Disp: , Rfl:  ?  atorvastatin (LIPITOR) 40 MG tablet, TAKE 1 TABLET BY MOUTH EVERY DAY, Disp: 90 tablet, Rfl: 1 ?  Blood Glucose Monitoring Suppl (ACCU-CHEK GUIDE ME) w/Device KIT, USE ONCE DAILY FASTING. DX CODE E11.9., Disp: 1 kit, Rfl: 0 ?  buPROPion (WELLBUTRIN XL) 300 MG 24 hr tablet, Take by mouth., Disp: , Rfl:  ?  fluticasone (FLONASE) 50 MCG/ACT nasal spray, Place into the nose., Disp: , Rfl:  ?  gabapentin (NEURONTIN) 100 MG capsule, Take 200 mg by mouth 3 (three) times daily., Disp: , Rfl:  ?  glucose blood test strip, Use once daily fasting. Dx Code E11.9, Disp: 100 each, Rfl: 12 ?  hydrochlorothiazide (MICROZIDE) 12.5 MG capsule, Take 1 capsule (12.5 mg total) by mouth daily., Disp: 90 capsule, Rfl: 1 ?  LINZESS 72 MCG capsule, TAKE 1 CAPSULE BY MOUTH EVERY DAY BEFORE BREAKFAST (Patient taking differently: Take 72 mcg by mouth daily before breakfast.), Disp: 30 capsule, Rfl: 1 ?  Multiple Vitamin (MULTIVITAMIN) capsule, Take 1 capsule by mouth daily., Disp: , Rfl:  ?  Nutritional Supplements (ENSURE ACTIVE HIGH PROTEIN) LIQD, Take 1 Can by mouth 3 (three) times daily as needed., Disp: 21330 mL, Rfl: 11 ?  Omega-3 Fatty Acids (FISH OIL PO), Take 1 capsule by mouth daily. , Disp: , Rfl:  ?  omeprazole (PRILOSEC) 20 MG capsule, TAKE 1 CAPSULE BY MOUTH EVERY DAY (Patient taking differently: Take 20 mg by mouth daily.), Disp: 90 capsule, Rfl: 1 ?  sildenafil (VIAGRA) 50 MG tablet, Take 1 tablet (50 mg total) by mouth as needed for erectile dysfunction., Disp: 10 tablet, Rfl: 0 ?  sodium chloride 0.9 % infusion,  Inject into the vein., Disp: , Rfl:  ?  sitaGLIPtin (JANUVIA) 100 MG tablet, Take 1 tablet (100 mg total) by mouth daily., Disp: 90 tablet, Rfl: 1 ? ?EXAM: ? ?VITALS per patient if applicable: ? ?GENERAL: alert, oriented, appears well and in no acute distress ? ?HEENT: atraumatic, conjunttiva clear, no obvious abnormalities on inspection of external nose and ears ? ?NECK: normal movements of the head and neck ? ?LUNGS: on inspection no signs of respiratory distress, breathing rate appears normal, no obvious gross SOB, gasping or wheezing ? ?CV: no obvious cyanosis ? ?MS: moves all visible extremities without noticeable abnormality ? ?PSYCH/NEURO: pleasant and cooperative, no obvious depression or anxiety, speech  and thought processing grossly intact ? ?ASSESSMENT AND PLAN: ? ?Discussed the following assessment and plan: ? ?Problem List Items Addressed This Visit   ? ? CIDP (chronic inflammatory demyelinating polyneuropathy) (HCC)  ?  Chronic issue.  Patient feels as though his strength is improving.  He will continue management through neurology. ? ?  ?  ? Relevant Medications  ? buPROPion (WELLBUTRIN XL) 300 MG 24 hr tablet  ? Depression, major, recurrent, mild ([place])  ?  Improving.  He can continue Wellbutrin 300 mg daily. ? ?  ?  ? Relevant Medications  ? buPROPion (WELLBUTRIN XL) 300 MG 24 hr tablet  ? Diabetes ([place])  ?  Poorly controlled.  He had DKA in the setting of Jardiance use.  We will increase his Januvia to 100 mg daily.  We will check labs in 1 month. ? ?  ?  ? Relevant Medications  ? sitaGLIPtin (JANUVIA) 100 MG tablet  ? Essential hypertension  ?  Uncontrolled.  We will add hydrochlorothiazide 12.5 mg once daily.  He will follow-up with me in 1 month and have lab work at that time.  He will continue amlodipine 10 mg once daily.  Discussed that the amlodipine could be contributing to his edema. ? ?  ?  ? Relevant Medications  ? hydrochlorothiazide (MICROZIDE) 12.5 MG capsule  ? Hammertoe  ?  He can  continue to see podiatry.  Discussed that surgery should be a last option for the hammertoe given that observationally it seems as the surgery for hammertoes seems to be hit or miss on effectiveness for any symptom

## 2022-02-16 NOTE — Assessment & Plan Note (Signed)
Uncontrolled.  We will add hydrochlorothiazide 12.5 mg once daily.  He will follow-up with me in 1 month and have lab work at that time.  He will continue amlodipine 10 mg once daily.  Discussed that the amlodipine could be contributing to his edema. ?

## 2022-02-16 NOTE — Assessment & Plan Note (Signed)
Chronic issue.  Patient feels as though his strength is improving.  He will continue management through neurology. ?

## 2022-02-16 NOTE — Assessment & Plan Note (Signed)
He can continue to see podiatry.  Discussed that surgery should be a last option for the hammertoe given that observationally it seems as the surgery for hammertoes seems to be hit or miss on effectiveness for any symptoms related to the hammertoes. ?

## 2022-02-18 ENCOUNTER — Inpatient Hospital Stay: Payer: Medicare Other | Admitting: Family Medicine

## 2022-02-23 DIAGNOSIS — K5904 Chronic idiopathic constipation: Secondary | ICD-10-CM | POA: Diagnosis not present

## 2022-02-23 DIAGNOSIS — Z9181 History of falling: Secondary | ICD-10-CM | POA: Diagnosis not present

## 2022-02-23 DIAGNOSIS — E1140 Type 2 diabetes mellitus with diabetic neuropathy, unspecified: Secondary | ICD-10-CM | POA: Diagnosis not present

## 2022-02-23 DIAGNOSIS — W228XXD Striking against or struck by other objects, subsequent encounter: Secondary | ICD-10-CM | POA: Diagnosis not present

## 2022-02-23 DIAGNOSIS — E1144 Type 2 diabetes mellitus with diabetic amyotrophy: Secondary | ICD-10-CM | POA: Diagnosis not present

## 2022-02-23 DIAGNOSIS — Z7984 Long term (current) use of oral hypoglycemic drugs: Secondary | ICD-10-CM | POA: Diagnosis not present

## 2022-02-23 DIAGNOSIS — I10 Essential (primary) hypertension: Secondary | ICD-10-CM | POA: Diagnosis not present

## 2022-02-23 DIAGNOSIS — Z87891 Personal history of nicotine dependence: Secondary | ICD-10-CM | POA: Diagnosis not present

## 2022-02-23 DIAGNOSIS — E7800 Pure hypercholesterolemia, unspecified: Secondary | ICD-10-CM | POA: Diagnosis not present

## 2022-02-23 DIAGNOSIS — A419 Sepsis, unspecified organism: Secondary | ICD-10-CM | POA: Diagnosis not present

## 2022-02-23 DIAGNOSIS — G6181 Chronic inflammatory demyelinating polyneuritis: Secondary | ICD-10-CM | POA: Diagnosis not present

## 2022-02-23 DIAGNOSIS — S90411D Abrasion, right great toe, subsequent encounter: Secondary | ICD-10-CM | POA: Diagnosis not present

## 2022-02-23 DIAGNOSIS — Z7982 Long term (current) use of aspirin: Secondary | ICD-10-CM | POA: Diagnosis not present

## 2022-02-23 DIAGNOSIS — K259 Gastric ulcer, unspecified as acute or chronic, without hemorrhage or perforation: Secondary | ICD-10-CM | POA: Diagnosis not present

## 2022-02-23 DIAGNOSIS — K219 Gastro-esophageal reflux disease without esophagitis: Secondary | ICD-10-CM | POA: Diagnosis not present

## 2022-02-23 DIAGNOSIS — L03031 Cellulitis of right toe: Secondary | ICD-10-CM | POA: Diagnosis not present

## 2022-02-23 DIAGNOSIS — Z993 Dependence on wheelchair: Secondary | ICD-10-CM | POA: Diagnosis not present

## 2022-02-24 DIAGNOSIS — S90411D Abrasion, right great toe, subsequent encounter: Secondary | ICD-10-CM | POA: Diagnosis not present

## 2022-02-24 DIAGNOSIS — E1140 Type 2 diabetes mellitus with diabetic neuropathy, unspecified: Secondary | ICD-10-CM | POA: Diagnosis not present

## 2022-02-24 DIAGNOSIS — Z7982 Long term (current) use of aspirin: Secondary | ICD-10-CM | POA: Diagnosis not present

## 2022-02-24 DIAGNOSIS — W228XXD Striking against or struck by other objects, subsequent encounter: Secondary | ICD-10-CM | POA: Diagnosis not present

## 2022-02-24 DIAGNOSIS — Z87891 Personal history of nicotine dependence: Secondary | ICD-10-CM | POA: Diagnosis not present

## 2022-02-24 DIAGNOSIS — K5904 Chronic idiopathic constipation: Secondary | ICD-10-CM | POA: Diagnosis not present

## 2022-02-24 DIAGNOSIS — E1144 Type 2 diabetes mellitus with diabetic amyotrophy: Secondary | ICD-10-CM | POA: Diagnosis not present

## 2022-02-24 DIAGNOSIS — I10 Essential (primary) hypertension: Secondary | ICD-10-CM | POA: Diagnosis not present

## 2022-02-24 DIAGNOSIS — K259 Gastric ulcer, unspecified as acute or chronic, without hemorrhage or perforation: Secondary | ICD-10-CM | POA: Diagnosis not present

## 2022-02-24 DIAGNOSIS — Z9181 History of falling: Secondary | ICD-10-CM | POA: Diagnosis not present

## 2022-02-24 DIAGNOSIS — L03031 Cellulitis of right toe: Secondary | ICD-10-CM | POA: Diagnosis not present

## 2022-02-24 DIAGNOSIS — K219 Gastro-esophageal reflux disease without esophagitis: Secondary | ICD-10-CM | POA: Diagnosis not present

## 2022-02-24 DIAGNOSIS — Z7984 Long term (current) use of oral hypoglycemic drugs: Secondary | ICD-10-CM | POA: Diagnosis not present

## 2022-02-24 DIAGNOSIS — Z993 Dependence on wheelchair: Secondary | ICD-10-CM | POA: Diagnosis not present

## 2022-02-24 DIAGNOSIS — G6181 Chronic inflammatory demyelinating polyneuritis: Secondary | ICD-10-CM | POA: Diagnosis not present

## 2022-02-24 DIAGNOSIS — E7800 Pure hypercholesterolemia, unspecified: Secondary | ICD-10-CM | POA: Diagnosis not present

## 2022-02-24 DIAGNOSIS — A419 Sepsis, unspecified organism: Secondary | ICD-10-CM | POA: Diagnosis not present

## 2022-03-01 DIAGNOSIS — S90411D Abrasion, right great toe, subsequent encounter: Secondary | ICD-10-CM | POA: Diagnosis not present

## 2022-03-01 DIAGNOSIS — E7800 Pure hypercholesterolemia, unspecified: Secondary | ICD-10-CM | POA: Diagnosis not present

## 2022-03-01 DIAGNOSIS — Z9181 History of falling: Secondary | ICD-10-CM | POA: Diagnosis not present

## 2022-03-01 DIAGNOSIS — Z993 Dependence on wheelchair: Secondary | ICD-10-CM | POA: Diagnosis not present

## 2022-03-01 DIAGNOSIS — K219 Gastro-esophageal reflux disease without esophagitis: Secondary | ICD-10-CM | POA: Diagnosis not present

## 2022-03-01 DIAGNOSIS — Z87891 Personal history of nicotine dependence: Secondary | ICD-10-CM | POA: Diagnosis not present

## 2022-03-01 DIAGNOSIS — E1144 Type 2 diabetes mellitus with diabetic amyotrophy: Secondary | ICD-10-CM | POA: Diagnosis not present

## 2022-03-01 DIAGNOSIS — G6181 Chronic inflammatory demyelinating polyneuritis: Secondary | ICD-10-CM | POA: Diagnosis not present

## 2022-03-01 DIAGNOSIS — L03031 Cellulitis of right toe: Secondary | ICD-10-CM | POA: Diagnosis not present

## 2022-03-01 DIAGNOSIS — A419 Sepsis, unspecified organism: Secondary | ICD-10-CM | POA: Diagnosis not present

## 2022-03-01 DIAGNOSIS — W228XXD Striking against or struck by other objects, subsequent encounter: Secondary | ICD-10-CM | POA: Diagnosis not present

## 2022-03-01 DIAGNOSIS — Z7984 Long term (current) use of oral hypoglycemic drugs: Secondary | ICD-10-CM | POA: Diagnosis not present

## 2022-03-01 DIAGNOSIS — K259 Gastric ulcer, unspecified as acute or chronic, without hemorrhage or perforation: Secondary | ICD-10-CM | POA: Diagnosis not present

## 2022-03-01 DIAGNOSIS — I10 Essential (primary) hypertension: Secondary | ICD-10-CM | POA: Diagnosis not present

## 2022-03-01 DIAGNOSIS — E1140 Type 2 diabetes mellitus with diabetic neuropathy, unspecified: Secondary | ICD-10-CM | POA: Diagnosis not present

## 2022-03-01 DIAGNOSIS — Z7982 Long term (current) use of aspirin: Secondary | ICD-10-CM | POA: Diagnosis not present

## 2022-03-01 DIAGNOSIS — K5904 Chronic idiopathic constipation: Secondary | ICD-10-CM | POA: Diagnosis not present

## 2022-03-04 DIAGNOSIS — E7800 Pure hypercholesterolemia, unspecified: Secondary | ICD-10-CM | POA: Diagnosis not present

## 2022-03-04 DIAGNOSIS — K5904 Chronic idiopathic constipation: Secondary | ICD-10-CM | POA: Diagnosis not present

## 2022-03-04 DIAGNOSIS — Z7982 Long term (current) use of aspirin: Secondary | ICD-10-CM | POA: Diagnosis not present

## 2022-03-04 DIAGNOSIS — S90411D Abrasion, right great toe, subsequent encounter: Secondary | ICD-10-CM | POA: Diagnosis not present

## 2022-03-04 DIAGNOSIS — Z993 Dependence on wheelchair: Secondary | ICD-10-CM | POA: Diagnosis not present

## 2022-03-04 DIAGNOSIS — Z7984 Long term (current) use of oral hypoglycemic drugs: Secondary | ICD-10-CM | POA: Diagnosis not present

## 2022-03-04 DIAGNOSIS — E1144 Type 2 diabetes mellitus with diabetic amyotrophy: Secondary | ICD-10-CM | POA: Diagnosis not present

## 2022-03-04 DIAGNOSIS — Z87891 Personal history of nicotine dependence: Secondary | ICD-10-CM | POA: Diagnosis not present

## 2022-03-04 DIAGNOSIS — Z9181 History of falling: Secondary | ICD-10-CM | POA: Diagnosis not present

## 2022-03-04 DIAGNOSIS — K219 Gastro-esophageal reflux disease without esophagitis: Secondary | ICD-10-CM | POA: Diagnosis not present

## 2022-03-04 DIAGNOSIS — A419 Sepsis, unspecified organism: Secondary | ICD-10-CM | POA: Diagnosis not present

## 2022-03-04 DIAGNOSIS — I10 Essential (primary) hypertension: Secondary | ICD-10-CM | POA: Diagnosis not present

## 2022-03-04 DIAGNOSIS — E1140 Type 2 diabetes mellitus with diabetic neuropathy, unspecified: Secondary | ICD-10-CM | POA: Diagnosis not present

## 2022-03-04 DIAGNOSIS — G6181 Chronic inflammatory demyelinating polyneuritis: Secondary | ICD-10-CM | POA: Diagnosis not present

## 2022-03-04 DIAGNOSIS — K259 Gastric ulcer, unspecified as acute or chronic, without hemorrhage or perforation: Secondary | ICD-10-CM | POA: Diagnosis not present

## 2022-03-04 DIAGNOSIS — W228XXD Striking against or struck by other objects, subsequent encounter: Secondary | ICD-10-CM | POA: Diagnosis not present

## 2022-03-04 DIAGNOSIS — L03031 Cellulitis of right toe: Secondary | ICD-10-CM | POA: Diagnosis not present

## 2022-03-07 ENCOUNTER — Telehealth: Payer: Self-pay | Admitting: Family Medicine

## 2022-03-07 LAB — HEMOGLOBIN A1C: Hemoglobin A1C: 6.3

## 2022-03-07 LAB — HM DIABETES FOOT EXAM: HM Diabetic Foot Exam: NORMAL

## 2022-03-07 NOTE — Telephone Encounter (Signed)
I called and spoke with the patient and informed him that the provider wants him to stop the Linzess and he understood.  Tomi Grandpre,cma  ?

## 2022-03-07 NOTE — Telephone Encounter (Signed)
PAD Left foot 0.91 border line ?Right foot 0.86 Mild ?Anything lower than 0.9 they have to call.  ?The provider will receive a report in 2 to 4 weeks.  ?

## 2022-03-07 NOTE — Telephone Encounter (Signed)
Pt is taking linzess and it is giving him diarrhea and pt want to know is there something fiber related that he can put in his drink. ? ?

## 2022-03-07 NOTE — Telephone Encounter (Signed)
Noted.  If he is having diarrhea I would suggest he stop the Linzess. ?

## 2022-03-07 NOTE — Telephone Encounter (Signed)
Noted.  I will discuss this with the patient at his upcoming visit. ?

## 2022-03-08 DIAGNOSIS — S90411D Abrasion, right great toe, subsequent encounter: Secondary | ICD-10-CM | POA: Diagnosis not present

## 2022-03-08 DIAGNOSIS — K5904 Chronic idiopathic constipation: Secondary | ICD-10-CM | POA: Diagnosis not present

## 2022-03-08 DIAGNOSIS — Z7984 Long term (current) use of oral hypoglycemic drugs: Secondary | ICD-10-CM | POA: Diagnosis not present

## 2022-03-08 DIAGNOSIS — E1144 Type 2 diabetes mellitus with diabetic amyotrophy: Secondary | ICD-10-CM | POA: Diagnosis not present

## 2022-03-08 DIAGNOSIS — W228XXD Striking against or struck by other objects, subsequent encounter: Secondary | ICD-10-CM | POA: Diagnosis not present

## 2022-03-08 DIAGNOSIS — Z87891 Personal history of nicotine dependence: Secondary | ICD-10-CM | POA: Diagnosis not present

## 2022-03-08 DIAGNOSIS — K219 Gastro-esophageal reflux disease without esophagitis: Secondary | ICD-10-CM | POA: Diagnosis not present

## 2022-03-08 DIAGNOSIS — E7800 Pure hypercholesterolemia, unspecified: Secondary | ICD-10-CM | POA: Diagnosis not present

## 2022-03-08 DIAGNOSIS — E1140 Type 2 diabetes mellitus with diabetic neuropathy, unspecified: Secondary | ICD-10-CM | POA: Diagnosis not present

## 2022-03-08 DIAGNOSIS — K259 Gastric ulcer, unspecified as acute or chronic, without hemorrhage or perforation: Secondary | ICD-10-CM | POA: Diagnosis not present

## 2022-03-08 DIAGNOSIS — I10 Essential (primary) hypertension: Secondary | ICD-10-CM | POA: Diagnosis not present

## 2022-03-08 DIAGNOSIS — G6181 Chronic inflammatory demyelinating polyneuritis: Secondary | ICD-10-CM | POA: Diagnosis not present

## 2022-03-08 DIAGNOSIS — Z993 Dependence on wheelchair: Secondary | ICD-10-CM | POA: Diagnosis not present

## 2022-03-08 DIAGNOSIS — Z7982 Long term (current) use of aspirin: Secondary | ICD-10-CM | POA: Diagnosis not present

## 2022-03-08 DIAGNOSIS — Z9181 History of falling: Secondary | ICD-10-CM | POA: Diagnosis not present

## 2022-03-08 DIAGNOSIS — L03031 Cellulitis of right toe: Secondary | ICD-10-CM | POA: Diagnosis not present

## 2022-03-08 DIAGNOSIS — A419 Sepsis, unspecified organism: Secondary | ICD-10-CM | POA: Diagnosis not present

## 2022-03-09 ENCOUNTER — Telehealth: Payer: Medicare Other | Admitting: Family Medicine

## 2022-03-11 DIAGNOSIS — K5904 Chronic idiopathic constipation: Secondary | ICD-10-CM | POA: Diagnosis not present

## 2022-03-11 DIAGNOSIS — Z87891 Personal history of nicotine dependence: Secondary | ICD-10-CM | POA: Diagnosis not present

## 2022-03-11 DIAGNOSIS — G6181 Chronic inflammatory demyelinating polyneuritis: Secondary | ICD-10-CM | POA: Diagnosis not present

## 2022-03-11 DIAGNOSIS — Z993 Dependence on wheelchair: Secondary | ICD-10-CM | POA: Diagnosis not present

## 2022-03-11 DIAGNOSIS — E1144 Type 2 diabetes mellitus with diabetic amyotrophy: Secondary | ICD-10-CM | POA: Diagnosis not present

## 2022-03-11 DIAGNOSIS — W228XXD Striking against or struck by other objects, subsequent encounter: Secondary | ICD-10-CM | POA: Diagnosis not present

## 2022-03-11 DIAGNOSIS — S90411D Abrasion, right great toe, subsequent encounter: Secondary | ICD-10-CM | POA: Diagnosis not present

## 2022-03-11 DIAGNOSIS — K219 Gastro-esophageal reflux disease without esophagitis: Secondary | ICD-10-CM | POA: Diagnosis not present

## 2022-03-11 DIAGNOSIS — E7800 Pure hypercholesterolemia, unspecified: Secondary | ICD-10-CM | POA: Diagnosis not present

## 2022-03-11 DIAGNOSIS — Z7982 Long term (current) use of aspirin: Secondary | ICD-10-CM | POA: Diagnosis not present

## 2022-03-11 DIAGNOSIS — I10 Essential (primary) hypertension: Secondary | ICD-10-CM | POA: Diagnosis not present

## 2022-03-11 DIAGNOSIS — L03031 Cellulitis of right toe: Secondary | ICD-10-CM | POA: Diagnosis not present

## 2022-03-11 DIAGNOSIS — K259 Gastric ulcer, unspecified as acute or chronic, without hemorrhage or perforation: Secondary | ICD-10-CM | POA: Diagnosis not present

## 2022-03-11 DIAGNOSIS — Z9181 History of falling: Secondary | ICD-10-CM | POA: Diagnosis not present

## 2022-03-11 DIAGNOSIS — Z7984 Long term (current) use of oral hypoglycemic drugs: Secondary | ICD-10-CM | POA: Diagnosis not present

## 2022-03-11 DIAGNOSIS — E1140 Type 2 diabetes mellitus with diabetic neuropathy, unspecified: Secondary | ICD-10-CM | POA: Diagnosis not present

## 2022-03-11 DIAGNOSIS — A419 Sepsis, unspecified organism: Secondary | ICD-10-CM | POA: Diagnosis not present

## 2022-03-15 DIAGNOSIS — Z7982 Long term (current) use of aspirin: Secondary | ICD-10-CM | POA: Diagnosis not present

## 2022-03-15 DIAGNOSIS — Z79899 Other long term (current) drug therapy: Secondary | ICD-10-CM | POA: Diagnosis not present

## 2022-03-15 DIAGNOSIS — E1144 Type 2 diabetes mellitus with diabetic amyotrophy: Secondary | ICD-10-CM | POA: Diagnosis not present

## 2022-03-15 DIAGNOSIS — Z7984 Long term (current) use of oral hypoglycemic drugs: Secondary | ICD-10-CM | POA: Diagnosis not present

## 2022-03-15 DIAGNOSIS — E1140 Type 2 diabetes mellitus with diabetic neuropathy, unspecified: Secondary | ICD-10-CM | POA: Diagnosis not present

## 2022-03-15 DIAGNOSIS — K259 Gastric ulcer, unspecified as acute or chronic, without hemorrhage or perforation: Secondary | ICD-10-CM | POA: Diagnosis not present

## 2022-03-15 DIAGNOSIS — A419 Sepsis, unspecified organism: Secondary | ICD-10-CM | POA: Diagnosis not present

## 2022-03-15 DIAGNOSIS — L03031 Cellulitis of right toe: Secondary | ICD-10-CM | POA: Diagnosis not present

## 2022-03-15 DIAGNOSIS — Z87891 Personal history of nicotine dependence: Secondary | ICD-10-CM | POA: Diagnosis not present

## 2022-03-15 DIAGNOSIS — E7800 Pure hypercholesterolemia, unspecified: Secondary | ICD-10-CM | POA: Diagnosis not present

## 2022-03-15 DIAGNOSIS — Z9181 History of falling: Secondary | ICD-10-CM | POA: Diagnosis not present

## 2022-03-15 DIAGNOSIS — K219 Gastro-esophageal reflux disease without esophagitis: Secondary | ICD-10-CM | POA: Diagnosis not present

## 2022-03-15 DIAGNOSIS — G6181 Chronic inflammatory demyelinating polyneuritis: Secondary | ICD-10-CM | POA: Diagnosis not present

## 2022-03-15 DIAGNOSIS — W228XXD Striking against or struck by other objects, subsequent encounter: Secondary | ICD-10-CM | POA: Diagnosis not present

## 2022-03-15 DIAGNOSIS — I10 Essential (primary) hypertension: Secondary | ICD-10-CM | POA: Diagnosis not present

## 2022-03-15 DIAGNOSIS — Z993 Dependence on wheelchair: Secondary | ICD-10-CM | POA: Diagnosis not present

## 2022-03-15 DIAGNOSIS — K5904 Chronic idiopathic constipation: Secondary | ICD-10-CM | POA: Diagnosis not present

## 2022-03-15 DIAGNOSIS — S90411D Abrasion, right great toe, subsequent encounter: Secondary | ICD-10-CM | POA: Diagnosis not present

## 2022-03-17 DIAGNOSIS — K259 Gastric ulcer, unspecified as acute or chronic, without hemorrhage or perforation: Secondary | ICD-10-CM | POA: Diagnosis not present

## 2022-03-17 DIAGNOSIS — I10 Essential (primary) hypertension: Secondary | ICD-10-CM | POA: Diagnosis not present

## 2022-03-17 DIAGNOSIS — Z9181 History of falling: Secondary | ICD-10-CM | POA: Diagnosis not present

## 2022-03-17 DIAGNOSIS — K5904 Chronic idiopathic constipation: Secondary | ICD-10-CM | POA: Diagnosis not present

## 2022-03-17 DIAGNOSIS — Z7984 Long term (current) use of oral hypoglycemic drugs: Secondary | ICD-10-CM | POA: Diagnosis not present

## 2022-03-17 DIAGNOSIS — Z993 Dependence on wheelchair: Secondary | ICD-10-CM | POA: Diagnosis not present

## 2022-03-17 DIAGNOSIS — Z87891 Personal history of nicotine dependence: Secondary | ICD-10-CM | POA: Diagnosis not present

## 2022-03-17 DIAGNOSIS — E1144 Type 2 diabetes mellitus with diabetic amyotrophy: Secondary | ICD-10-CM | POA: Diagnosis not present

## 2022-03-17 DIAGNOSIS — Z7982 Long term (current) use of aspirin: Secondary | ICD-10-CM | POA: Diagnosis not present

## 2022-03-17 DIAGNOSIS — Z79899 Other long term (current) drug therapy: Secondary | ICD-10-CM | POA: Diagnosis not present

## 2022-03-17 DIAGNOSIS — E7800 Pure hypercholesterolemia, unspecified: Secondary | ICD-10-CM | POA: Diagnosis not present

## 2022-03-17 DIAGNOSIS — W228XXD Striking against or struck by other objects, subsequent encounter: Secondary | ICD-10-CM | POA: Diagnosis not present

## 2022-03-17 DIAGNOSIS — L03031 Cellulitis of right toe: Secondary | ICD-10-CM | POA: Diagnosis not present

## 2022-03-17 DIAGNOSIS — K219 Gastro-esophageal reflux disease without esophagitis: Secondary | ICD-10-CM | POA: Diagnosis not present

## 2022-03-17 DIAGNOSIS — G6181 Chronic inflammatory demyelinating polyneuritis: Secondary | ICD-10-CM | POA: Diagnosis not present

## 2022-03-17 DIAGNOSIS — S91102D Unspecified open wound of left great toe without damage to nail, subsequent encounter: Secondary | ICD-10-CM | POA: Diagnosis not present

## 2022-03-17 DIAGNOSIS — E1140 Type 2 diabetes mellitus with diabetic neuropathy, unspecified: Secondary | ICD-10-CM | POA: Diagnosis not present

## 2022-03-22 DIAGNOSIS — E119 Type 2 diabetes mellitus without complications: Secondary | ICD-10-CM | POA: Diagnosis not present

## 2022-03-22 DIAGNOSIS — H5213 Myopia, bilateral: Secondary | ICD-10-CM | POA: Diagnosis not present

## 2022-03-22 LAB — HM DIABETES EYE EXAM

## 2022-03-23 ENCOUNTER — Other Ambulatory Visit: Payer: Self-pay

## 2022-03-23 ENCOUNTER — Telehealth: Payer: Self-pay | Admitting: Family Medicine

## 2022-03-23 DIAGNOSIS — K259 Gastric ulcer, unspecified as acute or chronic, without hemorrhage or perforation: Secondary | ICD-10-CM | POA: Diagnosis not present

## 2022-03-23 DIAGNOSIS — E7800 Pure hypercholesterolemia, unspecified: Secondary | ICD-10-CM | POA: Diagnosis not present

## 2022-03-23 DIAGNOSIS — G6181 Chronic inflammatory demyelinating polyneuritis: Secondary | ICD-10-CM | POA: Diagnosis not present

## 2022-03-23 DIAGNOSIS — K5904 Chronic idiopathic constipation: Secondary | ICD-10-CM | POA: Diagnosis not present

## 2022-03-23 DIAGNOSIS — L03031 Cellulitis of right toe: Secondary | ICD-10-CM | POA: Diagnosis not present

## 2022-03-23 DIAGNOSIS — Z9181 History of falling: Secondary | ICD-10-CM | POA: Diagnosis not present

## 2022-03-23 DIAGNOSIS — E1140 Type 2 diabetes mellitus with diabetic neuropathy, unspecified: Secondary | ICD-10-CM | POA: Diagnosis not present

## 2022-03-23 DIAGNOSIS — Z7984 Long term (current) use of oral hypoglycemic drugs: Secondary | ICD-10-CM | POA: Diagnosis not present

## 2022-03-23 DIAGNOSIS — Z7982 Long term (current) use of aspirin: Secondary | ICD-10-CM | POA: Diagnosis not present

## 2022-03-23 DIAGNOSIS — W228XXD Striking against or struck by other objects, subsequent encounter: Secondary | ICD-10-CM | POA: Diagnosis not present

## 2022-03-23 DIAGNOSIS — Z79899 Other long term (current) drug therapy: Secondary | ICD-10-CM | POA: Diagnosis not present

## 2022-03-23 DIAGNOSIS — S91102D Unspecified open wound of left great toe without damage to nail, subsequent encounter: Secondary | ICD-10-CM | POA: Diagnosis not present

## 2022-03-23 DIAGNOSIS — Z993 Dependence on wheelchair: Secondary | ICD-10-CM | POA: Diagnosis not present

## 2022-03-23 DIAGNOSIS — E1144 Type 2 diabetes mellitus with diabetic amyotrophy: Secondary | ICD-10-CM | POA: Diagnosis not present

## 2022-03-23 DIAGNOSIS — I10 Essential (primary) hypertension: Secondary | ICD-10-CM | POA: Diagnosis not present

## 2022-03-23 DIAGNOSIS — K219 Gastro-esophageal reflux disease without esophagitis: Secondary | ICD-10-CM | POA: Diagnosis not present

## 2022-03-23 DIAGNOSIS — Z87891 Personal history of nicotine dependence: Secondary | ICD-10-CM | POA: Diagnosis not present

## 2022-03-23 NOTE — Telephone Encounter (Signed)
Erica from [facility] called stating she need to go over pt medication with cma

## 2022-03-23 NOTE — Telephone Encounter (Signed)
I called [facility] and spoke with the  nurse and she informed me that the patient is no longer taking the Linzess,omeprazole,sertraline and the Wellbutrin.  Michael Montgomery,cma

## 2022-03-23 NOTE — Telephone Encounter (Signed)
Noted. Can you see why he stopped them? You can also remove them from his medication list.

## 2022-03-24 DIAGNOSIS — Z87891 Personal history of nicotine dependence: Secondary | ICD-10-CM | POA: Diagnosis not present

## 2022-03-24 DIAGNOSIS — E7800 Pure hypercholesterolemia, unspecified: Secondary | ICD-10-CM | POA: Diagnosis not present

## 2022-03-24 DIAGNOSIS — E1144 Type 2 diabetes mellitus with diabetic amyotrophy: Secondary | ICD-10-CM | POA: Diagnosis not present

## 2022-03-24 DIAGNOSIS — K259 Gastric ulcer, unspecified as acute or chronic, without hemorrhage or perforation: Secondary | ICD-10-CM | POA: Diagnosis not present

## 2022-03-24 DIAGNOSIS — L03031 Cellulitis of right toe: Secondary | ICD-10-CM | POA: Diagnosis not present

## 2022-03-24 DIAGNOSIS — Z7982 Long term (current) use of aspirin: Secondary | ICD-10-CM | POA: Diagnosis not present

## 2022-03-24 DIAGNOSIS — S91102D Unspecified open wound of left great toe without damage to nail, subsequent encounter: Secondary | ICD-10-CM | POA: Diagnosis not present

## 2022-03-24 DIAGNOSIS — Z993 Dependence on wheelchair: Secondary | ICD-10-CM | POA: Diagnosis not present

## 2022-03-24 DIAGNOSIS — Z79899 Other long term (current) drug therapy: Secondary | ICD-10-CM | POA: Diagnosis not present

## 2022-03-24 DIAGNOSIS — E1140 Type 2 diabetes mellitus with diabetic neuropathy, unspecified: Secondary | ICD-10-CM | POA: Diagnosis not present

## 2022-03-24 DIAGNOSIS — Z7984 Long term (current) use of oral hypoglycemic drugs: Secondary | ICD-10-CM | POA: Diagnosis not present

## 2022-03-24 DIAGNOSIS — Z9181 History of falling: Secondary | ICD-10-CM | POA: Diagnosis not present

## 2022-03-24 DIAGNOSIS — W228XXD Striking against or struck by other objects, subsequent encounter: Secondary | ICD-10-CM | POA: Diagnosis not present

## 2022-03-24 DIAGNOSIS — G6181 Chronic inflammatory demyelinating polyneuritis: Secondary | ICD-10-CM | POA: Diagnosis not present

## 2022-03-24 DIAGNOSIS — K5904 Chronic idiopathic constipation: Secondary | ICD-10-CM | POA: Diagnosis not present

## 2022-03-24 DIAGNOSIS — I10 Essential (primary) hypertension: Secondary | ICD-10-CM | POA: Diagnosis not present

## 2022-03-24 DIAGNOSIS — K219 Gastro-esophageal reflux disease without esophagitis: Secondary | ICD-10-CM | POA: Diagnosis not present

## 2022-03-28 DIAGNOSIS — G6181 Chronic inflammatory demyelinating polyneuritis: Secondary | ICD-10-CM | POA: Diagnosis not present

## 2022-03-29 DIAGNOSIS — Z7984 Long term (current) use of oral hypoglycemic drugs: Secondary | ICD-10-CM | POA: Diagnosis not present

## 2022-03-29 DIAGNOSIS — G6181 Chronic inflammatory demyelinating polyneuritis: Secondary | ICD-10-CM | POA: Diagnosis not present

## 2022-03-29 DIAGNOSIS — E1144 Type 2 diabetes mellitus with diabetic amyotrophy: Secondary | ICD-10-CM | POA: Diagnosis not present

## 2022-03-29 DIAGNOSIS — K219 Gastro-esophageal reflux disease without esophagitis: Secondary | ICD-10-CM | POA: Diagnosis not present

## 2022-03-29 DIAGNOSIS — E7800 Pure hypercholesterolemia, unspecified: Secondary | ICD-10-CM | POA: Diagnosis not present

## 2022-03-29 DIAGNOSIS — Z79899 Other long term (current) drug therapy: Secondary | ICD-10-CM | POA: Diagnosis not present

## 2022-03-29 DIAGNOSIS — I10 Essential (primary) hypertension: Secondary | ICD-10-CM | POA: Diagnosis not present

## 2022-03-29 DIAGNOSIS — W228XXD Striking against or struck by other objects, subsequent encounter: Secondary | ICD-10-CM | POA: Diagnosis not present

## 2022-03-29 DIAGNOSIS — S91102D Unspecified open wound of left great toe without damage to nail, subsequent encounter: Secondary | ICD-10-CM | POA: Diagnosis not present

## 2022-03-29 DIAGNOSIS — Z9181 History of falling: Secondary | ICD-10-CM | POA: Diagnosis not present

## 2022-03-29 DIAGNOSIS — Z87891 Personal history of nicotine dependence: Secondary | ICD-10-CM | POA: Diagnosis not present

## 2022-03-29 DIAGNOSIS — E1140 Type 2 diabetes mellitus with diabetic neuropathy, unspecified: Secondary | ICD-10-CM | POA: Diagnosis not present

## 2022-03-29 DIAGNOSIS — Z7982 Long term (current) use of aspirin: Secondary | ICD-10-CM | POA: Diagnosis not present

## 2022-03-29 DIAGNOSIS — Z993 Dependence on wheelchair: Secondary | ICD-10-CM | POA: Diagnosis not present

## 2022-03-29 DIAGNOSIS — L03031 Cellulitis of right toe: Secondary | ICD-10-CM | POA: Diagnosis not present

## 2022-03-29 DIAGNOSIS — K5904 Chronic idiopathic constipation: Secondary | ICD-10-CM | POA: Diagnosis not present

## 2022-03-29 DIAGNOSIS — K259 Gastric ulcer, unspecified as acute or chronic, without hemorrhage or perforation: Secondary | ICD-10-CM | POA: Diagnosis not present

## 2022-03-30 DIAGNOSIS — G6181 Chronic inflammatory demyelinating polyneuritis: Secondary | ICD-10-CM | POA: Diagnosis not present

## 2022-03-31 DIAGNOSIS — K5904 Chronic idiopathic constipation: Secondary | ICD-10-CM | POA: Diagnosis not present

## 2022-03-31 DIAGNOSIS — E1140 Type 2 diabetes mellitus with diabetic neuropathy, unspecified: Secondary | ICD-10-CM | POA: Diagnosis not present

## 2022-03-31 DIAGNOSIS — Z79899 Other long term (current) drug therapy: Secondary | ICD-10-CM | POA: Diagnosis not present

## 2022-03-31 DIAGNOSIS — Z87891 Personal history of nicotine dependence: Secondary | ICD-10-CM | POA: Diagnosis not present

## 2022-03-31 DIAGNOSIS — G6181 Chronic inflammatory demyelinating polyneuritis: Secondary | ICD-10-CM | POA: Diagnosis not present

## 2022-03-31 DIAGNOSIS — Z7984 Long term (current) use of oral hypoglycemic drugs: Secondary | ICD-10-CM | POA: Diagnosis not present

## 2022-03-31 DIAGNOSIS — I10 Essential (primary) hypertension: Secondary | ICD-10-CM | POA: Diagnosis not present

## 2022-03-31 DIAGNOSIS — Z993 Dependence on wheelchair: Secondary | ICD-10-CM | POA: Diagnosis not present

## 2022-03-31 DIAGNOSIS — K219 Gastro-esophageal reflux disease without esophagitis: Secondary | ICD-10-CM | POA: Diagnosis not present

## 2022-03-31 DIAGNOSIS — S91102D Unspecified open wound of left great toe without damage to nail, subsequent encounter: Secondary | ICD-10-CM | POA: Diagnosis not present

## 2022-03-31 DIAGNOSIS — W228XXD Striking against or struck by other objects, subsequent encounter: Secondary | ICD-10-CM | POA: Diagnosis not present

## 2022-03-31 DIAGNOSIS — E1144 Type 2 diabetes mellitus with diabetic amyotrophy: Secondary | ICD-10-CM | POA: Diagnosis not present

## 2022-03-31 DIAGNOSIS — K259 Gastric ulcer, unspecified as acute or chronic, without hemorrhage or perforation: Secondary | ICD-10-CM | POA: Diagnosis not present

## 2022-03-31 DIAGNOSIS — Z9181 History of falling: Secondary | ICD-10-CM | POA: Diagnosis not present

## 2022-03-31 DIAGNOSIS — E7800 Pure hypercholesterolemia, unspecified: Secondary | ICD-10-CM | POA: Diagnosis not present

## 2022-03-31 DIAGNOSIS — Z7982 Long term (current) use of aspirin: Secondary | ICD-10-CM | POA: Diagnosis not present

## 2022-03-31 DIAGNOSIS — L03031 Cellulitis of right toe: Secondary | ICD-10-CM | POA: Diagnosis not present

## 2022-04-01 DIAGNOSIS — G6181 Chronic inflammatory demyelinating polyneuritis: Secondary | ICD-10-CM | POA: Diagnosis not present

## 2022-04-04 ENCOUNTER — Telehealth: Payer: Self-pay | Admitting: Family Medicine

## 2022-04-04 NOTE — Telephone Encounter (Signed)
Kaberlin from [employer], [phone] is requesting diagnoses code list. Please call her.

## 2022-04-05 DIAGNOSIS — Z87891 Personal history of nicotine dependence: Secondary | ICD-10-CM | POA: Diagnosis not present

## 2022-04-05 DIAGNOSIS — E7800 Pure hypercholesterolemia, unspecified: Secondary | ICD-10-CM | POA: Diagnosis not present

## 2022-04-05 DIAGNOSIS — S91102D Unspecified open wound of left great toe without damage to nail, subsequent encounter: Secondary | ICD-10-CM | POA: Diagnosis not present

## 2022-04-05 DIAGNOSIS — K219 Gastro-esophageal reflux disease without esophagitis: Secondary | ICD-10-CM | POA: Diagnosis not present

## 2022-04-05 DIAGNOSIS — I10 Essential (primary) hypertension: Secondary | ICD-10-CM | POA: Diagnosis not present

## 2022-04-05 DIAGNOSIS — Z79899 Other long term (current) drug therapy: Secondary | ICD-10-CM | POA: Diagnosis not present

## 2022-04-05 DIAGNOSIS — L03031 Cellulitis of right toe: Secondary | ICD-10-CM | POA: Diagnosis not present

## 2022-04-05 DIAGNOSIS — E1144 Type 2 diabetes mellitus with diabetic amyotrophy: Secondary | ICD-10-CM | POA: Diagnosis not present

## 2022-04-05 DIAGNOSIS — K5904 Chronic idiopathic constipation: Secondary | ICD-10-CM | POA: Diagnosis not present

## 2022-04-05 DIAGNOSIS — K259 Gastric ulcer, unspecified as acute or chronic, without hemorrhage or perforation: Secondary | ICD-10-CM | POA: Diagnosis not present

## 2022-04-05 DIAGNOSIS — Z9181 History of falling: Secondary | ICD-10-CM | POA: Diagnosis not present

## 2022-04-05 DIAGNOSIS — Z7982 Long term (current) use of aspirin: Secondary | ICD-10-CM | POA: Diagnosis not present

## 2022-04-05 DIAGNOSIS — G6181 Chronic inflammatory demyelinating polyneuritis: Secondary | ICD-10-CM | POA: Diagnosis not present

## 2022-04-05 DIAGNOSIS — W228XXD Striking against or struck by other objects, subsequent encounter: Secondary | ICD-10-CM | POA: Diagnosis not present

## 2022-04-05 DIAGNOSIS — E1140 Type 2 diabetes mellitus with diabetic neuropathy, unspecified: Secondary | ICD-10-CM | POA: Diagnosis not present

## 2022-04-05 DIAGNOSIS — Z7984 Long term (current) use of oral hypoglycemic drugs: Secondary | ICD-10-CM | POA: Diagnosis not present

## 2022-04-05 DIAGNOSIS — Z993 Dependence on wheelchair: Secondary | ICD-10-CM | POA: Diagnosis not present

## 2022-04-05 NOTE — Telephone Encounter (Signed)
Faxed the problem list DX codes to [employer] for the patient and confirmation was given.

## 2022-04-07 DIAGNOSIS — L03031 Cellulitis of right toe: Secondary | ICD-10-CM | POA: Diagnosis not present

## 2022-04-07 DIAGNOSIS — E1140 Type 2 diabetes mellitus with diabetic neuropathy, unspecified: Secondary | ICD-10-CM | POA: Diagnosis not present

## 2022-04-07 DIAGNOSIS — E7800 Pure hypercholesterolemia, unspecified: Secondary | ICD-10-CM | POA: Diagnosis not present

## 2022-04-07 DIAGNOSIS — Z87891 Personal history of nicotine dependence: Secondary | ICD-10-CM | POA: Diagnosis not present

## 2022-04-07 DIAGNOSIS — Z993 Dependence on wheelchair: Secondary | ICD-10-CM | POA: Diagnosis not present

## 2022-04-07 DIAGNOSIS — Z7984 Long term (current) use of oral hypoglycemic drugs: Secondary | ICD-10-CM | POA: Diagnosis not present

## 2022-04-07 DIAGNOSIS — K5904 Chronic idiopathic constipation: Secondary | ICD-10-CM | POA: Diagnosis not present

## 2022-04-07 DIAGNOSIS — I10 Essential (primary) hypertension: Secondary | ICD-10-CM | POA: Diagnosis not present

## 2022-04-07 DIAGNOSIS — Z7982 Long term (current) use of aspirin: Secondary | ICD-10-CM | POA: Diagnosis not present

## 2022-04-07 DIAGNOSIS — K219 Gastro-esophageal reflux disease without esophagitis: Secondary | ICD-10-CM | POA: Diagnosis not present

## 2022-04-07 DIAGNOSIS — K259 Gastric ulcer, unspecified as acute or chronic, without hemorrhage or perforation: Secondary | ICD-10-CM | POA: Diagnosis not present

## 2022-04-07 DIAGNOSIS — Z79899 Other long term (current) drug therapy: Secondary | ICD-10-CM | POA: Diagnosis not present

## 2022-04-07 DIAGNOSIS — W228XXD Striking against or struck by other objects, subsequent encounter: Secondary | ICD-10-CM | POA: Diagnosis not present

## 2022-04-07 DIAGNOSIS — E1144 Type 2 diabetes mellitus with diabetic amyotrophy: Secondary | ICD-10-CM | POA: Diagnosis not present

## 2022-04-07 DIAGNOSIS — Z9181 History of falling: Secondary | ICD-10-CM | POA: Diagnosis not present

## 2022-04-07 DIAGNOSIS — S91102D Unspecified open wound of left great toe without damage to nail, subsequent encounter: Secondary | ICD-10-CM | POA: Diagnosis not present

## 2022-04-07 DIAGNOSIS — G6181 Chronic inflammatory demyelinating polyneuritis: Secondary | ICD-10-CM | POA: Diagnosis not present

## 2022-04-12 DIAGNOSIS — M48061 Spinal stenosis, lumbar region without neurogenic claudication: Secondary | ICD-10-CM | POA: Diagnosis not present

## 2022-04-12 DIAGNOSIS — Z9181 History of falling: Secondary | ICD-10-CM | POA: Diagnosis not present

## 2022-04-12 DIAGNOSIS — G6181 Chronic inflammatory demyelinating polyneuritis: Secondary | ICD-10-CM | POA: Diagnosis not present

## 2022-04-12 DIAGNOSIS — Z8616 Personal history of COVID-19: Secondary | ICD-10-CM | POA: Diagnosis not present

## 2022-04-13 DIAGNOSIS — I10 Essential (primary) hypertension: Secondary | ICD-10-CM | POA: Diagnosis not present

## 2022-04-13 DIAGNOSIS — K219 Gastro-esophageal reflux disease without esophagitis: Secondary | ICD-10-CM | POA: Diagnosis not present

## 2022-04-13 DIAGNOSIS — Z7984 Long term (current) use of oral hypoglycemic drugs: Secondary | ICD-10-CM | POA: Diagnosis not present

## 2022-04-13 DIAGNOSIS — Z7982 Long term (current) use of aspirin: Secondary | ICD-10-CM | POA: Diagnosis not present

## 2022-04-13 DIAGNOSIS — G6181 Chronic inflammatory demyelinating polyneuritis: Secondary | ICD-10-CM | POA: Diagnosis not present

## 2022-04-13 DIAGNOSIS — L03031 Cellulitis of right toe: Secondary | ICD-10-CM | POA: Diagnosis not present

## 2022-04-13 DIAGNOSIS — E7800 Pure hypercholesterolemia, unspecified: Secondary | ICD-10-CM | POA: Diagnosis not present

## 2022-04-13 DIAGNOSIS — Z993 Dependence on wheelchair: Secondary | ICD-10-CM | POA: Diagnosis not present

## 2022-04-13 DIAGNOSIS — E1144 Type 2 diabetes mellitus with diabetic amyotrophy: Secondary | ICD-10-CM | POA: Diagnosis not present

## 2022-04-13 DIAGNOSIS — S91102D Unspecified open wound of left great toe without damage to nail, subsequent encounter: Secondary | ICD-10-CM | POA: Diagnosis not present

## 2022-04-13 DIAGNOSIS — Z9181 History of falling: Secondary | ICD-10-CM | POA: Diagnosis not present

## 2022-04-13 DIAGNOSIS — W228XXD Striking against or struck by other objects, subsequent encounter: Secondary | ICD-10-CM | POA: Diagnosis not present

## 2022-04-13 DIAGNOSIS — K259 Gastric ulcer, unspecified as acute or chronic, without hemorrhage or perforation: Secondary | ICD-10-CM | POA: Diagnosis not present

## 2022-04-13 DIAGNOSIS — E1140 Type 2 diabetes mellitus with diabetic neuropathy, unspecified: Secondary | ICD-10-CM | POA: Diagnosis not present

## 2022-04-13 DIAGNOSIS — K5904 Chronic idiopathic constipation: Secondary | ICD-10-CM | POA: Diagnosis not present

## 2022-04-13 DIAGNOSIS — Z79899 Other long term (current) drug therapy: Secondary | ICD-10-CM | POA: Diagnosis not present

## 2022-04-13 DIAGNOSIS — Z87891 Personal history of nicotine dependence: Secondary | ICD-10-CM | POA: Diagnosis not present

## 2022-04-15 DIAGNOSIS — E1140 Type 2 diabetes mellitus with diabetic neuropathy, unspecified: Secondary | ICD-10-CM | POA: Diagnosis not present

## 2022-04-15 DIAGNOSIS — Z87891 Personal history of nicotine dependence: Secondary | ICD-10-CM | POA: Diagnosis not present

## 2022-04-15 DIAGNOSIS — K5904 Chronic idiopathic constipation: Secondary | ICD-10-CM | POA: Diagnosis not present

## 2022-04-15 DIAGNOSIS — I10 Essential (primary) hypertension: Secondary | ICD-10-CM | POA: Diagnosis not present

## 2022-04-15 DIAGNOSIS — Z7984 Long term (current) use of oral hypoglycemic drugs: Secondary | ICD-10-CM | POA: Diagnosis not present

## 2022-04-15 DIAGNOSIS — Z9181 History of falling: Secondary | ICD-10-CM | POA: Diagnosis not present

## 2022-04-15 DIAGNOSIS — Z79899 Other long term (current) drug therapy: Secondary | ICD-10-CM | POA: Diagnosis not present

## 2022-04-15 DIAGNOSIS — K259 Gastric ulcer, unspecified as acute or chronic, without hemorrhage or perforation: Secondary | ICD-10-CM | POA: Diagnosis not present

## 2022-04-15 DIAGNOSIS — E7800 Pure hypercholesterolemia, unspecified: Secondary | ICD-10-CM | POA: Diagnosis not present

## 2022-04-15 DIAGNOSIS — Z7982 Long term (current) use of aspirin: Secondary | ICD-10-CM | POA: Diagnosis not present

## 2022-04-15 DIAGNOSIS — E1144 Type 2 diabetes mellitus with diabetic amyotrophy: Secondary | ICD-10-CM | POA: Diagnosis not present

## 2022-04-15 DIAGNOSIS — S91102D Unspecified open wound of left great toe without damage to nail, subsequent encounter: Secondary | ICD-10-CM | POA: Diagnosis not present

## 2022-04-15 DIAGNOSIS — G6181 Chronic inflammatory demyelinating polyneuritis: Secondary | ICD-10-CM | POA: Diagnosis not present

## 2022-04-15 DIAGNOSIS — Z993 Dependence on wheelchair: Secondary | ICD-10-CM | POA: Diagnosis not present

## 2022-04-15 DIAGNOSIS — K219 Gastro-esophageal reflux disease without esophagitis: Secondary | ICD-10-CM | POA: Diagnosis not present

## 2022-04-15 DIAGNOSIS — L03031 Cellulitis of right toe: Secondary | ICD-10-CM | POA: Diagnosis not present

## 2022-04-15 DIAGNOSIS — W228XXD Striking against or struck by other objects, subsequent encounter: Secondary | ICD-10-CM | POA: Diagnosis not present

## 2022-04-20 ENCOUNTER — Other Ambulatory Visit: Payer: Self-pay | Admitting: Family Medicine

## 2022-04-20 ENCOUNTER — Telehealth: Payer: Self-pay | Admitting: Family Medicine

## 2022-04-20 DIAGNOSIS — E1144 Type 2 diabetes mellitus with diabetic amyotrophy: Secondary | ICD-10-CM | POA: Diagnosis not present

## 2022-04-20 DIAGNOSIS — I10 Essential (primary) hypertension: Secondary | ICD-10-CM | POA: Diagnosis not present

## 2022-04-20 DIAGNOSIS — W228XXD Striking against or struck by other objects, subsequent encounter: Secondary | ICD-10-CM | POA: Diagnosis not present

## 2022-04-20 DIAGNOSIS — Z79899 Other long term (current) drug therapy: Secondary | ICD-10-CM | POA: Diagnosis not present

## 2022-04-20 DIAGNOSIS — L03031 Cellulitis of right toe: Secondary | ICD-10-CM | POA: Diagnosis not present

## 2022-04-20 DIAGNOSIS — Z9181 History of falling: Secondary | ICD-10-CM | POA: Diagnosis not present

## 2022-04-20 DIAGNOSIS — K259 Gastric ulcer, unspecified as acute or chronic, without hemorrhage or perforation: Secondary | ICD-10-CM | POA: Diagnosis not present

## 2022-04-20 DIAGNOSIS — K219 Gastro-esophageal reflux disease without esophagitis: Secondary | ICD-10-CM | POA: Diagnosis not present

## 2022-04-20 DIAGNOSIS — E1140 Type 2 diabetes mellitus with diabetic neuropathy, unspecified: Secondary | ICD-10-CM | POA: Diagnosis not present

## 2022-04-20 DIAGNOSIS — Z87891 Personal history of nicotine dependence: Secondary | ICD-10-CM | POA: Diagnosis not present

## 2022-04-20 DIAGNOSIS — Z7984 Long term (current) use of oral hypoglycemic drugs: Secondary | ICD-10-CM | POA: Diagnosis not present

## 2022-04-20 DIAGNOSIS — E7800 Pure hypercholesterolemia, unspecified: Secondary | ICD-10-CM | POA: Diagnosis not present

## 2022-04-20 DIAGNOSIS — Z993 Dependence on wheelchair: Secondary | ICD-10-CM | POA: Diagnosis not present

## 2022-04-20 DIAGNOSIS — G6181 Chronic inflammatory demyelinating polyneuritis: Secondary | ICD-10-CM | POA: Diagnosis not present

## 2022-04-20 DIAGNOSIS — S91102D Unspecified open wound of left great toe without damage to nail, subsequent encounter: Secondary | ICD-10-CM | POA: Diagnosis not present

## 2022-04-20 DIAGNOSIS — Z7982 Long term (current) use of aspirin: Secondary | ICD-10-CM | POA: Diagnosis not present

## 2022-04-20 DIAGNOSIS — K5904 Chronic idiopathic constipation: Secondary | ICD-10-CM | POA: Diagnosis not present

## 2022-04-20 NOTE — Telephone Encounter (Signed)
That is too high. We need to adjust his medication regimen. Is he currently taking [place] and jardiance? Or just [place]? The jardiance should have been discontinued during his last hospitalization. It would also be great if he could come in to the office for a visit in person for follow-up.

## 2022-04-20 NOTE — Telephone Encounter (Signed)
Noted. Would he be willing to do a weekly injection that is not an insulin? If so, I can send him some questions and counseling points on mychart to see if he is ok starting ozempic.

## 2022-04-20 NOTE — Addendum Note (Signed)
Addended by: Leone Haven on: 04/20/2022 02:39 PM   Modules accepted: Orders

## 2022-04-20 NOTE — Telephone Encounter (Signed)
I called and spoke with the patient and he is okay with starting the [place] and you can send him the mychart questions and counseling points.  Pearlee Arvizu,cma

## 2022-04-20 NOTE — Telephone Encounter (Signed)
I called and spoke with the patient and he stated he is only on the [place] and he is schedule for a follow up 05/18/2022.  Pradeep Beaubrun,cma

## 2022-04-20 NOTE — Telephone Encounter (Signed)
Pt called stating his blood sugar has been running between 164 to 240 and wants to know if that is ok

## 2022-04-21 NOTE — Telephone Encounter (Signed)
Dr. Caryl Bis stated that he would send the patient questions that he needed to answer before starting Ozempic and counseling points ont he medications and he was suppose to send it through [place].  Harve Spradley,cma

## 2022-04-22 DIAGNOSIS — Z79899 Other long term (current) drug therapy: Secondary | ICD-10-CM | POA: Diagnosis not present

## 2022-04-22 DIAGNOSIS — Z87891 Personal history of nicotine dependence: Secondary | ICD-10-CM | POA: Diagnosis not present

## 2022-04-22 DIAGNOSIS — Z7984 Long term (current) use of oral hypoglycemic drugs: Secondary | ICD-10-CM | POA: Diagnosis not present

## 2022-04-22 DIAGNOSIS — Z9181 History of falling: Secondary | ICD-10-CM | POA: Diagnosis not present

## 2022-04-22 DIAGNOSIS — Z993 Dependence on wheelchair: Secondary | ICD-10-CM | POA: Diagnosis not present

## 2022-04-22 DIAGNOSIS — E1144 Type 2 diabetes mellitus with diabetic amyotrophy: Secondary | ICD-10-CM | POA: Diagnosis not present

## 2022-04-22 DIAGNOSIS — S91102D Unspecified open wound of left great toe without damage to nail, subsequent encounter: Secondary | ICD-10-CM | POA: Diagnosis not present

## 2022-04-22 DIAGNOSIS — E7800 Pure hypercholesterolemia, unspecified: Secondary | ICD-10-CM | POA: Diagnosis not present

## 2022-04-22 DIAGNOSIS — W228XXD Striking against or struck by other objects, subsequent encounter: Secondary | ICD-10-CM | POA: Diagnosis not present

## 2022-04-22 DIAGNOSIS — K5904 Chronic idiopathic constipation: Secondary | ICD-10-CM | POA: Diagnosis not present

## 2022-04-22 DIAGNOSIS — K259 Gastric ulcer, unspecified as acute or chronic, without hemorrhage or perforation: Secondary | ICD-10-CM | POA: Diagnosis not present

## 2022-04-22 DIAGNOSIS — G6181 Chronic inflammatory demyelinating polyneuritis: Secondary | ICD-10-CM | POA: Diagnosis not present

## 2022-04-22 DIAGNOSIS — L03031 Cellulitis of right toe: Secondary | ICD-10-CM | POA: Diagnosis not present

## 2022-04-22 DIAGNOSIS — I10 Essential (primary) hypertension: Secondary | ICD-10-CM | POA: Diagnosis not present

## 2022-04-22 DIAGNOSIS — Z7982 Long term (current) use of aspirin: Secondary | ICD-10-CM | POA: Diagnosis not present

## 2022-04-22 DIAGNOSIS — E1140 Type 2 diabetes mellitus with diabetic neuropathy, unspecified: Secondary | ICD-10-CM | POA: Diagnosis not present

## 2022-04-22 DIAGNOSIS — K219 Gastro-esophageal reflux disease without esophagitis: Secondary | ICD-10-CM | POA: Diagnosis not present

## 2022-04-25 NOTE — Telephone Encounter (Signed)
Message sent to patient through mychart. Please ensure he received the message and responds as requested.

## 2022-04-27 NOTE — Telephone Encounter (Signed)
I called the patient and informed him to check mychart and answer the questions on the message and he understood. Redith Drach,cma

## 2022-04-28 ENCOUNTER — Encounter: Payer: Self-pay | Admitting: Podiatry

## 2022-04-28 ENCOUNTER — Ambulatory Visit (INDEPENDENT_AMBULATORY_CARE_PROVIDER_SITE_OTHER): Payer: Medicare Other | Admitting: Podiatry

## 2022-04-28 DIAGNOSIS — M79676 Pain in unspecified toe(s): Secondary | ICD-10-CM

## 2022-04-28 DIAGNOSIS — E119 Type 2 diabetes mellitus without complications: Secondary | ICD-10-CM

## 2022-04-28 DIAGNOSIS — B351 Tinea unguium: Secondary | ICD-10-CM | POA: Diagnosis not present

## 2022-04-28 MED ORDER — SEMAGLUTIDE(0.25 OR 0.5MG/DOS) 2 MG/3ML ~~LOC~~ SOPN: PEN_INJECTOR | SUBCUTANEOUS | 3 refills | Status: DC

## 2022-04-28 NOTE — Progress Notes (Signed)
This patient returns to my office for at risk foot care.  This patient requires this care by a professional since this patient will be at risk due to having diabetes.  Patient has developed a nerve condition which developed from Covid.  This patient is unable to cut nails himself since the patient cannot reach his nails.These nails are painful walking and wearing shoes.  This patient presents for at risk foot care today.  General Appearance  Alert, conversant and in no acute stress.  Vascular  Dorsalis pedis and posterior tibial  pulses are palpable  bilaterally.  Capillary return is within normal limits  bilaterally. Temperature is within normal limits  bilaterally.  Neurologic  Senn-Weinstein monofilament wire test within normal limits  bilaterally. Muscle power within normal limits bilaterally.  Nails Thick disfigured discolored nails with subungual debris  from hallux to fifth toes bilaterally. No evidence of bacterial infection or drainage bilaterally.  Orthopedic  No limitations of motion  feet .  No crepitus or effusions noted.  Hallux malleus and hammer toes 2-5  B/l.  Skin  normotropic skin with no porokeratosis noted bilaterally.  No signs of infections or ulcers noted.     Onychomycosis  Pain in right toes  Pain in left toes  Consent was obtained for treatment procedures.   Mechanical debridement of nails 1-5  bilaterally performed with a nail nipper.  Filed with dremel without incident. Padding dispensed.  Told him to make an appointment with Dr.  Posey Pronto for surgical consult.   Return office visit  3 months                   Told patient to return for periodic foot care and evaluation due to potential at risk complications.   Gardiner Barefoot DPM

## 2022-04-28 NOTE — Addendum Note (Signed)
Addended by: Leone Haven on: 04/28/2022 03:48 PM   Modules accepted: Orders

## 2022-05-07 ENCOUNTER — Other Ambulatory Visit: Payer: Self-pay | Admitting: Family Medicine

## 2022-05-10 ENCOUNTER — Ambulatory Visit: Payer: Medicare Other | Admitting: Gastroenterology

## 2022-05-12 ENCOUNTER — Ambulatory Visit: Payer: Medicare Other | Admitting: Podiatry

## 2022-05-18 ENCOUNTER — Ambulatory Visit: Payer: Medicare Other | Admitting: Family Medicine

## 2022-05-20 ENCOUNTER — Ambulatory Visit: Payer: Medicare Other | Admitting: Urology

## 2022-05-25 ENCOUNTER — Telehealth: Payer: Self-pay | Admitting: Family Medicine

## 2022-05-25 ENCOUNTER — Ambulatory Visit (INDEPENDENT_AMBULATORY_CARE_PROVIDER_SITE_OTHER): Payer: Medicare Other | Admitting: Family Medicine

## 2022-05-25 ENCOUNTER — Encounter: Payer: Self-pay | Admitting: Family Medicine

## 2022-05-25 DIAGNOSIS — G6181 Chronic inflammatory demyelinating polyneuritis: Secondary | ICD-10-CM | POA: Diagnosis not present

## 2022-05-25 DIAGNOSIS — L989 Disorder of the skin and subcutaneous tissue, unspecified: Secondary | ICD-10-CM | POA: Diagnosis not present

## 2022-05-25 DIAGNOSIS — I10 Essential (primary) hypertension: Secondary | ICD-10-CM | POA: Diagnosis not present

## 2022-05-25 DIAGNOSIS — E119 Type 2 diabetes mellitus without complications: Secondary | ICD-10-CM

## 2022-05-25 DIAGNOSIS — I739 Peripheral vascular disease, unspecified: Secondary | ICD-10-CM | POA: Diagnosis not present

## 2022-05-25 DIAGNOSIS — L299 Pruritus, unspecified: Secondary | ICD-10-CM | POA: Insufficient documentation

## 2022-05-25 MED ORDER — TRIAMCINOLONE ACETONIDE 0.1 % EX CREA: 1.0000 | TOPICAL_CREAM | Freq: Two times a day (BID) | CUTANEOUS | 0 refills | Status: DC

## 2022-05-25 NOTE — Patient Instructions (Signed)
Nice to see you. Please let me know what your glucose has been running in about 4 weeks. We can consider increasing the ozempic again at that time.  Try the triamcinolone for the lesion on your leg. If it is not improving in the next 1-2 weeks please let me know and we can have you see dermatology.  Home health should contact you to set up PT again.

## 2022-05-25 NOTE — Assessment & Plan Note (Signed)
Uncontrolled though he is just now working on increasing his Ozempic.  He will go to the 0.5 mg dose for 4 weeks.  He will send me his readings around that time and then we can consider increasing to the 1 mg dose.  Check A1c today.

## 2022-05-25 NOTE — Progress Notes (Signed)
[name], MD Phone: [phone]  Michael Montgomery is a 61 y.o. male who presents today for f/u.  HYPERTENSION Disease Monitoring Home BP Monitoring 130/60-80 Chest pain- no    Dyspnea- no Medications Compliance-  taking amlodipine, HCTZ.  Edema- no BMET    Component Value Date/Time   NA 139 12/25/2021 0432   K 3.2 (L) 12/25/2021 0432   CL 113 (H) 12/25/2021 0432   CO2 19 (L) 12/25/2021 0432   GLUCOSE 118 (H) 12/25/2021 0432   BUN 6 12/25/2021 0432   CREATININE 0.38 (L) 12/25/2021 0432   CREATININE 0.55 (L) 07/12/2019 1550   CALCIUM 8.1 (L) 12/25/2021 0432   GFRNONAA >60 12/25/2021 0432   GFRAA >60 10/10/2019 1746   DIABETES Disease Monitoring: Blood Sugar ranges-250-300 Polyuria/phagia/dipsia- no      Optho- UTD Medications: Compliance- taking ozempic 0.25 mg weekly, he is about to increase the dose Hypoglycemic symptoms- no  Skin lesion: Patient notes there is a skin lesion on his left thigh.  Its been present for couple months.  It has enlarged mildly.  It does itch.  CIDP: Patient notes he wants to work with physical therapy again to try to build up strength in his legs.  He continues to follow with neurology and continues with his infusions.   Social History   Tobacco Use  Smoking Status Former   Types: Cigars  Smokeless Tobacco Never  Tobacco Comments   one cigar once a month to once every 2 months     Current Outpatient Medications on File Prior to Visit  Medication Sig Dispense Refill   Accu-Chek Softclix Lancets lancets 1 each by Other route daily. Check sugar fasting daily. Dx Code E11.9. 100 each 3   amLODipine (NORVASC) 10 MG tablet TAKE 1 TABLET BY MOUTH  DAILY (Patient taking differently: Take 10 mg by mouth daily.) 90 tablet 3   aspirin EC 81 MG tablet Take 81 mg by mouth daily. Swallow whole.     atorvastatin (LIPITOR) 40 MG tablet TAKE 1 TABLET BY MOUTH EVERY DAY 90 tablet 1   Blood Glucose Monitoring Suppl (ACCU-CHEK GUIDE ME) w/Device  KIT USE ONCE DAILY FASTING. DX CODE E11.9. 1 kit 0   buPROPion (WELLBUTRIN XL) 300 MG 24 hr tablet Take by mouth.     fluticasone (FLONASE) 50 MCG/ACT nasal spray SPRAY 2 SPRAYS INTO EACH NOSTRIL EVERY DAY 48 mL 1   gabapentin (NEURONTIN) 100 MG capsule Take 200 mg by mouth 3 (three) times daily.     GAMUNEX-C 20 GM/200ML SOLN      glucose blood test strip Use once daily fasting. Dx Code E11.9 100 each 12   hydrochlorothiazide (MICROZIDE) 12.5 MG capsule Take 1 capsule (12.5 mg total) by mouth daily. 90 capsule 1   LINZESS 72 MCG capsule TAKE 1 CAPSULE BY MOUTH EVERY DAY BEFORE BREAKFAST 30 capsule 1   Multiple Vitamin (MULTIVITAMIN) capsule Take 1 capsule by mouth daily.     Nutritional Supplements (ENSURE ACTIVE HIGH PROTEIN) LIQD Take 1 Can by mouth 3 (three) times daily as needed. 21330 mL 11   Omega-3 Fatty Acids (FISH OIL PO) Take 1 capsule by mouth daily.      Semaglutide,0.25 or 0.5MG/DOS, 2 MG/3ML SOPN Inject 0.25 mg into the skin once a week for 28 days, THEN 0.5 mg once a week. 3 mL 3   sitaGLIPtin (JANUVIA) 100 MG tablet Take 1 tablet (100 mg total) by mouth daily. 90 tablet 1   sodium chloride 0.9 % infusion  Inject into the vein.     No current facility-administered medications on file prior to visit.     ROS see history of present illness  Objective  Physical Exam Vitals:   05/25/22 1428 05/25/22 1436  BP: 120/70   Pulse: (!) 121 (!) 101  Temp: 98.3 F (36.8 C)   SpO2: 99%     BP Readings from Last 3 Encounters:  05/25/22 120/70  12/25/21 (!) 149/76  04/21/21 120/70   Wt Readings from Last 3 Encounters:  05/25/22 157 lb (71.2 kg)  02/16/22 158 lb (71.7 kg)  12/23/21 168 lb 10.4 oz (76.5 kg)    Physical Exam Constitutional:      General: He is not in acute distress.    Appearance: He is not diaphoretic.  Cardiovascular:     Rate and Rhythm: Normal rate and regular rhythm.     Heart sounds: Normal heart sounds.  Pulmonary:     Effort: Pulmonary effort  is normal.     Breath sounds: Normal breath sounds.  Musculoskeletal:     Right lower leg: No edema.     Left lower leg: No edema.  Skin:    General: Skin is warm and dry.  Neurological:     Mental Status: He is alert.   Left thigh   Assessment/Plan: Please see individual problem list.  Problem List Items Addressed This Visit     CIDP (chronic inflammatory demyelinating polyneuropathy) (HCC) (Chronic)    Refer for home health PT.  Continue management through neurology.      Relevant Orders   Ambulatory referral to [place]   Diabetes [facility]) (Chronic)    Uncontrolled though he is just now working on increasing his Ozempic.  He will go to the 0.5 mg dose for 4 weeks.  He will send me his readings around that time and then we can consider increasing to the 1 mg dose.  Check A1c today.      Relevant Orders   HgB A1c   Urine Microalbumin w/creat. ratio   Essential hypertension (Chronic)    Well-controlled.  He will continue amlodipine 10 mg once daily and HCTZ 12.5 mg daily.      Relevant Orders   Comp Met (CMET)   Lipid panel   PAD (peripheral artery disease) ([place])    Refer to vascular surgery.  He had screening through his insurance company.      Relevant Orders   Ambulatory referral to Vascular Surgery   Skin lesion    Undetermined cause.  I will give him a trial of topical triamcinolone.  If this is not improving over the next 1 to 2 weeks he will let me know and we can refer to dermatology.      Relevant Medications   triamcinolone cream (KENALOG) 0.1 %     Health Maintenance: Patient sees GI in the near future and can discuss colon cancer screening with them.  Return in about 3 months (around 08/25/2022) for dm.   [name], MD [place]

## 2022-05-25 NOTE — Assessment & Plan Note (Signed)
Refer for home health PT.  Continue management through neurology.

## 2022-05-25 NOTE — Assessment & Plan Note (Signed)
Refer to vascular surgery.  He had screening through his insurance company.

## 2022-05-25 NOTE — Assessment & Plan Note (Signed)
Undetermined cause.  I will give him a trial of topical triamcinolone.  If this is not improving over the next 1 to 2 weeks he will let me know and we can refer to dermatology.

## 2022-05-25 NOTE — Assessment & Plan Note (Addendum)
Well-controlled.  He will continue amlodipine 10 mg once daily and HCTZ 12.5 mg daily.

## 2022-05-25 NOTE — Telephone Encounter (Signed)
Pt states that his dentist need a letter to see if he is healthy enough to get his teeth pulled

## 2022-05-26 DIAGNOSIS — G6181 Chronic inflammatory demyelinating polyneuritis: Secondary | ICD-10-CM | POA: Diagnosis not present

## 2022-05-26 LAB — COMPREHENSIVE METABOLIC PANEL
ALT: 68 U/L — ABNORMAL HIGH (ref 0–53)
AST: 37 U/L (ref 0–37)
Albumin: 4.7 g/dL (ref 3.5–5.2)
Alkaline Phosphatase: 172 U/L — ABNORMAL HIGH (ref 39–117)
BUN: 13 mg/dL (ref 6–23)
CO2: 27 mEq/L (ref 19–32)
Calcium: 9.9 mg/dL (ref 8.4–10.5)
Chloride: 96 mEq/L (ref 96–112)
Creatinine, Ser: 0.56 mg/dL (ref 0.40–1.50)
GFR: 108.06 mL/min (ref >60.00)
Glucose, Bld: 351 mg/dL — ABNORMAL HIGH (ref 70–99)
Potassium: 4.3 mEq/L (ref 3.5–5.1)
Sodium: 133 mEq/L — ABNORMAL LOW (ref 135–145)
Total Bilirubin: 0.5 mg/dL (ref 0.2–1.2)
Total Protein: 7.8 g/dL (ref 6.0–8.3)

## 2022-05-26 LAB — MICROALBUMIN / CREATININE URINE RATIO
Creatinine,U: 37.2 mg/dL
Microalb Creat Ratio: 26.7 mg/g (ref 0.0–30.0)
Microalb, Ur: 9.9 mg/dL — ABNORMAL HIGH (ref 0.0–1.9)

## 2022-05-26 LAB — LIPID PANEL
Cholesterol: 114 mg/dL (ref 0–200)
HDL: 31.1 mg/dL — ABNORMAL LOW (ref >39.00)
LDL Cholesterol: 65 mg/dL (ref 0–99)
NonHDL: 83.37
Total CHOL/HDL Ratio: 4
Triglycerides: 94 mg/dL (ref 0.0–149.0)
VLDL: 18.8 mg/dL (ref 0.0–40.0)

## 2022-05-26 LAB — HEMOGLOBIN A1C: Hgb A1c MFr Bld: 7.5 % — ABNORMAL HIGH (ref 4.6–6.5)

## 2022-05-27 ENCOUNTER — Other Ambulatory Visit: Payer: Self-pay | Admitting: Family Medicine

## 2022-05-27 ENCOUNTER — Telehealth: Payer: Self-pay | Admitting: Family Medicine

## 2022-05-27 DIAGNOSIS — L989 Disorder of the skin and subcutaneous tissue, unspecified: Secondary | ICD-10-CM

## 2022-05-27 NOTE — Telephone Encounter (Signed)
Patient called asking to have a nausea medicine refilled, however he did not know the name of the medicine. He stated he as taken it before.

## 2022-05-27 NOTE — Telephone Encounter (Signed)
I called the patient and he stated that after he takes the Ozempic he is feeling nauseous.  Shaheen Mende,cma

## 2022-05-27 NOTE — Telephone Encounter (Signed)
Can you find out why he needs a nausea medication?

## 2022-05-29 NOTE — Telephone Encounter (Signed)
Noted. Please find out how long the nausea lasts for and how severe it is. If it is long lasting enough or bad enough we may need to stop the ozempic.

## 2022-05-31 ENCOUNTER — Ambulatory Visit: Payer: Medicare Other | Admitting: Podiatry

## 2022-05-31 ENCOUNTER — Other Ambulatory Visit: Payer: Self-pay | Admitting: Family Medicine

## 2022-05-31 DIAGNOSIS — R7989 Other specified abnormal findings of blood chemistry: Secondary | ICD-10-CM

## 2022-05-31 MED ORDER — ONDANSETRON HCL 4 MG PO TABS: 4.0000 mg | ORAL_TABLET | Freq: Three times a day (TID) | ORAL | 0 refills | Status: DC | PRN

## 2022-05-31 NOTE — Telephone Encounter (Signed)
I called and spoke with the patient about the Nausea, he stated that the Ozempic was not why he wanted the nausea medication and he stated he is not experiencing nausea with that anymore, he feels like he has that under control but when he wakes up int he morning to sit up he gets very dizzy and that when he has the nausea and it does not happen daily just once in a while and that why he was originally getting the medication and would like to have it on hand for that.

## 2022-05-31 NOTE — Telephone Encounter (Signed)
I called and spoke with the patient to inform him that the medication was sent to [facility] [place] and that is the correct pharmacy, patient stated that he is having a tooth pulled and the dentist wanted to know if he could have clearance for a tooth extraction.  Chrystel Barefield,cma

## 2022-05-31 NOTE — Telephone Encounter (Signed)
See other phone note

## 2022-05-31 NOTE — Telephone Encounter (Signed)
I do not know a reason that he could not have his tooth pulled. He should be able to proceed with this.

## 2022-05-31 NOTE — Telephone Encounter (Signed)
Noted. [facility] [place] [facility] was the local pharmacy that was listed. If he needs it sent somewhere else you could send the zofran to a different pharmacy.

## 2022-06-02 NOTE — Telephone Encounter (Signed)
I called the patient and informed him that he does not have any medications to prevent him from getting his tooth pulled I gave him our fax number for them to fax a form for [place] to sign.  He understood.  Deasia Chiu,cma

## 2022-06-06 ENCOUNTER — Telehealth: Payer: Self-pay

## 2022-06-06 NOTE — Progress Notes (Signed)
Spoke to Patient to let him know that Dr. Caryl Bis would like him to have his labs rechecked in several weeks so I scheduled him for 9/05 at 2:30. Patient stated he does not have any abdominal pain at this time.

## 2022-06-06 NOTE — Telephone Encounter (Signed)
Spoke to Patient to let him know that Dr. Caryl Bis would like him to have his labs rechecked in several weeks so I scheduled him for 9/05 at 2:30. Patient stated he does not have any abdominal pain at this time.

## 2022-06-07 DIAGNOSIS — G6181 Chronic inflammatory demyelinating polyneuritis: Secondary | ICD-10-CM | POA: Diagnosis not present

## 2022-06-08 DIAGNOSIS — G6181 Chronic inflammatory demyelinating polyneuritis: Secondary | ICD-10-CM | POA: Diagnosis not present

## 2022-06-09 ENCOUNTER — Ambulatory Visit: Payer: Medicare Other | Admitting: Podiatry

## 2022-06-09 DIAGNOSIS — G6181 Chronic inflammatory demyelinating polyneuritis: Secondary | ICD-10-CM | POA: Diagnosis not present

## 2022-06-10 DIAGNOSIS — G6181 Chronic inflammatory demyelinating polyneuritis: Secondary | ICD-10-CM | POA: Diagnosis not present

## 2022-06-15 ENCOUNTER — Ambulatory Visit: Payer: Medicare Other | Admitting: Urology

## 2022-06-16 ENCOUNTER — Other Ambulatory Visit: Payer: Self-pay | Admitting: Internal Medicine

## 2022-06-16 DIAGNOSIS — Z7984 Long term (current) use of oral hypoglycemic drugs: Secondary | ICD-10-CM | POA: Diagnosis not present

## 2022-06-16 DIAGNOSIS — I10 Essential (primary) hypertension: Secondary | ICD-10-CM | POA: Diagnosis not present

## 2022-06-16 DIAGNOSIS — G6181 Chronic inflammatory demyelinating polyneuritis: Secondary | ICD-10-CM | POA: Diagnosis not present

## 2022-06-16 DIAGNOSIS — Z87891 Personal history of nicotine dependence: Secondary | ICD-10-CM | POA: Diagnosis not present

## 2022-06-16 DIAGNOSIS — E1151 Type 2 diabetes mellitus with diabetic peripheral angiopathy without gangrene: Secondary | ICD-10-CM | POA: Diagnosis not present

## 2022-06-16 DIAGNOSIS — E1165 Type 2 diabetes mellitus with hyperglycemia: Secondary | ICD-10-CM | POA: Diagnosis not present

## 2022-06-23 ENCOUNTER — Ambulatory Visit: Payer: Medicare Other | Admitting: Urology

## 2022-06-23 DIAGNOSIS — E1165 Type 2 diabetes mellitus with hyperglycemia: Secondary | ICD-10-CM | POA: Diagnosis not present

## 2022-06-23 DIAGNOSIS — I10 Essential (primary) hypertension: Secondary | ICD-10-CM | POA: Diagnosis not present

## 2022-06-23 DIAGNOSIS — G6181 Chronic inflammatory demyelinating polyneuritis: Secondary | ICD-10-CM | POA: Diagnosis not present

## 2022-06-23 DIAGNOSIS — Z87891 Personal history of nicotine dependence: Secondary | ICD-10-CM | POA: Diagnosis not present

## 2022-06-23 DIAGNOSIS — Z7984 Long term (current) use of oral hypoglycemic drugs: Secondary | ICD-10-CM | POA: Diagnosis not present

## 2022-06-23 DIAGNOSIS — E1151 Type 2 diabetes mellitus with diabetic peripheral angiopathy without gangrene: Secondary | ICD-10-CM | POA: Diagnosis not present

## 2022-06-26 ENCOUNTER — Other Ambulatory Visit: Payer: Self-pay | Admitting: Family Medicine

## 2022-06-28 ENCOUNTER — Other Ambulatory Visit: Payer: Medicare Other

## 2022-07-04 DIAGNOSIS — Z9181 History of falling: Secondary | ICD-10-CM | POA: Diagnosis not present

## 2022-07-04 DIAGNOSIS — Z8616 Personal history of COVID-19: Secondary | ICD-10-CM | POA: Diagnosis not present

## 2022-07-04 DIAGNOSIS — G6181 Chronic inflammatory demyelinating polyneuritis: Secondary | ICD-10-CM | POA: Diagnosis not present

## 2022-07-04 DIAGNOSIS — M48061 Spinal stenosis, lumbar region without neurogenic claudication: Secondary | ICD-10-CM | POA: Diagnosis not present

## 2022-07-06 ENCOUNTER — Ambulatory Visit: Payer: Medicare Other | Admitting: Urology

## 2022-07-06 DIAGNOSIS — G6181 Chronic inflammatory demyelinating polyneuritis: Secondary | ICD-10-CM | POA: Diagnosis not present

## 2022-07-06 DIAGNOSIS — Z87891 Personal history of nicotine dependence: Secondary | ICD-10-CM | POA: Diagnosis not present

## 2022-07-06 DIAGNOSIS — Z7984 Long term (current) use of oral hypoglycemic drugs: Secondary | ICD-10-CM | POA: Diagnosis not present

## 2022-07-06 DIAGNOSIS — E1151 Type 2 diabetes mellitus with diabetic peripheral angiopathy without gangrene: Secondary | ICD-10-CM | POA: Diagnosis not present

## 2022-07-06 DIAGNOSIS — E1165 Type 2 diabetes mellitus with hyperglycemia: Secondary | ICD-10-CM | POA: Diagnosis not present

## 2022-07-06 DIAGNOSIS — I10 Essential (primary) hypertension: Secondary | ICD-10-CM | POA: Diagnosis not present

## 2022-07-07 ENCOUNTER — Ambulatory Visit (INDEPENDENT_AMBULATORY_CARE_PROVIDER_SITE_OTHER): Payer: Medicare Other | Admitting: Podiatry

## 2022-07-07 ENCOUNTER — Ambulatory Visit: Payer: Medicare Other | Admitting: Gastroenterology

## 2022-07-07 DIAGNOSIS — Z87891 Personal history of nicotine dependence: Secondary | ICD-10-CM | POA: Diagnosis not present

## 2022-07-07 DIAGNOSIS — E119 Type 2 diabetes mellitus without complications: Secondary | ICD-10-CM | POA: Diagnosis not present

## 2022-07-07 DIAGNOSIS — M2041 Other hammer toe(s) (acquired), right foot: Secondary | ICD-10-CM

## 2022-07-07 DIAGNOSIS — M2042 Other hammer toe(s) (acquired), left foot: Secondary | ICD-10-CM

## 2022-07-07 DIAGNOSIS — I999 Unspecified disorder of circulatory system: Secondary | ICD-10-CM | POA: Diagnosis not present

## 2022-07-07 DIAGNOSIS — Z7984 Long term (current) use of oral hypoglycemic drugs: Secondary | ICD-10-CM | POA: Diagnosis not present

## 2022-07-07 DIAGNOSIS — G6181 Chronic inflammatory demyelinating polyneuritis: Secondary | ICD-10-CM | POA: Diagnosis not present

## 2022-07-07 DIAGNOSIS — E1165 Type 2 diabetes mellitus with hyperglycemia: Secondary | ICD-10-CM | POA: Diagnosis not present

## 2022-07-07 DIAGNOSIS — E1151 Type 2 diabetes mellitus with diabetic peripheral angiopathy without gangrene: Secondary | ICD-10-CM | POA: Diagnosis not present

## 2022-07-07 DIAGNOSIS — I10 Essential (primary) hypertension: Secondary | ICD-10-CM | POA: Diagnosis not present

## 2022-07-12 DIAGNOSIS — Z8639 Personal history of other endocrine, nutritional and metabolic disease: Secondary | ICD-10-CM | POA: Diagnosis not present

## 2022-07-12 DIAGNOSIS — R413 Other amnesia: Secondary | ICD-10-CM | POA: Diagnosis not present

## 2022-07-12 DIAGNOSIS — M2040 Other hammer toe(s) (acquired), unspecified foot: Secondary | ICD-10-CM | POA: Diagnosis not present

## 2022-07-12 DIAGNOSIS — G6181 Chronic inflammatory demyelinating polyneuritis: Secondary | ICD-10-CM | POA: Diagnosis not present

## 2022-07-13 ENCOUNTER — Telehealth: Payer: Self-pay

## 2022-07-13 NOTE — Telephone Encounter (Signed)
Sonia called from [facility] to state they did receive the completed medical clearance form for patient, but it needs to be signed by Dr. [name].  Alleen Borne states she will fax it again in case we need the form.  Sonia asked that Dr. Caryl Bis please sign at the bottom of the page in one of the blank spots.

## 2022-07-13 NOTE — Telephone Encounter (Signed)
Patient called to state we filled out the medical clearance form for [facility], but Dr. [name] still needs to sign the form.  I let patient know we just received a call from [facility] and they requested a signature from Dr. Caryl Bis.

## 2022-07-14 ENCOUNTER — Other Ambulatory Visit: Payer: Medicare Other

## 2022-07-14 NOTE — Telephone Encounter (Signed)
The form for dentistry is in the sign basket.  Zeek Rostron,cma

## 2022-07-19 NOTE — Progress Notes (Signed)
Subjective:  Patient ID: Michael Montgomery, male    DOB: 28-Jun-1961,  MRN: [id number]  Chief Complaint  Patient presents with   Pricilla Larsson    61 y.o. male presents with the above complaint.  Patient presents with flexor contracture of bilateral 1 through 5.  Patient states the toes are curling under has been going on progressively.  They have slowly gotten worse and it feels like he is putting a lot of pressure in all of his toes.  He is a diabetic with last A1c of 7.5.  Patient states that hurts with ambulation hurts with pressure.  He wanted to discuss treatment options including surgical options at this time.  He denies any other acute issues   Review of Systems: Negative except as noted in the HPI. Denies N/V/F/Ch.  Past Medical History:  Diagnosis Date   Cataract march, april   bilateral removed    Chickenpox    Diabetes mellitus without complication ([place])    metformin    Diabetic amyotrophy ([place])    sees dr at [facility]   Gastric ulcer    Heart murmur    When he was younger, no recent issues   High blood pressure    controlled with meds   Hypercholesteremia    controlled with medication   Knee injury    left   Motion sickness    boats   Neuropathy     Current Outpatient Medications:    Accu-Chek Softclix Lancets lancets, 1 each by Other route daily. Check sugar fasting daily. Dx Code E11.9., Disp: 100 each, Rfl: 3   amLODipine (NORVASC) 10 MG tablet, TAKE 1 TABLET BY MOUTH DAILY, Disp: 100 tablet, Rfl: 2   aspirin EC 81 MG tablet, Take 81 mg by mouth daily. Swallow whole., Disp: , Rfl:    atorvastatin (LIPITOR) 40 MG tablet, TAKE 1 TABLET BY MOUTH EVERY DAY, Disp: 90 tablet, Rfl: 1   Blood Glucose Monitoring Suppl (ACCU-CHEK GUIDE ME) w/Device KIT, USE ONCE DAILY FASTING. DX CODE E11.9., Disp: 1 kit, Rfl: 0   buPROPion (WELLBUTRIN XL) 300 MG 24 hr tablet, Take by mouth., Disp: , Rfl:    fluticasone (FLONASE) 50 MCG/ACT nasal spray, SPRAY 2 SPRAYS INTO EACH NOSTRIL EVERY  DAY, Disp: 48 mL, Rfl: 1   gabapentin (NEURONTIN) 100 MG capsule, Take 200 mg by mouth 3 (three) times daily., Disp: , Rfl:    GAMUNEX-C 20 GM/200ML SOLN, , Disp: , Rfl:    glucose blood test strip, Use once daily fasting. Dx Code E11.9, Disp: 100 each, Rfl: 12   hydrochlorothiazide (MICROZIDE) 12.5 MG capsule, TAKE 1 CAPSULE BY MOUTH EVERY DAY, Disp: 90 capsule, Rfl: 1   LINZESS 72 MCG capsule, TAKE 1 CAPSULE BY MOUTH EVERY DAY BEFORE BREAKFAST, Disp: 30 capsule, Rfl: 1   Multiple Vitamin (MULTIVITAMIN) capsule, Take 1 capsule by mouth daily., Disp: , Rfl:    Nutritional Supplements (ENSURE ACTIVE HIGH PROTEIN) LIQD, Take 1 Can by mouth 3 (three) times daily as needed., Disp: 21330 mL, Rfl: 11   Omega-3 Fatty Acids (FISH OIL PO), Take 1 capsule by mouth daily. , Disp: , Rfl:    ondansetron (ZOFRAN) 4 MG tablet, Take 1 tablet (4 mg total) by mouth every 8 (eight) hours as needed for nausea or vomiting., Disp: 20 tablet, Rfl: 0   OZEMPIC, 0.25 OR 0.5 MG/DOSE, 2 MG/3ML SOPN, INJECT 0.25 MG SUBCUTANEOUSLY  WEEKLY FOR 4 WEEKS, THEN 0.5 MG  WEEKLY THEREAFTER, Disp: 9 mL, Rfl: 3  sodium chloride 0.9 % infusion, Inject into the vein., Disp: , Rfl:    triamcinolone cream (KENALOG) 0.1 %, APPLY TO AFFECTED AREA TWICE A DAY, Disp: 30 g, Rfl: 0  Social History   Tobacco Use  Smoking Status Former   Types: Cigars  Smokeless Tobacco Never  Tobacco Comments   one cigar once a month to once every 2 months     No Known Allergies Objective:  There were no vitals filed for this visit. There is no height or weight on file to calculate BMI. Constitutional Well developed. Well nourished.  Vascular Dorsalis pedis pulses non palpable bilaterally. Posterior tibial pulses non palpable bilaterally. Capillary refill diminished to all digits.  No cyanosis or clubbing noted. Pedal hair growth not present  Neurologic Normal speech. Oriented to person, place, and time. Epicritic sensation to light touch  grossly present bilaterally.  Dermatologic Nails well groomed and normal in appearance. No open wounds. No skin lesions.  Orthopedic: Flexor contracture noted to bilateral digits 1 through 5.,  Flexible and reducible deformity.  Pain on palpation.  Arthritic changes noted of the IPJ of the bilateral hallux.   Radiographs: None Assessment:   1. Vascular abnormality   2. Hammertoe, bilateral   3. Diabetes mellitus without complication ([place])    Plan:  Patient was evaluated and treated and all questions answered.  Bilateral flexor contracture bilateral 1 through 5 with underlying diabetes -All questions and concerns were discussed with the patient in extensive detail.  Given that he is a diabetic with A1c of 7.5% in the setting of nonpalpable pulses I believe patient will benefit from ABIs PVRs to assess the vascular flow once the vascular flow has improved we will discuss surgical intervention at this time.  Patient is a high risk of developing complications from wound.  I discussed with patient he states understanding. -ABI was ordered  No follow-ups on file.

## 2022-07-20 ENCOUNTER — Encounter (INDEPENDENT_AMBULATORY_CARE_PROVIDER_SITE_OTHER): Payer: Medicare Other | Admitting: Nurse Practitioner

## 2022-07-20 ENCOUNTER — Encounter (INDEPENDENT_AMBULATORY_CARE_PROVIDER_SITE_OTHER): Payer: Medicare Other

## 2022-07-21 ENCOUNTER — Ambulatory Visit: Payer: Medicare Other | Admitting: Urology

## 2022-07-22 DIAGNOSIS — Z87891 Personal history of nicotine dependence: Secondary | ICD-10-CM | POA: Diagnosis not present

## 2022-07-22 DIAGNOSIS — Z7984 Long term (current) use of oral hypoglycemic drugs: Secondary | ICD-10-CM | POA: Diagnosis not present

## 2022-07-22 DIAGNOSIS — E1151 Type 2 diabetes mellitus with diabetic peripheral angiopathy without gangrene: Secondary | ICD-10-CM | POA: Diagnosis not present

## 2022-07-22 DIAGNOSIS — E1165 Type 2 diabetes mellitus with hyperglycemia: Secondary | ICD-10-CM | POA: Diagnosis not present

## 2022-07-22 DIAGNOSIS — G6181 Chronic inflammatory demyelinating polyneuritis: Secondary | ICD-10-CM | POA: Diagnosis not present

## 2022-07-22 DIAGNOSIS — I10 Essential (primary) hypertension: Secondary | ICD-10-CM | POA: Diagnosis not present

## 2022-07-26 ENCOUNTER — Other Ambulatory Visit (INDEPENDENT_AMBULATORY_CARE_PROVIDER_SITE_OTHER): Payer: Medicare Other

## 2022-07-26 DIAGNOSIS — R7989 Other specified abnormal findings of blood chemistry: Secondary | ICD-10-CM | POA: Diagnosis not present

## 2022-07-26 LAB — HEPATIC FUNCTION PANEL
ALT: 65 U/L — ABNORMAL HIGH (ref 0–53)
AST: 33 U/L (ref 0–37)
Albumin: 4.4 g/dL (ref 3.5–5.2)
Alkaline Phosphatase: 169 U/L — ABNORMAL HIGH (ref 39–117)
Bilirubin, Direct: 0.1 mg/dL (ref 0.0–0.3)
Total Bilirubin: 0.6 mg/dL (ref 0.2–1.2)
Total Protein: 7.4 g/dL (ref 6.0–8.3)

## 2022-07-28 ENCOUNTER — Encounter (INDEPENDENT_AMBULATORY_CARE_PROVIDER_SITE_OTHER): Payer: Medicare Other | Admitting: Nurse Practitioner

## 2022-07-28 ENCOUNTER — Encounter (INDEPENDENT_AMBULATORY_CARE_PROVIDER_SITE_OTHER): Payer: Medicare Other

## 2022-07-29 ENCOUNTER — Other Ambulatory Visit: Payer: Self-pay | Admitting: Family Medicine

## 2022-07-29 ENCOUNTER — Ambulatory Visit (INDEPENDENT_AMBULATORY_CARE_PROVIDER_SITE_OTHER): Payer: Medicare Other

## 2022-07-29 VITALS — BP 125/78 | HR 85 | Ht 71.0 in | Wt 158.0 lb

## 2022-07-29 DIAGNOSIS — Z0000 Encounter for general adult medical examination without abnormal findings: Secondary | ICD-10-CM

## 2022-07-29 DIAGNOSIS — R7989 Other specified abnormal findings of blood chemistry: Secondary | ICD-10-CM

## 2022-07-29 NOTE — Patient Instructions (Addendum)
Michael Montgomery , Thank you for taking time to come for your Medicare Wellness Visit. I appreciate your ongoing commitment to your health goals. Please review the following plan we discussed and let me know if I can assist you in the future.   These are the goals we discussed:  Goals       Patient Stated     Prevent falls (pt-stated)        This is a list of the screening recommended for you and due dates:  Health Maintenance  Topic Date Due   Colon Cancer Screening  08/25/2022*   Zoster (Shingles) Vaccine (1 of 2) 10/29/2022*   Flu Shot  01/22/2023*   Hemoglobin A1C  11/25/2022   Complete foot exam   03/08/2023   Eye exam for diabetics  03/23/2023   Yearly kidney function blood test for diabetes  05/26/2023   Yearly kidney health urinalysis for diabetes  05/26/2023   Tetanus Vaccine  06/08/2029   Hepatitis C Screening: USPSTF Recommendation to screen - Ages 18-79 yo.  Completed   HIV Screening  Completed   HPV Vaccine  Aged Out   COVID-19 Vaccine  Discontinued  *Topic was postponed. The date shown is not the original due date.   Colonoscopy- discuss in more detail with PCP at next office visit 08/26/22. Request to hospital stay while prepping for colonoscopy.   Advanced directives: not yet completed  Next appointment: Follow up in one year for your annual wellness visit   Preventive Care 40-64 Years, Male Preventive care refers to lifestyle choices and visits with your health care provider that can promote health and wellness. What does preventive care include? A yearly physical exam. This is also called an annual well check. Dental exams once or twice a year. Routine eye exams. Ask your health care provider how often you should have your eyes checked. Personal lifestyle choices, including: Daily care of your teeth and gums. Regular physical activity. Eating a healthy diet. Avoiding tobacco and drug use. Limiting alcohol use. Practicing safe sex. Taking low-dose aspirin  every day starting at age 25. What happens during an annual well check? The services and screenings done by your health care provider during your annual well check will depend on your age, overall health, lifestyle risk factors, and family history of disease. Counseling  Your health care provider may ask you questions about your: Alcohol use. Tobacco use. Drug use. Emotional well-being. Home and relationship well-being. Sexual activity. Eating habits. Work and work Statistician. Screening  You may have the following tests or measurements: Height, weight, and BMI. Blood pressure. Lipid and cholesterol levels. These may be checked every 5 years, or more frequently if you are over 52 years old. Skin check. Lung cancer screening. You may have this screening every year starting at age 14 if you have a 30-pack-year history of smoking and currently smoke or have quit within the past 15 years. Fecal occult blood test (FOBT) of the stool. You may have this test every year starting at age 65. Flexible sigmoidoscopy or colonoscopy. You may have a sigmoidoscopy every 5 years or a colonoscopy every 10 years starting at age 39. Prostate cancer screening. Recommendations will vary depending on your family history and other risks. Hepatitis C blood test. Hepatitis B blood test. Sexually transmitted disease (STD) testing. Diabetes screening. This is done by checking your blood sugar (glucose) after you have not eaten for a while (fasting). You may have this done every 1-3 years. Discuss your test  results, treatment options, and if necessary, the need for more tests with your health care provider. Vaccines  Your health care provider may recommend certain vaccines, such as: Influenza vaccine. This is recommended every year. Tetanus, diphtheria, and acellular pertussis (Tdap, Td) vaccine. You may need a Td booster every 10 years. Zoster vaccine. You may need this after age 70. Pneumococcal 13-valent  conjugate (PCV13) vaccine. You may need this if you have certain conditions and have not been vaccinated. Pneumococcal polysaccharide (PPSV23) vaccine. You may need one or two doses if you smoke cigarettes or if you have certain conditions. Talk to your health care provider about which screenings and vaccines you need and how often you need them. This information is not intended to replace advice given to you by your health care provider. Make sure you discuss any questions you have with your health care provider. Document Released: 11/06/2015 Document Revised: 06/29/2016 Document Reviewed: 08/11/2015 Elsevier Interactive Patient Education  2017 [employer] Prevention in the Home Falls can cause injuries. They can happen to people of all ages. There are many things you can do to make your home safe and to help prevent falls. What can I do on the outside of my home? Regularly fix the edges of walkways and driveways and fix any cracks. Remove anything that might make you trip as you walk through a door, such as a raised step or threshold. Trim any bushes or trees on the path to your home. Use bright outdoor lighting. Clear any walking paths of anything that might make someone trip, such as rocks or tools. Regularly check to see if handrails are loose or broken. Make sure that both sides of any steps have handrails. Any raised decks and porches should have guardrails on the edges. Have any leaves, snow, or ice cleared regularly. Use sand or salt on walking paths during winter. Clean up any spills in your garage right away. This includes oil or grease spills. What can I do in the bathroom? Use night lights. Install grab bars by the toilet and in the tub and shower. Do not use towel bars as grab bars. Use non-skid mats or decals in the tub or shower. If you need to sit down in the shower, use a plastic, non-slip stool. Keep the floor dry. Clean up any water that spills on the floor as  soon as it happens. Remove soap buildup in the tub or shower regularly. Attach bath mats securely with double-sided non-slip rug tape. Do not have throw rugs and other things on the floor that can make you trip. What can I do in the bedroom? Use night lights. Make sure that you have a light by your bed that is easy to reach. Do not use any sheets or blankets that are too big for your bed. They should not hang down onto the floor. Have a firm chair that has side arms. You can use this for support while you get dressed. Do not have throw rugs and other things on the floor that can make you trip. What can I do in the kitchen? Clean up any spills right away. Avoid walking on wet floors. Keep items that you use a lot in easy-to-reach places. If you need to reach something above you, use a strong step stool that has a grab bar. Keep electrical cords out of the way. Do not use floor polish or wax that makes floors slippery. If you must use wax, use non-skid floor wax. Do  not have throw rugs and other things on the floor that can make you trip. What can I do with my stairs? Do not leave any items on the stairs. Make sure that there are handrails on both sides of the stairs and use them. Fix handrails that are broken or loose. Make sure that handrails are as long as the stairways. Check any carpeting to make sure that it is firmly attached to the stairs. Fix any carpet that is loose or worn. Avoid having throw rugs at the top or bottom of the stairs. If you do have throw rugs, attach them to the floor with carpet tape. Make sure that you have a light switch at the top of the stairs and the bottom of the stairs. If you do not have them, ask someone to add them for you. What else can I do to help prevent falls? Wear shoes that: Do not have high heels. Have rubber bottoms. Are comfortable and fit you well. Are closed at the toe. Do not wear sandals. If you use a stepladder: Make sure that it is  fully opened. Do not climb a closed stepladder. Make sure that both sides of the stepladder are locked into place. Ask someone to hold it for you, if possible. Clearly mark and make sure that you can see: Any grab bars or handrails. First and last steps. Where the edge of each step is. Use tools that help you move around (mobility aids) if they are needed. These include: Canes. Walkers. Scooters. Crutches. Turn on the lights when you go into a dark area. Replace any light bulbs as soon as they burn out. Set up your furniture so you have a clear path. Avoid moving your furniture around. If any of your floors are uneven, fix them. If there are any pets around you, be aware of where they are. Review your medicines with your doctor. Some medicines can make you feel dizzy. This can increase your chance of falling. Ask your doctor what other things that you can do to help prevent falls. This information is not intended to replace advice given to you by your health care provider. Make sure you discuss any questions you have with your health care provider. Document Released: 08/06/2009 Document Revised: 03/17/2016 Document Reviewed: 11/14/2014 Elsevier Interactive Patient Education  2017 [employer].

## 2022-07-29 NOTE — Progress Notes (Signed)
Subjective:   Michael Montgomery is a 61 y.o. male who presents for Medicare Annual/Subsequent preventive examination.  Review of Systems    No ROS.  Medicare Wellness Virtual Visit.  Visual/audio telehealth visit, UTA vital signs.   See social history for additional risk factors.   Cardiac Risk Factors include: advanced age (>35mn, >>59women);diabetes mellitus;male gender;hypertension     Objective:    Today's Vitals   07/29/22 1333  BP: 125/78  Pulse: 85  Weight: 158 lb (71.7 kg)  Height: 5' 11"  (1.803 m)   Body mass index is 22.04 kg/m.     07/29/2022    1:42 PM 12/23/2021   10:10 PM 12/23/2021   12:50 PM 07/14/2021   11:43 AM 08/05/2020    2:02 PM 07/29/2020    4:41 PM 07/13/2020   11:33 AM  Advanced Directives  Does Patient Have a Medical Advance Directive? No No No Yes No No No  Type of AScientist, research (medical)Living will     Does patient want to make changes to medical advance directive?    No - Patient declined     Copy of HPrattin Chart?    No - copy requested     Would patient like information on creating a medical advance directive? No - Patient declined No - Patient declined   No - Patient declined No - Patient declined Yes (MAU/Ambulatory/Procedural Areas - Information given)    Current Medications (verified) Outpatient Encounter Medications as of 07/29/2022  Medication Sig   Accu-Chek Softclix Lancets lancets 1 each by Other route daily. Check sugar fasting daily. Dx Code E11.9.   amLODipine (NORVASC) 10 MG tablet TAKE 1 TABLET BY MOUTH DAILY   aspirin EC 81 MG tablet Take 81 mg by mouth daily. Swallow whole.   atorvastatin (LIPITOR) 40 MG tablet TAKE 1 TABLET BY MOUTH EVERY DAY   Blood Glucose Monitoring Suppl (ACCU-CHEK GUIDE ME) w/Device KIT USE ONCE DAILY FASTING. DX CODE E11.9.   buPROPion (WELLBUTRIN XL) 300 MG 24 hr tablet Take by mouth.   fluticasone (FLONASE) 50 MCG/ACT nasal spray SPRAY 2 SPRAYS INTO  EACH NOSTRIL EVERY DAY   gabapentin (NEURONTIN) 100 MG capsule Take 200 mg by mouth 3 (three) times daily.   GAMUNEX-C 20 GM/200ML SOLN    glucose blood test strip Use once daily fasting. Dx Code E11.9   hydrochlorothiazide (MICROZIDE) 12.5 MG capsule TAKE 1 CAPSULE BY MOUTH EVERY DAY   LINZESS 72 MCG capsule TAKE 1 CAPSULE BY MOUTH EVERY DAY BEFORE BREAKFAST   Multiple Vitamin (MULTIVITAMIN) capsule Take 1 capsule by mouth daily.   Nutritional Supplements (ENSURE ACTIVE HIGH PROTEIN) LIQD Take 1 Can by mouth 3 (three) times daily as needed.   Omega-3 Fatty Acids (FISH OIL PO) Take 1 capsule by mouth daily.    ondansetron (ZOFRAN) 4 MG tablet Take 1 tablet (4 mg total) by mouth every 8 (eight) hours as needed for nausea or vomiting.   OZEMPIC, 0.25 OR 0.5 MG/DOSE, 2 MG/3ML SOPN INJECT 0.25 MG SUBCUTANEOUSLY  WEEKLY FOR 4 WEEKS, THEN 0.5 MG  WEEKLY THEREAFTER   sodium chloride 0.9 % infusion Inject into the vein.   triamcinolone cream (KENALOG) 0.1 % APPLY TO AFFECTED AREA TWICE A DAY   No facility-administered encounter medications on file as of 07/29/2022.    Allergies (verified) Patient has no known allergies.   History: Past Medical History:  Diagnosis Date   Cataract march, april  bilateral removed    Chickenpox    Diabetes mellitus without complication ([place])    metformin    Diabetic amyotrophy [facility])    sees dr at [facility]   Gastric ulcer    Heart murmur    When he was younger, no recent issues   High blood pressure    controlled with meds   Hypercholesteremia    controlled with medication   Knee injury    left   Motion sickness    boats   Neuropathy    Past Surgical History:  Procedure Laterality Date   CATARACT EXTRACTION W/PHACO Right 02/01/2016   Procedure: CATARACT EXTRACTION PHACO AND INTRAOCULAR LENS PLACEMENT ([place]) right;  Surgeon: Ronnell Freshwater, MD;  Location: [place];  Service: Ophthalmology;  Laterality: Right;  DIABETIC - oral meds    CATARACT EXTRACTION W/PHACO Left 03/07/2016   Procedure: CATARACT EXTRACTION PHACO AND INTRAOCULAR LENS PLACEMENT ([facility]);  Surgeon: Ronnell Freshwater, MD;  Location: [place];  Service: Ophthalmology;  Laterality: Left;  DIABETIC - oral meds    circumscision     Family History  Problem Relation Age of Onset   Alcoholism Father    Hyperlipidemia Father    Hypertension Father    Throat cancer Father        smoked but had stopped 87 yeras before dx   Diabetes Mother    Emphysema Mother    Kidney disease Neg Hx    Prostate cancer Neg Hx    Colon cancer Neg Hx    Colon polyps Neg Hx    Esophageal cancer Neg Hx    Rectal cancer Neg Hx    Stomach cancer Neg Hx    Kidney cancer Neg Hx    Bladder Cancer Neg Hx    Social History   Socioeconomic History   Marital status: Married    Spouse name: Dadrian Ballantine   Number of children: 2   Years of education: 12   Highest education level: 12th grade  Occupational History   Occupation: Disabled  Tobacco Use   Smoking status: Former    Types: Cigars   Smokeless tobacco: Never   Tobacco comments:    one cigar once a month to once every 2 months   Vaping Use   Vaping Use: Never used  Substance and Sexual Activity   Alcohol use: Yes    Alcohol/week: 5.0 standard drinks of alcohol    Types: 5 Shots of liquor per week    Comment: pt stated " every once in a while"   Drug use: No   Sexual activity: Not Currently  Other Topics Concern   Not on file  Social History Narrative   Not on file   Social Determinants of Health   Financial Resource Strain: Low Risk  (07/29/2022)   Overall Financial Resource Strain (CARDIA)    Difficulty of Paying Living Expenses: Not hard at all  Food Insecurity: No Food Insecurity (07/29/2022)   Hunger Vital Sign    Worried About Running Out of Food in the Last Year: Never true    [place] in the Last Year: Never true  Transportation Needs: No Transportation Needs (07/29/2022)    PRAPARE - Hydrologist (Medical): No    Lack of Transportation (Non-Medical): No  Physical Activity: Inactive (08/05/2020)   Exercise Vital Sign    Days of Exercise per Week: 0 days    Minutes of Exercise per Session: 0 min  Stress: No Stress Concern Present (07/29/2022)   [place]    Feeling of Stress : Not at all  Social Connections: Moderately Integrated (07/29/2022)   Social Connection and Isolation Panel [NHANES]    Frequency of Communication with Friends and Family: More than three times a week    Frequency of Social Gatherings with Friends and Family: More than three times a week    Attends Religious Services: More than 4 times per year    Active Member of [employer] or Organizations: No    Attends Archivist Meetings: Never    Marital Status: Married    Tobacco Counseling Counseling given: Not Answered Tobacco comments: one cigar once a month to once every 2 months    Clinical Intake:                          Activities of Daily Living    07/29/2022    1:44 PM 12/23/2021   10:10 PM  In your present state of health, do you have any difficulty performing the following activities:  Hearing? 0 0  Vision? 0 0  Difficulty concentrating or making decisions? 0 0  Walking or climbing stairs? 1 1  Comment Wheelchair in use   Dressing or bathing? 0 1  Doing errands, shopping? 1 0  Comment Assist needed   Preparing Food and eating ? Y   Using the Toilet? N   In the past six months, have you accidently leaked urine? N   Do you have problems with loss of bowel control? N   Managing your Medications? N   Managing your Finances? N   Housekeeping or managing your Housekeeping? Y     Patient Care Team: Leone Haven, MD as PCP - General (Family Medicine)  Indicate any recent Medical Services you may have received from other than [facility] providers in the past  year (date may be approximate).     Assessment:   This is a routine wellness examination for Nixon.  I connected with  Alysia Penna Lohnes on 07/29/22 by a audio enabled telemedicine application and verified that I am speaking with the correct person using two identifiers.  Patient Location: Home  Provider Location: Office/Clinic  I discussed the limitations of evaluation and management by telemedicine. The patient expressed understanding and agreed to proceed.   Hearing/Vision screen Hearing Screening - Comments:: Patient is able to hear conversational tones without difficulty. No issues reported. Vision Screening - Comments:: Followed by [facility]  Wears corrective lenses  They have seen their ophthalmologist in the last 12 months  No retinopathy  Dietary issues and exercise activities discussed: Current Exercise Habits: Home exercise routine, Type of exercise: calisthenics;strength training/weights;stretching (Upper body pull ups), Intensity: Mild Diabetic diet Ensure High Protein  Good water intake   Goals Addressed               This Visit's Progress     Patient Stated     Prevent falls (pt-stated)   On track      Depression Screen    07/29/2022    1:40 PM 05/25/2022    2:29 PM 07/14/2021   11:24 AM 04/20/2021    1:45 PM 09/11/2020    8:09 AM 08/05/2020    2:41 PM 07/29/2020    4:39 PM  PHQ 2/9 Scores  PHQ - 2 Score 0 0 0 0 0 1 1  PHQ- 9 Score  10      Fall Risk    07/29/2022    1:42 PM 05/25/2022    2:29 PM 07/14/2021   11:45 AM 04/20/2021    1:44 PM 09/11/2020    8:09 AM  Fall Risk   Falls in the past year? 0 0 0 0 0  Number falls in past yr: 0 0 0 0 0  Injury with Fall?  0 0  0  Risk for fall due to : Impaired balance/gait;Impaired mobility No Fall Risks     Risk for fall due to: Comment Wheelchair in use      Follow up Falls evaluation completed Falls evaluation completed Falls evaluation completed Falls evaluation completed Falls evaluation  completed    [place]: Home free of loose throw rugs in walkways, pet beds, electrical cords, etc? Yes  Adequate lighting in your home to reduce risk of falls? Yes   ASSISTIVE DEVICES UTILIZED TO PREVENT FALLS: Life alert? Yes  Use of a cane, walker or w/c? Yes  Grab bars in the bathroom? Yes  Shower chair or bench in shower? Yes  Elevated toilet seat or a handicapped toilet? No  BSC? Yes  TIMED UP AND GO: Was the test performed? No .   Cognitive Function:        07/29/2022    1:54 PM 07/11/2019   11:24 AM  6CIT Screen  What Year? 0 points 0 points  What month? 0 points 0 points  What time? 0 points 0 points  Count back from 20 0 points 0 points  Months in reverse 0 points 0 points  Repeat phrase 0 points 0 points  Total Score 0 points 0 points    Immunizations Immunization History  Administered Date(s) Administered   Influenza,inj,Quad PF,6+ Mos 06/17/2017, 06/21/2018, 06/09/2019   Influenza-Unspecified 08/24/2016, 06/21/2018, 07/12/2018   Pneumococcal Polysaccharide-23 12/08/2016   Tdap 12/08/2016, 06/09/2019   Flu Vaccine status: Due, Education has been provided regarding the importance of this vaccine. Advised may receive this vaccine at local pharmacy or Health Dept. Aware to provide a copy of the vaccination record if obtained from local pharmacy or Health Dept. Verbalized acceptance and understanding.  Covid-19 vaccine status: Completed vaccines  Shingrix Completed?: No.    Education has been provided regarding the importance of this vaccine. Patient has been advised to call insurance company to determine out of pocket expense if they have not yet received this vaccine. Advised may also receive vaccine at local pharmacy or Health Dept. Verbalized acceptance and understanding.  Screening Tests Health Maintenance  Topic Date Due   COLONOSCOPY (Pts 45-49yr Insurance coverage will need to be confirmed)  08/25/2022 (Originally  03/31/2021)   Zoster Vaccines- Shingrix (1 of 2) 10/29/2022 (Originally [birth year]1982)   INFLUENZA VACCINE  01/22/2023 (Originally 05/24/2022)   HEMOGLOBIN A1C  11/25/2022   FOOT EXAM  03/08/2023   OPHTHALMOLOGY EXAM  03/23/2023   Diabetic kidney evaluation - GFR measurement  05/26/2023   Diabetic kidney evaluation - Urine ACR  05/26/2023   TETANUS/TDAP  06/08/2029   Hepatitis C Screening  Completed   HIV Screening  Completed   HPV VACCINES  Aged Out   COVID-19 Vaccine  Discontinued   Health Maintenance There are no preventive care reminders to display for this patient.  Colonoscopy- deferred per patient.   Lung Cancer Screening: (Low Dose CT Chest recommended if Age 59-80years, 30 pack-year currently smoking OR have quit w/in 15years.) does not qualify.   Hepatitis  C Screening: Completed 2022.   Vision Screening: Recommended annual ophthalmology exams for early detection of glaucoma and other disorders of the eye.  Dental Screening: Recommended annual dental exams for proper oral hygiene  Community Resource Referral / Chronic Care Management: CRR required this visit?  No   CCM required this visit?  No      Plan:     I have personally reviewed and noted the following in the patient's chart:   Medical and social history Use of alcohol, tobacco or illicit drugs  Current medications and supplements including opioid prescriptions. Patient is not currently taking opioid prescriptions. Functional ability and status Nutritional status Physical activity Advanced directives List of other physicians Hospitalizations, surgeries, and ER visits in previous 12 months Vitals Screenings to include cognitive, depression, and falls Referrals and appointments  In addition, I have reviewed and discussed with patient certain preventive protocols, quality metrics, and best practice recommendations. A written personalized care plan for preventive services as well as general preventive health  recommendations were provided to patient.     Varney Biles, LPN   [id number]

## 2022-08-02 DIAGNOSIS — E1151 Type 2 diabetes mellitus with diabetic peripheral angiopathy without gangrene: Secondary | ICD-10-CM | POA: Diagnosis not present

## 2022-08-02 DIAGNOSIS — E1165 Type 2 diabetes mellitus with hyperglycemia: Secondary | ICD-10-CM | POA: Diagnosis not present

## 2022-08-02 DIAGNOSIS — Z7984 Long term (current) use of oral hypoglycemic drugs: Secondary | ICD-10-CM | POA: Diagnosis not present

## 2022-08-02 DIAGNOSIS — G6181 Chronic inflammatory demyelinating polyneuritis: Secondary | ICD-10-CM | POA: Diagnosis not present

## 2022-08-02 DIAGNOSIS — Z87891 Personal history of nicotine dependence: Secondary | ICD-10-CM | POA: Diagnosis not present

## 2022-08-02 DIAGNOSIS — I10 Essential (primary) hypertension: Secondary | ICD-10-CM | POA: Diagnosis not present

## 2022-08-04 ENCOUNTER — Ambulatory Visit: Payer: Medicare Other | Admitting: Urology

## 2022-08-08 DIAGNOSIS — G6181 Chronic inflammatory demyelinating polyneuritis: Secondary | ICD-10-CM | POA: Diagnosis not present

## 2022-08-09 DIAGNOSIS — G6181 Chronic inflammatory demyelinating polyneuritis: Secondary | ICD-10-CM | POA: Diagnosis not present

## 2022-08-10 ENCOUNTER — Telehealth: Payer: Self-pay | Admitting: Family Medicine

## 2022-08-10 NOTE — Telephone Encounter (Signed)
Noted. Form signed. Please fax back.

## 2022-08-10 NOTE — Telephone Encounter (Signed)
I called and spoke with the patient and informed him that the provider wanted to know if he could control his bowels and/or bladder, patient stated he can control both its just that at night is when he mostly uses the supplies because no one is there to help him go to the bathroom.  Lady Wisham,cma

## 2022-08-10 NOTE — Telephone Encounter (Signed)
I received orders for incontinence supplies for this patient. Can you call and ask him if he is able to control his bowels and/or bladder?

## 2022-08-11 ENCOUNTER — Ambulatory Visit: Payer: Medicare Other | Admitting: Podiatry

## 2022-08-11 DIAGNOSIS — G6181 Chronic inflammatory demyelinating polyneuritis: Secondary | ICD-10-CM | POA: Diagnosis not present

## 2022-08-11 NOTE — Telephone Encounter (Signed)
Faxed the form and confirmation given. Sherrel Shafer,cma

## 2022-08-12 DIAGNOSIS — G6181 Chronic inflammatory demyelinating polyneuritis: Secondary | ICD-10-CM | POA: Diagnosis not present

## 2022-08-16 ENCOUNTER — Encounter (INDEPENDENT_AMBULATORY_CARE_PROVIDER_SITE_OTHER): Payer: Medicare Other

## 2022-08-16 ENCOUNTER — Encounter (INDEPENDENT_AMBULATORY_CARE_PROVIDER_SITE_OTHER): Payer: Medicare Other | Admitting: Nurse Practitioner

## 2022-08-16 NOTE — Telephone Encounter (Signed)
Needing clinic notes confirming that the patient is needing the incontinence supplies.

## 2022-08-16 NOTE — Telephone Encounter (Signed)
Called the patient and informed him that Almeta Monas is needing clinical notes for incontinence and on only note mentioning this was in 2020, I called to see about scheduling him and he stated he wanted to discuss this at his December visit, he has enough supplies until then.  Layce Sprung,cma

## 2022-08-17 NOTE — Telephone Encounter (Signed)
Noted  

## 2022-08-19 ENCOUNTER — Telehealth: Payer: Self-pay | Admitting: Family Medicine

## 2022-08-19 NOTE — Telephone Encounter (Signed)
Patient called and stated that he has a open wound on the rt foot around the hammer toe, and left foot has open wounds, (more like cracks). He is requesting a home health nurse to come into the home to care for the wounds.

## 2022-08-22 ENCOUNTER — Encounter (INDEPENDENT_AMBULATORY_CARE_PROVIDER_SITE_OTHER): Payer: Self-pay

## 2022-08-22 NOTE — Telephone Encounter (Signed)
Can he come in to the office for his visit later this week? It would be best if he could so we could discuss this and that would allow for me to place the home health referral in compliance with insurance requirements. Thanks.

## 2022-08-22 NOTE — Telephone Encounter (Signed)
Patient is scheduled for this Friday at 3:30 pm and he stated his problem is transportation but he will see what he can do,.  Michael Montgomery,cma

## 2022-08-22 NOTE — Telephone Encounter (Signed)
Appointment changed to mychart video visit.  Michael Montgomery,cma

## 2022-08-22 NOTE — Telephone Encounter (Signed)
Noted. I can still see him if he can not make it in person.

## 2022-08-26 ENCOUNTER — Telehealth (INDEPENDENT_AMBULATORY_CARE_PROVIDER_SITE_OTHER): Payer: Medicare Other | Admitting: Family Medicine

## 2022-08-26 ENCOUNTER — Encounter: Payer: Self-pay | Admitting: Family Medicine

## 2022-08-26 VITALS — Ht 71.0 in | Wt 157.0 lb

## 2022-08-26 DIAGNOSIS — G6181 Chronic inflammatory demyelinating polyneuritis: Secondary | ICD-10-CM

## 2022-08-26 DIAGNOSIS — M2040 Other hammer toe(s) (acquired), unspecified foot: Secondary | ICD-10-CM | POA: Diagnosis not present

## 2022-08-26 DIAGNOSIS — Z86010 Personal history of colonic polyps: Secondary | ICD-10-CM | POA: Diagnosis not present

## 2022-08-26 DIAGNOSIS — E119 Type 2 diabetes mellitus without complications: Secondary | ICD-10-CM

## 2022-08-26 NOTE — Assessment & Plan Note (Signed)
Sugars seem to be better.  He will come in for an A1c in about a month when he is able to arrange for transportation.  He will continue Ozempic 0.5 mg weekly.

## 2022-08-26 NOTE — Assessment & Plan Note (Signed)
He will proceed with evaluation for vascular surgery and then continue to see his surgeon for his hammertoes.

## 2022-08-26 NOTE — Assessment & Plan Note (Signed)
Limits ability to mobilize himself to use the bathroom.  He can continue incontinence supplies.  I will continue to see neurology.

## 2022-08-26 NOTE — Assessment & Plan Note (Signed)
Given his CIDP and impaired mobility he is not able to participate in a cleanout prior to a colonoscopy.  Cologuard is not an option given that he had prior polyps.  We will do yearly stool testing.  This will be mailed to the patient.

## 2022-08-26 NOTE — Progress Notes (Signed)
Virtual Visit via video Note  This visit type was conducted due to national recommendations for restrictions regarding the COVID-19 pandemic (e.g. social distancing).  This format is felt to be most appropriate for this patient at this time.  All issues noted in this document were discussed and addressed.  No physical exam was performed (except for noted visual exam findings with Video Visits).   I connected with Michael Montgomery today at  3:30 PM EDT by a video enabled telemedicine application or telephone and verified that I am speaking with the correct person using two identifiers. Location patient: home Location provider: work  Persons participating in the virtual visit: patient, provider  I discussed the limitations, risks, security and privacy concerns of performing an evaluation and management service by telephone and the availability of in person appointments. I also discussed with the patient that there may be a patient responsible charge related to this service. The patient expressed understanding and agreed to proceed.   Reason for visit: f/u.  HPI: DIABETES Disease Monitoring: Blood Sugar ranges-112-120 Polyuria/phagia/dipsia- no       Medications: Compliance- ozempic  Hypoglycemic symptoms- no  CIDP: Patient has trouble getting around and at times is not able to get into a bathroom or to his bedside commode.  He uses incontinence supplies for this.  He is in the process of getting his wheelchair fixed and has a shower chair coming so that will be able to do use the bathroom more easily.  He continues to follow with neurology.  Hammertoes: Patient reports he has seen a Psychologist, sport and exercise for this.  They have referred him to a vascular surgeon to have his blood flow checked before having surgery on his feet.    ROS: See pertinent positives and negatives per HPI.  Past Medical History:  Diagnosis Date   Cataract march, april   bilateral removed    Chickenpox    Diabetes mellitus  without complication ([place])    metformin    Diabetic amyotrophy ([place])    sees dr at [facility]   Gastric ulcer    Heart murmur    When he was younger, no recent issues   High blood pressure    controlled with meds   Hypercholesteremia    controlled with medication   Knee injury    left   Motion sickness    boats   Neuropathy     Past Surgical History:  Procedure Laterality Date   CATARACT EXTRACTION W/PHACO Right 02/01/2016   Procedure: CATARACT EXTRACTION PHACO AND INTRAOCULAR LENS PLACEMENT ([place]) right;  Surgeon: Ronnell Freshwater, MD;  Location: [place];  Service: Ophthalmology;  Laterality: Right;  DIABETIC - oral meds   CATARACT EXTRACTION W/PHACO Left 03/07/2016   Procedure: CATARACT EXTRACTION PHACO AND INTRAOCULAR LENS PLACEMENT ([facility]);  Surgeon: Ronnell Freshwater, MD;  Location: [place];  Service: Ophthalmology;  Laterality: Left;  DIABETIC - oral meds    circumscision      Family History  Problem Relation Age of Onset   Alcoholism Father    Hyperlipidemia Father    Hypertension Father    Throat cancer Father        smoked but had stopped 16 yeras before dx   Diabetes Mother    Emphysema Mother    Kidney disease Neg Hx    Prostate cancer Neg Hx    Colon cancer Neg Hx    Colon polyps Neg Hx    Esophageal cancer Neg Hx  Rectal cancer Neg Hx    Stomach cancer Neg Hx    Kidney cancer Neg Hx    Bladder Cancer Neg Hx     SOCIAL HX: Former smoker   Current Outpatient Medications:    Accu-Chek Softclix Lancets lancets, 1 each by Other route daily. Check sugar fasting daily. Dx Code E11.9., Disp: 100 each, Rfl: 3   amLODipine (NORVASC) 10 MG tablet, TAKE 1 TABLET BY MOUTH DAILY, Disp: 100 tablet, Rfl: 2   aspirin EC 81 MG tablet, Take 81 mg by mouth daily. Swallow whole., Disp: , Rfl:    atorvastatin (LIPITOR) 40 MG tablet, TAKE 1 TABLET BY MOUTH EVERY DAY, Disp: 90 tablet, Rfl: 1   Blood Glucose Monitoring Suppl (ACCU-CHEK GUIDE  ME) w/Device KIT, USE ONCE DAILY FASTING. DX CODE E11.9., Disp: 1 kit, Rfl: 0   buPROPion (WELLBUTRIN XL) 300 MG 24 hr tablet, Take by mouth., Disp: , Rfl:    fluticasone (FLONASE) 50 MCG/ACT nasal spray, SPRAY 2 SPRAYS INTO EACH NOSTRIL EVERY DAY, Disp: 48 mL, Rfl: 1   gabapentin (NEURONTIN) 100 MG capsule, Take 200 mg by mouth 3 (three) times daily., Disp: , Rfl:    GAMUNEX-C 20 GM/200ML SOLN, , Disp: , Rfl:    glucose blood test strip, Use once daily fasting. Dx Code E11.9, Disp: 100 each, Rfl: 12   hydrochlorothiazide (MICROZIDE) 12.5 MG capsule, TAKE 1 CAPSULE BY MOUTH EVERY DAY, Disp: 90 capsule, Rfl: 1   LINZESS 72 MCG capsule, TAKE 1 CAPSULE BY MOUTH EVERY DAY BEFORE BREAKFAST, Disp: 30 capsule, Rfl: 1   Multiple Vitamin (MULTIVITAMIN) capsule, Take 1 capsule by mouth daily., Disp: , Rfl:    Nutritional Supplements (ENSURE ACTIVE HIGH PROTEIN) LIQD, Take 1 Can by mouth 3 (three) times daily as needed., Disp: 21330 mL, Rfl: 11   Omega-3 Fatty Acids (FISH OIL PO), Take 1 capsule by mouth daily. , Disp: , Rfl:    ondansetron (ZOFRAN) 4 MG tablet, Take 1 tablet (4 mg total) by mouth every 8 (eight) hours as needed for nausea or vomiting., Disp: 20 tablet, Rfl: 0   OZEMPIC, 0.25 OR 0.5 MG/DOSE, 2 MG/3ML SOPN, INJECT 0.25 MG SUBCUTANEOUSLY  WEEKLY FOR 4 WEEKS, THEN 0.5 MG  WEEKLY THEREAFTER, Disp: 9 mL, Rfl: 3   sodium chloride 0.9 % infusion, Inject into the vein., Disp: , Rfl:    triamcinolone cream (KENALOG) 0.1 %, APPLY TO AFFECTED AREA TWICE A DAY, Disp: 30 g, Rfl: 0  EXAM:  VITALS per patient if applicable:  GENERAL: alert, oriented, appears well and in no acute distress  HEENT: atraumatic, conjunttiva clear, no obvious abnormalities on inspection of external nose and ears  NECK: normal movements of the head and neck  LUNGS: on inspection no signs of respiratory distress, breathing rate appears normal, no obvious gross SOB, gasping or wheezing  CV: no obvious cyanosis  MS: moves  all visible extremities without noticeable abnormality  PSYCH/NEURO: pleasant and cooperative, no obvious depression or anxiety, speech and thought processing grossly intact  ASSESSMENT AND PLAN:  Discussed the following assessment and plan:  Problem List Items Addressed This Visit     CIDP (chronic inflammatory demyelinating polyneuropathy) (HCC) (Chronic)    Limits ability to mobilize himself to use the bathroom.  He can continue incontinence supplies.  I will continue to see neurology.      Diabetes ([place]) - Primary (Chronic)    Sugars seem to be better.  He will come in for an A1c in about a  month when he is able to arrange for transportation.  He will continue Ozempic 0.5 mg weekly.      Relevant Orders   Hemoglobin A1c   Hammertoe    He will proceed with evaluation for vascular surgery and then continue to see his surgeon for his hammertoes.      History of colon polyps    Given his CIDP and impaired mobility he is not able to participate in a cleanout prior to a colonoscopy.  Cologuard is not an option given that he had prior polyps.  We will do yearly stool testing.  This will be mailed to the patient.      Relevant Orders   Fecal occult blood, imunochemical    Return in about 1 month (around 09/25/2022) for A1c, 4 months in person.   I discussed the assessment and treatment plan with the patient. The patient was provided an opportunity to ask questions and all were answered. The patient agreed with the plan and demonstrated an understanding of the instructions.   The patient was advised to call back or seek an in-person evaluation if the symptoms worsen or if the condition fails to improve as anticipated.   [name], MD

## 2022-08-28 ENCOUNTER — Other Ambulatory Visit: Payer: Self-pay | Admitting: Family Medicine

## 2022-09-07 ENCOUNTER — Ambulatory Visit: Payer: Medicare Other | Admitting: Urology

## 2022-09-12 ENCOUNTER — Telehealth: Payer: Self-pay

## 2022-09-12 NOTE — Telephone Encounter (Signed)
Patient states he needs to know if he should be taking both  buPROPion (WELLBUTRIN XL) 300 MG 24 hr tablet and zoloft or just one, and if so, which one.

## 2022-09-13 NOTE — Telephone Encounter (Signed)
Per my last discussion with him on his depression he should just be on the wellbutrin. Has he been taking the zoloft as well?

## 2022-09-13 NOTE — Telephone Encounter (Signed)
Patient would like to know if he should be taking the Wellbutrin XL 300 mg 24 hour tablets and Zoloft or just one? I do not see Zoloft on his medication list. Please advise

## 2022-09-13 NOTE — Telephone Encounter (Signed)
I called and spoke with the patient and he stated he is taking the Wellbutrin only, he wanted to know because the Zoloft came in his mail order med's and that's why he asked, he knew it was stooped, I also informed him that he needed someone to pick up his FOBT stool kit and he understood, he stated social services was sending over paperwork for his yearly evaluation he wanted to let the provider be aware.  Rockey Guarino,cma

## 2022-09-13 NOTE — Telephone Encounter (Signed)
Noted  

## 2022-09-22 ENCOUNTER — Telehealth: Payer: Self-pay | Admitting: Family Medicine

## 2022-09-22 NOTE — Telephone Encounter (Signed)
Kara from [facility] need most recent notes to say the pt is incontinent

## 2022-09-28 ENCOUNTER — Ambulatory Visit: Payer: Medicare Other | Admitting: Urology

## 2022-10-03 DIAGNOSIS — G6181 Chronic inflammatory demyelinating polyneuritis: Secondary | ICD-10-CM | POA: Diagnosis not present

## 2022-10-04 DIAGNOSIS — G6181 Chronic inflammatory demyelinating polyneuritis: Secondary | ICD-10-CM | POA: Diagnosis not present

## 2022-10-04 NOTE — Progress Notes (Signed)
I called the patient after I spoke with Latoya in the lab and she informed  me that the FOBT cannot be mailed he needed to pick it up, I called the patient and informed him and he stated he would have his son pick the kit up.  Romuald Mccaslin,cma

## 2022-10-05 ENCOUNTER — Encounter (INDEPENDENT_AMBULATORY_CARE_PROVIDER_SITE_OTHER): Payer: Medicare Other

## 2022-10-05 ENCOUNTER — Encounter (INDEPENDENT_AMBULATORY_CARE_PROVIDER_SITE_OTHER): Payer: Medicare Other | Admitting: Nurse Practitioner

## 2022-10-05 DIAGNOSIS — G6181 Chronic inflammatory demyelinating polyneuritis: Secondary | ICD-10-CM | POA: Diagnosis not present

## 2022-10-06 DIAGNOSIS — G6181 Chronic inflammatory demyelinating polyneuritis: Secondary | ICD-10-CM | POA: Diagnosis not present

## 2022-10-10 ENCOUNTER — Other Ambulatory Visit: Payer: Self-pay

## 2022-10-10 DIAGNOSIS — G6181 Chronic inflammatory demyelinating polyneuritis: Secondary | ICD-10-CM | POA: Diagnosis not present

## 2022-10-14 ENCOUNTER — Other Ambulatory Visit: Payer: Self-pay | Admitting: Family Medicine

## 2022-10-14 DIAGNOSIS — E785 Hyperlipidemia, unspecified: Secondary | ICD-10-CM

## 2022-10-27 ENCOUNTER — Ambulatory Visit: Payer: Medicare Other | Admitting: Gastroenterology

## 2022-10-27 ENCOUNTER — Ambulatory Visit: Payer: Medicare Other | Admitting: Podiatry

## 2022-10-31 ENCOUNTER — Ambulatory Visit: Payer: Medicare Other | Admitting: Urology

## 2022-11-09 ENCOUNTER — Other Ambulatory Visit: Payer: Self-pay | Admitting: Family Medicine

## 2022-11-09 ENCOUNTER — Encounter (INDEPENDENT_AMBULATORY_CARE_PROVIDER_SITE_OTHER): Payer: 59 | Admitting: Nurse Practitioner

## 2022-11-09 ENCOUNTER — Encounter (INDEPENDENT_AMBULATORY_CARE_PROVIDER_SITE_OTHER): Payer: Medicare Other

## 2022-11-17 ENCOUNTER — Ambulatory Visit: Payer: Medicare Other | Admitting: Urology

## 2022-11-23 ENCOUNTER — Other Ambulatory Visit: Payer: Medicare Other

## 2022-11-24 ENCOUNTER — Ambulatory Visit: Payer: Medicare Other | Admitting: Podiatry

## 2022-11-28 DIAGNOSIS — G6181 Chronic inflammatory demyelinating polyneuritis: Secondary | ICD-10-CM | POA: Diagnosis not present

## 2022-11-29 DIAGNOSIS — G6181 Chronic inflammatory demyelinating polyneuritis: Secondary | ICD-10-CM | POA: Diagnosis not present

## 2022-11-30 DIAGNOSIS — G6181 Chronic inflammatory demyelinating polyneuritis: Secondary | ICD-10-CM | POA: Diagnosis not present

## 2022-12-01 DIAGNOSIS — G6181 Chronic inflammatory demyelinating polyneuritis: Secondary | ICD-10-CM | POA: Diagnosis not present

## 2022-12-05 ENCOUNTER — Other Ambulatory Visit: Payer: Medicare Other

## 2022-12-06 ENCOUNTER — Other Ambulatory Visit (INDEPENDENT_AMBULATORY_CARE_PROVIDER_SITE_OTHER): Payer: 59

## 2022-12-06 DIAGNOSIS — E119 Type 2 diabetes mellitus without complications: Secondary | ICD-10-CM | POA: Diagnosis not present

## 2022-12-07 ENCOUNTER — Ambulatory Visit: Payer: Medicare Other | Admitting: Family Medicine

## 2022-12-07 LAB — HEMOGLOBIN A1C: Hgb A1c MFr Bld: 5 % (ref 4.6–6.5)

## 2022-12-09 ENCOUNTER — Other Ambulatory Visit (INDEPENDENT_AMBULATORY_CARE_PROVIDER_SITE_OTHER): Payer: Self-pay | Admitting: Nurse Practitioner

## 2022-12-09 DIAGNOSIS — R9389 Abnormal findings on diagnostic imaging of other specified body structures: Secondary | ICD-10-CM

## 2022-12-12 ENCOUNTER — Ambulatory Visit: Payer: Medicare Other | Admitting: Urology

## 2022-12-13 ENCOUNTER — Telehealth: Payer: 59

## 2022-12-13 NOTE — Telephone Encounter (Signed)
error 

## 2022-12-14 ENCOUNTER — Ambulatory Visit (INDEPENDENT_AMBULATORY_CARE_PROVIDER_SITE_OTHER): Payer: 59

## 2022-12-14 ENCOUNTER — Encounter (INDEPENDENT_AMBULATORY_CARE_PROVIDER_SITE_OTHER): Payer: Self-pay | Admitting: Nurse Practitioner

## 2022-12-14 ENCOUNTER — Other Ambulatory Visit (HOSPITAL_COMMUNITY): Payer: Self-pay

## 2022-12-14 ENCOUNTER — Ambulatory Visit (INDEPENDENT_AMBULATORY_CARE_PROVIDER_SITE_OTHER): Payer: 59 | Admitting: Nurse Practitioner

## 2022-12-14 VITALS — BP 125/75 | HR 114 | Resp 16 | Wt 158.0 lb

## 2022-12-14 DIAGNOSIS — R9389 Abnormal findings on diagnostic imaging of other specified body structures: Secondary | ICD-10-CM

## 2022-12-14 DIAGNOSIS — I10 Essential (primary) hypertension: Secondary | ICD-10-CM | POA: Diagnosis not present

## 2022-12-14 DIAGNOSIS — E119 Type 2 diabetes mellitus without complications: Secondary | ICD-10-CM

## 2022-12-14 NOTE — Progress Notes (Signed)
Subjective:    Patient ID: Michael Montgomery, male    DOB: 10-16-1961, 62 y.o.   MRN: [id number] Chief Complaint  Patient presents with   New Patient (Initial Visit)    Ref Caryl Bis consult PAD    Michael Montgomery is a 62 year old male who is referred by his primary care provider in regards to an abnormal screening done by home health.  The patient notes he had an abnormal screening, home health previously but is unaware of what those results were.  There is also concern by podiatry that he requires some invasive procedures on his feet but given his diabetes, there was concern that the patient may be able to heal these wounds.  He denies any rest pain like symptoms.  Today noninvasive studies show an ABI of 1.18 bilaterally.  He has good triphasic tibial artery waveforms with normal toe waveforms bilaterally.    Review of Systems  Neurological:  Positive for weakness.  All other systems reviewed and are negative.      Objective:   Physical Exam Vitals reviewed.  HENT:     Head: Normocephalic.  Cardiovascular:     Rate and Rhythm: Normal rate.     Pulses:          Dorsalis pedis pulses are detected w/ Doppler on the right side and detected w/ Doppler on the left side.       Posterior tibial pulses are detected w/ Doppler on the right side and detected w/ Doppler on the left side.  Pulmonary:     Effort: Pulmonary effort is normal.  Skin:    General: Skin is warm and dry.  Neurological:     Mental Status: He is alert and oriented to person, place, and time.     Motor: Weakness present.     Gait: Gait abnormal.  Psychiatric:        Mood and Affect: Mood normal.        Behavior: Behavior normal.        Thought Content: Thought content normal.        Judgment: Judgment normal.     BP 125/75 (BP Location: Left Arm)   Pulse (!) 114   Resp 16   Wt 158 lb (71.7 kg)   BMI 22.04 kg/m   Past Medical History:  Diagnosis Date   Cataract march, april   bilateral removed     Chickenpox    Diabetes mellitus without complication ([place])    metformin    Diabetic amyotrophy ([place])    sees dr at [facility]   Gastric ulcer    Heart murmur    When he was younger, no recent issues   High blood pressure    controlled with meds   Hypercholesteremia    controlled with medication   Knee injury    left   Motion sickness    boats   Neuropathy     Social History   Socioeconomic History   Marital status: Married    Spouse name: Surafel Bulkley   Number of children: 2   Years of education: 12   Highest education level: 12th grade  Occupational History   Occupation: Disabled  Tobacco Use   Smoking status: Former    Types: Cigars   Smokeless tobacco: Never   Tobacco comments:    one cigar once a month to once every 2 months   Vaping Use   Vaping Use: Never used  Substance and Sexual Activity   Alcohol use: Yes  Alcohol/week: 5.0 standard drinks of alcohol    Types: 5 Shots of liquor per week    Comment: pt stated " every once in a while"   Drug use: No   Sexual activity: Not Currently  Other Topics Concern   Not on file  Social History Narrative   Not on file   Social Determinants of Health   Financial Resource Strain: Low Risk  (07/29/2022)   Overall Financial Resource Strain (CARDIA)    Difficulty of Paying Living Expenses: Not hard at all  Food Insecurity: No Food Insecurity (07/29/2022)   Hunger Vital Sign    Worried About Running Out of Food in the Last Year: Never true    [place] in the Last Year: Never true  Transportation Needs: No Transportation Needs (07/29/2022)   PRAPARE - Hydrologist (Medical): No    Lack of Transportation (Non-Medical): No  Physical Activity: Inactive (08/05/2020)   Exercise Vital Sign    Days of Exercise per Week: 0 days    Minutes of Exercise per Session: 0 min  Stress: No Stress Concern Present (07/29/2022)   [place]    Feeling of Stress : Not at all  Social Connections: Moderately Integrated (07/29/2022)   Social Connection and Isolation Panel [NHANES]    Frequency of Communication with Friends and Family: More than three times a week    Frequency of Social Gatherings with Friends and Family: More than three times a week    Attends Religious Services: More than 4 times per year    Active Member of [employer] or Organizations: No    Attends Archivist Meetings: Never    Marital Status: Married  Human resources officer Violence: Not At Risk (07/29/2022)   Humiliation, Afraid, Rape, and Kick questionnaire    Fear of Current or Ex-Partner: No    Emotionally Abused: No    Physically Abused: No    Sexually Abused: No    Past Surgical History:  Procedure Laterality Date   CATARACT EXTRACTION W/PHACO Right 02/01/2016   Procedure: CATARACT EXTRACTION PHACO AND INTRAOCULAR LENS PLACEMENT ([place]) right;  Surgeon: Ronnell Freshwater, MD;  Location: [facility];  Service: Ophthalmology;  Laterality: Right;  DIABETIC - oral meds   CATARACT EXTRACTION W/PHACO Left 03/07/2016   Procedure: CATARACT EXTRACTION PHACO AND INTRAOCULAR LENS PLACEMENT ([facility]);  Surgeon: Ronnell Freshwater, MD;  Location: [facility];  Service: Ophthalmology;  Laterality: Left;  DIABETIC - oral meds    circumscision      Family History  Problem Relation Age of Onset   Alcoholism Father    Hyperlipidemia Father    Hypertension Father    Throat cancer Father        smoked but had stopped 40 yeras before dx   Diabetes Mother    Emphysema Mother    Kidney disease Neg Hx    Prostate cancer Neg Hx    Colon cancer Neg Hx    Colon polyps Neg Hx    Esophageal cancer Neg Hx    Rectal cancer Neg Hx    Stomach cancer Neg Hx    Kidney cancer Neg Hx    Bladder Cancer Neg Hx     No Known Allergies     Latest Ref Rng & Units 12/24/2021    4:57 AM 12/23/2021    1:07 PM 10/10/2019    5:46 PM  CBC  WBC  4.0 -  10.5 K/uL 10.3  18.9  10.5   Hemoglobin 13.0 - 17.0 g/dL 12.1  14.2  14.6   Hematocrit 39.0 - 52.0 % 36.9  45.6  42.9   Platelets 150 - 400 K/uL 216  269  323       CMP     Component Value Date/Time   NA 133 (L) 05/25/2022 1451   K 4.3 05/25/2022 1451   CL 96 05/25/2022 1451   CO2 27 05/25/2022 1451   GLUCOSE 351 (H) 05/25/2022 1451   BUN 13 05/25/2022 1451   CREATININE 0.56 05/25/2022 1451   CREATININE 0.55 (L) 07/12/2019 1550   CALCIUM 9.9 05/25/2022 1451   PROT 7.4 07/26/2022 1310   PROT 6.9 08/06/2019 1358   ALBUMIN 4.4 07/26/2022 1310   ALBUMIN 4.3 08/06/2019 1358   AST 33 07/26/2022 1310   ALT 65 (H) 07/26/2022 1310   ALKPHOS 169 (H) 07/26/2022 1310   BILITOT 0.6 07/26/2022 1310   BILITOT 0.3 08/06/2019 1358   GFRNONAA >60 12/25/2021 0432   GFRAA >60 10/10/2019 1746     No results found.     Assessment & Plan:   1. Abnormal findings on diagnostic imaging of cardiovascular system Noninvasive studies today show no significant evidence of any peripheral arterial disease.  Based on the studies today the patient should be able to he will surgical interventions done by podiatry.  Will have the patient plan to follow-up with [place] on an as-needed basis.  2. Type 2 diabetes mellitus without complication, without long-term current use of insulin (HCC) Continue hypoglycemic medications as already ordered, these medications have been reviewed and there are no changes at this time.  Hgb A1C to be monitored as already arranged by primary service  3. Essential hypertension Continue antihypertensive medications as already ordered, these medications have been reviewed and there are no changes at this time.   Current Outpatient Medications on File Prior to Visit  Medication Sig Dispense Refill   Accu-Chek Softclix Lancets lancets 1 each by Other route daily. Check sugar fasting daily. Dx Code E11.9. 100 each 3   amLODipine (NORVASC) 10 MG tablet TAKE 1 TABLET BY MOUTH  DAILY 100 tablet 2   aspirin EC 81 MG tablet Take 81 mg by mouth daily. Swallow whole.     atorvastatin (LIPITOR) 40 MG tablet TAKE 1 TABLET BY MOUTH EVERY DAY 90 tablet 1   Blood Glucose Monitoring Suppl (ACCU-CHEK GUIDE ME) w/Device KIT USE ONCE DAILY FASTING. DX CODE E11.9. 1 kit 0   buPROPion (WELLBUTRIN XL) 300 MG 24 hr tablet Take by mouth.     fluticasone (FLONASE) 50 MCG/ACT nasal spray SPRAY 2 SPRAYS INTO EACH NOSTRIL EVERY DAY 48 mL 1   gabapentin (NEURONTIN) 100 MG capsule Take 200 mg by mouth 3 (three) times daily.     glucose blood test strip Use once daily fasting. Dx Code E11.9 100 each 12   hydrochlorothiazide (MICROZIDE) 12.5 MG capsule TAKE 1 CAPSULE BY MOUTH EVERY DAY 90 capsule 1   Multiple Vitamin (MULTIVITAMIN) capsule Take 1 capsule by mouth daily.     Nutritional Supplements (ENSURE ACTIVE HIGH PROTEIN) LIQD Take 1 Can by mouth 3 (three) times daily as needed. 21330 mL 11   Omega-3 Fatty Acids (FISH OIL PO) Take 1 capsule by mouth daily.      OZEMPIC, 0.25 OR 0.5 MG/DOSE, 2 MG/3ML SOPN INJECT 0.25 MG SUBCUTANEOUSLY  WEEKLY FOR 4 WEEKS, THEN 0.5 MG  WEEKLY THEREAFTER 9 mL 3   sodium  chloride 0.9 % infusion Inject into the vein.     GAMUNEX-C 20 GM/200ML SOLN      sertraline (ZOLOFT) 100 MG tablet Take 100 mg by mouth daily.     triamcinolone cream (KENALOG) 0.1 % APPLY TO AFFECTED AREA TWICE A DAY 30 g 0   No current facility-administered medications on file prior to visit.    There are no Patient Instructions on file for this visit. No follow-ups on file.   Kris Hartmann, NP

## 2022-12-16 ENCOUNTER — Other Ambulatory Visit (HOSPITAL_COMMUNITY): Payer: Self-pay

## 2022-12-19 ENCOUNTER — Other Ambulatory Visit (HOSPITAL_COMMUNITY): Payer: Self-pay

## 2022-12-19 NOTE — Telephone Encounter (Signed)
Patient Advocate Encounter  Placed a call to the insurance to check the status of the prior authorization for the Ozempic. Per there representative there was no submission of a PA for Ozempic.  Did a test claim for Ozempic and the current 90 day co-pay is, $0.   The patient is insured through [employer]

## 2022-12-20 NOTE — Telephone Encounter (Signed)
Pt advised.

## 2022-12-21 ENCOUNTER — Ambulatory Visit: Payer: Medicare Other | Admitting: Urology

## 2022-12-28 ENCOUNTER — Other Ambulatory Visit: Payer: Self-pay | Admitting: Family Medicine

## 2022-12-29 NOTE — Telephone Encounter (Addendum)
Pt is calling about ozempic medication and PA. Pt stated the medicine is not at the pharmacy

## 2022-12-30 ENCOUNTER — Other Ambulatory Visit: Payer: Self-pay

## 2023-01-02 NOTE — Telephone Encounter (Signed)
I called the patient and informed him that he had 6 months of refills on his ozempic and he needed to call the mail order and let them know to fill it and he understood.  Daziya Redmond,cma

## 2023-01-06 ENCOUNTER — Ambulatory Visit: Payer: 59 | Admitting: Family Medicine

## 2023-01-23 ENCOUNTER — Ambulatory Visit: Payer: Medicare Other | Admitting: Urology

## 2023-01-23 DIAGNOSIS — G6181 Chronic inflammatory demyelinating polyneuritis: Secondary | ICD-10-CM | POA: Diagnosis not present

## 2023-01-31 ENCOUNTER — Ambulatory Visit: Payer: 59 | Admitting: Gastroenterology

## 2023-01-31 DIAGNOSIS — G6181 Chronic inflammatory demyelinating polyneuritis: Secondary | ICD-10-CM | POA: Diagnosis not present

## 2023-02-01 DIAGNOSIS — G6181 Chronic inflammatory demyelinating polyneuritis: Secondary | ICD-10-CM | POA: Diagnosis not present

## 2023-02-02 DIAGNOSIS — G6181 Chronic inflammatory demyelinating polyneuritis: Secondary | ICD-10-CM | POA: Diagnosis not present

## 2023-02-03 DIAGNOSIS — G6181 Chronic inflammatory demyelinating polyneuritis: Secondary | ICD-10-CM | POA: Diagnosis not present

## 2023-02-08 ENCOUNTER — Other Ambulatory Visit: Payer: Self-pay | Admitting: Family Medicine

## 2023-02-09 ENCOUNTER — Ambulatory Visit (INDEPENDENT_AMBULATORY_CARE_PROVIDER_SITE_OTHER): Payer: 59 | Admitting: Podiatry

## 2023-02-09 ENCOUNTER — Encounter: Payer: Self-pay | Admitting: Podiatry

## 2023-02-09 DIAGNOSIS — E119 Type 2 diabetes mellitus without complications: Secondary | ICD-10-CM | POA: Diagnosis not present

## 2023-02-09 DIAGNOSIS — B351 Tinea unguium: Secondary | ICD-10-CM

## 2023-02-09 DIAGNOSIS — M79609 Pain in unspecified limb: Secondary | ICD-10-CM | POA: Diagnosis not present

## 2023-02-09 NOTE — Progress Notes (Signed)
This patient returns to my office for at risk foot care.  This patient requires this care by a professional since this patient will be at risk due to having diabetes.  Patient has developed a nerve condition which developed from Covid. He presents to the office in a wheelchair.  This patient is unable to cut nails himself since the patient cannot reach his nails.These nails are painful walking and wearing shoes.  This patient presents for at risk foot care today.  General Appearance  Alert, conversant and in no acute stress.  Vascular  Dorsalis pedis and posterior tibial  pulses are palpable  bilaterally.  Capillary return is within normal limits  bilaterally. Temperature is within normal limits  bilaterally.  Neurologic  Senn-Weinstein monofilament wire test within normal limits  bilaterally. Muscle power within normal limits bilaterally.  Nails Thick disfigured discolored nails with subungual debris  from hallux to fifth toes bilaterally. No evidence of bacterial infection or drainage bilaterally.  Orthopedic  No limitations of motion  feet .  No crepitus or effusions noted.  Hallux malleus and hammer toes 2-5  B/l.  Skin  normotropic skin with no porokeratosis noted bilaterally.  No signs of infections or ulcers noted.     Onychomycosis  Pain in right toes  Pain in left toes  Consent was obtained for treatment procedures.   Mechanical debridement of nails 1-5  bilaterally performed with a nail nipper.  Filed with dremel without incident.    Return office visit  3 months                   Told patient to return for periodic foot care and evaluation due to potential at risk complications.   Helane Gunther DPM

## 2023-02-16 ENCOUNTER — Ambulatory Visit: Payer: Medicare Other | Admitting: Urology

## 2023-02-16 ENCOUNTER — Ambulatory Visit: Payer: 59 | Admitting: Podiatry

## 2023-02-17 ENCOUNTER — Ambulatory Visit: Payer: 59 | Admitting: Podiatry

## 2023-02-27 ENCOUNTER — Ambulatory Visit (INDEPENDENT_AMBULATORY_CARE_PROVIDER_SITE_OTHER): Payer: 59 | Admitting: Family Medicine

## 2023-02-27 ENCOUNTER — Encounter: Payer: Self-pay | Admitting: Family Medicine

## 2023-02-27 VITALS — BP 118/72 | HR 115 | Temp 98.5°F | Ht 71.0 in

## 2023-02-27 DIAGNOSIS — F330 Major depressive disorder, recurrent, mild: Secondary | ICD-10-CM

## 2023-02-27 DIAGNOSIS — E119 Type 2 diabetes mellitus without complications: Secondary | ICD-10-CM | POA: Diagnosis not present

## 2023-02-27 DIAGNOSIS — Z1211 Encounter for screening for malignant neoplasm of colon: Secondary | ICD-10-CM

## 2023-02-27 DIAGNOSIS — I10 Essential (primary) hypertension: Secondary | ICD-10-CM | POA: Diagnosis not present

## 2023-02-27 DIAGNOSIS — Z7985 Long-term (current) use of injectable non-insulin antidiabetic drugs: Secondary | ICD-10-CM

## 2023-02-27 MED ORDER — AMLODIPINE BESYLATE 10 MG PO TABS: 10.0000 mg | ORAL_TABLET | Freq: Every day | ORAL | 2 refills | Status: DC

## 2023-02-27 NOTE — Patient Instructions (Signed)
Nice to see you. Please complete the stool testing for colon cancer screening.  Please have somebody bring it back to the office. Will contact you with your lab results.

## 2023-02-27 NOTE — Progress Notes (Signed)
Marikay Alar, MD Phone: [phone]  Michael Montgomery is a 62 y.o. male who presents today for f/u.  HYPERTENSION Disease Monitoring Home BP Monitoring 115-120/70, patient reports pulse at home runs 80s-90s. Chest pain- no    Dyspnea- no Medications Compliance-  taking HCTZ, amlodipine.  Edema- no BMET    Component Value Date/Time   NA 133 (L) 05/25/2022 1451   K 4.3 05/25/2022 1451   CL 96 05/25/2022 1451   CO2 27 05/25/2022 1451   GLUCOSE 351 (H) 05/25/2022 1451   BUN 13 05/25/2022 1451   CREATININE 0.56 05/25/2022 1451   CREATININE 0.55 (L) 07/12/2019 1550   CALCIUM 9.9 05/25/2022 1451   GFRNONAA >60 12/25/2021 0432   GFRAA >60 10/10/2019 1746   DIABETES Disease Monitoring: Blood Sugar ranges-80-135 Polyuria/phagia/dipsia- no      Optho- UTD Medications: Compliance- taking ozempic Hypoglycemic symptoms- no  Depression: Patient notes no depression.  No anxiety.  Taking Wellbutrin.  No SI.   Social History   Tobacco Use  Smoking Status Former   Types: Cigars  Smokeless Tobacco Never  Tobacco Comments   one cigar once a month to once every 2 months     Current Outpatient Medications on File Prior to Visit  Medication Sig Dispense Refill   ACCU-CHEK GUIDE test strip USE ONCE DAILY FASTING. DX CODE E11.9 100 strip 12   Accu-Chek Softclix Lancets lancets 1 each by Other route daily. Check sugar fasting daily. Dx Code E11.9. 100 each 3   aspirin EC 81 MG tablet Take 81 mg by mouth daily. Swallow whole.     atorvastatin (LIPITOR) 40 MG tablet TAKE 1 TABLET BY MOUTH EVERY DAY 90 tablet 1   Blood Glucose Monitoring Suppl (ACCU-CHEK GUIDE ME) w/Device KIT USE ONCE DAILY FASTING. DX CODE E11.9. 1 kit 0   fluticasone (FLONASE) 50 MCG/ACT nasal spray SPRAY 2 SPRAYS INTO EACH NOSTRIL EVERY DAY 48 mL 1   gabapentin (NEURONTIN) 100 MG capsule Take 200 mg by mouth 3 (three) times daily.     hydrochlorothiazide (MICROZIDE) 12.5 MG capsule Take 1 capsule (12.5 mg total) by  mouth daily. MUST KEEP APPT FOR FURTHER REFILLS 30 capsule 0   Multiple Vitamin (MULTIVITAMIN) capsule Take 1 capsule by mouth daily.     Nutritional Supplements (ENSURE ACTIVE HIGH PROTEIN) LIQD Take 1 Can by mouth 3 (three) times daily as needed. 21330 mL 11   Omega-3 Fatty Acids (FISH OIL PO) Take 1 capsule by mouth daily.      OZEMPIC, 0.25 OR 0.5 MG/DOSE, 2 MG/3ML SOPN INJECT 0.25 MG SUBCUTANEOUSLY  WEEKLY FOR 4 WEEKS, THEN 0.5 MG  WEEKLY THEREAFTER 9 mL 3   sertraline (ZOLOFT) 100 MG tablet TAKE 1 TABLET BY MOUTH EVERY DAY 90 tablet 2   sodium chloride 0.9 % infusion Inject into the vein.     buPROPion (WELLBUTRIN XL) 300 MG 24 hr tablet Take by mouth.     No current facility-administered medications on file prior to visit.     ROS see history of present illness  Objective  Physical Exam Vitals:   02/27/23 1501  BP: 118/72  Pulse: (!) 115  Temp: 98.5 F (36.9 C)  SpO2: 97%    BP Readings from Last 3 Encounters:  02/27/23 118/72  12/14/22 125/75  07/29/22 125/78   Wt Readings from Last 3 Encounters:  12/14/22 158 lb (71.7 kg)  08/26/22 157 lb (71.2 kg)  07/29/22 158 lb (71.7 kg)    Physical Exam Constitutional:  General: He is not in acute distress.    Appearance: He is not diaphoretic.  Cardiovascular:     Rate and Rhythm: Regular rhythm. Tachycardia present.     Heart sounds: Normal heart sounds.  Pulmonary:     Effort: Pulmonary effort is normal.     Breath sounds: Normal breath sounds.  Skin:    General: Skin is warm and dry.  Neurological:     Mental Status: He is alert.      Assessment/Plan: Please see individual problem list.  Essential hypertension Assessment & Plan: Chronic issue.  Well-controlled.  Continue amlodipine 10 mg once daily and HCTZ 12.5 mg daily. Patient will also monitor his pulse and if it is >100 consistently at rest he will let us know.  Orders: -     Comprehensive metabolic panel  Type 2 diabetes mellitus without  complication, without long-term current use of insulin (HCC) Assessment & Plan: Chronic issue.  Sugars seem well-controlled.  Check A1c.  Continue Ozempic 0.5 mg weekly.   Depression, major, recurrent, mild (HCC) Assessment & Plan: Chronic issue.  Symptomatic.  Continue Wellbutrin 300 mg daily.   Colon cancer screening -     Fecal occult blood, imunochemical; Future  Other orders -     amLODIPine Besylate; Take 1 tablet (10 mg total) by mouth daily.  Dispense: 100 tablet; Refill: 2     Return in about 6 months (around 08/30/2023).   Marikay Alar, MD [facility]

## 2023-02-27 NOTE — Assessment & Plan Note (Signed)
Chronic issue.  Sugars seem well-controlled.  Check A1c.  Continue Ozempic 0.5 mg weekly.

## 2023-02-27 NOTE — Assessment & Plan Note (Signed)
Chronic issue.  Symptomatic.  Continue Wellbutrin 300 mg daily.

## 2023-02-27 NOTE — Assessment & Plan Note (Addendum)
Chronic issue.  Well-controlled.  Continue amlodipine 10 mg once daily and HCTZ 12.5 mg daily. Patient will also monitor his pulse and if it is >100 consistently at rest he will let us know.

## 2023-02-28 LAB — COMPREHENSIVE METABOLIC PANEL
ALT: 30 U/L (ref 0–53)
AST: 25 U/L (ref 0–37)
Albumin: 4.1 g/dL (ref 3.5–5.2)
Alkaline Phosphatase: 125 U/L — ABNORMAL HIGH (ref 39–117)
BUN: 9 mg/dL (ref 6–23)
CO2: 26 mEq/L (ref 19–32)
Calcium: 9.3 mg/dL (ref 8.4–10.5)
Chloride: 102 mEq/L (ref 96–112)
Creatinine, Ser: 0.48 mg/dL (ref 0.40–1.50)
GFR: 112.61 mL/min (ref >60.00)
Glucose, Bld: 140 mg/dL — ABNORMAL HIGH (ref 70–99)
Potassium: 3.9 mEq/L (ref 3.5–5.1)
Sodium: 136 mEq/L (ref 135–145)
Total Bilirubin: 0.4 mg/dL (ref 0.2–1.2)
Total Protein: 7.7 g/dL (ref 6.0–8.3)

## 2023-03-07 ENCOUNTER — Ambulatory Visit (INDEPENDENT_AMBULATORY_CARE_PROVIDER_SITE_OTHER): Payer: 59 | Admitting: Podiatry

## 2023-03-07 DIAGNOSIS — M2031 Hallux varus (acquired), right foot: Secondary | ICD-10-CM | POA: Diagnosis not present

## 2023-03-07 DIAGNOSIS — Z01818 Encounter for other preprocedural examination: Secondary | ICD-10-CM | POA: Diagnosis not present

## 2023-03-07 DIAGNOSIS — M2041 Other hammer toe(s) (acquired), right foot: Secondary | ICD-10-CM

## 2023-03-07 NOTE — Progress Notes (Signed)
Subjective:  Patient ID: Michael Montgomery, male    DOB: 06-20-1961,  MRN: [id number]  Chief Complaint  Patient presents with   Vascular abnormality    62 y.o. male presents with the above complaint.  Patient presents with right hallux and second through fifth hammertoe contracture with flexion contracture.  Patient states is painful to touch as well as got worse he had ABIs done.  He wanted to discuss surgical options.  He is   Review of Systems: Negative except as noted in the HPI. Denies N/V/F/Ch.  Past Medical History:  Diagnosis Date   Cataract march, april   bilateral removed    Chickenpox    Diabetes mellitus without complication (HCC)    metformin    Diabetic amyotrophy (HCC)    sees dr at [facility]   Gastric ulcer    Heart murmur    When he was younger, no recent issues   High blood pressure    controlled with meds   Hypercholesteremia    controlled with medication   Knee injury    left   Motion sickness    boats   Neuropathy     Current Outpatient Medications:    ACCU-CHEK GUIDE test strip, USE ONCE DAILY FASTING. DX CODE E11.9, Disp: 100 strip, Rfl: 12   Accu-Chek Softclix Lancets lancets, 1 each by Other route daily. Check sugar fasting daily. Dx Code E11.9., Disp: 100 each, Rfl: 3   amLODipine (NORVASC) 10 MG tablet, Take 1 tablet (10 mg total) by mouth daily., Disp: 100 tablet, Rfl: 2   aspirin EC 81 MG tablet, Take 81 mg by mouth daily. Swallow whole., Disp: , Rfl:    atorvastatin (LIPITOR) 40 MG tablet, TAKE 1 TABLET BY MOUTH EVERY DAY, Disp: 90 tablet, Rfl: 1   Blood Glucose Monitoring Suppl (ACCU-CHEK GUIDE ME) w/Device KIT, USE ONCE DAILY FASTING. DX CODE E11.9., Disp: 1 kit, Rfl: 0   buPROPion (WELLBUTRIN XL) 300 MG 24 hr tablet, Take by mouth., Disp: , Rfl:    fluticasone (FLONASE) 50 MCG/ACT nasal spray, SPRAY 2 SPRAYS INTO EACH NOSTRIL EVERY DAY, Disp: 48 mL, Rfl: 1   gabapentin (NEURONTIN) 100 MG capsule, Take 200 mg by mouth 3 (three) times daily.,  Disp: , Rfl:    hydrochlorothiazide (MICROZIDE) 12.5 MG capsule, TAKE 1 CAPSULE (12.5 MG TOTAL) BY MOUTH DAILY. MUST KEEP APPT FOR FURTHER REFILLS, Disp: 90 capsule, Rfl: 1   Multiple Vitamin (MULTIVITAMIN) capsule, Take 1 capsule by mouth daily., Disp: , Rfl:    Nutritional Supplements (ENSURE ACTIVE HIGH PROTEIN) LIQD, Take 1 Can by mouth 3 (three) times daily as needed., Disp: 21330 mL, Rfl: 11   Omega-3 Fatty Acids (FISH OIL PO), Take 1 capsule by mouth daily. , Disp: , Rfl:    OZEMPIC, 0.25 OR 0.5 MG/DOSE, 2 MG/3ML SOPN, INJECT 0.25 MG SUBCUTANEOUSLY  WEEKLY FOR 4 WEEKS, THEN 0.5 MG  WEEKLY THEREAFTER, Disp: 9 mL, Rfl: 3   sertraline (ZOLOFT) 100 MG tablet, TAKE 1 TABLET BY MOUTH EVERY DAY, Disp: 90 tablet, Rfl: 2   sodium chloride 0.9 % infusion, Inject into the vein., Disp: , Rfl:   Social History   Tobacco Use  Smoking Status Former   Types: Cigars  Smokeless Tobacco Never  Tobacco Comments   one cigar once a month to once every 2 months     No Known Allergies Objective:  There were no vitals filed for this visit. There is no height or weight on file to calculate BMI.  Constitutional Well developed. Well nourished.  Vascular Dorsalis pedis pulses palpable bilaterally. Posterior tibial pulses palpable bilaterally. Capillary refill normal to all digits.  No cyanosis or clubbing noted. Pedal hair growth normal.  Neurologic Normal speech. Oriented to person, place, and time. Epicritic sensation to light touch grossly present bilaterally.  Dermatologic Nails well groomed and normal in appearance. No open wounds. No skin lesions.  Orthopedic: Hammertoe contracture noted of 1 through 5 with hallux malleus semirigid.  2 through 5 semirigid hammertoe contractures noted.  Pain on palpation to the contractures.   Radiographs: None Assessment:   1. Hammertoe of right foot   2. Hallux hammertoe, right    Plan:  Patient was evaluated and treated and all questions  answered.  Hallux malleus with IPJ contracture and hammertoe contractures 2 through 5 right foot with each respective -All questions and concerns were discussed with the patient in extensive detail -Given that patient has failed conservative care with shoe gear modification padding protecting.  Patient will benefit from surgical correction Sev reconstruction of digits 1 through 5 with hallux IPJ fusion with hammertoe arthroplasty of 2 through 5 right foot with fixation I discussed my preoperative intra postop plan with the patient in extensive detail.  He states understand like to proceed with surgery -Informed surgical risk consent was reviewed and read aloud to the patient.  I reviewed the films.  I have discussed my findings with the patient in great detail.  I have discussed all risks including but not limited to infection, stiffness, scarring, limp, disability, deformity, damage to blood vessels and nerves, numbness, poor healing, need for braces, arthritis, chronic pain, amputation, death.  All benefits and realistic expectations discussed in great detail.  I have made no promises as to the outcome.  I have provided realistic expectations.  I have offered the patient a 2nd opinion, which they have declined and assured me they preferred to proceed despite the risks   No follow-ups on file.

## 2023-03-09 ENCOUNTER — Other Ambulatory Visit: Payer: Self-pay | Admitting: Family Medicine

## 2023-03-13 ENCOUNTER — Telehealth: Payer: Self-pay | Admitting: Family Medicine

## 2023-03-13 NOTE — Telephone Encounter (Signed)
This is for Dr. Birdie Sons so please leave it in his box until 03/14/23.  [facility] has stopped the Patient's incontinence supplies due to the paperwork not having a diagnosis  and diagnosis code of some kind of incontinence. [facility] states they need you to write up a clinical letter with this information and fax to them.

## 2023-03-13 NOTE — Telephone Encounter (Signed)
Pt would like to be called regarding his incontinence items to [facility]

## 2023-03-15 NOTE — Telephone Encounter (Signed)
Letter printed.

## 2023-03-16 DIAGNOSIS — Z9181 History of falling: Secondary | ICD-10-CM | POA: Diagnosis not present

## 2023-03-16 DIAGNOSIS — G6181 Chronic inflammatory demyelinating polyneuritis: Secondary | ICD-10-CM | POA: Diagnosis not present

## 2023-03-16 DIAGNOSIS — Z8616 Personal history of COVID-19: Secondary | ICD-10-CM | POA: Diagnosis not present

## 2023-03-16 DIAGNOSIS — M48061 Spinal stenosis, lumbar region without neurogenic claudication: Secondary | ICD-10-CM | POA: Diagnosis not present

## 2023-03-17 NOTE — Telephone Encounter (Signed)
Faxed printed letter to [facility] on 03/16/23 and received okay confirmation

## 2023-03-24 ENCOUNTER — Ambulatory Visit (INDEPENDENT_AMBULATORY_CARE_PROVIDER_SITE_OTHER): Payer: 59

## 2023-03-24 ENCOUNTER — Telehealth: Payer: Self-pay | Admitting: *Deleted

## 2023-03-24 DIAGNOSIS — R195 Other fecal abnormalities: Secondary | ICD-10-CM

## 2023-03-24 DIAGNOSIS — Z1211 Encounter for screening for malignant neoplasm of colon: Secondary | ICD-10-CM | POA: Diagnosis not present

## 2023-03-24 LAB — FECAL OCCULT BLOOD, IMMUNOCHEMICAL: Fecal Occult Bld: POSITIVE — AB

## 2023-03-24 NOTE — Telephone Encounter (Signed)
Spoke to Patient about positive IFOB test and Patient states he has an appointment with Wyline Mood on 05/16/23 so I told him to keep that appointment.

## 2023-03-24 NOTE — Telephone Encounter (Signed)
Noted. I will forward this to Dr Tobi Bastos so he is aware.   Hi Dr Tobi Bastos, this patient is seeing you in July for elevated LFTS, though his IFOB returned positive during colon cancer screening. I wanted to make you aware of this for when you see him. Please let me know if you have any questions.

## 2023-03-24 NOTE — Telephone Encounter (Signed)
Please let the patient know that his stool test for blood was positive. He will need to see GI to determine the next step in management of this. I can place the referral once you speak with him.

## 2023-03-24 NOTE — Telephone Encounter (Signed)
CRITICAL VALUE STICKER  CRITICAL VALUE: +IFOB  RECEIVER (on-site recipient of call): Silvestre Moment, CMA  DATE & TIME NOTIFIED: 03/24/23 @ 12:15pm  MESSENGER (representative from lab): Hope  MD NOTIFIED: Dr. Birdie Sons  TIME OF NOTIFICATION: 12:18pm  RESPONSE:

## 2023-03-27 ENCOUNTER — Encounter: Payer: Self-pay | Admitting: Gastroenterology

## 2023-03-27 NOTE — Telephone Encounter (Signed)
Kandis Cocking - can we make sure fobt positive added to visit on the schedule   Thanks

## 2023-03-27 NOTE — Telephone Encounter (Signed)
Noted  

## 2023-03-28 DIAGNOSIS — G6181 Chronic inflammatory demyelinating polyneuritis: Secondary | ICD-10-CM | POA: Diagnosis not present

## 2023-03-29 DIAGNOSIS — G6181 Chronic inflammatory demyelinating polyneuritis: Secondary | ICD-10-CM | POA: Diagnosis not present

## 2023-03-30 DIAGNOSIS — G6181 Chronic inflammatory demyelinating polyneuritis: Secondary | ICD-10-CM | POA: Diagnosis not present

## 2023-03-31 ENCOUNTER — Ambulatory Visit: Payer: Medicare Other | Admitting: Urology

## 2023-03-31 DIAGNOSIS — G6181 Chronic inflammatory demyelinating polyneuritis: Secondary | ICD-10-CM | POA: Diagnosis not present

## 2023-04-07 ENCOUNTER — Telehealth: Payer: Self-pay | Admitting: Urology

## 2023-04-07 NOTE — Telephone Encounter (Signed)
DOS - 05/08/23  HALLUX IPJ FUSION 1ST RIGHT --- 16109 HAMMERTOE REPAIR 2-5 RIGHT --- [address]454  [facility] EFFECTIVE DATE - 10/24/22  DEDUCTIBLE - $240.00 W/ $0.00 REMAINING OOP - $8,850.00 W/ $8,550.87 REMAINING COINSURANCE - 20%  PER [employer] WEBSITE FOR CPT CODES 09811 AND [phone] Notification or Prior Authorization is not required for the requested services You are not required to submit a notification/prior authorization based on the information provided. If you have general questions about the prior authorization requirements, visit [email or web address] > Clinician Resources > Advance and Admission Notification Requirements. The number above acknowledges your notification. Please write this reference number down for future reference. If you would like to request an organization determination, please call us at [phone].   Decision ID #: [id number]

## 2023-04-17 ENCOUNTER — Other Ambulatory Visit: Payer: Self-pay | Admitting: Family Medicine

## 2023-04-17 DIAGNOSIS — E785 Hyperlipidemia, unspecified: Secondary | ICD-10-CM

## 2023-04-20 ENCOUNTER — Ambulatory Visit: Payer: Medicare Other | Admitting: Urology

## 2023-05-01 ENCOUNTER — Telehealth: Payer: Self-pay | Admitting: Family Medicine

## 2023-05-01 NOTE — Telephone Encounter (Signed)
Patient wanted to know if Dr Birdie Sons would send something into his pharmacy for constipation or recommend something .

## 2023-05-02 NOTE — Telephone Encounter (Signed)
I need more details on his constipation. How often is he having BMs? How many per day? Does he have to strain? Any blood in his stool? Abdominal pain? Other symptoms?

## 2023-05-05 NOTE — Telephone Encounter (Signed)
Spoke to Patient he has a bowel movement every 3 days. Patient states his poop is hard very hard. Patient states he has stomach pain sometimes but usually his stomach just growls. Patient has had these symptoms x 1 month. Patient has taken Mylox with little help.

## 2023-05-05 NOTE — Telephone Encounter (Signed)
Noted. Patient can try miralax one capful daily over the counter to see if that helps. If it is not helpful then he should let us know.

## 2023-05-05 NOTE — Telephone Encounter (Signed)
Spoke to Patient regarding Dr. Purvis Sheffield recommendations and patient is agreeable.

## 2023-05-08 ENCOUNTER — Other Ambulatory Visit: Payer: Self-pay | Admitting: Podiatry

## 2023-05-08 DIAGNOSIS — M2041 Other hammer toe(s) (acquired), right foot: Secondary | ICD-10-CM | POA: Diagnosis not present

## 2023-05-08 DIAGNOSIS — M2031 Hallux varus (acquired), right foot: Secondary | ICD-10-CM | POA: Diagnosis not present

## 2023-05-08 DIAGNOSIS — M19071 Primary osteoarthritis, right ankle and foot: Secondary | ICD-10-CM | POA: Diagnosis not present

## 2023-05-08 DIAGNOSIS — M2011 Hallux valgus (acquired), right foot: Secondary | ICD-10-CM | POA: Diagnosis not present

## 2023-05-08 MED ORDER — OXYCODONE-ACETAMINOPHEN 5-325 MG PO TABS: 1.0000 | ORAL_TABLET | ORAL | 0 refills | Status: DC | PRN

## 2023-05-08 MED ORDER — IBUPROFEN 800 MG PO TABS: 800.0000 mg | ORAL_TABLET | Freq: Four times a day (QID) | ORAL | 1 refills | Status: DC | PRN

## 2023-05-09 ENCOUNTER — Other Ambulatory Visit: Payer: Self-pay | Admitting: Family Medicine

## 2023-05-11 ENCOUNTER — Other Ambulatory Visit: Payer: Self-pay | Admitting: Family

## 2023-05-16 ENCOUNTER — Ambulatory Visit: Payer: 59 | Admitting: Gastroenterology

## 2023-05-16 ENCOUNTER — Other Ambulatory Visit: Payer: Self-pay

## 2023-05-18 ENCOUNTER — Ambulatory Visit: Payer: 59 | Admitting: Podiatry

## 2023-05-18 ENCOUNTER — Ambulatory Visit (INDEPENDENT_AMBULATORY_CARE_PROVIDER_SITE_OTHER): Payer: 59 | Admitting: Podiatry

## 2023-05-18 ENCOUNTER — Ambulatory Visit (INDEPENDENT_AMBULATORY_CARE_PROVIDER_SITE_OTHER): Payer: 59

## 2023-05-18 DIAGNOSIS — Z9889 Other specified postprocedural states: Secondary | ICD-10-CM

## 2023-05-18 DIAGNOSIS — M2031 Hallux varus (acquired), right foot: Secondary | ICD-10-CM

## 2023-05-18 DIAGNOSIS — M2041 Other hammer toe(s) (acquired), right foot: Secondary | ICD-10-CM

## 2023-05-18 NOTE — Progress Notes (Signed)
Subjective:  Patient ID: Michael Montgomery, male    DOB: 1961-02-04,  MRN: [id number]  Chief Complaint  Patient presents with   Routine Post Op    RFC/POV #1 DOS 05/08/2023 RT HAMMERTOE REPAIR 2-5 WITH HALLUX IPJ FUSION,foot is doing good, not a lot of pain    DOS: 05/08/2023 Procedure: Right hallux IPJ fusion with hammertoe arthroplasty of the second  62 y.o. male returns for post-op check.  He states he is doing well.  Minimal pain.  He is wheelchair-bound.  He has bandages clean dry and intact.  Review of Systems: Negative except as noted in the HPI. Denies N/V/F/Ch.  Past Medical History:  Diagnosis Date   Cataract march, april   bilateral removed    Chickenpox    Diabetes mellitus without complication (HCC)    metformin    Diabetic amyotrophy (HCC)    sees dr at [facility]   Gastric ulcer    Heart murmur    When he was younger, no recent issues   High blood pressure    controlled with meds   Hypercholesteremia    controlled with medication   Knee injury    left   Motion sickness    boats   Neuropathy     Current Outpatient Medications:    ACCU-CHEK GUIDE test strip, USE ONCE DAILY FASTING. DX CODE E11.9, Disp: 100 strip, Rfl: 12   Accu-Chek Softclix Lancets lancets, 1 each by Other route daily. Check sugar fasting daily. Dx Code E11.9., Disp: 100 each, Rfl: 3   amLODipine (NORVASC) 10 MG tablet, Take 1 tablet (10 mg total) by mouth daily., Disp: 100 tablet, Rfl: 2   aspirin EC 81 MG tablet, Take 81 mg by mouth daily. Swallow whole., Disp: , Rfl:    atorvastatin (LIPITOR) 40 MG tablet, TAKE 1 TABLET BY MOUTH EVERY DAY, Disp: 90 tablet, Rfl: 1   Blood Glucose Monitoring Suppl (ACCU-CHEK GUIDE ME) w/Device KIT, USE ONCE DAILY FASTING. DX CODE E11.9., Disp: 1 kit, Rfl: 0   fluticasone (FLONASE) 50 MCG/ACT nasal spray, SPRAY 2 SPRAYS INTO EACH NOSTRIL EVERY DAY, Disp: 48 mL, Rfl: 1   gabapentin (NEURONTIN) 100 MG capsule, Take 200 mg by mouth 3 (three) times daily., Disp: ,  Rfl:    hydrochlorothiazide (MICROZIDE) 12.5 MG capsule, TAKE 1 CAPSULE (12.5 MG TOTAL) BY MOUTH DAILY. MUST KEEP APPT FOR FURTHER REFILLS, Disp: 90 capsule, Rfl: 1   ibuprofen (ADVIL) 800 MG tablet, Take 1 tablet (800 mg total) by mouth every 6 (six) hours as needed., Disp: 60 tablet, Rfl: 1   Multiple Vitamin (MULTIVITAMIN) capsule, Take 1 capsule by mouth daily., Disp: , Rfl:    Nutritional Supplements (ENSURE ACTIVE HIGH PROTEIN) LIQD, Take 1 Can by mouth 3 (three) times daily as needed., Disp: 21330 mL, Rfl: 11   Omega-3 Fatty Acids (FISH OIL PO), Take 1 capsule by mouth daily. , Disp: , Rfl:    oxyCODONE-acetaminophen (PERCOCET) 5-325 MG tablet, Take 1 tablet by mouth every 4 (four) hours as needed for severe pain., Disp: 30 tablet, Rfl: 0   OZEMPIC, 0.25 OR 0.5 MG/DOSE, 2 MG/3ML SOPN, INJECT 0.25 MG SUBCUTANEOUSLY  WEEKLY FOR 4 WEEKS, THEN 0.5 MG  WEEKLY THEREAFTER, Disp: 9 mL, Rfl: 3   sertraline (ZOLOFT) 100 MG tablet, TAKE 1 TABLET BY MOUTH EVERY DAY, Disp: 90 tablet, Rfl: 2   sodium chloride 0.9 % infusion, Inject into the vein., Disp: , Rfl:    buPROPion (WELLBUTRIN XL) 300 MG 24 hr tablet,  Take by mouth., Disp: , Rfl:   Social History   Tobacco Use  Smoking Status Former   Types: Cigars  Smokeless Tobacco Never  Tobacco Comments   one cigar once a month to once every 2 months     No Known Allergies Objective:  There were no vitals filed for this visit. There is no height or weight on file to calculate BMI. Constitutional Well developed. Well nourished.  Vascular Foot warm and well perfused. Capillary refill normal to all digits.   Neurologic Normal speech. Oriented to person, place, and time. Epicritic sensation to light touch grossly present bilaterally.  Dermatologic Skin healing well without signs of infection. Skin edges well coapted without signs of infection.  Orthopedic: Tenderness to palpation noted about the surgical site.   Radiographs: 3 views of skeletally  mature the right foot: Good correction alignment noted.  Hardware is intact no signs of backing or loosening noted.  Reduction of deformity noted. Assessment:   1. Hammertoe of right foot   2. Hallux hammertoe, right   3. Status post foot surgery    Plan:  Patient was evaluated and treated and all questions answered.  S/p foot surgery right -Progressing as expected post-operatively. -XR: See above -WB Status: W nonweightbearing to the right lower extremity -Sutures: Intact.  Clinical signs of Deis is noted.  No complication noted. -Medications: None -Foot redressed.  No follow-ups on file.

## 2023-05-23 DIAGNOSIS — G6181 Chronic inflammatory demyelinating polyneuritis: Secondary | ICD-10-CM | POA: Diagnosis not present

## 2023-05-24 DIAGNOSIS — G6181 Chronic inflammatory demyelinating polyneuritis: Secondary | ICD-10-CM | POA: Diagnosis not present

## 2023-05-25 DIAGNOSIS — G6181 Chronic inflammatory demyelinating polyneuritis: Secondary | ICD-10-CM | POA: Diagnosis not present

## 2023-05-25 HISTORY — PX: FOOT SURGERY: SHX648

## 2023-05-26 DIAGNOSIS — G6181 Chronic inflammatory demyelinating polyneuritis: Secondary | ICD-10-CM | POA: Diagnosis not present

## 2023-06-01 ENCOUNTER — Encounter: Payer: 59 | Admitting: Podiatry

## 2023-06-06 ENCOUNTER — Ambulatory Visit (INDEPENDENT_AMBULATORY_CARE_PROVIDER_SITE_OTHER): Payer: 59 | Admitting: Podiatry

## 2023-06-06 DIAGNOSIS — M2031 Hallux varus (acquired), right foot: Secondary | ICD-10-CM

## 2023-06-06 DIAGNOSIS — Z9889 Other specified postprocedural states: Secondary | ICD-10-CM

## 2023-06-06 DIAGNOSIS — M2041 Other hammer toe(s) (acquired), right foot: Secondary | ICD-10-CM

## 2023-06-06 NOTE — Progress Notes (Signed)
Subjective:  Patient ID: Michael Montgomery, male    DOB: 11/19/1960,  MRN: [id number]  Chief Complaint  Patient presents with   Myrtie Neither    DOS: 05/08/2023 Procedure: Right hallux IPJ fusion with hammertoe arthroplasty of the second  62 y.o. male returns for post-op check.  He states he is doing well.  Minimal pain.  He is wheelchair-bound.  He has bandages clean dry and intact.  Review of Systems: Negative except as noted in the HPI. Denies N/V/F/Ch.  Past Medical History:  Diagnosis Date   Cataract march, april   bilateral removed    Chickenpox    Diabetes mellitus without complication (HCC)    metformin    Diabetic amyotrophy (HCC)    sees dr at [facility]   Gastric ulcer    Heart murmur    When he was younger, no recent issues   High blood pressure    controlled with meds   Hypercholesteremia    controlled with medication   Knee injury    left   Motion sickness    boats   Neuropathy     Current Outpatient Medications:    ACCU-CHEK GUIDE test strip, USE ONCE DAILY FASTING. DX CODE E11.9, Disp: 100 strip, Rfl: 12   Accu-Chek Softclix Lancets lancets, 1 each by Other route daily. Check sugar fasting daily. Dx Code E11.9., Disp: 100 each, Rfl: 3   amLODipine (NORVASC) 10 MG tablet, Take 1 tablet (10 mg total) by mouth daily., Disp: 100 tablet, Rfl: 2   aspirin EC 81 MG tablet, Take 81 mg by mouth daily. Swallow whole., Disp: , Rfl:    atorvastatin (LIPITOR) 40 MG tablet, TAKE 1 TABLET BY MOUTH EVERY DAY, Disp: 90 tablet, Rfl: 1   Blood Glucose Monitoring Suppl (ACCU-CHEK GUIDE ME) w/Device KIT, USE ONCE DAILY FASTING. DX CODE E11.9., Disp: 1 kit, Rfl: 0   buPROPion (WELLBUTRIN XL) 300 MG 24 hr tablet, Take by mouth., Disp: , Rfl:    fluticasone (FLONASE) 50 MCG/ACT nasal spray, SPRAY 2 SPRAYS INTO EACH NOSTRIL EVERY DAY, Disp: 48 mL, Rfl: 1   gabapentin (NEURONTIN) 100 MG capsule, Take 200 mg by mouth 3 (three) times daily., Disp: , Rfl:    hydrochlorothiazide (MICROZIDE)  12.5 MG capsule, TAKE 1 CAPSULE (12.5 MG TOTAL) BY MOUTH DAILY. MUST KEEP APPT FOR FURTHER REFILLS, Disp: 90 capsule, Rfl: 1   ibuprofen (ADVIL) 800 MG tablet, Take 1 tablet (800 mg total) by mouth every 6 (six) hours as needed., Disp: 60 tablet, Rfl: 1   Multiple Vitamin (MULTIVITAMIN) capsule, Take 1 capsule by mouth daily., Disp: , Rfl:    Nutritional Supplements (ENSURE ACTIVE HIGH PROTEIN) LIQD, Take 1 Can by mouth 3 (three) times daily as needed., Disp: 21330 mL, Rfl: 11   Omega-3 Fatty Acids (FISH OIL PO), Take 1 capsule by mouth daily. , Disp: , Rfl:    oxyCODONE-acetaminophen (PERCOCET) 5-325 MG tablet, Take 1 tablet by mouth every 4 (four) hours as needed for severe pain., Disp: 30 tablet, Rfl: 0   OZEMPIC, 0.25 OR 0.5 MG/DOSE, 2 MG/3ML SOPN, INJECT 0.25 MG SUBCUTANEOUSLY  WEEKLY FOR 4 WEEKS, THEN 0.5 MG  WEEKLY THEREAFTER, Disp: 9 mL, Rfl: 3   sertraline (ZOLOFT) 100 MG tablet, TAKE 1 TABLET BY MOUTH EVERY DAY, Disp: 90 tablet, Rfl: 2   sodium chloride 0.9 % infusion, Inject into the vein., Disp: , Rfl:   Social History   Tobacco Use  Smoking Status Former   Types: Cigars  Smokeless  Tobacco Never  Tobacco Comments   one cigar once a month to once every 2 months     No Known Allergies Objective:  There were no vitals filed for this visit. There is no height or weight on file to calculate BMI. Constitutional Well developed. Well nourished.  Vascular Foot warm and well perfused. Capillary refill normal to all digits.   Neurologic Normal speech. Oriented to person, place, and time. Epicritic sensation to light touch grossly present bilaterally.  Dermatologic Skin completely reepithelialized.  No signs of Deis is noted no complication noted rectus alignment of the first and second toe noted  Orthopedic: Tenderness to palpation noted about the surgical site.   Radiographs: 3 views of skeletally mature the right foot: Good correction alignment noted.  Hardware is intact no signs  of backing or loosening noted.  Reduction of deformity noted. Assessment:   1. Hammertoe of right foot   2. Hallux hammertoe, right   3. Status post foot surgery     Plan:  Patient was evaluated and treated and all questions answered.  S/p foot surgery right -Progressing as expected post-operatively. -XR: See above -WB Status: Weight-bear -Sutures: Removed clinical signs of Deis is noted.  No complication noted. -Medications: None -Will plan on removing the pain in next 2 weeks.  No follow-ups on file.

## 2023-06-07 ENCOUNTER — Ambulatory Visit: Payer: Medicare Other | Admitting: Urology

## 2023-06-09 ENCOUNTER — Encounter: Payer: Self-pay | Admitting: Urology

## 2023-06-22 ENCOUNTER — Ambulatory Visit (INDEPENDENT_AMBULATORY_CARE_PROVIDER_SITE_OTHER): Payer: 59 | Admitting: Podiatry

## 2023-06-22 DIAGNOSIS — M2041 Other hammer toe(s) (acquired), right foot: Secondary | ICD-10-CM

## 2023-06-22 DIAGNOSIS — Z9889 Other specified postprocedural states: Secondary | ICD-10-CM

## 2023-06-22 DIAGNOSIS — M2031 Hallux varus (acquired), right foot: Secondary | ICD-10-CM

## 2023-06-22 NOTE — Progress Notes (Signed)
Subjective:  Patient ID: Michael Montgomery, male    DOB: 1961-01-03,  MRN: [id number]  Chief Complaint  Patient presents with   Myrtie Neither    DOS: 05/08/2023 Procedure: Right hallux IPJ fusion with hammertoe arthroplasty of the second  62 y.o. male returns for post-op check.  He states he is doing well.  Minimal pain.  He is wheelchair-bound.  He has bandages clean dry and intact.  Review of Systems: Negative except as noted in the HPI. Denies N/V/F/Ch.  Past Medical History:  Diagnosis Date   Cataract march, april   bilateral removed    Chickenpox    Diabetes mellitus without complication (HCC)    metformin    Diabetic amyotrophy (HCC)    sees dr at [facility]   Gastric ulcer    Heart murmur    When he was younger, no recent issues   High blood pressure    controlled with meds   Hypercholesteremia    controlled with medication   Knee injury    left   Motion sickness    boats   Neuropathy     Current Outpatient Medications:    ACCU-CHEK GUIDE test strip, USE ONCE DAILY FASTING. DX CODE E11.9, Disp: 100 strip, Rfl: 12   Accu-Chek Softclix Lancets lancets, 1 each by Other route daily. Check sugar fasting daily. Dx Code E11.9., Disp: 100 each, Rfl: 3   amLODipine (NORVASC) 10 MG tablet, Take 1 tablet (10 mg total) by mouth daily., Disp: 100 tablet, Rfl: 2   aspirin EC 81 MG tablet, Take 81 mg by mouth daily. Swallow whole., Disp: , Rfl:    atorvastatin (LIPITOR) 40 MG tablet, TAKE 1 TABLET BY MOUTH EVERY DAY, Disp: 90 tablet, Rfl: 1   Blood Glucose Monitoring Suppl (ACCU-CHEK GUIDE ME) w/Device KIT, USE ONCE DAILY FASTING. DX CODE E11.9., Disp: 1 kit, Rfl: 0   buPROPion (WELLBUTRIN XL) 300 MG 24 hr tablet, Take by mouth., Disp: , Rfl:    fluticasone (FLONASE) 50 MCG/ACT nasal spray, SPRAY 2 SPRAYS INTO EACH NOSTRIL EVERY DAY, Disp: 48 mL, Rfl: 1   gabapentin (NEURONTIN) 100 MG capsule, Take 200 mg by mouth 3 (three) times daily., Disp: , Rfl:    hydrochlorothiazide (MICROZIDE)  12.5 MG capsule, TAKE 1 CAPSULE (12.5 MG TOTAL) BY MOUTH DAILY. MUST KEEP APPT FOR FURTHER REFILLS, Disp: 90 capsule, Rfl: 1   ibuprofen (ADVIL) 800 MG tablet, Take 1 tablet (800 mg total) by mouth every 6 (six) hours as needed., Disp: 60 tablet, Rfl: 1   Multiple Vitamin (MULTIVITAMIN) capsule, Take 1 capsule by mouth daily., Disp: , Rfl:    Nutritional Supplements (ENSURE ACTIVE HIGH PROTEIN) LIQD, Take 1 Can by mouth 3 (three) times daily as needed., Disp: 21330 mL, Rfl: 11   Omega-3 Fatty Acids (FISH OIL PO), Take 1 capsule by mouth daily. , Disp: , Rfl:    oxyCODONE-acetaminophen (PERCOCET) 5-325 MG tablet, Take 1 tablet by mouth every 4 (four) hours as needed for severe pain., Disp: 30 tablet, Rfl: 0   OZEMPIC, 0.25 OR 0.5 MG/DOSE, 2 MG/3ML SOPN, INJECT 0.25 MG SUBCUTANEOUSLY  WEEKLY FOR 4 WEEKS, THEN 0.5 MG  WEEKLY THEREAFTER, Disp: 9 mL, Rfl: 3   sertraline (ZOLOFT) 100 MG tablet, TAKE 1 TABLET BY MOUTH EVERY DAY, Disp: 90 tablet, Rfl: 2   sodium chloride 0.9 % infusion, Inject into the vein., Disp: , Rfl:   Social History   Tobacco Use  Smoking Status Former   Types: Cigars  Smokeless  Tobacco Never  Tobacco Comments   one cigar once a month to once every 2 months     No Known Allergies Objective:  There were no vitals filed for this visit. There is no height or weight on file to calculate BMI. Constitutional Well developed. Well nourished.  Vascular Foot warm and well perfused. Capillary refill normal to all digits.   Neurologic Normal speech. Oriented to person, place, and time. Epicritic sensation to light touch grossly present bilaterally.  Dermatologic Skin completely reepithelialized.  No signs of Deis is noted no complication noted rectus alignment of the first and second toe noted  Orthopedic: No further tenderness to palpation noted about the surgical site.   Radiographs: 3 views of skeletally mature the right foot: Good correction alignment noted.  Hardware is  intact no signs of backing or loosening noted.  Reduction of deformity noted. Assessment:   No diagnosis found.   Plan:  Patient was evaluated and treated and all questions answered.  S/p foot surgery right -Pin removed without any complication.  Patient is officially discharged from my care if any foot and ankle issues on future he will come back and see me.  No follow-ups on file.

## 2023-07-11 ENCOUNTER — Ambulatory Visit: Payer: 59 | Admitting: Gastroenterology

## 2023-07-14 DIAGNOSIS — G6181 Chronic inflammatory demyelinating polyneuritis: Secondary | ICD-10-CM | POA: Diagnosis not present

## 2023-07-17 ENCOUNTER — Other Ambulatory Visit: Payer: Self-pay

## 2023-07-17 ENCOUNTER — Telehealth: Payer: Self-pay | Admitting: Family Medicine

## 2023-07-17 MED ORDER — OZEMPIC (0.25 OR 0.5 MG/DOSE) 2 MG/3ML ~~LOC~~ SOPN: 0.5000 mg | PEN_INJECTOR | SUBCUTANEOUS | 3 refills | Status: DC

## 2023-07-17 NOTE — Telephone Encounter (Signed)
Patient just called and needs a refill on his medication. The name is OZEMPIC, 0.25 OR 0.5 MG/DOSE, 2 MG/3ML SOPN. He says he is out and need this medication as soon as possible. The pharmacy he uses is [facility] [phone] [facility], [place] - [address] [place], [place] [address] Phone: [phone]  Fax: [phone] DEA #: [id number]  His number is [phone]

## 2023-07-17 NOTE — Telephone Encounter (Signed)
Prescription sent to Pharmacy.

## 2023-07-18 DIAGNOSIS — G6181 Chronic inflammatory demyelinating polyneuritis: Secondary | ICD-10-CM | POA: Diagnosis not present

## 2023-07-19 DIAGNOSIS — G6181 Chronic inflammatory demyelinating polyneuritis: Secondary | ICD-10-CM | POA: Diagnosis not present

## 2023-07-20 DIAGNOSIS — G6181 Chronic inflammatory demyelinating polyneuritis: Secondary | ICD-10-CM | POA: Diagnosis not present

## 2023-07-21 DIAGNOSIS — G6181 Chronic inflammatory demyelinating polyneuritis: Secondary | ICD-10-CM | POA: Diagnosis not present

## 2023-07-30 ENCOUNTER — Other Ambulatory Visit: Payer: Self-pay | Admitting: Family Medicine

## 2023-08-01 ENCOUNTER — Ambulatory Visit (INDEPENDENT_AMBULATORY_CARE_PROVIDER_SITE_OTHER): Payer: 59

## 2023-08-01 VITALS — BP 122/75 | HR 85 | Ht 71.0 in | Wt 158.0 lb

## 2023-08-01 DIAGNOSIS — Z0000 Encounter for general adult medical examination without abnormal findings: Secondary | ICD-10-CM | POA: Diagnosis not present

## 2023-08-01 NOTE — Progress Notes (Signed)
Subjective:   Michael Montgomery is a 62 y.o. male who presents for Medicare Annual/Subsequent preventive examination.  Visit Complete: Virtual I connected with  Winn Jock Manygoats on 08/01/23 by a audio enabled telemedicine application and verified that I am speaking with the correct person using two identifiers.  Patient Location: Home  Provider Location: Office/Clinic  I discussed the limitations of evaluation and management by telemedicine. The patient expressed understanding and agreed to proceed.  Vital Signs: Because this visit was a virtual/telehealth visit, some criteria may be missing or patient reported. Any vitals not documented were not able to be obtained and vitals that have been documented are patient reported. rmation answered by patient is correct and no changes since this date.  Cardiac Risk Factors include: advanced age (>57men, >6 women);diabetes mellitus;dyslipidemia;male gender;hypertension;Other (see comment), Risk factor comments: PAD     Objective:    Today's Vitals   08/01/23 0818  BP: 122/75  Pulse: 85  Weight: 158 lb (71.7 kg)  Height: 5\' 11"  (1.803 m)   Body mass index is 22.04 kg/m.     08/01/2023    8:41 AM 07/29/2022    1:42 PM 12/23/2021   10:10 PM 12/23/2021   12:50 PM 07/14/2021   11:43 AM 08/05/2020    2:02 PM 07/29/2020    4:41 PM  Advanced Directives  Does Patient Have a Medical Advance Directive? No No No No Yes No No  Type of Agricultural consultant;Living will    Does patient want to make changes to medical advance directive?     No - Patient declined    Copy of Healthcare Power of Attorney in Chart?     No - copy requested    Would patient like information on creating a medical advance directive? No - Patient declined No - Patient declined No - Patient declined   No - Patient declined No - Patient declined    Current Medications (verified) Outpatient Encounter Medications as of 08/01/2023  Medication Sig    ACCU-CHEK GUIDE test strip USE ONCE DAILY FASTING. DX CODE E11.9   Accu-Chek Softclix Lancets lancets 1 each by Other route daily. Check sugar fasting daily. Dx Code E11.9.   amLODipine (NORVASC) 10 MG tablet Take 1 tablet (10 mg total) by mouth daily.   aspirin EC 81 MG tablet Take 81 mg by mouth daily. Swallow whole.   atorvastatin (LIPITOR) 40 MG tablet TAKE 1 TABLET BY MOUTH EVERY DAY   Blood Glucose Monitoring Suppl (ACCU-CHEK GUIDE ME) w/Device KIT USE ONCE DAILY FASTING. DX CODE E11.9.   buPROPion (WELLBUTRIN XL) 300 MG 24 hr tablet Take by mouth.   fluticasone (FLONASE) 50 MCG/ACT nasal spray SPRAY 2 SPRAYS INTO EACH NOSTRIL EVERY DAY   hydrochlorothiazide (MICROZIDE) 12.5 MG capsule TAKE 1 CAPSULE (12.5 MG TOTAL) BY MOUTH DAILY. MUST KEEP APPT FOR FURTHER REFILLS   ibuprofen (ADVIL) 800 MG tablet Take 1 tablet (800 mg total) by mouth every 6 (six) hours as needed.   Multiple Vitamin (MULTIVITAMIN) capsule Take 1 capsule by mouth daily.   Omega-3 Fatty Acids (FISH OIL PO) Take 1 capsule by mouth daily.    Semaglutide,0.25 or 0.5MG /DOS, (OZEMPIC, 0.25 OR 0.5 MG/DOSE,) 2 MG/3ML SOPN Inject 0.5 mg into the skin once a week.   sertraline (ZOLOFT) 100 MG tablet TAKE 1 TABLET BY MOUTH EVERY DAY   sodium chloride 0.9 % infusion Inject into the vein.   gabapentin (NEURONTIN) 100 MG capsule Take  200 mg by mouth 3 (three) times daily. (Patient not taking: Reported on 08/01/2023)   Nutritional Supplements (ENSURE ACTIVE HIGH PROTEIN) LIQD Take 1 Can by mouth 3 (three) times daily as needed. (Patient not taking: Reported on 08/01/2023)   oxyCODONE-acetaminophen (PERCOCET) 5-325 MG tablet Take 1 tablet by mouth every 4 (four) hours as needed for severe pain. (Patient not taking: Reported on 08/01/2023)   No facility-administered encounter medications on file as of 08/01/2023.    Allergies (verified) Patient has no known allergies.   History: Past Medical History:  Diagnosis Date   Cataract  march, april   bilateral removed    Chickenpox    Diabetes mellitus without complication (HCC)    metformin    Diabetic amyotrophy (HCC)    sees dr at [facility]   Gastric ulcer    Heart murmur    When he was younger, no recent issues   High blood pressure    controlled with meds   Hypercholesteremia    controlled with medication   Knee injury    left   Motion sickness    boats   Neuropathy    Past Surgical History:  Procedure Laterality Date   CATARACT EXTRACTION W/PHACO Right 02/01/2016   Procedure: CATARACT EXTRACTION PHACO AND INTRAOCULAR LENS PLACEMENT (IOC) right;  Surgeon: Sherald Hess, MD;  Location[facility];  Service: Ophthalmology;  Laterality: Right;  DIABETIC - oral meds   CATARACT EXTRACTION W/PHACO Left 03/07/2016   Procedure: CATARACT EXTRACTION PHACO AND INTRAOCULAR LENS PLACEMENT (IOC);  Surgeon: Sherald Hess, MD;  Location: [facility];  Service: Ophthalmology;  Laterality: Left;  DIABETIC - oral meds    circumscision     FOOT SURGERY Right 05/2023   Family History  Problem Relation Age of Onset   Alcoholism Father    Hyperlipidemia Father    Hypertension Father    Throat cancer Father        smoked but had stopped 20 yeras before dx   Diabetes Mother    Emphysema Mother    Kidney disease Neg Hx    Prostate cancer Neg Hx    Colon cancer Neg Hx    Colon polyps Neg Hx    Esophageal cancer Neg Hx    Rectal cancer Neg Hx    Stomach cancer Neg Hx    Kidney cancer Neg Hx    Bladder Cancer Neg Hx    Social History   Socioeconomic History   Marital status: Married    Spouse name: Turrell Severt   Number of children: 2   Years of education: 12   Highest education level: 12th grade  Occupational History   Occupation: Disabled  Tobacco Use   Smoking status: Former    Types: Cigars   Smokeless tobacco: Never   Tobacco comments:    one cigar once a month to once every 2 months   Vaping Use   Vaping  status: Never Used  Substance and Sexual Activity   Alcohol use: Yes    Alcohol/week: 5.0 standard drinks of alcohol    Types: 5 Shots of liquor per week    Comment: pt stated " every once in a while"   Drug use: No   Sexual activity: Not Currently  Other Topics Concern   Not on file  Social History Narrative   Married   Social Determinants of Health   Financial Resource Strain: Low Risk  (08/01/2023)   Overall Financial Resource Strain (CARDIA)  Difficulty of Paying Living Expenses: Not hard at all  Food Insecurity: No Food Insecurity (08/01/2023)   Hunger Vital Sign    Worried About Running Out of Food in the Last Year: Never true    Ran Out of Food in the Last Year: Never true  Transportation Needs: No Transportation Needs (08/01/2023)   PRAPARE - Administrator, Civil Service (Medical): No    Lack of Transportation (Non-Medical): No  Physical Activity: Insufficiently Active (08/01/2023)   Exercise Vital Sign    Days of Exercise per Week: 2 days    Minutes of Exercise per Session: 20 min  Stress: No Stress Concern Present (08/01/2023)   [employer] of Occupational Health - Occupational Stress Questionnaire    Feeling of Stress : Only a little  Social Connections: Moderately Isolated (08/01/2023)   Social Connection and Isolation Panel [NHANES]    Frequency of Communication with Friends and Family: More than three times a week    Frequency of Social Gatherings with Friends and Family: More than three times a week    Attends Religious Services: Never    Database administrator or Organizations: No    Attends Engineer, structural: Never    Marital Status: Married    Tobacco Counseling Counseling given: Not Answered Tobacco comments: one cigar once a month to once every 2 months    Clinical Intake:  Pre-visit preparation completed: Yes  Pain : No/denies pain     BMI - recorded: 22.04 Nutritional Status: BMI of 19-24  Normal Nutritional  Risks: None Diabetes: Yes CBG done?: Yes (FBS 115) CBG resulted in Enter/ Edit results?: No Did pt. bring in CBG monitor from home?: No  How often do you need to have someone help you when you read instructions, pamphlets, or other written materials from your doctor or pharmacy?: 1 - Never  Interpreter Needed?: No  Information entered by :: R. Sieara Bremer LPN   Activities of Daily Living    08/01/2023    8:22 AM  In your present state of health, do you have any difficulty performing the following activities:  Hearing? 1  Comment some difficulty at times  Vision? 0  Comment readers  Difficulty concentrating or making decisions? 1  Walking or climbing stairs? 1  Dressing or bathing? 1  Doing errands, shopping? 1  Comment nurse and Horticulturist, commercial and eating ? Y  Using the Toilet? Y  In the past six months, have you accidently leaked urine? Y  Do you have problems with loss of bowel control? Y  Managing your Medications? N  Managing your Finances? Y  Housekeeping or managing your Housekeeping? Y    Patient Care Team: Glori Luis, MD as PCP - General (Family Medicine)  Indicate any recent Medical Services you may have received from other than [facility] providers in the past year (date may be approximate).     Assessment:   This is a routine wellness examination for Let[place].  Hearing/Vision screen Hearing Screening - Comments:: Some difficulty at times Vision Screening - Comments:: readers   Goals Addressed             This Visit's Progress    Patient Stated       Wants to exercise more and try to see PT again       Depression Screen    08/01/2023    8:34 AM 02/27/2023    3:02 PM 07/29/2022    1:40 PM 05/25/2022  2:29 PM 07/14/2021   11:24 AM 04/20/2021    1:45 PM 09/11/2020    8:09 AM  PHQ 2/9 Scores  PHQ - 2 Score 1 0 0 0 0 0 0  PHQ- 9 Score 3 0     10    Fall Risk    08/01/2023    8:26 AM 02/27/2023    3:02 PM 07/29/2022    1:42 PM  05/25/2022    2:29 PM 07/14/2021   11:45 AM  Fall Risk   Falls in the past year? 0 0 0 0 0  Number falls in past yr: 0 0 0 0 0  Injury with Fall? 0 0  0 0  Risk for fall due to : No Fall Risks No Fall Risks Impaired balance/gait;Impaired mobility No Fall Risks   Risk for fall due to: Comment   Wheelchair in use    Follow up Falls prevention discussed;Falls evaluation completed Falls evaluation completed Falls evaluation completed Falls evaluation completed Falls evaluation completed    MEDICARE RISK AT HOME: Medicare Risk at Home Any stairs in or around the home?: No If so, are there any without handrails?: No Home free of loose throw rugs in walkways, pet beds, electrical cords, etc?: Yes Adequate lighting in your home to reduce risk of falls?: Yes Life alert?: Yes Use of a cane, walker or w/c?: Yes (wheelchair) Grab bars in the bathroom?: No Shower chair or bench in shower?: Yes Elevated toilet seat or a handicapped toilet?: Yes   Cognitive Function:        08/01/2023    8:42 AM 07/29/2022    1:54 PM 07/11/2019   11:24 AM  6CIT Screen  What Year? 0 points 0 points 0 points  What month? 0 points 0 points 0 points  What time? 0 points 0 points 0 points  Count back from 20 0 points 0 points 0 points  Months in reverse 0 points 0 points 0 points  Repeat phrase 0 points 0 points 0 points  Total Score 0 points 0 points 0 points    Immunizations Immunization History  Administered Date(s) Administered   Influenza,inj,Quad PF,6+ Mos 06/17/2017, 06/21/2018, 06/09/2019   Influenza-Unspecified 08/24/2016, 06/21/2018, 07/12/2018   Pneumococcal Polysaccharide-23 12/08/2016   Tdap 12/08/2016, 06/09/2019    TDAP status: Up to date  Flu Vaccine status: Due, Education has been provided regarding the importance of this vaccine. Advised may receive this vaccine at local pharmacy or Health Dept. Aware to provide a copy of the vaccination record if obtained from local pharmacy or Health  Dept. Verbalized acceptance and understanding.   Covid-19 vaccine status: Information provided on how to obtain vaccines.   Qualifies for Shingles Vaccine? Yes   Zostavax completed No   Shingrix Completed?: No.    Education has been provided regarding the importance of this vaccine. Patient has been advised to call insurance company to determine out of pocket expense if they have not yet received this vaccine. Advised may also receive vaccine at local pharmacy or Health Dept. Verbalized acceptance and understanding.  Screening Tests Health Maintenance  Topic Date Due   Zoster Vaccines- Shingrix (1 of 2) Never done   Colonoscopy  03/31/2021   FOOT EXAM  03/08/2023   OPHTHALMOLOGY EXAM  03/23/2023   INFLUENZA VACCINE  05/25/2023   Diabetic kidney evaluation - Urine ACR  05/26/2023   HEMOGLOBIN A1C  06/06/2023   Medicare Annual Wellness (AWV)  07/30/2023   Diabetic kidney evaluation - eGFR measurement  02/27/2024  COLON CANCER SCREENING ANNUAL FOBT  03/23/2024   DTaP/Tdap/Td (3 - Td or Tdap) 06/08/2029   Hepatitis C Screening  Completed   HIV Screening  Completed   HPV VACCINES  Aged Out   COVID-19 Vaccine  Discontinued    Health Maintenance  Health Maintenance Due  Topic Date Due   Zoster Vaccines- Shingrix (1 of 2) Never done   Colonoscopy  03/31/2021   FOOT EXAM  03/08/2023   OPHTHALMOLOGY EXAM  03/23/2023   INFLUENZA VACCINE  05/25/2023   Diabetic kidney evaluation - Urine ACR  05/26/2023   HEMOGLOBIN A1C  06/06/2023   Medicare Annual Wellness (AWV)  07/30/2023    Colorectal cancer screening: Referral to GI placed by PCP. Pt aware the office will call re: appt.Patient has an appointment 08/15/23 with GI  Lung Cancer Screening: (Low Dose CT Chest recommended if Age 40-80 years, 20 pack-year currently smoking OR have quit w/in 15years.) does not qualify.     Additional Screening:  Hepatitis C Screening: does qualify; Completed 02/2021  Vision Screening: Recommended  annual ophthalmology exams for early detection of glaucoma and other disorders of the eye. Is the patient up to date with their annual eye exam?  No  Who is the provider or what is the name of the office in which the patient attends annual eye exams? [facility] Patient will call and schedule an appointment If pt is not established with a provider, would they like to be referred to a provider to establish care? No .   Dental Screening: Recommended annual dental exams for proper oral hygiene  Diabetic Foot Exam: Diabetic Foot Exam: Overdue, Pt has been advised about the importance in completing this exam. Pt is scheduled for diabetic foot exam on Needs completed and documented at next visit.  Community Resource Referral / Chronic Care Management: CRR required this visit?  No   CCM required this visit?  No     Plan:     I have personally reviewed and noted the following in the patient's chart:   Medical and social history Use of alcohol, tobacco or illicit drugs  Current medications and supplements including opioid prescriptions. Patient is not currently taking opioid prescriptions. Functional ability and status Nutritional status Physical activity Advanced directives List of other physicians Hospitalizations, surgeries, and ER visits in previous 12 months Vitals Screenings to include cognitive, depression, and falls Referrals and appointments  In addition, I have reviewed and discussed with patient certain preventive protocols, quality metrics, and best practice recommendations. A written personalized care plan for preventive services as well as general preventive health recommendations were provided to patient.     Sydell Axon, LPN   16/10/958   After Visit Summary: (MyChart) Due to this being a telephonic visit, the after visit summary with patients personalized plan was offered to patient via MyChart   Nurse Notes: None

## 2023-08-01 NOTE — Patient Instructions (Signed)
Mr. Michael Montgomery , Thank you for taking time to come for your Medicare Wellness Visit. I appreciate your ongoing commitment to your health goals. Please review the following plan we discussed and let me know if I can assist you in the future.   Referrals/Orders/Follow-Ups/Clinician Recommendations: Remember to update you vaccines.  This is a list of the screening recommended for you and due dates:  Health Maintenance  Topic Date Due   Zoster (Shingles) Vaccine (1 of 2) Never done   Colon Cancer Screening  03/31/2021   Complete foot exam   03/08/2023   Eye exam for diabetics  03/23/2023   Flu Shot  05/25/2023   Yearly kidney health urinalysis for diabetes  05/26/2023   Hemoglobin A1C  06/06/2023   Yearly kidney function blood test for diabetes  02/27/2024   Stool Blood Test  03/23/2024   Medicare Annual Wellness Visit  07/31/2024   DTaP/Tdap/Td vaccine (3 - Td or Tdap) 06/08/2029   Hepatitis C Screening  Completed   HIV Screening  Completed   HPV Vaccine  Aged Out   COVID-19 Vaccine  Discontinued    Advanced directives: (Declined) Advance directive discussed with you today. Even though you declined this today, please call our office should you change your mind, and we can give you the proper paperwork for you to fill out.  Next Medicare Annual Wellness Visit scheduled for next year: Yes 08/06/24 @ 8:15

## 2023-08-15 ENCOUNTER — Ambulatory Visit: Payer: 59 | Admitting: Gastroenterology

## 2023-09-11 DIAGNOSIS — G6181 Chronic inflammatory demyelinating polyneuritis: Secondary | ICD-10-CM | POA: Diagnosis not present

## 2023-09-12 DIAGNOSIS — G6181 Chronic inflammatory demyelinating polyneuritis: Secondary | ICD-10-CM | POA: Diagnosis not present

## 2023-09-13 DIAGNOSIS — G6181 Chronic inflammatory demyelinating polyneuritis: Secondary | ICD-10-CM | POA: Diagnosis not present

## 2023-09-14 DIAGNOSIS — G6181 Chronic inflammatory demyelinating polyneuritis: Secondary | ICD-10-CM | POA: Diagnosis not present

## 2023-10-03 ENCOUNTER — Ambulatory Visit: Payer: 59 | Admitting: Gastroenterology

## 2023-10-05 ENCOUNTER — Ambulatory Visit: Payer: 59 | Admitting: Podiatry

## 2023-10-11 ENCOUNTER — Other Ambulatory Visit: Payer: Self-pay | Admitting: Family Medicine

## 2023-10-24 ENCOUNTER — Other Ambulatory Visit: Payer: Self-pay | Admitting: Family Medicine

## 2023-10-24 DIAGNOSIS — E785 Hyperlipidemia, unspecified: Secondary | ICD-10-CM

## 2023-11-07 ENCOUNTER — Ambulatory Visit: Payer: 59 | Admitting: Podiatry

## 2023-11-13 DIAGNOSIS — G6181 Chronic inflammatory demyelinating polyneuritis: Secondary | ICD-10-CM | POA: Diagnosis not present

## 2023-11-14 DIAGNOSIS — G6181 Chronic inflammatory demyelinating polyneuritis: Secondary | ICD-10-CM | POA: Diagnosis not present

## 2023-11-16 ENCOUNTER — Ambulatory Visit: Payer: 59 | Admitting: Podiatry

## 2023-11-17 DIAGNOSIS — G6181 Chronic inflammatory demyelinating polyneuritis: Secondary | ICD-10-CM | POA: Diagnosis not present

## 2023-11-18 DIAGNOSIS — G6181 Chronic inflammatory demyelinating polyneuritis: Secondary | ICD-10-CM | POA: Diagnosis not present

## 2023-11-21 ENCOUNTER — Ambulatory Visit: Payer: 59 | Admitting: Gastroenterology

## 2023-11-23 ENCOUNTER — Ambulatory Visit: Payer: 59 | Admitting: Podiatry

## 2023-11-24 ENCOUNTER — Other Ambulatory Visit: Payer: Self-pay | Admitting: Family Medicine

## 2023-11-28 ENCOUNTER — Ambulatory Visit: Payer: 59 | Admitting: Podiatry

## 2023-12-05 ENCOUNTER — Ambulatory Visit (INDEPENDENT_AMBULATORY_CARE_PROVIDER_SITE_OTHER): Payer: 59 | Admitting: Podiatry

## 2023-12-05 ENCOUNTER — Ambulatory Visit (INDEPENDENT_AMBULATORY_CARE_PROVIDER_SITE_OTHER): Payer: 59

## 2023-12-05 ENCOUNTER — Encounter: Payer: Self-pay | Admitting: Podiatry

## 2023-12-05 VITALS — Ht 71.0 in | Wt 158.0 lb

## 2023-12-05 DIAGNOSIS — M2042 Other hammer toe(s) (acquired), left foot: Secondary | ICD-10-CM

## 2023-12-05 DIAGNOSIS — M205X2 Other deformities of toe(s) (acquired), left foot: Secondary | ICD-10-CM

## 2023-12-05 DIAGNOSIS — Z01818 Encounter for other preprocedural examination: Secondary | ICD-10-CM | POA: Diagnosis not present

## 2023-12-05 DIAGNOSIS — M2041 Other hammer toe(s) (acquired), right foot: Secondary | ICD-10-CM

## 2023-12-05 DIAGNOSIS — M2032 Hallux varus (acquired), left foot: Secondary | ICD-10-CM

## 2023-12-05 NOTE — Progress Notes (Signed)
Subjective:  Patient ID: Michael Montgomery, male    DOB: 1960/11/28,  MRN: [id number]  Chief Complaint  Patient presents with   Nail Problem    Pt is here for [facility] and to discuss surgery options for hammertoe on the left foot.    63 y.o. male presents with the above complaint.  Patient presents with left hallux and second digit hammertoe contracture noted and left hallux malleus noted.  He would like to discuss surgical options to the left side as well.  His right side is doing good no acute issues painful to touch she has failed all conservative care would like to discuss surgical options for the left foot  Review of Systems: Negative except as noted in the HPI. Denies N/V/F/Ch.  Past Medical History:  Diagnosis Date   Cataract march, april   bilateral removed    Chickenpox    Diabetes mellitus without complication (HCC)    metformin    Diabetic amyotrophy (HCC)    sees dr at [facility]   Gastric ulcer    Heart murmur    When he was younger, no recent issues   High blood pressure    controlled with meds   Hypercholesteremia    controlled with medication   Knee injury    left   Motion sickness    boats   Neuropathy     Current Outpatient Medications:    ACCU-CHEK GUIDE test strip, USE ONCE DAILY FASTING. DX CODE E11.9, Disp: 100 strip, Rfl: 12   Accu-Chek Softclix Lancets lancets, 1 each by Other route daily. Check sugar fasting daily. Dx Code E11.9., Disp: 100 each, Rfl: 3   amLODipine (NORVASC) 10 MG tablet, Take 1 tablet (10 mg total) by mouth daily., Disp: 100 tablet, Rfl: 2   aspirin EC 81 MG tablet, Take 81 mg by mouth daily. Swallow whole., Disp: , Rfl:    atorvastatin (LIPITOR) 40 MG tablet, TAKE 1 TABLET BY MOUTH EVERY DAY, Disp: 90 tablet, Rfl: 1   Blood Glucose Monitoring Suppl (ACCU-CHEK GUIDE ME) w/Device KIT, USE ONCE DAILY FASTING. DX CODE E11.9., Disp: 1 kit, Rfl: 0   fluticasone (FLONASE) 50 MCG/ACT nasal spray, SPRAY 2 SPRAYS INTO EACH NOSTRIL EVERY DAY, Disp:  11.1 mL, Rfl: 0   hydrochlorothiazide (MICROZIDE) 12.5 MG capsule, TAKE 1 CAPSULE (12.5 MG TOTAL) BY MOUTH DAILY. MUST KEEP APPT FOR FURTHER REFILLS, Disp: 90 capsule, Rfl: 1   ibuprofen (ADVIL) 800 MG tablet, Take 1 tablet (800 mg total) by mouth every 6 (six) hours as needed., Disp: 60 tablet, Rfl: 1   Multiple Vitamin (MULTIVITAMIN) capsule, Take 1 capsule by mouth daily., Disp: , Rfl:    Omega-3 Fatty Acids (FISH OIL PO), Take 1 capsule by mouth daily. , Disp: , Rfl:    Semaglutide,0.25 or 0.5MG /DOS, (OZEMPIC, 0.25 OR 0.5 MG/DOSE,) 2 MG/3ML SOPN, Inject 0.5 mg into the skin once a week., Disp: 9 mL, Rfl: 3   sertraline (ZOLOFT) 100 MG tablet, TAKE 1 TABLET BY MOUTH EVERY DAY, Disp: 90 tablet, Rfl: 2   sodium chloride 0.9 % infusion, Inject into the vein., Disp: , Rfl:    buPROPion (WELLBUTRIN XL) 300 MG 24 hr tablet, Take by mouth., Disp: , Rfl:   Social History   Tobacco Use  Smoking Status Former   Types: Cigars  Smokeless Tobacco Never  Tobacco Comments   one cigar once a month to once every 2 months     No Known Allergies Objective:  There were no vitals  filed for this visit. Body mass index is 22.04 kg/m. Constitutional Well developed. Well nourished.  Vascular Dorsalis pedis pulses palpable bilaterally. Posterior tibial pulses palpable bilaterally. Capillary refill normal to all digits.  No cyanosis or clubbing noted. Pedal hair growth normal.  Neurologic Normal speech. Oriented to person, place, and time. Epicritic sensation to light touch grossly present bilaterally.  Dermatologic Nails well groomed and normal in appearance. No open wounds. No skin lesions.  Orthopedic: Hammertoe contracture noted of 1 through 5 with hallux malleus semirigid.  2 through 5 semirigid hammertoe contractures noted.  Pain on palpation to the contractures.   Radiographs: None Assessment:   1. Hammertoe, bilateral    Plan:  Patient was evaluated and treated and all questions  answered.  Hallux malleus with IPJ contracture and hammertoe contractures 2 through 5 left foot with each respective -All questions and concerns were discussed with the patient in extensive detail -Given that patient has failed conservative care with shoe gear modification padding protecting.  Patient will benefit from surgical correction Sev reconstruction of digits 1 through 5 with hallux IPJ fusion with hammertoe arthroplasty of 2 and flexor tenotomy of 3 through 5 with fixation I discussed my preoperative intra postop plan with the patient in extensive detail.  He states understand like to proceed with surgery -Informed surgical risk consent was reviewed and read aloud to the patient.  I reviewed the films.  I have discussed my findings with the patient in great detail.  I have discussed all risks including but not limited to infection, stiffness, scarring, limp, disability, deformity, damage to blood vessels and nerves, numbness, poor healing, need for braces, arthritis, chronic pain, amputation, death.  All benefits and realistic expectations discussed in great detail.  I have made no promises as to the outcome.  I have provided realistic expectations.  I have offered the patient a 2nd opinion, which they have declined and assured me they preferred to proceed despite the risks   No follow-ups on file.

## 2023-12-06 ENCOUNTER — Telehealth: Payer: Self-pay | Admitting: Podiatry

## 2023-12-06 NOTE — Telephone Encounter (Signed)
DOS-01/08/24  TENOTOMY 3-5 LT-[id number] HAMMERTOE REPAIR 2ND [id number] HALLUX IPJ FUSION [id number]  [facility] EFFECTIVE DATE-10/25/23  PER THE [employer] PORTAL, PRIOR AUTH IS NOT REQUIRED FOR CPT CODES [id number] AND [id number].  AUTH Decision ID #: [id number].

## 2023-12-21 ENCOUNTER — Ambulatory Visit: Payer: 59 | Admitting: Physician Assistant

## 2023-12-27 ENCOUNTER — Telehealth: Payer: Self-pay

## 2023-12-27 NOTE — Telephone Encounter (Signed)
 Pt needs a TOC. Please call pt and schedule a TOC

## 2024-01-10 ENCOUNTER — Other Ambulatory Visit: Payer: Self-pay

## 2024-01-10 MED ORDER — AMLODIPINE BESYLATE 10 MG PO TABS: 10.0000 mg | ORAL_TABLET | Freq: Every day | ORAL | 0 refills | Status: DC

## 2024-01-13 DIAGNOSIS — G6181 Chronic inflammatory demyelinating polyneuritis: Secondary | ICD-10-CM | POA: Diagnosis not present

## 2024-01-14 DIAGNOSIS — G6181 Chronic inflammatory demyelinating polyneuritis: Secondary | ICD-10-CM | POA: Diagnosis not present

## 2024-01-15 DIAGNOSIS — G6181 Chronic inflammatory demyelinating polyneuritis: Secondary | ICD-10-CM | POA: Diagnosis not present

## 2024-01-16 ENCOUNTER — Encounter: Payer: 59 | Admitting: Podiatry

## 2024-01-17 DIAGNOSIS — G6181 Chronic inflammatory demyelinating polyneuritis: Secondary | ICD-10-CM | POA: Diagnosis not present

## 2024-01-29 ENCOUNTER — Encounter

## 2024-01-29 DIAGNOSIS — E785 Hyperlipidemia, unspecified: Secondary | ICD-10-CM

## 2024-01-29 DIAGNOSIS — E119 Type 2 diabetes mellitus without complications: Secondary | ICD-10-CM

## 2024-01-30 ENCOUNTER — Encounter: Payer: 59 | Admitting: Podiatry

## 2024-02-01 NOTE — Progress Notes (Signed)
 This encounter was created in error - please disregard.

## 2024-02-12 ENCOUNTER — Ambulatory Visit: Admitting: Internal Medicine

## 2024-02-19 ENCOUNTER — Encounter: Payer: Self-pay | Admitting: Internal Medicine

## 2024-02-19 ENCOUNTER — Ambulatory Visit (INDEPENDENT_AMBULATORY_CARE_PROVIDER_SITE_OTHER): Admitting: Internal Medicine

## 2024-02-19 VITALS — BP 116/70 | HR 106 | Temp 97.9°F | Ht 71.0 in | Wt 153.0 lb

## 2024-02-19 DIAGNOSIS — G6181 Chronic inflammatory demyelinating polyneuritis: Secondary | ICD-10-CM | POA: Diagnosis not present

## 2024-02-19 DIAGNOSIS — Z7985 Long-term (current) use of injectable non-insulin antidiabetic drugs: Secondary | ICD-10-CM

## 2024-02-19 DIAGNOSIS — E119 Type 2 diabetes mellitus without complications: Secondary | ICD-10-CM | POA: Diagnosis not present

## 2024-02-19 LAB — POCT GLYCOSYLATED HEMOGLOBIN (HGB A1C): Hemoglobin A1C: 4.8 % (ref 4.0–5.6)

## 2024-02-19 MED ORDER — OZEMPIC (0.25 OR 0.5 MG/DOSE) 2 MG/3ML ~~LOC~~ SOPN: 0.2500 mg | PEN_INJECTOR | SUBCUTANEOUS | Status: DC

## 2024-02-19 NOTE — Assessment & Plan Note (Signed)
-   Patient has a history of diabetes with the last A1c being 5 in February of last year. -Patient states that he did have a nurse come home and check an A1c and it was in the fours -Patient is on Ozempic  0.5 mg weekly but does complain of occasional nausea and is concerned about possible weight loss with this -A1c done today in the office was 4.8 -We discussed possibly coming off the Ozempic  but he was concerned about stopping the medication suddenly -We will decrease the dose of Ozempic  to 0.25 mg weekly and he will follow-up with Dr. Georgeanne King in August for repeat A1c -If his A1c still remains low we can discontinue this medication -No further workup at this time

## 2024-02-19 NOTE — Patient Instructions (Addendum)
-   It was a pleasure meeting you today -We will check an A1c on you today.  Can consider stopping the Ozempic  if your A1c is below goal -I have filled out the paperwork for your current incontinence supplies.  You should be able to obtain them from the medical supply company -Please contact us  if you have any questions or concerns

## 2024-02-19 NOTE — Progress Notes (Signed)
 Acute Office Visit  Subjective:     Patient ID: Michael Montgomery, male    DOB: 07-Feb-1961, 63 y.o.   MRN: [id number]  Chief Complaint  Patient presents with   urine incontinence    HPI Patient is in today for urinary incontinence supplies.  Patient states that he is able to feel when he has to go to the bathroom including his bowels and bladder but given his significantly limited mobility from his CIDP he needs assistance in time to get to the bathroom and therefore requires urine incontinence supplies.  Patient states that he occasionally gets nauseous with Ozempic  and is wondering if he can come off this medication.  He states that he had a home nurse come in and check an A1c on him and it was in the fours.  He also was concerned about the weight loss with the Ozempic   Review of Systems  Constitutional: Negative.  Negative for chills and fever.  Respiratory: Negative.    Cardiovascular: Negative.   Gastrointestinal:  Positive for nausea.  Genitourinary: Negative.   Musculoskeletal: Negative.   Neurological:  Positive for focal weakness and weakness.       Chronic bilateral lower extremity weakness  Psychiatric/Behavioral: Negative.          Objective:    BP 116/70   Pulse (!) 106   Temp 97.9 F (36.6 C) (Oral)   Ht 5\' 11"  (1.803 m) Comment: pt reported  Wt 153 lb (69.4 kg) Comment: pt reported  SpO2 99%   BMI 21.34 kg/m    Physical Exam Constitutional:      Appearance: Normal appearance.  HENT:     Head: Normocephalic and atraumatic.  Cardiovascular:     Rate and Rhythm: Normal rate and regular rhythm.     Heart sounds: Normal heart sounds.  Pulmonary:     Effort: Pulmonary effort is normal.     Breath sounds: Normal breath sounds. No wheezing or rales.  Neurological:     Mental Status: He is alert and oriented to person, place, and time.     Comments: Patient with significant limited power bilateral lower extremities (1 out of 5 at the hip, knee and  ankle).  Patient able to wiggle his toes slightly  Psychiatric:        Mood and Affect: Mood normal.        Behavior: Behavior normal.     Results for orders placed or performed in visit on 02/19/24  POCT HgB A1C  Result Value Ref Range   Hemoglobin A1C 4.8 4.0 - 5.6 %   HbA1c POC (<> result, manual entry)     HbA1c, POC (prediabetic range)     HbA1c, POC (controlled diabetic range)          Assessment & Plan:   Problem List Items Addressed This Visit       Endocrine   Diabetes (HCC) - Primary (Chronic)   - Patient has a history of diabetes with the last A1c being 5 in February of last year. -Patient states that he did have a nurse come home and check an A1c and it was in the fours -Patient is on Ozempic  0.5 mg weekly but does complain of occasional nausea and is concerned about possible weight loss with this -A1c done today in the office was 4.8 -We discussed possibly coming off the Ozempic  but he was concerned about stopping the medication suddenly -We will decrease the dose of Ozempic  to 0.25 mg weekly and  he will follow-up with Dr. Georgeanne King in August for repeat A1c -If his A1c still remains low we can discontinue this medication -No further workup at this time      Relevant Orders   POCT HgB A1C (Completed)     Nervous and Auditory   CIDP (chronic inflammatory demyelinating polyneuropathy) (HCC) (Chronic)   - Patient with a history of CIDP diagnosed in 2017.  Patient states that he was initially walking when this was diagnosed but in 2020 he became limited to wheelchair after he got COVID -Patient strength in his lower extremities is minimal with 1 out of 5 power noted at his hip knee and ankle bilaterally -Patient states that he is able to feel when he needs to go to the bathroom but is limited secondary to requiring assistance to go from the wheelchair to the toilet and get to the toilet on time -He does require urinary incontinence supplies secondary to this -I have  signed the paperwork for the home medical supply company today -Patient states that he will follow-up with neurology later this year for his yearly appointment and will continue to receive IVIG every 4 weeks -No further workup at this time       No orders of the defined types were placed in this encounter.   No follow-ups on file.  Dannon Perlow, MD

## 2024-02-19 NOTE — Assessment & Plan Note (Signed)
-   Patient with a history of CIDP diagnosed in 2017.  Patient states that he was initially walking when this was diagnosed but in 2020 he became limited to wheelchair after he got COVID -Patient strength in his lower extremities is minimal with 1 out of 5 power noted at his hip knee and ankle bilaterally -Patient states that he is able to feel when he needs to go to the bathroom but is limited secondary to requiring assistance to go from the wheelchair to the toilet and get to the toilet on time -He does require urinary incontinence supplies secondary to this -I have signed the paperwork for the home medical supply company today -Patient states that he will follow-up with neurology later this year for his yearly appointment and will continue to receive IVIG every 4 weeks -No further workup at this time

## 2024-02-22 ENCOUNTER — Telehealth: Payer: Self-pay

## 2024-02-22 NOTE — Telephone Encounter (Signed)
 Copied from CRM [id number]. Topic: General - Other >> Feb 22, 2024 10:50 AM Albertha Alosa wrote: Reason for CRM: Patient called in wanting to know the status of supplies over to [facility] supplies, would like for someone to give him a callback with a status

## 2024-02-23 NOTE — Telephone Encounter (Signed)
 Pt is aware that the order for the supplies was faxed over to [facility] yesterday.

## 2024-02-26 ENCOUNTER — Telehealth: Payer: Self-pay | Admitting: Podiatry

## 2024-02-26 NOTE — Telephone Encounter (Signed)
 Pt called and left message stating he wanted to r/s his surgery from 5/19 to July.  I returned call and we have changed it to 04/29/24 and surgery center has been notified.

## 2024-03-07 ENCOUNTER — Ambulatory Visit: Payer: 59 | Admitting: Podiatry

## 2024-03-09 DIAGNOSIS — G6181 Chronic inflammatory demyelinating polyneuritis: Secondary | ICD-10-CM | POA: Diagnosis not present

## 2024-03-10 DIAGNOSIS — G6181 Chronic inflammatory demyelinating polyneuritis: Secondary | ICD-10-CM | POA: Diagnosis not present

## 2024-03-12 DIAGNOSIS — G6181 Chronic inflammatory demyelinating polyneuritis: Secondary | ICD-10-CM | POA: Diagnosis not present

## 2024-03-13 DIAGNOSIS — G6181 Chronic inflammatory demyelinating polyneuritis: Secondary | ICD-10-CM | POA: Diagnosis not present

## 2024-03-14 ENCOUNTER — Telehealth: Payer: Self-pay | Admitting: Podiatry

## 2024-03-14 NOTE — Telephone Encounter (Signed)
 DOS: 04/29/24  TENOTOMY 3-5 (LT) -28010 HAMMER TOE REPAIR 2ND ([id number] HALLUX IPJ FUSION ([id number]     EFFECTIVE DATE :  01/23/2024   DEDUCTIBLE : $257.00   REMAINING:  $0.00   OOP:  $9,350.00   REMAINING:  $9,089.35   COINSURANCE:    20%  PER THE [employer] PROVIDER PORTAL NO PRIOR AUTH IS REQ FOR CPT CODES [phone]   REF# [id number]

## 2024-03-19 ENCOUNTER — Encounter: Admitting: Podiatry

## 2024-04-01 ENCOUNTER — Ambulatory Visit (INDEPENDENT_AMBULATORY_CARE_PROVIDER_SITE_OTHER): Admitting: Podiatry

## 2024-04-01 DIAGNOSIS — Z91198 Patient's noncompliance with other medical treatment and regimen for other reason: Secondary | ICD-10-CM

## 2024-04-02 ENCOUNTER — Encounter: Admitting: Podiatry

## 2024-04-06 NOTE — Progress Notes (Signed)
 1. Failure to attend appointment with reason given    Rescheduled by patient.

## 2024-04-15 ENCOUNTER — Other Ambulatory Visit: Payer: Self-pay | Admitting: Internal Medicine

## 2024-04-15 MED ORDER — HYDROCHLOROTHIAZIDE 12.5 MG PO CAPS: 12.5000 mg | ORAL_CAPSULE | Freq: Every day | ORAL | 0 refills | Status: DC

## 2024-04-15 NOTE — Telephone Encounter (Unsigned)
 Copied from CRM [id number]. Topic: Clinical - Medication Refill >> Apr 15, 2024  9:14 AM Emylou G wrote: Medication: hydrochlorothiazide  (MICROZIDE ) 12.5 MG capsule  Has the patient contacted their pharmacy? Yes (Agent: If no, request that the patient contact the pharmacy for the refill. If patient does not wish to contact the pharmacy document the reason why and proceed with request.) (Agent: If yes, when and what did the pharmacy advise?) said to call us   This is the patient's preferred pharmacy:  [facility] [phone] [place], [place] - [address] [place] [address] Phone: [phone] Fax: [phone]   Is this the correct pharmacy for this prescription? Yes If no, delete pharmacy and type the correct one.   Has the prescription been filled recently? No  Is the patient out of the medication? Yes  Has the patient been seen for an appointment in the last year OR does the patient have an upcoming appointment? Yes  Can we respond through MyChart? Yes  Agent: Please be advised that Rx refills may take up to 3 business days. We ask that you follow-up with your pharmacy.

## 2024-04-15 NOTE — Telephone Encounter (Signed)
 Pt scheduled to see Dr Abbey on 06/12/24. Last filled on 10/12/23 for #90 with one refill.   Last labs checked in May 2024

## 2024-04-19 ENCOUNTER — Ambulatory Visit: Admitting: Podiatry

## 2024-04-29 ENCOUNTER — Other Ambulatory Visit: Payer: Self-pay | Admitting: Podiatry

## 2024-04-29 DIAGNOSIS — M2042 Other hammer toe(s) (acquired), left foot: Secondary | ICD-10-CM | POA: Diagnosis not present

## 2024-04-29 DIAGNOSIS — M2011 Hallux valgus (acquired), right foot: Secondary | ICD-10-CM | POA: Diagnosis not present

## 2024-04-29 DIAGNOSIS — M19071 Primary osteoarthritis, right ankle and foot: Secondary | ICD-10-CM | POA: Diagnosis not present

## 2024-04-29 DIAGNOSIS — M2012 Hallux valgus (acquired), left foot: Secondary | ICD-10-CM | POA: Diagnosis not present

## 2024-04-29 DIAGNOSIS — M19072 Primary osteoarthritis, left ankle and foot: Secondary | ICD-10-CM | POA: Diagnosis not present

## 2024-04-29 MED ORDER — IBUPROFEN 800 MG PO TABS: 800.0000 mg | ORAL_TABLET | Freq: Four times a day (QID) | ORAL | 1 refills | Status: DC | PRN

## 2024-04-29 MED ORDER — OXYCODONE-ACETAMINOPHEN 5-325 MG PO TABS: 1.0000 | ORAL_TABLET | ORAL | 0 refills | Status: DC | PRN

## 2024-05-07 ENCOUNTER — Encounter: Admitting: Podiatry

## 2024-05-07 DIAGNOSIS — G6181 Chronic inflammatory demyelinating polyneuritis: Secondary | ICD-10-CM | POA: Diagnosis not present

## 2024-05-08 ENCOUNTER — Other Ambulatory Visit: Payer: Self-pay | Admitting: Internal Medicine

## 2024-05-08 DIAGNOSIS — E785 Hyperlipidemia, unspecified: Secondary | ICD-10-CM

## 2024-05-08 MED ORDER — ATORVASTATIN CALCIUM 40 MG PO TABS: 40.0000 mg | ORAL_TABLET | Freq: Every day | ORAL | 1 refills | Status: DC

## 2024-05-08 NOTE — Telephone Encounter (Signed)
 Copied from CRM [id number]. Topic: Clinical - Medication Refill >> May 08, 2024 10:35 AM Mercedes MATSU wrote: Medication:  atorvastatin  (LIPITOR) 40 MG tablet   Has the patient contacted their pharmacy? Yes (Agent: If no, request that the patient contact the pharmacy for the refill. If patient does not wish to contact the pharmacy document the reason why and proceed with request.) (Agent: If yes, when and what did the pharmacy advise?)  This is the patient's preferred pharmacy:  [facility] [phone] [place], [place] - [address] [place] [address] Phone: [phone] Fax: [phone]  Is this the correct pharmacy for this prescription? Yes If no, delete pharmacy and type the correct one.   Has the prescription been filled recently? Yes  Is the patient out of the medication? Yes  Has the patient been seen for an appointment in the last year OR does the patient have an upcoming appointment? Yes  Can we respond through MyChart? Yes  Agent: Please be advised that Rx refills may take up to 3 business days. We ask that you follow-up with your pharmacy.

## 2024-05-09 DIAGNOSIS — G6181 Chronic inflammatory demyelinating polyneuritis: Secondary | ICD-10-CM | POA: Diagnosis not present

## 2024-05-10 DIAGNOSIS — G6181 Chronic inflammatory demyelinating polyneuritis: Secondary | ICD-10-CM | POA: Diagnosis not present

## 2024-05-11 DIAGNOSIS — G6181 Chronic inflammatory demyelinating polyneuritis: Secondary | ICD-10-CM | POA: Diagnosis not present

## 2024-05-12 DIAGNOSIS — G6181 Chronic inflammatory demyelinating polyneuritis: Secondary | ICD-10-CM | POA: Diagnosis not present

## 2024-05-15 ENCOUNTER — Telehealth: Payer: Self-pay | Admitting: Family Medicine

## 2024-05-15 MED ORDER — ACCU-CHEK GUIDE TEST VI STRP: ORAL_STRIP | 12 refills | Status: AC

## 2024-05-15 NOTE — Telephone Encounter (Signed)
 Copied from CRM #[id number]. Topic: Clinical - Medication Refill >> May 15, 2024 11:52 AM Revonda D wrote: Medication: ACCU-CHEK GUIDE test strip   Has the patient contacted their pharmacy? Yes (Agent: If no, request that the patient contact the pharmacy for the refill. If patient does not wish to contact the pharmacy document the reason why and proceed with request.) (Agent: If yes, when and what did the pharmacy advise?)  This is the patient's preferred pharmacy:  [phone] [place], [place] - [address] [place] [address] Phone: [phone] Fax: [phone]   Is this the correct pharmacy for this prescription? Yes If no, delete pharmacy and type the correct one.   Has the prescription been filled recently? No  Is the patient out of the medication? Yes  Has the patient been seen for an appointment in the last year OR does the patient have an upcoming appointment? Yes  Can we respond through MyChart? Yes  Agent: Please be advised that Rx refills may take up to 3 business days. We ask that you follow-up with your pharmacy.

## 2024-05-15 NOTE — Telephone Encounter (Signed)
 Refill sent in

## 2024-05-15 NOTE — Telephone Encounter (Signed)
 Patient requesting accu check guide test strip, Unable to order, epic states that new order is needed

## 2024-05-15 NOTE — Addendum Note (Signed)
 Addended by: ORLANDO KINGDOM on: 05/15/2024 03:00 PM   Modules accepted: Orders

## 2024-05-21 ENCOUNTER — Ambulatory Visit (INDEPENDENT_AMBULATORY_CARE_PROVIDER_SITE_OTHER): Admitting: Podiatry

## 2024-05-21 ENCOUNTER — Ambulatory Visit

## 2024-05-21 DIAGNOSIS — M2042 Other hammer toe(s) (acquired), left foot: Secondary | ICD-10-CM

## 2024-05-21 NOTE — Progress Notes (Signed)
 Subjective:  Patient ID: Michael Montgomery, male    DOB: 12/05/1960,  MRN: [id number]  Chief Complaint  Patient presents with   Routine Post Op    OV # 2 DOS 03/11/24 --- HALLUX IPJ FUSION WITH FIXATION, LEFT 2ND DIGIT HAMMERTOE ARTHROPLASTY WITH FLEXOR TENOTOMY OF 3-5 LEFT    DOS: 03/11/2021 referred Procedure: Left hallux IPJ fusion with left second digit hammertoe arthroplasty with flexor tenotomy L3-5  63 y.o. male returns for post-op check.  He states he is doing well.  Denies any other acute complaints.  Pain is controlled.  He is a here to get his stitches out.  Healing well.  Review of Systems: Negative except as noted in the HPI. Denies N/V/F/Ch.  Past Medical History:  Diagnosis Date   Cataract march, april   bilateral removed    Chickenpox    Diabetes mellitus without complication (HCC)    metformin     Diabetic amyotrophy (HCC)    sees dr at [facility]   Gastric ulcer    Heart murmur    When he was younger, no recent issues   High blood pressure    controlled with meds   Hypercholesteremia    controlled with medication   Knee injury    left   Motion sickness    boats   Neuropathy     Current Outpatient Medications:    ACCU-CHEK GUIDE test strip, USE ONCE DAILY FASTING. DX CODE E11.9, Disp: 100 strip, Rfl: 12   Accu-Chek Softclix Lancets lancets, 1 each by Other route daily. Check sugar fasting daily. Dx Code E11.9., Disp: 100 each, Rfl: 3   amLODipine  (NORVASC ) 10 MG tablet, Take 1 tablet (10 mg total) by mouth daily., Disp: 100 tablet, Rfl: 0   aspirin  EC 81 MG tablet, Take 81 mg by mouth daily. Swallow whole., Disp: , Rfl:    atorvastatin  (LIPITOR) 40 MG tablet, Take 1 tablet (40 mg total) by mouth daily., Disp: 30 tablet, Rfl: 1   Blood Glucose Monitoring Suppl (ACCU-CHEK GUIDE ME) w/Device KIT, USE ONCE DAILY FASTING. DX CODE E11.9., Disp: 1 kit, Rfl: 0   buPROPion (WELLBUTRIN XL) 300 MG 24 hr tablet, Take by mouth., Disp: , Rfl:    fluticasone  (FLONASE ) 50  MCG/ACT nasal spray, SPRAY 2 SPRAYS INTO EACH NOSTRIL EVERY DAY, Disp: 11.1 mL, Rfl: 0   glucose blood (ACCU-CHEK GUIDE TEST) test strip, USE ONCE DAILY FASTING, Disp: 100 each, Rfl: 12   hydrochlorothiazide  (MICROZIDE ) 12.5 MG capsule, Take 1 capsule (12.5 mg total) by mouth daily. MUST KEEP APPT FOR FURTHER REFILLS, Disp: 60 capsule, Rfl: 0   ibuprofen  (ADVIL ) 800 MG tablet, Take 1 tablet (800 mg total) by mouth every 6 (six) hours as needed. (Patient not taking: Reported on 02/19/2024), Disp: 60 tablet, Rfl: 1   ibuprofen  (ADVIL ) 800 MG tablet, Take 1 tablet (800 mg total) by mouth every 6 (six) hours as needed., Disp: 60 tablet, Rfl: 1   Multiple Vitamin (MULTIVITAMIN) capsule, Take 1 capsule by mouth daily., Disp: , Rfl:    Omega-3 Fatty Acids (FISH OIL PO), Take 1 capsule by mouth daily. , Disp: , Rfl:    oxyCODONE -acetaminophen  (PERCOCET) 5-325 MG tablet, Take 1 tablet by mouth every 4 (four) hours as needed for severe pain (pain score 7-10)., Disp: 30 tablet, Rfl: 0   Semaglutide ,0.25 or 0.5MG /DOS, (OZEMPIC , 0.25 OR 0.5 MG/DOSE,) 2 MG/3ML SOPN, Inject 0.25 mg into the skin once a week., Disp: , Rfl:    sertraline  (ZOLOFT ) 100 MG  tablet, TAKE 1 TABLET BY MOUTH EVERY DAY (Patient not taking: Reported on 02/19/2024), Disp: 90 tablet, Rfl: 2   sodium chloride  0.9 % infusion, Inject into the vein., Disp: , Rfl:   Social History   Tobacco Use  Smoking Status Former   Types: Cigars  Smokeless Tobacco Never  Tobacco Comments   one cigar once a month to once every 2 months     No Known Allergies Objective:  There were no vitals filed for this visit. There is no height or weight on file to calculate BMI. Constitutional Well developed. Well nourished.  Vascular Foot warm and well perfused. Capillary refill normal to all digits.   Neurologic Normal speech. Oriented to person, place, and time. Epicritic sensation to light touch grossly present bilaterally.  Dermatologic Skin healing well  without signs of infection. Skin edges well coapted without signs of infection.  Orthopedic: Tenderness to palpation noted about the surgical site.   Radiographs: None Assessment:   1. Hammertoe of left foot    Plan:  Patient was evaluated and treated and all questions answered.  S/p foot surgery left -Progressing as expected post-operatively. -XR: See above -WB Status: Weightbearing as tolerated in surgical shoe -Sutures: Removed.  No clinical signs of dehiscence noted no complication noted. -Medications: None -Foot redressed.  No follow-ups on file.

## 2024-06-11 ENCOUNTER — Ambulatory Visit (INDEPENDENT_AMBULATORY_CARE_PROVIDER_SITE_OTHER): Admitting: Podiatry

## 2024-06-11 DIAGNOSIS — M2042 Other hammer toe(s) (acquired), left foot: Secondary | ICD-10-CM

## 2024-06-11 DIAGNOSIS — Z9889 Other specified postprocedural states: Secondary | ICD-10-CM

## 2024-06-11 NOTE — Progress Notes (Signed)
 Subjective:  Patient ID: Michael Montgomery, male    DOB: 05/31/1961,  MRN: [id number]  Chief Complaint  Patient presents with   Routine Post Op    POST OP-HALLUX IPJ FUSION WITH FIXATION, LEFT 2ND DIGIT HAMMERTOE ARTHROPLASTY WITH FLEXOR TENOTOMY OF 3-5 LEFT    DOS: 03/11/2021 referred Procedure: Left hallux IPJ fusion with left second digit hammertoe arthroplasty with flexor tenotomy L3-5  63 y.o. male returns for post-op check.  He states he is doing well.  Denies any other acute complaints.  Pain is controlled.  He is a here to get his stitches out.  Healing well.  Review of Systems: Negative except as noted in the HPI. Denies N/V/F/Ch.  Past Medical History:  Diagnosis Date   Cataract march, april   bilateral removed    Chickenpox    Diabetes mellitus without complication (HCC)    metformin     Diabetic amyotrophy (HCC)    sees dr at [facility]   Gastric ulcer    Heart murmur    When he was younger, no recent issues   High blood pressure    controlled with meds   Hypercholesteremia    controlled with medication   Knee injury    left   Motion sickness    boats   Neuropathy     Current Outpatient Medications:    ACCU-CHEK GUIDE test strip, USE ONCE DAILY FASTING. DX CODE E11.9, Disp: 100 strip, Rfl: 12   Accu-Chek Softclix Lancets lancets, 1 each by Other route daily. Check sugar fasting daily. Dx Code E11.9., Disp: 100 each, Rfl: 3   amLODipine  (NORVASC ) 10 MG tablet, Take 1 tablet (10 mg total) by mouth daily., Disp: 100 tablet, Rfl: 0   aspirin  EC 81 MG tablet, Take 81 mg by mouth daily. Swallow whole., Disp: , Rfl:    atorvastatin  (LIPITOR) 40 MG tablet, Take 1 tablet (40 mg total) by mouth daily., Disp: 30 tablet, Rfl: 1   Blood Glucose Monitoring Suppl (ACCU-CHEK GUIDE ME) w/Device KIT, USE ONCE DAILY FASTING. DX CODE E11.9., Disp: 1 kit, Rfl: 0   buPROPion (WELLBUTRIN XL) 300 MG 24 hr tablet, Take by mouth., Disp: , Rfl:    fluticasone  (FLONASE ) 50 MCG/ACT nasal  spray, SPRAY 2 SPRAYS INTO EACH NOSTRIL EVERY DAY, Disp: 11.1 mL, Rfl: 0   glucose blood (ACCU-CHEK GUIDE TEST) test strip, USE ONCE DAILY FASTING, Disp: 100 each, Rfl: 12   hydrochlorothiazide  (MICROZIDE ) 12.5 MG capsule, Take 1 capsule (12.5 mg total) by mouth daily. MUST KEEP APPT FOR FURTHER REFILLS, Disp: 60 capsule, Rfl: 0   ibuprofen  (ADVIL ) 800 MG tablet, Take 1 tablet (800 mg total) by mouth every 6 (six) hours as needed. (Patient not taking: Reported on 02/19/2024), Disp: 60 tablet, Rfl: 1   ibuprofen  (ADVIL ) 800 MG tablet, Take 1 tablet (800 mg total) by mouth every 6 (six) hours as needed., Disp: 60 tablet, Rfl: 1   Multiple Vitamin (MULTIVITAMIN) capsule, Take 1 capsule by mouth daily., Disp: , Rfl:    Omega-3 Fatty Acids (FISH OIL PO), Take 1 capsule by mouth daily. , Disp: , Rfl:    oxyCODONE -acetaminophen  (PERCOCET) 5-325 MG tablet, Take 1 tablet by mouth every 4 (four) hours as needed for severe pain (pain score 7-10)., Disp: 30 tablet, Rfl: 0   Semaglutide ,0.25 or 0.5MG /DOS, (OZEMPIC , 0.25 OR 0.5 MG/DOSE,) 2 MG/3ML SOPN, Inject 0.25 mg into the skin once a week., Disp: , Rfl:    sertraline  (ZOLOFT ) 100 MG tablet, TAKE 1 TABLET BY  MOUTH EVERY DAY (Patient not taking: Reported on 02/19/2024), Disp: 90 tablet, Rfl: 2   sodium chloride  0.9 % infusion, Inject into the vein., Disp: , Rfl:   Social History   Tobacco Use  Smoking Status Former   Types: Cigars  Smokeless Tobacco Never  Tobacco Comments   one cigar once a month to once every 2 months     No Known Allergies Objective:  There were no vitals filed for this visit. There is no height or weight on file to calculate BMI. Constitutional Well developed. Well nourished.  Vascular Foot warm and well perfused. Capillary refill normal to all digits.   Neurologic Normal speech. Oriented to person, place, and time. Epicritic sensation to light touch grossly present bilaterally.  Dermatologic Skin completely epithelialized no  signs of dehiscence noted no complication noted.  Good reduction of deformity noted.  No contractures noted.  Orthopedic: Tenderness to palpation noted about the surgical site.   Radiographs: None Assessment:   1. Hammertoe of left foot   2. Status post foot surgery     Plan:  Patient was evaluated and treated and all questions answered.  S/p foot surgery left - Clinically and officially discharged from [facility] reduction of toe deformities noted.  At this time if any foot and ankle issues arise in the future he will come back and see me.  He states understanding.  No follow-ups on file.

## 2024-06-12 ENCOUNTER — Ambulatory Visit

## 2024-06-12 VITALS — BP 122/80 | HR 101 | Temp 98.2°F | Ht 71.0 in | Wt 145.0 lb

## 2024-06-12 DIAGNOSIS — E1169 Type 2 diabetes mellitus with other specified complication: Secondary | ICD-10-CM

## 2024-06-12 DIAGNOSIS — R7989 Other specified abnormal findings of blood chemistry: Secondary | ICD-10-CM

## 2024-06-12 DIAGNOSIS — K5900 Constipation, unspecified: Secondary | ICD-10-CM

## 2024-06-12 DIAGNOSIS — I10 Essential (primary) hypertension: Secondary | ICD-10-CM | POA: Diagnosis not present

## 2024-06-12 DIAGNOSIS — Z860100 Personal history of colon polyps, unspecified: Secondary | ICD-10-CM

## 2024-06-12 DIAGNOSIS — R195 Other fecal abnormalities: Secondary | ICD-10-CM | POA: Diagnosis not present

## 2024-06-12 DIAGNOSIS — Z7984 Long term (current) use of oral hypoglycemic drugs: Secondary | ICD-10-CM

## 2024-06-12 DIAGNOSIS — J301 Allergic rhinitis due to pollen: Secondary | ICD-10-CM | POA: Diagnosis not present

## 2024-06-12 DIAGNOSIS — F321 Major depressive disorder, single episode, moderate: Secondary | ICD-10-CM

## 2024-06-12 DIAGNOSIS — G6181 Chronic inflammatory demyelinating polyneuritis: Secondary | ICD-10-CM | POA: Diagnosis not present

## 2024-06-12 DIAGNOSIS — E119 Type 2 diabetes mellitus without complications: Secondary | ICD-10-CM

## 2024-06-12 DIAGNOSIS — E0843 Diabetes mellitus due to underlying condition with diabetic autonomic (poly)neuropathy: Secondary | ICD-10-CM | POA: Insufficient documentation

## 2024-06-12 DIAGNOSIS — E785 Hyperlipidemia, unspecified: Secondary | ICD-10-CM

## 2024-06-12 DIAGNOSIS — E782 Mixed hyperlipidemia: Secondary | ICD-10-CM | POA: Diagnosis not present

## 2024-06-12 LAB — COMPREHENSIVE METABOLIC PANEL WITH GFR
ALT: 159 U/L — ABNORMAL HIGH (ref 0–53)
AST: 72 U/L — ABNORMAL HIGH (ref 0–37)
Albumin: 4.3 g/dL (ref 3.5–5.2)
Alkaline Phosphatase: 142 U/L — ABNORMAL HIGH (ref 39–117)
BUN: 18 mg/dL (ref 6–23)
CO2: 31 meq/L (ref 19–32)
Calcium: 9.6 mg/dL (ref 8.4–10.5)
Chloride: 100 meq/L (ref 96–112)
Creatinine, Ser: 0.58 mg/dL (ref 0.40–1.50)
GFR: 105.39 mL/min (ref >60.00)
Glucose, Bld: 130 mg/dL — ABNORMAL HIGH (ref 70–99)
Potassium: 4.6 meq/L (ref 3.5–5.1)
Sodium: 138 meq/L (ref 135–145)
Total Bilirubin: 0.4 mg/dL (ref 0.2–1.2)
Total Protein: 7.5 g/dL (ref 6.0–8.3)

## 2024-06-12 LAB — LIPID PANEL
Cholesterol: 107 mg/dL (ref 0–200)
HDL: 35.7 mg/dL — ABNORMAL LOW (ref >39.00)
LDL Cholesterol: 56 mg/dL (ref 0–99)
NonHDL: 71.34
Total CHOL/HDL Ratio: 3
Triglycerides: 77 mg/dL (ref 0.0–149.0)
VLDL: 15.4 mg/dL (ref 0.0–40.0)

## 2024-06-12 LAB — HEMOGLOBIN A1C: Hgb A1c MFr Bld: 5.6 % (ref 4.6–6.5)

## 2024-06-12 LAB — MICROALBUMIN / CREATININE URINE RATIO
Creatinine,U: 58.4 mg/dL
Microalb Creat Ratio: 38.2 mg/g — ABNORMAL HIGH (ref 0.0–30.0)
Microalb, Ur: 2.2 mg/dL — ABNORMAL HIGH (ref 0.0–1.9)

## 2024-06-12 MED ORDER — HYDROCHLOROTHIAZIDE 12.5 MG PO CAPS: 12.5000 mg | ORAL_CAPSULE | Freq: Every day | ORAL | 3 refills | Status: AC

## 2024-06-12 MED ORDER — FLUTICASONE PROPIONATE 50 MCG/ACT NA SUSP: 1.0000 | Freq: Every day | NASAL | 2 refills | Status: DC | PRN

## 2024-06-12 MED ORDER — AMLODIPINE BESYLATE 10 MG PO TABS: 10.0000 mg | ORAL_TABLET | Freq: Every day | ORAL | 3 refills | Status: AC

## 2024-06-12 MED ORDER — METFORMIN HCL ER 500 MG PO TB24
500.0000 mg | ORAL_TABLET | Freq: Every day | ORAL | 3 refills | Status: AC
Start: 2024-06-12 — End: ?

## 2024-06-12 MED ORDER — ATORVASTATIN CALCIUM 40 MG PO TABS
40.0000 mg | ORAL_TABLET | Freq: Every day | ORAL | 3 refills | Status: AC
Start: 2024-06-12 — End: ?

## 2024-06-12 NOTE — Assessment & Plan Note (Signed)
 Plan per constipation and repeat CMP today.

## 2024-06-12 NOTE — Assessment & Plan Note (Signed)
 This is chronic and intermittent.  Recommend increasing fiber intake in his diet.  Printed information on high-fiber food provided today.  Also encouraged patient to increase daily water intake.  He has a history of positive FOBT and did not follow-up with GI in the past.  I discussed the need for GI evaluation, colorectal cancer screening.  He also has history of elevated liver enzymes GI referral made today for further evaluation.

## 2024-06-12 NOTE — Progress Notes (Signed)
 Established Patient Office Visit TOC from Dr. Maribeth    Subjective  Patient ID: Michael Montgomery, male    DOB: 06/26/1961  Age: 63 y.o. MRN: [id number]  Chief Complaint  Patient presents with   Establish Care    He  has a past medical history of Cataract (march, april), Chickenpox, Diabetes mellitus without complication (HCC), Diabetic amyotrophy (HCC), Gastric ulcer, Heart murmur, High blood pressure, Hypercholesteremia, Knee injury, Motion sickness, and Neuropathy.  HPI Discussed the use of AI scribe software for clinical note transcription with the patient, who gave verbal consent to proceed.  History of Present Illness Michael Montgomery is a 63 year old male with Chronic Inflammatory Demyelinating Polyneuropathy (CIDP) who presents to establish care and follow-up on chronic medical conditions.  - He has a history of CIDP, which has resulted in significant muscle and nerve disorder.  He is wheelchair-bound, on disability due to this.  He is following up with Dr. Maree at [facility] neurology and gets IVIG therapy once every 8 weeks.  In the past patient has tried at home physical therapy but currently does at home exercises as tolerated.  - MDD: Mostly related to CIDP and is on bupropion 300 mg daily.  Gets his refills through his neurologist.  He has not engaged in counseling despite a previous recommendation for psychological support.  - Has a history of type 2 diabetes with complications including diabetic retinopathy (evident on eye exam in 05/2022).  Previously developed DKA on SGLT2.  Has been on Ozempic  0.25 mg once weekly injectable but continues to have GI symptoms including nausea, bloating, abdominal discomfort for first few days after injection of Ozempic .  Previously he was on Ozempic  0.5 mg once weekly and dose was reduced due to GI side effect.  Patient has been treated with metformin  in the past and is interested in discontinuing Ozempic  and restarting metformin .  His  blood sugar readings at home have been around 120 mg/dL postprandially.  He has upcoming diabetic eye exam in 1 week at [facility].  He denies change in vision.  - He underwent bilateral hammer toe repair surgery, with the right foot operated on in April 2025 and the left in July 2025.  He continues to follow-up with podiatrist closely.   - He has a history of abnormal liver enzymes, positive fecal occult blood, chronic constipation, hemorrhoids.  Patient was referred to GI by previous PCP but due to physical condition he was not able to make it to his GI appointment in the past.  He continues to have constipation, hemorrhoids and is interested in establishing care with a gastroenterologist.  Patient is also due for colonoscopy but is concerned about colon prep given wheelchair bound.  He drinks about 6 glasses of tall water daily.  He does not eat high-fiber food.  - He takes amlodipine  10 mg, hydrochlorothiazid 12.5 mg daily for blood pressure.   - He is on Lipitor 40 mg, aspirin  81 mg daily.    - He has a history of allergic rhinitis and uses Flonase  as needed during spring with improvement in his symptoms.  Needs refill.    - He is currently studying criminal justice and aims to pursue a master's degree and eventually teach. He has a support system that includes a person who assists him for three and a half hours daily. He has access to [facility] transportation services.  - Immunization: He got his first dose of shingles through local pharmacy and plans on  updating his second dose of shingles.  He is due for pneumonia immunization.  ROS As per HPI    Objective:     BP 122/80 (BP Location: Right Arm, Patient Position: Sitting, Cuff Size: Small)   Pulse (!) 101   Temp 98.2 F (36.8 C) (Oral)   Ht 5' 11 (1.803 m)   Wt 145 lb (65.8 kg)   SpO2 98%   BMI 20.22 kg/m      06/12/2024   11:07 AM 02/19/2024   11:45 AM 08/01/2023    8:34 AM  Depression screen PHQ 2/9  Decreased  Interest 1 1 0  Down, Depressed, Hopeless 1 1 1   PHQ - 2 Score 2 2 1   Altered sleeping 1 3 1   Tired, decreased energy 1 1 1   Change in appetite 0 0 0  Feeling bad or failure about yourself  2 0 0  Trouble concentrating 1 0 0  Moving slowly or fidgety/restless 0 0 0  Suicidal thoughts 0 0 0  PHQ-9 Score 7 6 3   Difficult doing work/chores Somewhat difficult Not difficult at all Not difficult at all      06/12/2024   11:07 AM 02/19/2024   11:45 AM 02/27/2023    3:02 PM  GAD 7 : Generalized Anxiety Score  Nervous, Anxious, on Edge 0 0 0  Control/stop worrying 1 1 0  Worry too much - different things 1 0 0  Trouble relaxing 1 0 0  Restless 0 0 0  Easily annoyed or irritable 0 0 0  Afraid - awful might happen 1 0 0  Total GAD 7 Score 4 1 0  Anxiety Difficulty Somewhat difficult Not difficult at all Not difficult at all      06/12/2024   11:07 AM 02/19/2024   11:45 AM 08/01/2023    8:34 AM  Depression screen PHQ 2/9  Decreased Interest 1 1 0  Down, Depressed, Hopeless 1 1 1   PHQ - 2 Score 2 2 1   Altered sleeping 1 3 1   Tired, decreased energy 1 1 1   Change in appetite 0 0 0  Feeling bad or failure about yourself  2 0 0  Trouble concentrating 1 0 0  Moving slowly or fidgety/restless 0 0 0  Suicidal thoughts 0 0 0  PHQ-9 Score 7 6 3   Difficult doing work/chores Somewhat difficult Not difficult at all Not difficult at all      06/12/2024   11:07 AM 02/19/2024   11:45 AM 02/27/2023    3:02 PM  GAD 7 : Generalized Anxiety Score  Nervous, Anxious, on Edge 0 0 0  Control/stop worrying 1 1 0  Worry too much - different things 1 0 0  Trouble relaxing 1 0 0  Restless 0 0 0  Easily annoyed or irritable 0 0 0  Afraid - awful might happen 1 0 0  Total GAD 7 Score 4 1 0  Anxiety Difficulty Somewhat difficult Not difficult at all Not difficult at all   SDOH Screenings   Food Insecurity: No Food Insecurity (08/01/2023)  Housing: Low Risk  (08/01/2023)  Transportation Needs: No  Transportation Needs (08/01/2023)  Utilities: Not At Risk (08/01/2023)  Alcohol  Screen: Low Risk  (08/01/2023)  Depression (PHQ2-9): Medium Risk (06/12/2024)  Financial Resource Strain: Low Risk  (08/01/2023)  Physical Activity: Insufficiently Active (08/01/2023)  Social Connections: Moderately Isolated (08/01/2023)  Stress: No Stress Concern Present (08/01/2023)  Tobacco Use: Medium Risk (06/12/2024)  Health Literacy: Adequate Health Literacy (08/01/2023)  Physical Exam Constitutional:      General: He is not in acute distress. HENT:     Head: Normocephalic and atraumatic.     Right Ear: Tympanic membrane normal.     Left Ear: Tympanic membrane normal.     Mouth/Throat:     Mouth: Mucous membranes are moist.  Eyes:     Pupils: Pupils are equal, round, and reactive to light.  Cardiovascular:     Rate and Rhythm: Normal rate.     Heart sounds: No murmur heard. Pulmonary:     Breath sounds: No wheezing or rales.  Abdominal:     Palpations: Abdomen is soft.     Tenderness: There is no abdominal tenderness. There is no guarding or rebound.  Musculoskeletal:     Cervical back: Neck supple.     Right lower leg: No edema.     Left lower leg: No edema.  Neurological:     Mental Status: He is alert and oriented to person, place, and time.     Gait: Gait abnormal (in wheel chair).  Psychiatric:        Mood and Affect: Mood normal.        No results found for any visits on 06/12/24.  The ASCVD Risk score (Arnett DK, et al., 2019) failed to calculate for the following reasons:   The valid total cholesterol range is 130 to 320 mg/dL     Assessment & Plan:  Type 2 diabetes mellitus with other specified complication, without long-term current use of insulin  (HCC) -     Hemoglobin A1c -     Microalbumin / creatinine urine ratio -     AMB Referral VBCI Care Management -     metFORMIN  HCl ER; Take 1 tablet (500 mg total) by mouth daily with breakfast.  Dispense: 90 tablet; Refill:  3  Mixed hyperlipidemia Assessment & Plan: Patient has been tolerating atorvastatin  40 mg daily, continue.  Check lipid panel.  He did eat bacon this morning. Recommend incorporating Mediterranean like diet, printed information provided to patient.  Orders: -     Atorvastatin  Calcium ; Take 1 tablet (40 mg total) by mouth daily.  Dispense: 90 tablet; Refill: 3  Essential hypertension Assessment & Plan: Chronic, continue amlodipine  10 mg daily, hydrochlorothiazide  12.5 mg daily.  If urine microalbuminuria will add telmisartan  to patient's regimen.  Orders: -     amLODIPine  Besylate; Take 1 tablet (10 mg total) by mouth daily.  Dispense: 90 tablet; Refill: 3 -     hydroCHLOROthiazide ; Take 1 capsule (12.5 mg total) by mouth daily. MUST KEEP APPT FOR FURTHER REFILLS  Dispense: 90 capsule; Refill: 3 -     Comprehensive metabolic panel with GFR -     Lipid panel  Constipation, unspecified constipation type Assessment & Plan: This is chronic and intermittent.  Recommend increasing fiber intake in his diet.  Printed information on high-fiber food provided today.  Also encouraged patient to increase daily water intake.  He has a history of positive FOBT and did not follow-up with GI in the past.  I discussed the need for GI evaluation, colorectal cancer screening.  He also has history of elevated liver enzymes GI referral made today for further evaluation.  Orders: -     Ambulatory referral to Gastroenterology  Positive fecal occult blood test Assessment & Plan: As evident on lab on 03/24/2023 patient was referred to GI by his previous PCP.  However patient was not able to facilitate transport on the  day he was supposed to see gastroenterologist.  New referral to GI made today for further evaluation.  Patient does not have red flag symptoms like fever, chills, nausea, vomiting, unintentional weight loss.  Orders: -     Ambulatory referral to Gastroenterology  Seasonal allergic rhinitis due to  pollen Assessment & Plan: Chronic and symptoms stable with as needed Flonase  use.  Refill on nasal Flonase , use 1 puff in each nostril daily as needed was sent to the pharmacy.  Orders: -     Fluticasone  Propionate; Place 1 spray into both nostrils daily as needed for allergies or rhinitis.  Dispense: 11.1 mL; Refill: 2  CIDP (chronic inflammatory demyelinating polyneuropathy) (HCC) Assessment & Plan: Chronic, with complication including wheelchair bound. Managed by [facility] neurologist Dr. Loreli, continue follow-up. We discussed home health, continuing exercises that is tolerable and safe for the patient.  He is not interested in home health at this time.  We have referral for social worker made as patient suffers from disability due to neurological condition.   Orders: -     AMB Referral VBCI Care Management  Depression, major, single episode, moderate (HCC) Assessment & Plan: Patient is currently taking Wellbutrin 300 mg daily which has been prescribed by his neurologist.  He is not interested in behavioral intervention at this time.  No SI, HI, is motivated to go to law school. Continue follow-up with neurologist.   Elevated LFTs Assessment & Plan: Plan per constipation and repeat CMP today.   History of colon polyps Assessment & Plan: History of colon polyps and positive FOBT in the past.  Recommend GI referral.  Orders: -     Ambulatory referral to Gastroenterology  Diabetes mellitus due to underlying condition with diabetic autonomic neuropathy, without long-term current use of insulin  [facility]) Assessment & Plan: This is chronic with complications including diabetic retinopathy based upon diabetic eye exam from 05/2022, peripheral neuropathy.  He is noticing  of significant GI symptoms even on low-dose of Ozempic .  Recommend repeating A1c, urine microalbumin today.  Start metformin  500 mg once daily with food.  Recommend follow-up in 6 months.  Consider adding ARB to patient's  regimen if microalbuminuria present.  Diabetic foot exam not done today as he is established with podiatrist follows up with them on a regular basis.  Patient was also counseled on updating second dose of shingles and pneumonia vaccine.  He will bring his updated vaccine record during next visit.  Patient was also provided on community resources ([email or web address]-[email or web address]), referral to Child psychotherapist.    I personally spent a total of 50 minutes in the care of the patient today including preparing to see the patient, performing a medically appropriate exam/evaluation, counseling and educating, placing orders, referring and communicating with other health care professionals, documenting clinical information in the EHR, independently interpreting results, and communicating results.   Return in about 3 months (around 09/12/2024) for 3 M virtual visit for chronic f/u.   Luke Shade, MD

## 2024-06-12 NOTE — Assessment & Plan Note (Signed)
 As evident on lab on 03/24/2023 patient was referred to GI by his previous PCP.  However patient was not able to facilitate transport on the day he was supposed to see gastroenterologist.  New referral to GI made today for further evaluation.  Patient does not have red flag symptoms like fever, chills, nausea, vomiting, unintentional weight loss.

## 2024-06-12 NOTE — Patient Instructions (Addendum)
 If you are interested in the shingles vaccine series (Shingrix), call your insurance or pharmacy to check on coverage and location it must be given.  If affordable - you can schedule it here or at your pharmacy depending on coverage .   You also qualify for pneumonia vaccine, please check with [facility] next time you are there to update your vaccine.   Please have your eye doctor send an exam result after you have eye examined at [facility] eye center next week.   Recommend fiber rich food, try to get about 30 grams of fiber each day with increased water intake (about 40 oz of water). I am also referring you to GI, please reach out to us  if do not hear from GI for appointment in next 2 weeks.   I am holding off on sending prescription for Ozempic  as you are having abdominal pain on it. I will add metformin  500 mg once daily, take it with food for diabetes control.    You can use following website for community resources:  [email or web address]

## 2024-06-12 NOTE — Assessment & Plan Note (Signed)
 History of colon polyps and positive FOBT in the past.  Recommend GI referral.

## 2024-06-12 NOTE — Assessment & Plan Note (Signed)
 Patient is currently taking Wellbutrin 300 mg daily which has been prescribed by his neurologist.  He is not interested in behavioral intervention at this time.  No SI, HI, is motivated to go to law school. Continue follow-up with neurologist.

## 2024-06-12 NOTE — Assessment & Plan Note (Addendum)
 This is chronic with complications including diabetic retinopathy based upon diabetic eye exam from 05/2022, peripheral neuropathy.  He is noticing  of significant GI symptoms even on low-dose of Ozempic .  Recommend repeating A1c, urine microalbumin today.  Start metformin  500 mg once daily with food.  Recommend follow-up in 6 months.  Consider adding ARB to patient's regimen if microalbuminuria present.  Diabetic foot exam not done today as he is established with podiatrist follows up with them on a regular basis.  Patient was also counseled on updating second dose of shingles and pneumonia vaccine.  He will bring his updated vaccine record during next visit.  Patient was also provided on community resources ([email or web address]-[email or web address]), referral to Child psychotherapist.

## 2024-06-12 NOTE — Assessment & Plan Note (Addendum)
 Chronic, with complication including wheelchair bound. Managed by [facility] neurologist Dr. Loreli, continue follow-up. We discussed home health, continuing exercises that is tolerable and safe for the patient.  He is not interested in home health at this time.  We have referral for social worker made as patient suffers from disability due to neurological condition.

## 2024-06-12 NOTE — Assessment & Plan Note (Signed)
 Chronic, continue amlodipine  10 mg daily, hydrochlorothiazide  12.5 mg daily.  If urine microalbuminuria will add telmisartan  to patient's regimen.

## 2024-06-12 NOTE — Assessment & Plan Note (Signed)
 Chronic and symptoms stable with as needed Flonase  use.  Refill on nasal Flonase , use 1 puff in each nostril daily as needed was sent to the pharmacy.

## 2024-06-12 NOTE — Assessment & Plan Note (Signed)
 Patient has been tolerating atorvastatin  40 mg daily, continue.  Check lipid panel.  He did eat bacon this morning. Recommend incorporating Mediterranean like diet, printed information provided to patient.

## 2024-06-13 ENCOUNTER — Ambulatory Visit: Payer: Self-pay

## 2024-06-13 DIAGNOSIS — E1129 Type 2 diabetes mellitus with other diabetic kidney complication: Secondary | ICD-10-CM | POA: Insufficient documentation

## 2024-06-13 MED ORDER — TELMISARTAN 20 MG PO TABS: 20.0000 mg | ORAL_TABLET | Freq: Every day | ORAL | 1 refills | Status: DC

## 2024-06-13 NOTE — Progress Notes (Signed)
 Please let the patient know I reviewed his lab results and have the following recommendations: - He continues to have elevated liver enzymes.  It is really important for him to be evaluated by gastroenterologist for further evaluation.  GI referral was made during his appointment.  If he does not hear from GI for appointment within the next couple of weeks have him reach out to our office.  - Cholesterol, A1c is stable.  However, urine is positive for presence of microalbumin which is suggestive of diabetic kidney disease.  I recommend adding medication called telmisartan , 20 mg to help protect kidney function.  Recommend repeating labs during his next visit.  Thank you,  Luke Shade, MD

## 2024-06-17 NOTE — Telephone Encounter (Signed)
   1. Diabetes mellitus with microalbuminuria (HCC) (Primary) - Telmisartan  20 mg sent to the pharmacy on 06/13/24.  - Urine Microalbumin w/creat. ratio; Future  Luke Shade, MD

## 2024-06-18 ENCOUNTER — Telehealth: Payer: Self-pay

## 2024-06-18 NOTE — Progress Notes (Signed)
 Complex Care Management Note  Care Guide Note 06/18/2024 Name: Michael Montgomery MRN: [id number] DOB: 1961-09-13  Michael Montgomery is a 63 y.o. year old male who sees Bair, Kalpana, MD for primary care. I reached out to Michael Montgomery by phone today to offer complex care management services.  Mr. Montgomery was given information about Complex Care Management services today including:   The Complex Care Management services include support from the care team which includes your Nurse Care Manager, Clinical Social Worker, or Pharmacist.  The Complex Care Management team is here to help remove barriers to the health concerns and goals most important to you. Complex Care Management services are voluntary, and the patient may decline or stop services at any time by request to their care team member.   Complex Care Management Consent Status: Patient agreed to services and verbal consent obtained.   Follow up plan:  Telephone appointment with complex care management team member scheduled for:  07/02/24 at 3:00 p.m.   Encounter Outcome:  Patient Scheduled  Dreama [facility]  [facility], [facility] VBCI Assistant Direct Dial: [phone]  Fax: [phone]

## 2024-07-02 ENCOUNTER — Other Ambulatory Visit: Payer: Self-pay | Admitting: *Deleted

## 2024-07-02 DIAGNOSIS — E1129 Type 2 diabetes mellitus with other diabetic kidney complication: Secondary | ICD-10-CM

## 2024-07-03 NOTE — Patient Instructions (Signed)
 Visit Information  Thank you for taking time to visit with me today. Please don't hesitate to contact me if I can be of assistance to you before our next scheduled appointment.  Our next appointment is by telephone on 07/18/24 at 1pm Please call the care guide team at [phone] if you need to cancel or reschedule your appointment.   Following is a copy of your care plan:   Goals Addressed             This Visit's Progress    VBCI Social Work Care Plan       Problems:   Lacks knowledge of how to connect to food resources  CSW Clinical Goal(s):   Over the next 30 days the Patient will work with the careguide  to address needs related to food insecurity.  Interventions:  Social Determinants of Health in Patient with DMII: SDOH assessments completed: Depression  , Food Insecurity , Housing , Intimate Partner Violence, and Transportation Evaluation of current treatment plan related to unmet needs Patient declined declined having any housing, intimate partner , mental health or transportation needs.however is requesting food resources Referral to [facility] Guide for food resources  Patient Goals/Self-Care Activities:  Follow up with careguide for community food options.  Plan:   Telephone follow up appointment with care management team member scheduled for:  07/24/24        Please call the Suicide and Crisis Lifeline: 988 call the [place]  National Suicide Prevention Lifeline: (970)320-3462 or TTY: 540-214-5405 TTY 262-398-2305) to talk to a trained counselor call 1-800-273-TALK (toll free, 24 hour hotline) if you are experiencing a Mental Health or Behavioral Health Crisis or need someone to talk to.  Patient verbalizes understanding of instructions and care plan provided today and agrees to view in MyChart. Active MyChart status and patient understanding of how to access instructions and care plan via MyChart confirmed with patient.     Michael Cooter,  LCSW [place]  [facility], [facility] Licensed Clinical Social Worker  Direct Dial: [phone]

## 2024-07-04 ENCOUNTER — Telehealth: Payer: Self-pay

## 2024-07-04 DIAGNOSIS — G6181 Chronic inflammatory demyelinating polyneuritis: Secondary | ICD-10-CM | POA: Diagnosis not present

## 2024-07-04 NOTE — Progress Notes (Signed)
 Complex Care Management Note Care Guide Note  07/04/2024 Name: Michael Montgomery MRN: [id number] DOB: 06-05-1961   Complex Care Management Outreach Attempts: An unsuccessful telephone outreach was attempted today to offer the patient information about available complex care management services.  Follow Up Plan:  Additional outreach attempts will be made to offer the patient complex care management information and services.   Encounter Outcome:  No Answer  Courtenay Hirth [facility]  [facility] Guide Direct Dial: [phone]  Fax: [phone] Website: [email or web address]

## 2024-07-05 DIAGNOSIS — G6181 Chronic inflammatory demyelinating polyneuritis: Secondary | ICD-10-CM | POA: Diagnosis not present

## 2024-07-06 DIAGNOSIS — G6181 Chronic inflammatory demyelinating polyneuritis: Secondary | ICD-10-CM | POA: Diagnosis not present

## 2024-07-07 DIAGNOSIS — G6181 Chronic inflammatory demyelinating polyneuritis: Secondary | ICD-10-CM | POA: Diagnosis not present

## 2024-07-08 ENCOUNTER — Telehealth: Payer: Self-pay

## 2024-07-08 NOTE — Progress Notes (Signed)
 Complex Care Management Note Care Guide Note  07/08/2024 Name: Michael Montgomery MRN: [id number] DOB: 03-Feb-1961  Michael Montgomery is a 63 y.o. year old male who is a primary care patient of Bair, Kalpana, MD . The community resource team was consulted for assistance with Food Insecurity and Financial Difficulties related to utilities.  SDOH screenings and interventions completed:  Yes  Social Drivers of Health From This Encounter   Food Insecurity: Food Insecurity Present (07/08/2024)   Hunger Vital Sign    Worried About Running Out of Food in the Last Year: Sometimes true    Ran Out of Food in the Last Year: Sometimes true  Housing: Low Risk  (07/08/2024)   Housing Stability Vital Sign    Unable to Pay for Housing in the Last Year: No    Number of Times Moved in the Last Year: 0    Homeless in the Last Year: No  Financial Resource Strain: Medium Risk (07/08/2024)   Overall Financial Resource Strain (CARDIA)    Difficulty of Paying Living Expenses: Somewhat hard  Transportation Needs: No Transportation Needs (07/08/2024)   PRAPARE - Administrator, Civil Service (Medical): No    Lack of Transportation (Non-Medical): No  Utilities: At Risk (07/08/2024)   Utilities    Threatened with loss of utilities: Yes    SDOH Interventions Today    Flowsheet Row Most Recent Value  SDOH Interventions   Food Insecurity Interventions [employer] Referral, Other (Comment)  [Emailed [place] [place] [employer] via FindHelp.]  Utilities Interventions [employer] Referral, Other (Comment)  [Emailed resources via FindHelp.]     Care guide performed the following interventions: Patient provided with information about care guide support team and interviewed to confirm resource needs.  Follow Up Plan:  Client will call if he does not receive information.  Encounter Outcome:  Patient Visit Completed  Leroy Trim [facility]  [facility] Guide Direct Dial: [phone]  Fax: [phone] Website: [email or web address]

## 2024-07-16 DIAGNOSIS — Z961 Presence of intraocular lens: Secondary | ICD-10-CM | POA: Diagnosis not present

## 2024-07-16 LAB — OPHTHALMOLOGY REPORT-SCANNED

## 2024-07-18 ENCOUNTER — Other Ambulatory Visit: Payer: Self-pay | Admitting: *Deleted

## 2024-07-18 NOTE — Patient Outreach (Signed)
 Complex Care Management   Visit Note  07/18/2024 late entry  Name:  Michael Michael MRN: [id number] DOB: 1961/06/08  Situation: Referral received for Complex Care Management related to Mental/Behavioral Health diagnosis depression, financial strain  and food insecurity I obtained verbal consent from Patient.  Visit completed with Patient  on the phone on 07/04/24  Background:   Past Medical History:  Diagnosis Date   Cataract march, april   bilateral removed    Chickenpox    Diabetes mellitus without complication (HCC)    metformin     Diabetic amyotrophy (HCC)    sees dr at [facility]   Gastric ulcer    Heart murmur    When he was younger, no recent issues   High blood pressure    controlled with meds   Hypercholesteremia    controlled with medication   Knee injury    left   Motion sickness    boats   Neuropathy     Assessment: Patient Reported Symptoms:  Cognitive Cognitive Status: Able to follow simple commands, Alert and oriented to person, place, and time, Normal speech and language skills Cognitive/Intellectual Conditions Management [RPT]: None reported or documented in medical history or problem list   Healing Pattern: Average Health Facilitated by: Stress management, Healthy diet  Neurological Neurological Review of Symptoms: No symptoms reported    HEENT HEENT Symptoms Reported: No symptoms reported      Cardiovascular Cardiovascular Symptoms Reported: No symptoms reported    Respiratory Respiratory Symptoms Reported: No symptoms reported    Endocrine Endocrine Symptoms Reported: No symptoms reported Is patient diabetic?: Yes Is patient checking blood sugars at home?: Yes List most recent blood sugar readings, include date and time of day: 105, 07/02/24 7am Endocrine Self-Management Outcome: 4 (good)  Gastrointestinal Gastrointestinal Symptoms Reported: Constipation Additional Gastrointestinal Details: Miralax  used daily Gastrointestinal Management Strategies:  Medication therapy, Incontinence garment/pad    Genitourinary Genitourinary Symptoms Reported: No symptoms reported    Integumentary Integumentary Symptoms Reported: No symptoms reported    Musculoskeletal Musculoskelatal Symptoms Reviewed: Weakness Additional Musculoskeletal Details: Wheelchair bound Musculoskeletal Management Strategies: Coping strategies, Medication therapy Musculoskeletal Comment: 2017 diagnosed with a nerve and muscle disorder Falls in the past year?: No Number of falls in past year: 1 or less Was there an injury with Fall?: No Fall Risk Category Calculator: 0 Patient Fall Risk Level: Low Fall Risk Patient at Risk for Falls Due to: No Fall Risks  Psychosocial Additional Psychological Details: some depression at times due to weakness in legs-denies need for a therapist-reports having transportation, would like resources for food Behavioral Management Strategies: Coping strategies Major Change/Loss/Stressor/Fears (CP): Medical condition, self Behaviors When Feeling Stressed/Fearful: I am good I have a daily routine, make sure I get up and do things loves art, music in school for criminal justice Techniques to [employer] with Loss/Stress/Change: Diversional activities Quality of Family Relationships: supportive Do you feel physically threatened by others?: No    07/18/2024    PHQ2-9 Depression Screening   Little interest or pleasure in doing things Not at all  Feeling down, depressed, or hopeless Not at all  PHQ-2 - Total Score 0  Trouble falling or staying asleep, or sleeping too much    Feeling tired or having little energy    Poor appetite or overeating     Feeling bad about yourself - or that you are a failure or have let yourself or your family down    Trouble concentrating on things, such as reading the newspaper or  watching television    Moving or speaking so slowly that other people could have noticed.  Or the opposite - being so fidgety or restless that you  have been moving around a lot more than usual    Thoughts that you would be better off dead, or hurting yourself in some way    PHQ2-9 Total Score    If you checked off any problems, how difficult have these problems made it for you to do your work, take care of things at home, or get along with other people    Depression Interventions/Treatment      There were no vitals filed for this visit.  Medications Reviewed Today     Reviewed by Ermalinda Lenn HERO, LCSW (Social Worker) on 07/02/24 at 1523  Med List Status: <None>   Medication Order Taking? Sig Documenting Provider Last Dose Status Informant  Accu-Chek Softclix Lancets lancets [id number] Yes 1 each by Other route daily. Check sugar fasting daily. Dx Code E11.9. Maribeth Camellia MATSU, MD  Active   amLODipine  (NORVASC ) 10 MG tablet [id number] Yes Take 1 tablet (10 mg total) by mouth daily. Abbey Bruckner, MD  Active   aspirin  EC 81 MG tablet [id number] Yes Take 81 mg by mouth daily. Swallow whole. [provider]  Active Self  atorvastatin  (LIPITOR) 40 MG tablet [id number] Yes Take 1 tablet (40 mg total) by mouth daily. Abbey Bruckner, MD  Active   Blood Glucose Monitoring Suppl (ACCU-CHEK GUIDE ME) w/Device PRESSLEY [id number] Yes USE ONCE DAILY FASTING. DX CODE E11.9. Maribeth Camellia MATSU, MD  Active   buPROPion (WELLBUTRIN XL) 300 MG 24 hr tablet [id number] Yes Take by mouth. [provider]  Active   fluticasone  (FLONASE ) 50 MCG/ACT nasal spray [id number] Yes Place 1 spray into both nostrils daily as needed for allergies or rhinitis. Abbey Bruckner, MD  Active   GAMUNEX-C 20 GM/200ML SOLN [id number] Yes  [provider]  Active   glucose blood (ACCU-CHEK GUIDE TEST) test strip [id number] Yes USE ONCE DAILY FASTING Marylynn Verneita CROME, MD  Active   hydrochlorothiazide  (MICROZIDE ) 12.5 MG capsule [id number] Yes Take 1 capsule (12.5 mg total) by mouth daily. MUST KEEP APPT FOR FURTHER REFILLS Bair, Kalpana, MD  Active   metFORMIN   (GLUCOPHAGE -XR) 500 MG 24 hr tablet [id number] Yes Take 1 tablet (500 mg total) by mouth daily with breakfast. Bair, Kalpana, MD  Active   Multiple Vitamin (MULTIVITAMIN) capsule [id number] Yes Take 1 capsule by mouth daily. [provider]  Active Self  Omega-3 Fatty Acids (FISH OIL PO) [id number] Yes Take 1 capsule by mouth daily.  [provider]  Active Self  Semaglutide ,0.25 or 0.5MG /DOS, (OZEMPIC , 0.25 OR 0.5 MG/DOSE,) 2 MG/3ML SOPN [id number] Yes Inject 0.25 mg into the skin once a week. Narendra, Nischal, MD  Active   sodium chloride  0.9 % infusion [id number] Yes Inject into the vein. [provider]  Active Self  telmisartan  (MICARDIS ) 20 MG tablet [id number] Yes Take 1 tablet (20 mg total) by mouth daily. Abbey Bruckner, MD  Active   Med List Note Odelia Kate RAMAN, [place] 10/31/19 1051):              Recommendation:   PCP Follow-up Continue Current Plan of Care  Follow Up Plan:   Telephone follow-up 07/18/24  Lenn Ermalinda, LCSW [place]  [facility], [facility] Licensed Clinical Social Worker  Direct Dial: [phone]

## 2024-07-19 NOTE — Patient Outreach (Signed)
 Complex Care Management   Visit Note  07/19/2024  Name:  Michael Montgomery MRN: [id number] DOB: 10-27-1960  Situation: Referral received for Complex Care Management related to Mental/Behavioral Health diagnosis depression, financial strain  and food insecurity I obtained verbal consent from Patient.  Visit completed with Patient  on the phone on 07/19/24    Background:   Past Medical History:  Diagnosis Date   Cataract march, april   bilateral removed    Chickenpox    Diabetes mellitus without complication (HCC)    metformin     Diabetic amyotrophy (HCC)    sees dr at [facility]   Gastric ulcer    Heart murmur    When he was younger, no recent issues   High blood pressure    controlled with meds   Hypercholesteremia    controlled with medication   Knee injury    left   Motion sickness    boats   Neuropathy     Assessment: Patient Reported Symptoms:  Cognitive Cognitive Status: Able to follow simple commands, Alert and oriented to person, place, and time, Insightful and able to interpret abstract concepts Cognitive/Intellectual Conditions Management [RPT]: None reported or documented in medical history or problem list   Health Maintenance Behaviors: Annual physical exam Healing Pattern: Average Health Facilitated by: Stress management  Neurological Neurological Review of Symptoms: No symptoms reported    HEENT HEENT Symptoms Reported: No symptoms reported      Cardiovascular Cardiovascular Symptoms Reported: No symptoms reported    Respiratory Respiratory Symptoms Reported: No symptoms reported    Endocrine Endocrine Symptoms Reported: No symptoms reported Is patient diabetic?: Yes Is patient checking blood sugars at home?: Yes List most recent blood sugar readings, include date and time of day: 130, 07/18/24 after a meal Endocrine Self-Management Outcome: 4 (good)  Gastrointestinal Gastrointestinal Symptoms Reported: Constipation Additional Gastrointestinal Details:  Mirlax used daily Gastrointestinal Management Strategies: Incontinence garment/pad, Medication therapy    Genitourinary Genitourinary Symptoms Reported: No symptoms reported    Integumentary Integumentary Symptoms Reported: No symptoms reported    Musculoskeletal Musculoskelatal Symptoms Reviewed: Weakness Additional Musculoskeletal Details: remains wheelchari bound Musculoskeletal Management Strategies: Coping strategies, Medication therapy      Psychosocial Psychosocial Symptoms Reported: No symptoms reported          07/19/2024    PHQ2-9 Depression Screening   Little interest or pleasure in doing things    Feeling down, depressed, or hopeless    PHQ-2 - Total Score    Trouble falling or staying asleep, or sleeping too much    Feeling tired or having little energy    Poor appetite or overeating     Feeling bad about yourself - or that you are a failure or have let yourself or your family down    Trouble concentrating on things, such as reading the newspaper or watching television    Moving or speaking so slowly that other people could have noticed.  Or the opposite - being so fidgety or restless that you have been moving around a lot more than usual    Thoughts that you would be better off dead, or hurting yourself in some way    PHQ2-9 Total Score    If you checked off any problems, how difficult have these problems made it for you to do your work, take care of things at home, or get along with other people    Depression Interventions/Treatment      There were no vitals filed for this visit.  Medications Reviewed Today     Reviewed by Ermalinda Lenn HERO, LCSW (Social Worker) on 07/19/24 at 1305  Med List Status: <None>   Medication Order Taking? Sig Documenting Provider Last Dose Status Informant  Accu-Chek Softclix Lancets lancets [id number]  1 each by Other route daily. Check sugar fasting daily. Dx Code E11.9. Maribeth Camellia MATSU, MD  Active   amLODipine  (NORVASC ) 10 MG  tablet [id number]  Take 1 tablet (10 mg total) by mouth daily. Bair, Luke, MD  Active   aspirin  EC 81 MG tablet [id number]  Take 81 mg by mouth daily. Swallow whole. [provider]  Active Self  atorvastatin  (LIPITOR) 40 MG tablet [id number]  Take 1 tablet (40 mg total) by mouth daily. Abbey Luke, MD  Active   Blood Glucose Monitoring Suppl (ACCU-CHEK GUIDE ME) w/Device KIT [id number]  USE ONCE DAILY FASTING. DX CODE E11.9. Maribeth Camellia MATSU, MD  Active   buPROPion (WELLBUTRIN XL) 300 MG 24 hr tablet [id number]  Take by mouth. [provider]  Expired 07/02/24 2359   fluticasone  (FLONASE ) 50 MCG/ACT nasal spray [id number]  Place 1 spray into both nostrils daily as needed for allergies or rhinitis. Abbey Luke, MD  Active   GAMUNEX-C 20 GM/200ML SOLN [id number]   [provider]  Active   glucose blood (ACCU-CHEK GUIDE TEST) test strip [id number]  USE ONCE DAILY FASTING Marylynn Verneita CROME, MD  Active   hydrochlorothiazide  (MICROZIDE ) 12.5 MG capsule [id number]  Take 1 capsule (12.5 mg total) by mouth daily. MUST KEEP APPT FOR FURTHER REFILLS Bair, Kalpana, MD  Active   metFORMIN  (GLUCOPHAGE -XR) 500 MG 24 hr tablet [id number]  Take 1 tablet (500 mg total) by mouth daily with breakfast. Bair, Luke, MD  Active   Multiple Vitamin (MULTIVITAMIN) capsule [id number]  Take 1 capsule by mouth daily. [provider]  Active Self  Omega-3 Fatty Acids (FISH OIL PO) [id number]  Take 1 capsule by mouth daily.  [provider]  Active Self  Semaglutide ,0.25 or 0.5MG /DOS, (OZEMPIC , 0.25 OR 0.5 MG/DOSE,) 2 MG/3ML SOPN [id number]  Inject 0.25 mg into the skin once a week. Narendra, Nischal, MD  Active   sodium chloride  0.9 % infusion [id number]  Inject into the vein. [provider]  Active Self  telmisartan  (MICARDIS ) 20 MG tablet [id number]  Take 1 tablet (20 mg total) by mouth daily. Abbey Luke, MD  Active   Med List Note Odelia Kate RAMAN, [place] 10/31/19 1051):               Recommendation:   PCP Follow-up Continue Current Plan of Care  Follow Up Plan:   Telephone follow up appointment date/time:  08/05/24 9:30am  Harding Thomure Ermalinda HUGHS [place]  [facility], [facility] Licensed Clinical Social Worker  Direct Dial: [phone]

## 2024-07-19 NOTE — Patient Instructions (Signed)
 Visit Information  Thank you for taking time to visit with me today. Please don't hesitate to contact me if I can be of assistance to you before our next scheduled appointment.  Your next care management appointment is by telephone on 08/05/24 at 9am   Please call the care guide team at [phone] if you need to cancel, schedule, or reschedule an appointment.   Please call the Suicide and Crisis Lifeline: 988 call the [place]  National Suicide Prevention Lifeline: 714 213 9403 or TTY: 6412064908 TTY [phone]) to talk to a trained counselor call 1-800-273-TALK (toll free, 24 hour hotline) if you are experiencing a Mental Health or Behavioral Health Crisis or need someone to talk to.   Laticha Ferrucci, LCSW [place]  [facility], [facility] Licensed Clinical Social Worker  Direct Dial: [phone]

## 2024-07-22 ENCOUNTER — Telehealth: Payer: Self-pay

## 2024-07-22 NOTE — Progress Notes (Signed)
 Complex Care Management Note Care Guide Note  07/22/2024 Name: ARIZ TERRONES MRN: [id number] DOB: 1961-04-23  Lynwood JAYSON Arkin is a 63 y.o. year old male who is a primary care patient of Bair, Kalpana, MD . The community resource team was consulted for assistance with Food Insecurity and Financial Difficulties related to utilities.  SDOH screenings and interventions completed:  Yes     SDOH Interventions Today    Flowsheet Row Most Recent Value  SDOH Interventions   Food Insecurity Interventions Other (Comment)  [Verified email address resent food pantry list. Patient confirmed he reseived information.]  Utilities Interventions Other (Comment)  [Verified email address resent utility assistance information. Patient confirmed he received information.]     Care guide performed the following interventions: Follow up call placed to the patient to discuss status of referral.  Follow Up Plan:  No further follow up planned at this time. The patient has been provided with needed resources.  Encounter Outcome:  Patient Visit Completed  Jora Galluzzo [facility]  [facility] Guide Direct Dial: [phone]  Fax: [phone] Website: [email or web address]

## 2024-07-26 ENCOUNTER — Ambulatory Visit: Admitting: Podiatry

## 2024-07-29 ENCOUNTER — Ambulatory Visit (INDEPENDENT_AMBULATORY_CARE_PROVIDER_SITE_OTHER): Admitting: Podiatry

## 2024-07-29 DIAGNOSIS — Z91199 Patient's noncompliance with other medical treatment and regimen due to unspecified reason: Secondary | ICD-10-CM

## 2024-07-29 NOTE — Progress Notes (Signed)
 1. No-show for appointment

## 2024-07-31 ENCOUNTER — Telehealth: Payer: Self-pay

## 2024-07-31 NOTE — Telephone Encounter (Signed)
 Copied from CRM [id number]. Topic: Clinical - Medical Advice >> Jul 31, 2024 10:53 AM Terri MATSU wrote: Reason for CRM: Patient wants to know if Dr.Bair could refer or order him a shower wheelchair so he can have more independence. He already spoke to his insurance but they stated his doctor would have to order it for him. Callback number [phone]

## 2024-08-01 NOTE — Telephone Encounter (Signed)
 Please let Ricardo know since we have not dicussed this in the past we can talk about this during his upcoming appointment in 09/11/24. I also recommend patient discuss this with his appointment with neurologist on 08/07/24 since his functional limitations is secondary to neurological condition. If neurology is not able to help we can certainly help him but will need office visit.  Thank you,  Luke Shade, MD

## 2024-08-05 ENCOUNTER — Other Ambulatory Visit: Payer: Self-pay | Admitting: *Deleted

## 2024-08-05 NOTE — Patient Outreach (Signed)
 Complex Care Management   Visit Note  08/05/2024  Name:  Michael Montgomery MRN: [id number] DOB: Aug 29, 1961  Situation: Referral received for Complex Care Management related to Mental/Behavioral Health diagnosis depression, financial strain and food insecurity I obtained verbal consent from Patient. Visit completed with Patient on the phone  Background:   Past Medical History:  Diagnosis Date   Cataract march, april   bilateral removed    Chickenpox    Diabetes mellitus without complication (HCC)    metformin     Diabetic amyotrophy (HCC)    sees dr at [facility]   Gastric ulcer    Heart murmur    When he was younger, no recent issues   High blood pressure    controlled with meds   Hypercholesteremia    controlled with medication   Knee injury    left   Motion sickness    boats   Neuropathy     Assessment: Patient Reported Symptoms:  Cognitive Cognitive Status: Alert and oriented to person, place, and time, Able to follow simple commands, Insightful and able to interpret abstract concepts, Incoherent or nonsensical speech Cognitive/Intellectual Conditions Management [RPT]: None reported or documented in medical history or problem list      Neurological Neurological Review of Symptoms: No symptoms reported Neurological Comment: Active with Neurologist-Dr.Shau  HEENT HEENT Symptoms Reported: No symptoms reported      Cardiovascular Cardiovascular Symptoms Reported: No symptoms reported    Respiratory Respiratory Symptoms Reported: No symptoms reported    Endocrine Endocrine Symptoms Reported: No symptoms reported Is patient diabetic?: Yes Is patient checking blood sugars at home?: Yes List most recent blood sugar readings, include date and time of day: 125, 08/05/24 8:30am Endocrine Self-Management Outcome: 4 (good)  Gastrointestinal Gastrointestinal Symptoms Reported: Constipation Additional Gastrointestinal Details: Uses Mirlax daily Gastrointestinal Management  Strategies: Incontinence garment/pad, Medication therapy    Genitourinary Genitourinary Symptoms Reported: No symptoms reported    Integumentary Integumentary Symptoms Reported: No symptoms reported    Musculoskeletal Musculoskelatal Symptoms Reviewed: Weakness Additional Musculoskeletal Details: remains wheelchair bound Musculoskeletal Management Strategies: Coping strategies, Medication therapy      Psychosocial Psychosocial Symptoms Reported: No symptoms reported Additional Psychological Details: reports having enough wellbutrin to last until  appt with Primary-denies depressed mood understands the benefits of getting up and remaining as active as possible-has in home aids 6 days a week, 3 hours per day Behavioral Management Strategies: Coping strategies, Medication therapy Major Change/Loss/Stressor/Fears (CP): Medical condition, self Techniques to Cope with Loss/Stress/Change: Diversional activities Quality of Family Relationships: involved, supportive, helpful Do you feel physically threatened by others?: No    08/05/2024    PHQ2-9 Depression Screening   Little interest or pleasure in doing things Not at all  Feeling down, depressed, or hopeless Not at all  PHQ-2 - Total Score 0  Trouble falling or staying asleep, or sleeping too much    Feeling tired or having little energy    Poor appetite or overeating     Feeling bad about yourself - or that you are a failure or have let yourself or your family down    Trouble concentrating on things, such as reading the newspaper or watching television    Moving or speaking so slowly that other people could have noticed.  Or the opposite - being so fidgety or restless that you have been moving around a lot more than usual    Thoughts that you would be better off dead, or hurting yourself in some way  PHQ2-9 Total Score    If you checked off any problems, how difficult have these problems made it for you to do your work, take care of  things at home, or get along with other people    Depression Interventions/Treatment      There were no vitals filed for this visit.  Medications Reviewed Today     Reviewed by Ermalinda Lenn HERO, LCSW (Social Worker) on 08/05/24 at [phone]  Med List Status: <None>   Medication Order Taking? Sig Documenting Provider Last Dose Status Informant  Accu-Chek Softclix Lancets lancets [id number] Yes 1 each by Other route daily. Check sugar fasting daily. Dx Code E11.9. Maribeth Camellia MATSU, MD  Active   amLODipine  (NORVASC ) 10 MG tablet [id number] Yes Take 1 tablet (10 mg total) by mouth daily. Abbey Bruckner, MD  Active   aspirin  EC 81 MG tablet [id number] Yes Take 81 mg by mouth daily. Swallow whole. [provider]  Active Self  atorvastatin  (LIPITOR) 40 MG tablet [id number] Yes Take 1 tablet (40 mg total) by mouth daily. Abbey Bruckner, MD  Active   Blood Glucose Monitoring Suppl (ACCU-CHEK GUIDE ME) w/Device PRESSLEY [id number] Yes USE ONCE DAILY FASTING. DX CODE E11.9. Maribeth Camellia MATSU, MD  Active   buPROPion (WELLBUTRIN XL) 300 MG 24 hr tablet [id number] Yes Take by mouth. [provider]  Active   fluticasone  (FLONASE ) 50 MCG/ACT nasal spray [id number] Yes Place 1 spray into both nostrils daily as needed for allergies or rhinitis. Abbey Bruckner, MD  Active   GAMUNEX-C 20 GM/200ML SOLN [id number] Yes  [provider]  Active   glucose blood (ACCU-CHEK GUIDE TEST) test strip [id number] Yes USE ONCE DAILY FASTING Marylynn Verneita CROME, MD  Active   hydrochlorothiazide  (MICROZIDE ) 12.5 MG capsule [id number] Yes Take 1 capsule (12.5 mg total) by mouth daily. MUST KEEP APPT FOR FURTHER REFILLS Bair, Kalpana, MD  Active   metFORMIN  (GLUCOPHAGE -XR) 500 MG 24 hr tablet [id number] Yes Take 1 tablet (500 mg total) by mouth daily with breakfast. Bair, Kalpana, MD  Active   Multiple Vitamin (MULTIVITAMIN) capsule [id number] Yes Take 1 capsule by mouth daily. [provider]  Active Self  Omega-3  Fatty Acids (FISH OIL PO) [id number] Yes Take 1 capsule by mouth daily.  [provider]  Active Self  Semaglutide ,0.25 or 0.5MG /DOS, (OZEMPIC , 0.25 OR 0.5 MG/DOSE,) 2 MG/3ML SOPN [id number] Yes Inject 0.25 mg into the skin once a week. Narendra, Nischal, MD  Active   sodium chloride  0.9 % infusion [id number] Yes Inject into the vein. [provider]  Active Self  telmisartan  (MICARDIS ) 20 MG tablet [id number] Yes Take 1 tablet (20 mg total) by mouth daily. Abbey Bruckner, MD  Active   Med List Note Odelia Kate RAMAN, [place] 10/31/19 1051):              Recommendation:   PCP Follow-up Specialty provider follow-up as scheduled  Follow Up Plan:   Closing From:  Complex Care Management  Kaston Faughn, LCSW [place]  [facility], [facility] Licensed Clinical Social Worker  Direct Dial: [phone]

## 2024-08-05 NOTE — Patient Instructions (Signed)
 Visit Information  Thank you for taking time to visit with me today. Please don't hesitate to contact me if I can be of assistance to you in the future  Your next care management appointment is no further scheduled appointments.     Please call the care guide team at [phone] if you need to cancel, schedule, or reschedule an appointment.   Please call the Suicide and Crisis Lifeline: 988 call the [place]  National Suicide Prevention Lifeline: 952-867-6219 or TTY: (606)242-6220 TTY 402-412-4736) to talk to a trained counselor call 1-800-273-TALK (toll free, 24 hour hotline) call 911 if you are experiencing a Mental Health or Behavioral Health Crisis or need someone to talk to.  Evania Lyne, LCSW [place]  [facility], [facility] Health Licensed Clinical Social Worker  Direct Dial: [phone]

## 2024-08-06 ENCOUNTER — Telehealth: Payer: Self-pay | Admitting: *Deleted

## 2024-08-06 ENCOUNTER — Ambulatory Visit: Payer: 59 | Admitting: *Deleted

## 2024-08-06 VITALS — BP 127/79 | Ht 71.0 in | Wt 148.0 lb

## 2024-08-06 DIAGNOSIS — Z0000 Encounter for general adult medical examination without abnormal findings: Secondary | ICD-10-CM | POA: Diagnosis not present

## 2024-08-06 NOTE — Telephone Encounter (Signed)
 Performed AWV While reviewing care gaps which shows colonoscopy patient stated that he is in a wheelchair is not able to do the prep for a colonoscopy . Patient stated that he is willing to do a test for colon cancer but is not able to do a colonoscopy.  It appears that patient was scheduled to see Dr. Ruel Kung before he left his practice but patient was not able to make appointments.   Patient stated that he has an upcoming appointment scheduled with you and can discuss it at that appointment with you.

## 2024-08-06 NOTE — Progress Notes (Signed)
 Subjective:   Michael Montgomery is a 63 y.o. who presents for a Medicare Wellness preventive visit.  As a reminder, Annual Wellness Visits don't include a physical exam, and some assessments may be limited, especially if this visit is performed virtually. We may recommend an in-person follow-up visit with your provider if needed.  Visit Complete: Virtual I connected with  Michael Montgomery on 08/06/24 by a audio enabled telemedicine application and verified that I am speaking with the correct person using two identifiers.  Patient Location: Home  Provider Location: Home Office  I discussed the limitations of evaluation and management by telemedicine. The patient expressed understanding and agreed to proceed.  Vital Signs: Because this visit was a virtual/telehealth visit, some criteria may be missing or patient reported. Any vitals not documented were not able to be obtained and vitals that have been documented are patient reported.  VideoDeclined- This patient declined Librarian, academic. Therefore the visit was completed with audio only.  Persons Participating in Visit: Patient.  AWV Questionnaire: No: Patient Medicare AWV questionnaire was not completed prior to this visit.  Cardiac Risk Factors include: advanced age (>63men, >28 women);diabetes mellitus;male gender;hypertension;dyslipidemia;sedentary lifestyle     Objective:    Today's Vitals   08/06/24 0811  BP: 127/79  Weight: 148 lb (67.1 kg)  Height: 5' 11 (1.803 m)   Body mass index is 20.64 kg/m.     08/06/2024    8:34 AM 07/02/2024    3:43 PM 08/01/2023    8:41 AM 07/29/2022    1:42 PM 12/23/2021   10:10 PM 12/23/2021   12:50 PM 07/14/2021   11:43 AM  Advanced Directives  Does Patient Have a Medical Advance Directive? No No No No No No Yes  Type of Tax inspector;Living will  Does patient want to make changes to medical advance directive?       No  - Patient declined  Copy of Healthcare Power of Attorney in Chart?       No - copy requested  Would patient like information on creating a medical advance directive? No - Patient declined No - Patient declined No - Patient declined No - Patient declined No - Patient declined      Current Medications (verified) Outpatient Encounter Medications as of 08/06/2024  Medication Sig   Accu-Chek Softclix Lancets lancets 1 each by Other route daily. Check sugar fasting daily. Dx Code E11.9.   amLODipine  (NORVASC ) 10 MG tablet Take 1 tablet (10 mg total) by mouth daily.   aspirin  EC 81 MG tablet Take 81 mg by mouth daily. Swallow whole.   atorvastatin  (LIPITOR) 40 MG tablet Take 1 tablet (40 mg total) by mouth daily.   Blood Glucose Monitoring Suppl (ACCU-CHEK GUIDE ME) w/Device KIT USE ONCE DAILY FASTING. DX CODE E11.9.   buPROPion (WELLBUTRIN XL) 300 MG 24 hr tablet Take by mouth.   fluticasone  (FLONASE ) 50 MCG/ACT nasal spray Place 1 spray into both nostrils daily as needed for allergies or rhinitis.   GAMUNEX-C 20 GM/200ML SOLN    glucose blood (ACCU-CHEK GUIDE TEST) test strip USE ONCE DAILY FASTING   hydrochlorothiazide  (MICROZIDE ) 12.5 MG capsule Take 1 capsule (12.5 mg total) by mouth daily. MUST KEEP APPT FOR FURTHER REFILLS   metFORMIN  (GLUCOPHAGE -XR) 500 MG 24 hr tablet Take 1 tablet (500 mg total) by mouth daily with breakfast.   Multiple Vitamin (MULTIVITAMIN) capsule Take 1 capsule by mouth daily.  Omega-3 Fatty Acids (FISH OIL PO) Take 1 capsule by mouth daily.    sodium chloride  0.9 % infusion Inject into the vein.   Semaglutide ,0.25 or 0.5MG /DOS, (OZEMPIC , 0.25 OR 0.5 MG/DOSE,) 2 MG/3ML SOPN Inject 0.25 mg into the skin once a week. (Patient not taking: Reported on 08/06/2024)   telmisartan  (MICARDIS ) 20 MG tablet Take 1 tablet (20 mg total) by mouth daily. (Patient not taking: Reported on 08/06/2024)   No facility-administered encounter medications on file as of 08/06/2024.     Allergies (verified) Patient has no known allergies.   History: Past Medical History:  Diagnosis Date   Cataract march, april   bilateral removed    Chickenpox    Diabetes mellitus without complication (HCC)    metformin     Diabetic amyotrophy (HCC)    sees dr at [facility]   Gastric ulcer    Heart murmur    When he was younger, no recent issues   High blood pressure    controlled with meds   Hypercholesteremia    controlled with medication   Knee injury    left   Motion sickness    boats   Neuropathy    Past Surgical History:  Procedure Laterality Date   CATARACT EXTRACTION W/PHACO Right 02/01/2016   Procedure: CATARACT EXTRACTION PHACO AND INTRAOCULAR LENS PLACEMENT (IOC) right;  Surgeon: Donzell Arlyce Budd, MD;  Location: [facility];  Service: Ophthalmology;  Laterality: Right;  DIABETIC - oral meds   CATARACT EXTRACTION W/PHACO Left 03/07/2016   Procedure: CATARACT EXTRACTION PHACO AND INTRAOCULAR LENS PLACEMENT (IOC);  Surgeon: Donzell Arlyce Budd, MD;  Location: [facility];  Service: Ophthalmology;  Laterality: Left;  DIABETIC - oral meds    circumscision     FOOT SURGERY Right 05/2023   FOOT SURGERY Bilateral    Right on 01/2024 and left on 04/2024 for b/l hammer toe deformity   Family History  Problem Relation Age of Onset   Alcoholism Father    Hyperlipidemia Father    Hypertension Father    Throat cancer Father        smoked but had stopped 20 yeras before dx   Diabetes Mother    Emphysema Mother    Kidney disease Neg Hx    Prostate cancer Neg Hx    Colon cancer Neg Hx    Colon polyps Neg Hx    Esophageal cancer Neg Hx    Rectal cancer Neg Hx    Stomach cancer Neg Hx    Kidney cancer Neg Hx    Bladder Cancer Neg Hx    Social History   Socioeconomic History   Marital status: Married    Spouse name: Irven Ingalsbe   Number of children: 2   Years of education: 12   Highest education level: 12th grade   Occupational History   Occupation: Disabled  Tobacco Use   Smoking status: Former    Types: Cigars   Smokeless tobacco: Never   Tobacco comments:    one cigar once a month to once every 2 months   Vaping Use   Vaping status: Never Used  Substance and Sexual Activity   Alcohol  use: Yes    Alcohol /week: 5.0 standard drinks of alcohol     Types: 5 Shots of liquor per week    Comment: pt stated  every once in a while   Drug use: No   Sexual activity: Not Currently  Other Topics Concern   Not on file  Social History Narrative  Married   Social Drivers of Corporate investment banker Strain: Low Risk  (08/06/2024)   Overall Financial Resource Strain (CARDIA)    Difficulty of Paying Living Expenses: Not hard at all  Recent Concern: Financial Resource Strain - Medium Risk (07/08/2024)   Overall Financial Resource Strain (CARDIA)    Difficulty of Paying Living Expenses: Somewhat hard  Food Insecurity: Food Insecurity Present (08/06/2024)   Hunger Vital Sign    Worried About Running Out of Food in the Last Year: Sometimes true    Ran Out of Food in the Last Year: Sometimes true  Transportation Needs: No Transportation Needs (08/06/2024)   PRAPARE - Administrator, Civil Service (Medical): No    Lack of Transportation (Non-Medical): No  Physical Activity: Inactive (08/06/2024)   Exercise Vital Sign    Days of Exercise per Week: 0 days    Minutes of Exercise per Session: 0 min  Stress: No Stress Concern Present (08/06/2024)   [employer] of Occupational Health - Occupational Stress Questionnaire    Feeling of Stress: Not at all  Social Connections: Moderately Isolated (08/06/2024)   Social Connection and Isolation Panel    Frequency of Communication with Friends and Family: More than three times a week    Frequency of Social Gatherings with Friends and Family: Never    Attends Religious Services: Never    Database administrator or Organizations: No    Attends  Engineer, structural: Never    Marital Status: Married    Tobacco Counseling Counseling given: Not Answered Tobacco comments: one cigar once a month to once every 2 months     Clinical Intake:  Pre-visit preparation completed: Yes  Pain : No/denies pain     BMI - recorded: 20.64 Nutritional Status: BMI of 19-24  Normal Nutritional Risks: None Diabetes: Yes CBG done?: Yes (FBS 125 per patient)  Lab Results  Component Value Date   HGBA1C 5.6 06/12/2024   HGBA1C 4.8 02/19/2024   HGBA1C 5.0 12/06/2022     How often do you need to have someone help you when you read instructions, pamphlets, or other written materials from your doctor or pharmacy?: 1 - Never  Interpreter Needed?: No  Information entered by :: R. Klayten Jolliff LPN   Activities of Daily Living     08/06/2024    8:49 AM 08/06/2024    8:33 AM  In your present state of health, do you have any difficulty performing the following activities:  Hearing? 0   Vision? 0   Difficulty concentrating or making decisions? 0   Walking or climbing stairs? 1   Dressing or bathing? 1 1  Doing errands, shopping? 1   Preparing Food and eating ? N   Using the Toilet? N   In the past six months, have you accidently leaked urine? N   Do you have problems with loss of bowel control? N   Managing your Medications? N   Managing your Finances? N   Housekeeping or managing your Housekeeping? Y     Patient Care Team: Bair, Kalpana, MD as PCP - General (Family Medicine) Therisa Bi, MD as Consulting Physician (Gastroenterology)  I have updated your Care Teams any recent Medical Services you may have received from other providers in the past year.     Assessment:   This is a routine wellness examination for Michael Montgomery.  Hearing/Vision screen Hearing Screening - Comments:: No issues Vision Screening - Comments:: readers   Goals Addressed  This Visit's Progress    Patient Stated       Wants to join gym  and get some physical therapy        Depression Screen     08/06/2024    8:25 AM 08/05/2024    9:46 AM 07/02/2024    3:28 PM 06/12/2024   11:07 AM 02/19/2024   11:45 AM 08/01/2023    8:34 AM 02/27/2023    3:02 PM  PHQ 2/9 Scores  PHQ - 2 Score 0 0 0 2 2 1  0  PHQ- 9 Score 3   7 6 3  0    Fall Risk     08/06/2024    8:17 AM 07/02/2024    3:37 PM 06/12/2024   11:07 AM 08/01/2023    8:26 AM 02/27/2023    3:02 PM  Fall Risk   Falls in the past year? 0 0 0 0 0  Number falls in past yr: 0 0 0 0 0  Injury with Fall? 0 0 0 0 0  Risk for fall due to : No Fall Risks No Fall Risks No Fall Risks No Fall Risks No Fall Risks  Follow up Falls evaluation completed;Falls prevention discussed  Falls evaluation completed Falls prevention discussed;Falls evaluation completed Falls evaluation completed    MEDICARE RISK AT HOME:  Medicare Risk at Home Any stairs in or around the home?: Yes If so, are there any without handrails?: Yes (has a ramp) Home free of loose throw rugs in walkways, pet beds, electrical cords, etc?: Yes Adequate lighting in your home to reduce risk of falls?: Yes Life alert?: Yes Use of a cane, walker or w/c?: Yes (wheelchair) Grab bars in the bathroom?: Yes Shower chair or bench in shower?: Yes Elevated toilet seat or a handicapped toilet?: Yes  TIMED UP AND GO:  Was the test performed?  No  Cognitive Function: 6CIT completed        08/06/2024    8:36 AM 08/01/2023    8:42 AM 07/29/2022    1:54 PM 07/11/2019   11:24 AM  6CIT Screen  What Year? 0 points 0 points 0 points 0 points  What month? 0 points 0 points 0 points 0 points  What time? 0 points 0 points 0 points 0 points  Count back from 20 0 points 0 points 0 points 0 points  Months in reverse 2 points 0 points 0 points 0 points  Repeat phrase 2 points 0 points 0 points 0 points  Total Score 4 points 0 points 0 points 0 points    Immunizations Immunization History  Administered Date(s) Administered    Influenza, Seasonal, Injecte, Preservative Fre 01/18/2024   Influenza,inj,Quad PF,6+ Mos 06/17/2017, 06/21/2018, 06/09/2019   Influenza-Unspecified 08/24/2016, 06/21/2018, 07/12/2018   Moderna Covid-19 Fall Seasonal Vaccine 61yrs & older 01/18/2024   Moderna Sars-Covid-2 Vaccination 11/13/2019, 12/11/2019   Pneumococcal Polysaccharide-23 12/08/2016   Tdap 12/08/2016, 06/09/2019   Zoster Recombinant(Shingrix) 01/18/2024    Screening Tests Health Maintenance  Topic Date Due   Pneumococcal Vaccine: 50+ Years (2 of 2 - PCV) 12/08/2017   Colonoscopy  03/31/2021   OPHTHALMOLOGY EXAM  03/23/2023   Zoster Vaccines- Shingrix (2 of 2) 03/14/2024   COLON CANCER SCREENING ANNUAL FOBT  03/23/2024   Influenza Vaccine  01/21/2025 (Originally 05/24/2024)   FOOT EXAM  06/12/2025 (Originally 03/08/2023)   HEMOGLOBIN A1C  12/13/2024   Diabetic kidney evaluation - eGFR measurement  06/12/2025   Diabetic kidney evaluation - Urine ACR  06/12/2025  Medicare Annual Wellness (AWV)  08/06/2025   DTaP/Tdap/Td (3 - Td or Tdap) 06/08/2029   Hepatitis C Screening  Completed   HIV Screening  Completed   Hepatitis B Vaccines 19-59 Average Risk  Aged Out   HPV VACCINES  Aged Out   Meningococcal B Vaccine  Aged Out   COVID-19 Vaccine  Discontinued    Health Maintenance Items Addressed: Discussed the need to update pneumonia, shingles and flu vaccines.  Patient stated that he is in a wheelchair and is not able to do a colonoscopy because of the prep.   Additional Screening:  Vision Screening: Recommended annual ophthalmology exams for early detection of glaucoma and other disorders of the eye. Is the patient up to date with their annual eye exam?  Yes  Who is the provider or what is the name of the office in which the patient attends annual eye exams? [facility] and requested office notes be faxed   Dental Screening: Recommended annual dental exams for proper oral hygiene  Community  Resource Referral / Chronic Care Management: CRR required this visit?  No   CCM required this visit?  No   Plan:    I have personally reviewed and noted the following in the patient's chart:   Medical and social history Use of alcohol , tobacco or illicit drugs  Current medications and supplements including opioid prescriptions. Patient is not currently taking opioid prescriptions. Functional ability and status Nutritional status Physical activity Advanced directives List of other physicians Hospitalizations, surgeries, and ER visits in previous 12 months Vitals Screenings to include cognitive, depression, and falls Referrals and appointments  In addition, I have reviewed and discussed with patient certain preventive protocols, quality metrics, and best practice recommendations. A written personalized care plan for preventive services as well as general preventive health recommendations were provided to patient.   Angeline Fredericks, LPN   [id number]   After Visit Summary: (MyChart) Due to this being a telephonic visit, the after visit summary with patients personalized plan was offered to patient via MyChart   Notes: Nothing significant to report at this time.  Phone note to PCP

## 2024-08-06 NOTE — Patient Instructions (Signed)
 Mr. Michael Montgomery,  Thank you for taking the time for your Medicare Wellness Visit. I appreciate your continued commitment to your health goals. Please review the care plan we discussed, and feel free to reach out if I can assist you further.  Medicare recommends these wellness visits once per year to help you and your care team stay ahead of potential health issues. These visits are designed to focus on prevention, allowing your provider to concentrate on managing your acute and chronic conditions during your regular appointments.  Please note that Annual Wellness Visits do not include a physical exam. Some assessments may be limited, especially if the visit was conducted virtually. If needed, we may recommend a separate in-person follow-up with your provider.  Ongoing Care Seeing your primary care provider every 3 to 6 months helps us  monitor your health and provide consistent, personalized care.  Remember to update your flu and pneumonia vaccines. Make sure that you get your second shingles vaccine.   Referrals If a referral was made during today's visit and you haven't received any updates within two weeks, please contact the referred provider directly to check on the status.  Recommended Screenings:  Health Maintenance  Topic Date Due   Pneumococcal Vaccine for age over 55 (2 of 2 - PCV) 12/08/2017   Colon Cancer Screening  03/31/2021   Eye exam for diabetics  03/23/2023   Zoster (Shingles) Vaccine (2 of 2) 03/14/2024   Stool Blood Test  03/23/2024   Flu Shot  01/21/2025*   Complete foot exam   06/12/2025*   Hemoglobin A1C  12/13/2024   Yearly kidney function blood test for diabetes  06/12/2025   Yearly kidney health urinalysis for diabetes  06/12/2025   Medicare Annual Wellness Visit  08/06/2025   DTaP/Tdap/Td vaccine (3 - Td or Tdap) 06/08/2029   Hepatitis C Screening  Completed   HIV Screening  Completed   Hepatitis B Vaccine  Aged Out   HPV Vaccine  Aged Out   Meningitis B  Vaccine  Aged Out   COVID-19 Vaccine  Discontinued  *Topic was postponed. The date shown is not the original due date.       08/06/2024    8:34 AM  Advanced Directives  Does Patient Have a Medical Advance Directive? No  Would patient like information on creating a medical advance directive? No - Patient declined   Advance Care Planning is important because it: Ensures you receive medical care that aligns with your values, goals, and preferences. Provides guidance to your family and loved ones, reducing the emotional burden of decision-making during critical moments.  Vision: Annual vision screenings are recommended for early detection of glaucoma, cataracts, and diabetic retinopathy. These exams can also reveal signs of chronic conditions such as diabetes and high blood pressure.  Dental: Annual dental screenings help detect early signs of oral cancer, gum disease, and other conditions linked to overall health, including heart disease and diabetes.  Please see the attached documents for additional preventive care recommendations.

## 2024-08-15 DIAGNOSIS — Z9181 History of falling: Secondary | ICD-10-CM | POA: Diagnosis not present

## 2024-08-15 DIAGNOSIS — G6181 Chronic inflammatory demyelinating polyneuritis: Secondary | ICD-10-CM | POA: Diagnosis not present

## 2024-08-15 DIAGNOSIS — M48061 Spinal stenosis, lumbar region without neurogenic claudication: Secondary | ICD-10-CM | POA: Diagnosis not present

## 2024-08-30 ENCOUNTER — Ambulatory Visit: Admitting: Podiatry

## 2024-09-10 ENCOUNTER — Other Ambulatory Visit: Payer: Self-pay

## 2024-09-10 DIAGNOSIS — J301 Allergic rhinitis due to pollen: Secondary | ICD-10-CM

## 2024-09-11 ENCOUNTER — Telehealth (INDEPENDENT_AMBULATORY_CARE_PROVIDER_SITE_OTHER)

## 2024-09-11 VITALS — BP 128/75 | HR 82

## 2024-09-11 DIAGNOSIS — F321 Major depressive disorder, single episode, moderate: Secondary | ICD-10-CM

## 2024-09-11 DIAGNOSIS — E1129 Type 2 diabetes mellitus with other diabetic kidney complication: Secondary | ICD-10-CM | POA: Diagnosis not present

## 2024-09-11 DIAGNOSIS — Z7985 Long-term (current) use of injectable non-insulin antidiabetic drugs: Secondary | ICD-10-CM

## 2024-09-11 DIAGNOSIS — R195 Other fecal abnormalities: Secondary | ICD-10-CM

## 2024-09-11 DIAGNOSIS — L989 Disorder of the skin and subcutaneous tissue, unspecified: Secondary | ICD-10-CM

## 2024-09-11 DIAGNOSIS — R7989 Other specified abnormal findings of blood chemistry: Secondary | ICD-10-CM

## 2024-09-11 DIAGNOSIS — E1169 Type 2 diabetes mellitus with other specified complication: Secondary | ICD-10-CM | POA: Diagnosis not present

## 2024-09-11 DIAGNOSIS — I10 Essential (primary) hypertension: Secondary | ICD-10-CM | POA: Diagnosis not present

## 2024-09-11 DIAGNOSIS — E0843 Diabetes mellitus due to underlying condition with diabetic autonomic (poly)neuropathy: Secondary | ICD-10-CM

## 2024-09-11 DIAGNOSIS — G6181 Chronic inflammatory demyelinating polyneuritis: Secondary | ICD-10-CM

## 2024-09-11 DIAGNOSIS — R809 Proteinuria, unspecified: Secondary | ICD-10-CM

## 2024-09-11 DIAGNOSIS — L299 Pruritus, unspecified: Secondary | ICD-10-CM

## 2024-09-11 MED ORDER — TELMISARTAN 20 MG PO TABS: 20.0000 mg | ORAL_TABLET | Freq: Every day | ORAL | 2 refills | Status: AC

## 2024-09-11 MED ORDER — BLOOD PRESSURE CUFF MISC
1.0000 [IU] | Freq: Every day | 0 refills | Status: AC | PRN
Start: 2024-09-11 — End: ?

## 2024-09-11 MED ORDER — HYDROCORTISONE 1 % EX OINT: 1.0000 | TOPICAL_OINTMENT | Freq: Two times a day (BID) | CUTANEOUS | 1 refills | Status: AC | PRN

## 2024-09-11 NOTE — Assessment & Plan Note (Addendum)
 Chronic.  Continue Metformin  500 mg daily.  Suspect positive microalbuminuria secondary to diabetes, hypertension. Jardiance  caused DKA. If continues to have positive microalbuminuria restart ozempic  and referral to nephrologist. Continue Telmisartan  20 mg daily.

## 2024-09-11 NOTE — Assessment & Plan Note (Signed)
 Involving chin, neck. Recommend trial of Hydrocortisone  cream 1% BID prn for 1-2 weeks in a row, gentle preservative soap, reduce taking hot shower. Monitor rash for improvement or worsening. Recommend office visit if rash does not improve.

## 2024-09-11 NOTE — Assessment & Plan Note (Signed)
 Chronic, has been tolerating Telmisartan  20 mg daily which was started during his last office visit for hypertension as well as positive urine microalbuminuria.  Also on amlodipine  10 mg daily, hydrochlorothiazide  12.5 mg daily. Continue all medications without change. Recommend repeat BMP, check urine microalbumin level. Future lab ordered. Suspect positive microalbuminuria secondary to diabetes, hypertension. Jardiance  caused DKA. If continues to have positive microalbuminuria restart ozempic  and referral to nephrologist.

## 2024-09-11 NOTE — Progress Notes (Signed)
 Virtual Visit via Video Note  I connected with Michael Montgomery on 09/11/24 at  9:00 AM EST by a video enabled telemedicine application and verified that I am speaking with the correct person using two identifiers.  Patient Location: Home Provider Location: Office/Clinic  I discussed the limitations, risks, security, and privacy concerns of performing an evaluation and management service by video and the availability of in person appointments. I also discussed with the patient that there may be a patient responsible charge related to this service. The patient expressed understanding and agreed to proceed.  Subjective: PCP: Abbey Bruckner, MD  Chief Complaint  Patient presents with   Hypertension   Hyperlipidemia   Diabetes   Skin Problem    Patient has noticed recently that he has noticed some skin irritation around neck and knee area. Patient states someone said it looks like it could be the Shingles.    HPI Michael Montgomery is a 63 year old male with CIDP who presents via video visit for a follow-up.   He has been receiving IVIG treatment for CIDP, with the frequency recently reduced from every eight weeks to every three months. Follows up with neurologist Dr. Maree at [facility].  He can stand for short periods but not for long due to leg weakness. He had foot surgery to address a previous problem and plans to see his podiatrist soon.  He is currently taking Wellbutrin and does not recall being referred to psychiatry. No thoughts of self-harm or harm to others. He is also taking metformin  for diabetes, with blood sugar levels ranging from 89 to 130 mg/dL. He previously tried Jardiance  but experienced ketoacidosis, and he was on Ozempic , which he found effective for blood sugar control.  He has a history of fluctuating liver function tests and has an upcoming appointment with a gastroenterologist. He takes an 81 mg aspirin  daily and fish oil. No right-sided abdominal pain, belly  pain, or diarrhea.  He is on telmisartan  20 mg, amlodipine  10 mg, and hydrochlorothiazide  12.5 mg for blood pressure management. No chest pain and blood pressure has been stable at home. He has a history of proteinuria.  He experiences constipation and uses a laxative regularly. He has a history of a positive fecal occult test, which may have been due to constipation and straining. He plans to discuss colorectal cancer screening options with his gastroenterologist.   He has a history of diabetic retinopathy and recently had an eye exam, but the results were inconclusive regarding retinopathy. He follows up with his eye care provider every six months.  He reports a rash with itchy red spots around his neck and leg, possibly related to soap use. He has been prescribed hydrocortisone cream for the rash.   ROS: Per HPI  Current Outpatient Medications:    Accu-Chek Softclix Lancets lancets, 1 each by Other route daily. Check sugar fasting daily. Dx Code E11.9., Disp: 100 each, Rfl: 3   amLODipine  (NORVASC ) 10 MG tablet, Take 1 tablet (10 mg total) by mouth daily., Disp: 90 tablet, Rfl: 3   aspirin  EC 81 MG tablet, Take 81 mg by mouth daily. Swallow whole., Disp: , Rfl:    atorvastatin  (LIPITOR) 40 MG tablet, Take 1 tablet (40 mg total) by mouth daily., Disp: 90 tablet, Rfl: 3   Blood Glucose Monitoring Suppl (ACCU-CHEK GUIDE ME) w/Device KIT, USE ONCE DAILY FASTING. DX CODE E11.9., Disp: 1 kit, Rfl: 0   Blood Pressure Monitoring (BLOOD PRESSURE CUFF) MISC, 1  Units by Does not apply route daily as needed., Disp: 1 each, Rfl: 0   buPROPion (WELLBUTRIN XL) 300 MG 24 hr tablet, Take by mouth., Disp: , Rfl:    fluticasone  (FLONASE ) 50 MCG/ACT nasal spray, PLACE 1 SPRAY INTO BOTH NOSTRILS DAILY AS NEEDED FOR ALLERGIES OR RHINITIS., Disp: 48 mL, Rfl: 1   GAMUNEX-C 20 GM/200ML SOLN, , Disp: , Rfl:    glucose blood (ACCU-CHEK GUIDE TEST) test strip, USE ONCE DAILY FASTING, Disp: 100 each, Rfl: 12    hydrochlorothiazide  (MICROZIDE ) 12.5 MG capsule, Take 1 capsule (12.5 mg total) by mouth daily. MUST KEEP APPT FOR FURTHER REFILLS, Disp: 90 capsule, Rfl: 3   hydrocortisone  1 % ointment, Apply 1 Application topically 2 (two) times daily as needed for itching., Disp: 30 g, Rfl: 1   metFORMIN  (GLUCOPHAGE -XR) 500 MG 24 hr tablet, Take 1 tablet (500 mg total) by mouth daily with breakfast., Disp: 90 tablet, Rfl: 3   Multiple Vitamin (MULTIVITAMIN) capsule, Take 1 capsule by mouth daily., Disp: , Rfl:    Omega-3 Fatty Acids (FISH OIL PO), Take 1 capsule by mouth daily. , Disp: , Rfl:    sodium chloride  0.9 % infusion, Inject into the vein., Disp: , Rfl:    telmisartan  (MICARDIS ) 20 MG tablet, Take 1 tablet (20 mg total) by mouth daily., Disp: 90 tablet, Rfl: 2  Observations/Objective: Today's Vitals   09/11/24 0848  BP: 128/75  Pulse: 82   Physical Exam Vitals reviewed: Vitals reviewed, physical exam limited due to nature of video visit.  Constitutional:      General: He is not in acute distress. Neurological:     Mental Status: He is oriented to person, place, and time.  Psychiatric:        Mood and Affect: Mood normal.        Behavior: Behavior normal.     Assessment and Plan: Discussed pneumonia and shingles vaccinations given immunocompromised state.Shingles vaccine discussed with caution due to potential immune flare-up. Recommended Prevnar 20 pneumonia vaccine at local pharmacy. Discuss shingles vaccine with neurologist before administration.  Type 2 diabetes mellitus with other specified complication, without long-term current use of insulin  (HCC) -     Hemoglobin A1c; Future  Essential hypertension Assessment & Plan: Chronic, has been tolerating Telmisartan  20 mg daily which was started during his last office visit for hypertension as well as positive urine microalbuminuria.  Also on amlodipine  10 mg daily, hydrochlorothiazide  12.5 mg daily. Continue all medications without  change. Recommend repeat BMP, check urine microalbumin level. Future lab ordered. Suspect positive microalbuminuria secondary to diabetes, hypertension. Jardiance  caused DKA. If continues to have positive microalbuminuria restart ozempic  and referral to nephrologist.   Orders: -     Basic metabolic panel with GFR; Future -     Blood Pressure Cuff; 1 Units by Does not apply route daily as needed.  Dispense: 1 each; Refill: 0  Diabetes mellitus with microalbuminuria (HCC) -     Microalbumin / creatinine urine ratio; Future -     Telmisartan ; Take 1 tablet (20 mg total) by mouth daily.  Dispense: 90 tablet; Refill: 2  Pruritic skin lesion Assessment & Plan: Involving chin, neck. Recommend trial of Hydrocortisone  cream 1% BID prn for 1-2 weeks in a row, gentle preservative soap, reduce taking hot shower. Monitor rash for improvement or worsening. Recommend office visit if rash does not improve.   Orders: -     Hydrocortisone ; Apply 1 Application topically 2 (two) times daily as needed for  itching.  Dispense: 30 g; Refill: 1  Positive fecal occult blood test Assessment & Plan: As evident on lab on 03/24/2023 patient was referred to GI by his previous PCP. Has elevated LFTs since he has COVID 19 in 2020. He has upcoming GI appointment in 10/2024. No abdominal pain but has a h/o chronic constipation which could have contributed to positive fecal occult blood test. Due to patient's medical history, being wheel chair bound he is not a good candidate for colonoscopy at this time. Discussed repeat fecal occult test, cologuard, risk of false positive test. Recommend keep appointment with GI to discuss best course of action for multiple GI concerns.   CIDP (chronic inflammatory demyelinating polyneuropathy) (HCC) Assessment & Plan: Chronic, paraplegia, b/l lower leg weakness with muscle mass loss, wheelchair bound due to this.  Recommend updating pneumonia immunization.  Recommend discuss updating  shingles immunization with Dr. Maree.  Reviewed visit note from his visit with neurologist Dr. Maree visit from 08/21/24 for CIDP (dx on 08/2019). Reducing frequency of IVIG from every 8 weeks to every 3 months. Dr. Maree provided printed prescription for a shower wheelchair as well as printed prescription for physical therapy for physical deconditioning.  Encourage participate in physical activity to increase strength.    Elevated LFTs Assessment & Plan: Elevated LFTs likely secondary to ongoing muscle mass loss from neurological condition.  Plan per positive fecal occult blood test from today's visit.    Diabetes mellitus due to underlying condition with diabetic autonomic neuropathy, without long-term current use of insulin  [facility]) Assessment & Plan: Chronic.  Continue Metformin  500 mg daily.  Suspect positive microalbuminuria secondary to diabetes, hypertension. Jardiance  caused DKA. If continues to have positive microalbuminuria restart ozempic  and referral to nephrologist. Continue Telmisartan  20 mg daily.    Depression, major, single episode, moderate (HCC) Assessment & Plan: No SI/HI continue Bupropion 300 mg daily as prescribed by neurologist.      Follow Up Instructions: Return in about 3 months (around 12/12/2024) for Follow up with Dr. Abbey and labs only appointment anytime after 09/12/24 (non-fasting).   I discussed the assessment and treatment plan with the patient. The patient was provided an opportunity to ask questions, and all were answered. The patient agreed with the plan and demonstrated an understanding of the instructions.   The patient was advised to call back or seek an in-person evaluation if the symptoms worsen or if the condition fails to improve as anticipated.  The above assessment and management plan was discussed with the patient. The patient verbalized understanding of and has agreed to the management plan.   Luke Abbey, MD

## 2024-09-11 NOTE — Assessment & Plan Note (Signed)
 As evident on lab on 03/24/2023 patient was referred to GI by his previous PCP. Has elevated LFTs since he has COVID 19 in 2020. He has upcoming GI appointment in 10/2024. No abdominal pain but has a h/o chronic constipation which could have contributed to positive fecal occult blood test. Due to patient's medical history, being wheel chair bound he is not a good candidate for colonoscopy at this time. Discussed repeat fecal occult test, cologuard, risk of false positive test. Recommend keep appointment with GI to discuss best course of action for multiple GI concerns.

## 2024-09-11 NOTE — Assessment & Plan Note (Addendum)
 Elevated LFTs likely secondary to ongoing muscle mass loss from neurological condition.  Plan per positive fecal occult blood test from today's visit.

## 2024-09-11 NOTE — Assessment & Plan Note (Signed)
 No SI/HI continue Bupropion 300 mg daily as prescribed by neurologist.

## 2024-09-11 NOTE — Assessment & Plan Note (Addendum)
 Chronic, paraplegia, b/l lower leg weakness with muscle mass loss, wheelchair bound due to this.  Recommend updating pneumonia immunization.  Recommend discuss updating shingles immunization with Dr. Maree.  Reviewed visit note from his visit with neurologist Dr. Maree visit from 08/21/24 for CIDP (dx on 08/2019). Reducing frequency of IVIG from every 8 weeks to every 3 months. Dr. Maree provided printed prescription for a shower wheelchair as well as printed prescription for physical therapy for physical deconditioning.  Encourage participate in physical activity to increase strength.

## 2024-09-12 ENCOUNTER — Ambulatory Visit (INDEPENDENT_AMBULATORY_CARE_PROVIDER_SITE_OTHER): Admitting: Podiatry

## 2024-09-12 ENCOUNTER — Encounter: Payer: Self-pay | Admitting: Podiatry

## 2024-09-12 DIAGNOSIS — M79609 Pain in unspecified limb: Secondary | ICD-10-CM | POA: Diagnosis not present

## 2024-09-12 DIAGNOSIS — B351 Tinea unguium: Secondary | ICD-10-CM | POA: Diagnosis not present

## 2024-09-12 DIAGNOSIS — E119 Type 2 diabetes mellitus without complications: Secondary | ICD-10-CM

## 2024-09-21 NOTE — Progress Notes (Signed)
  Subjective:  Patient ID: Michael Montgomery, male    DOB: 1961/09/17,  MRN: [id number]  Michael Montgomery presents to clinic today for at risk foot care with history of diabetic neuropathy and painful mycotic toenails of both feet that are difficult to trim. Pain interferes with daily activities and wearing enclosed shoe gear comfortably.  Chief Complaint  Patient presents with   Nail Problem    Thick painful toenails, 3 month follow up Medicaid ABN signed today   Hammer Toe   Diabetes    A1C 5.6   New problem(s): None.   PCP is Bair, Luke, MD.  No Known Allergies  Review of Systems: Negative except as noted in the HPI.  Objective: No changes noted in today's physical examination. There were no vitals filed for this visit. Michael Montgomery is a pleasant 63 y.o. male WD, WN in NAD. AAO x 3.  Vascular Examination: Capillary refill time immediate b/l. Palpable pedal pulses. Pedal hair absent b/l. Pedal edema trace b/l. No pain with calf compression b/l. Skin temperature gradient WNL b/l. No cyanosis or clubbing b/l. No ischemia or gangrene noted b/l.   Neurological Examination: Sensation grossly intact b/l with 10 gram monofilament.  Dermatological Examination: Well healed surgical scar(s) noted dorsa hallux IPJ left and dorsal left 2nd digitl .  Pedal skin with normal turgor, texture and tone b/l.  No open wounds. No interdigital macerations.   Toenails 1-5 b/l thick, discolored, elongated with subungual debris and pain on dorsal palpation.   No hyperkeratotic nor porokeratotic lesions.  Musculoskeletal Examination: Muscle strength 5/5 to all lower extremity muscle groups bilaterally. No pain, crepitus or joint limitation noted with ROM bilateral LE. Utilizes motorized chair for mobility assistance.  Radiographs: None  Last A1c:      Latest Ref Rng & Units 06/12/2024   11:49 AM 02/19/2024   12:00 PM  Hemoglobin A1C  Hemoglobin-A1c 4.6 - 6.5 % 5.6  4.8     Assessment/Plan: 1. Pain due to onychomycosis of nail   2. Diabetes mellitus without complication (HCC)   -Patient was evaluated today. All questions/concerns addressed on today's visit. -Continue foot and shoe inspections daily. Monitor blood glucose per PCP/Endocrinologist's recommendations. -Patient to continue soft, supportive shoe gear daily. -Toenails 1-5 bilaterally were debrided in length and girth with sterile nail nippers and dremel. Pinpoint bleeding of distal tip of left 2nd toe addressed with Lumicain Hemostatic Solution, cleansed with alcohol . triple antibiotic ointment applied. No further treatment required by patient/caregiver. -Patient/POA to call should there be question/concern in the interim.   Return in about 3 months (around 12/13/2024).  Michael Montgomery, DPM      [facility] LOCATION: [address], [place] [address]                   Office [phone]   [facility] LOCATION: [address] [place], [place] [address] Office [phone]

## 2024-09-23 ENCOUNTER — Other Ambulatory Visit

## 2024-10-03 ENCOUNTER — Other Ambulatory Visit

## 2024-10-03 DIAGNOSIS — E1169 Type 2 diabetes mellitus with other specified complication: Secondary | ICD-10-CM

## 2024-10-03 DIAGNOSIS — I10 Essential (primary) hypertension: Secondary | ICD-10-CM

## 2024-10-03 DIAGNOSIS — E1129 Type 2 diabetes mellitus with other diabetic kidney complication: Secondary | ICD-10-CM

## 2024-10-03 LAB — BASIC METABOLIC PANEL WITH GFR
BUN: 14 mg/dL (ref 6–23)
CO2: 29 meq/L (ref 19–32)
Calcium: 9.5 mg/dL (ref 8.4–10.5)
Chloride: 98 meq/L (ref 96–112)
Creatinine, Ser: 0.48 mg/dL (ref 0.40–1.50)
GFR: 111.35 mL/min (ref >60.00)
Glucose, Bld: 158 mg/dL — ABNORMAL HIGH (ref 70–99)
Potassium: 4.2 meq/L (ref 3.5–5.1)
Sodium: 134 meq/L — ABNORMAL LOW (ref 135–145)

## 2024-10-03 LAB — HEMOGLOBIN A1C: Hgb A1c MFr Bld: 4.9 % (ref 4.6–6.5)

## 2024-10-03 NOTE — Addendum Note (Signed)
 Addended by: TANDA HARVEY D on: 10/03/2024 10:18 AM   Modules accepted: Orders

## 2024-10-04 ENCOUNTER — Ambulatory Visit: Payer: Self-pay

## 2024-10-04 DIAGNOSIS — E871 Hypo-osmolality and hyponatremia: Secondary | ICD-10-CM | POA: Insufficient documentation

## 2024-11-04 NOTE — Progress Notes (Signed)
 KARIM AIELLO                                          MRN: [id number]   11/04/2024   The [facility] Quality Team Specialist reviewed this patient medical record for the purposes of chart review for care gap closure. The following were reviewed: abstraction for care gap closure-glycemic status assessment.    [facility] Quality Team

## 2024-11-21 ENCOUNTER — Other Ambulatory Visit

## 2024-12-04 ENCOUNTER — Other Ambulatory Visit

## 2024-12-19 ENCOUNTER — Ambulatory Visit

## 2024-12-20 ENCOUNTER — Ambulatory Visit: Admitting: Podiatry

## 2025-08-11 ENCOUNTER — Ambulatory Visit
